# Patient Record
Sex: Male | Born: 1960 | Race: Black or African American | Hispanic: No | Smoking: Current some day smoker
Health system: Southern US, Community
[De-identification: ages and names within clinical notes are randomized; demographics above are authoritative.]

## PROBLEM LIST (undated history)

## (undated) ENCOUNTER — Ambulatory Visit (HOSPITAL_COMMUNITY): Admission: EM

## (undated) DIAGNOSIS — Z85038 Personal history of other malignant neoplasm of large intestine: Secondary | ICD-10-CM

## (undated) DIAGNOSIS — E7800 Pure hypercholesterolemia, unspecified: Secondary | ICD-10-CM

## (undated) DIAGNOSIS — M1990 Unspecified osteoarthritis, unspecified site: Secondary | ICD-10-CM

## (undated) DIAGNOSIS — H409 Unspecified glaucoma: Secondary | ICD-10-CM

## (undated) DIAGNOSIS — F1910 Other psychoactive substance abuse, uncomplicated: Secondary | ICD-10-CM

## (undated) DIAGNOSIS — E119 Type 2 diabetes mellitus without complications: Secondary | ICD-10-CM

## (undated) DIAGNOSIS — C189 Malignant neoplasm of colon, unspecified: Secondary | ICD-10-CM

## (undated) DIAGNOSIS — I10 Essential (primary) hypertension: Secondary | ICD-10-CM

## (undated) DIAGNOSIS — H269 Unspecified cataract: Secondary | ICD-10-CM

## (undated) DIAGNOSIS — T7840XA Allergy, unspecified, initial encounter: Secondary | ICD-10-CM

## (undated) HISTORY — DX: Allergy, unspecified, initial encounter: T78.40XA

## (undated) HISTORY — PX: KNEE ARTHROSCOPY: SUR90

## (undated) HISTORY — DX: Personal history of other malignant neoplasm of large intestine: Z85.038

## (undated) HISTORY — DX: Unspecified cataract: H26.9

## (undated) HISTORY — DX: Other psychoactive substance abuse, uncomplicated: F19.10

## (undated) HISTORY — DX: Unspecified glaucoma: H40.9

## (undated) HISTORY — PX: COLON SURGERY: SHX602

## (undated) HISTORY — PX: OTHER SURGICAL HISTORY: SHX169

---

## 2002-12-24 DIAGNOSIS — C189 Malignant neoplasm of colon, unspecified: Secondary | ICD-10-CM | POA: Insufficient documentation

## 2002-12-24 DIAGNOSIS — Z85038 Personal history of other malignant neoplasm of large intestine: Secondary | ICD-10-CM

## 2002-12-24 HISTORY — DX: Personal history of other malignant neoplasm of large intestine: Z85.038

## 2008-07-16 ENCOUNTER — Emergency Department (HOSPITAL_COMMUNITY): Admission: EM | Admit: 2008-07-16 | Discharge: 2008-07-16 | Payer: Self-pay | Admitting: Emergency Medicine

## 2008-10-31 ENCOUNTER — Emergency Department (HOSPITAL_COMMUNITY): Admission: EM | Admit: 2008-10-31 | Discharge: 2008-10-31 | Payer: Self-pay | Admitting: Emergency Medicine

## 2008-11-09 ENCOUNTER — Ambulatory Visit: Payer: Self-pay | Admitting: Nurse Practitioner

## 2008-11-09 DIAGNOSIS — D649 Anemia, unspecified: Secondary | ICD-10-CM | POA: Insufficient documentation

## 2008-11-09 DIAGNOSIS — B351 Tinea unguium: Secondary | ICD-10-CM | POA: Insufficient documentation

## 2008-11-09 DIAGNOSIS — E119 Type 2 diabetes mellitus without complications: Secondary | ICD-10-CM | POA: Insufficient documentation

## 2008-11-09 DIAGNOSIS — I10 Essential (primary) hypertension: Secondary | ICD-10-CM | POA: Insufficient documentation

## 2008-11-09 LAB — CONVERTED CEMR LAB
ALT: 14 units/L (ref 0–53)
AST: 14 units/L (ref 0–37)
Albumin: 4.4 g/dL (ref 3.5–5.2)
Alkaline Phosphatase: 72 units/L (ref 39–117)
BUN: 24 mg/dL — ABNORMAL HIGH (ref 6–23)
Basophils Absolute: 0 10*3/uL (ref 0.0–0.1)
Basophils Relative: 1 % (ref 0–1)
Blood Glucose, Fingerstick: 132
CO2: 22 meq/L (ref 19–32)
Calcium: 9.6 mg/dL (ref 8.4–10.5)
Chloride: 104 meq/L (ref 96–112)
Creatinine, Ser: 1.42 mg/dL (ref 0.40–1.50)
Eosinophils Absolute: 0.2 10*3/uL (ref 0.0–0.7)
Eosinophils Relative: 3 % (ref 0–5)
Glucose, Bld: 107 mg/dL — ABNORMAL HIGH (ref 70–99)
HCT: 38 % — ABNORMAL LOW (ref 39.0–52.0)
Hemoglobin: 12.3 g/dL — ABNORMAL LOW (ref 13.0–17.0)
Hgb A1c MFr Bld: 7.1 %
Lymphocytes Relative: 41 % (ref 12–46)
Lymphs Abs: 2.3 10*3/uL (ref 0.7–4.0)
MCHC: 32.4 g/dL (ref 30.0–36.0)
MCV: 81.4 fL (ref 78.0–100.0)
Monocytes Absolute: 0.4 10*3/uL (ref 0.1–1.0)
Monocytes Relative: 7 % (ref 3–12)
Neutro Abs: 2.7 10*3/uL (ref 1.7–7.7)
Neutrophils Relative %: 49 % (ref 43–77)
Platelets: 294 10*3/uL (ref 150–400)
Potassium: 4.3 meq/L (ref 3.5–5.3)
RBC: 4.67 M/uL (ref 4.22–5.81)
RDW: 14.9 % (ref 11.5–15.5)
Sodium: 141 meq/L (ref 135–145)
TSH: 1.002 microintl units/mL (ref 0.350–4.50)
Total Bilirubin: 0.4 mg/dL (ref 0.3–1.2)
Total Protein: 7.4 g/dL (ref 6.0–8.3)
WBC: 5.6 10*3/uL (ref 4.0–10.5)

## 2008-11-22 ENCOUNTER — Ambulatory Visit: Payer: Self-pay | Admitting: *Deleted

## 2008-12-08 ENCOUNTER — Ambulatory Visit: Payer: Self-pay | Admitting: Nurse Practitioner

## 2008-12-08 DIAGNOSIS — K219 Gastro-esophageal reflux disease without esophagitis: Secondary | ICD-10-CM | POA: Insufficient documentation

## 2008-12-08 DIAGNOSIS — F528 Other sexual dysfunction not due to a substance or known physiological condition: Secondary | ICD-10-CM | POA: Insufficient documentation

## 2008-12-08 LAB — CONVERTED CEMR LAB
Bilirubin Urine: NEGATIVE
Blood Glucose, AC Bkfst: 94 mg/dL
Blood in Urine, dipstick: NEGATIVE
Glucose, Urine, Semiquant: NEGATIVE
Ketones, urine, test strip: NEGATIVE
Nitrite: NEGATIVE
Protein, U semiquant: NEGATIVE
Specific Gravity, Urine: 1.02
Urobilinogen, UA: 0.2
pH: 5

## 2008-12-10 ENCOUNTER — Encounter (INDEPENDENT_AMBULATORY_CARE_PROVIDER_SITE_OTHER): Payer: Self-pay | Admitting: Nurse Practitioner

## 2008-12-10 DIAGNOSIS — E291 Testicular hypofunction: Secondary | ICD-10-CM | POA: Insufficient documentation

## 2008-12-10 LAB — CONVERTED CEMR LAB
Cholesterol: 198 mg/dL (ref 0–200)
HDL: 55 mg/dL (ref >39)
LDL Cholesterol: 120 mg/dL — ABNORMAL HIGH (ref 0–99)
Microalb, Ur: 0.46 mg/dL (ref 0.00–1.89)
PSA: 0.53 ng/mL (ref 0.10–4.00)
Sex Hormone Binding: 12 nmol/L — ABNORMAL LOW (ref 13–71)
Testosterone Free: 68.3 pg/mL (ref 47.0–244.0)
Testosterone-% Free: 3 % — ABNORMAL HIGH (ref 1.6–2.9)
Testosterone: 226.57 ng/dL — ABNORMAL LOW (ref 350–890)
Total CHOL/HDL Ratio: 3.6
Triglycerides: 114 mg/dL (ref <150)
VLDL: 23 mg/dL (ref 0–40)

## 2008-12-30 ENCOUNTER — Ambulatory Visit: Payer: Self-pay | Admitting: Nurse Practitioner

## 2008-12-31 DIAGNOSIS — E7800 Pure hypercholesterolemia, unspecified: Secondary | ICD-10-CM | POA: Insufficient documentation

## 2008-12-31 LAB — CONVERTED CEMR LAB
ALT: 16 units/L (ref 0–53)
AST: 12 units/L (ref 0–37)
Albumin: 4.3 g/dL (ref 3.5–5.2)
Alkaline Phosphatase: 75 units/L (ref 39–117)
Basophils Absolute: 0 10*3/uL (ref 0.0–0.1)
Basophils Relative: 1 % (ref 0–1)
Bilirubin, Direct: 0.1 mg/dL (ref 0.0–0.3)
Eosinophils Absolute: 0.1 10*3/uL (ref 0.0–0.7)
Eosinophils Relative: 2 % (ref 0–5)
HCT: 34.9 % — ABNORMAL LOW (ref 39.0–52.0)
Hemoglobin: 11.5 g/dL — ABNORMAL LOW (ref 13.0–17.0)
Indirect Bilirubin: 0.2 mg/dL (ref 0.0–0.9)
Lymphocytes Relative: 41 % (ref 12–46)
Lymphs Abs: 2.6 10*3/uL (ref 0.7–4.0)
MCHC: 33 g/dL (ref 30.0–36.0)
MCV: 78.6 fL (ref 78.0–100.0)
Monocytes Absolute: 0.5 10*3/uL (ref 0.1–1.0)
Monocytes Relative: 8 % (ref 3–12)
Neutro Abs: 3.1 10*3/uL (ref 1.7–7.7)
Neutrophils Relative %: 48 % (ref 43–77)
Platelets: 297 10*3/uL (ref 150–400)
RBC: 4.44 M/uL (ref 4.22–5.81)
RDW: 15 % (ref 11.5–15.5)
Total Bilirubin: 0.3 mg/dL (ref 0.3–1.2)
Total Protein: 7.1 g/dL (ref 6.0–8.3)
WBC: 6.4 10*3/uL (ref 4.0–10.5)

## 2009-01-07 ENCOUNTER — Encounter (INDEPENDENT_AMBULATORY_CARE_PROVIDER_SITE_OTHER): Payer: Self-pay | Admitting: Nurse Practitioner

## 2009-01-11 ENCOUNTER — Telehealth (INDEPENDENT_AMBULATORY_CARE_PROVIDER_SITE_OTHER): Payer: Self-pay | Admitting: Nurse Practitioner

## 2009-02-24 ENCOUNTER — Telehealth (INDEPENDENT_AMBULATORY_CARE_PROVIDER_SITE_OTHER): Payer: Self-pay | Admitting: Nurse Practitioner

## 2009-03-24 ENCOUNTER — Ambulatory Visit: Payer: Self-pay | Admitting: Nurse Practitioner

## 2009-03-24 LAB — CONVERTED CEMR LAB
Blood Glucose, Fingerstick: 156
Cholesterol, target level: 200 mg/dL
HDL goal, serum: 40 mg/dL
Hgb A1c MFr Bld: 7.7 %
LDL Goal: 100 mg/dL

## 2009-03-31 ENCOUNTER — Encounter (INDEPENDENT_AMBULATORY_CARE_PROVIDER_SITE_OTHER): Payer: Self-pay | Admitting: Nurse Practitioner

## 2009-05-05 ENCOUNTER — Ambulatory Visit: Payer: Self-pay | Admitting: Nurse Practitioner

## 2009-05-06 LAB — CONVERTED CEMR LAB: Testosterone: 265.28 ng/dL — ABNORMAL LOW (ref 350–890)

## 2009-05-16 ENCOUNTER — Ambulatory Visit: Payer: Self-pay | Admitting: Nurse Practitioner

## 2009-07-01 ENCOUNTER — Telehealth (INDEPENDENT_AMBULATORY_CARE_PROVIDER_SITE_OTHER): Payer: Self-pay | Admitting: Nurse Practitioner

## 2009-07-29 ENCOUNTER — Telehealth (INDEPENDENT_AMBULATORY_CARE_PROVIDER_SITE_OTHER): Payer: Self-pay | Admitting: Nurse Practitioner

## 2009-08-12 ENCOUNTER — Ambulatory Visit: Payer: Self-pay | Admitting: Nurse Practitioner

## 2009-08-12 LAB — CONVERTED CEMR LAB: Blood Glucose, Fingerstick: 132

## 2009-08-15 LAB — CONVERTED CEMR LAB
ALT: 20 units/L (ref 0–53)
AST: 18 units/L (ref 0–37)
Albumin: 4.5 g/dL (ref 3.5–5.2)
Alkaline Phosphatase: 90 units/L (ref 39–117)
BUN: 20 mg/dL (ref 6–23)
CO2: 23 meq/L (ref 19–32)
Calcium: 9.6 mg/dL (ref 8.4–10.5)
Chloride: 104 meq/L (ref 96–112)
Cholesterol: 158 mg/dL (ref 0–200)
Creatinine, Ser: 1.17 mg/dL (ref 0.40–1.50)
Glucose, Bld: 130 mg/dL — ABNORMAL HIGH (ref 70–99)
HDL: 42 mg/dL (ref >39)
Hgb A1c MFr Bld: 7.2 % — ABNORMAL HIGH (ref 4.6–6.1)
LDL Cholesterol: 100 mg/dL — ABNORMAL HIGH (ref 0–99)
Potassium: 4.4 meq/L (ref 3.5–5.3)
Sodium: 140 meq/L (ref 135–145)
Total Bilirubin: 0.5 mg/dL (ref 0.3–1.2)
Total CHOL/HDL Ratio: 3.8
Total Protein: 7.7 g/dL (ref 6.0–8.3)
Triglycerides: 82 mg/dL (ref <150)
VLDL: 16 mg/dL (ref 0–40)

## 2009-08-16 ENCOUNTER — Encounter (INDEPENDENT_AMBULATORY_CARE_PROVIDER_SITE_OTHER): Payer: Self-pay | Admitting: Nurse Practitioner

## 2009-08-22 LAB — CONVERTED CEMR LAB: Testosterone: 240.63 ng/dL — ABNORMAL LOW (ref 350–890)

## 2009-08-25 ENCOUNTER — Telehealth (INDEPENDENT_AMBULATORY_CARE_PROVIDER_SITE_OTHER): Payer: Self-pay | Admitting: Nurse Practitioner

## 2009-09-01 ENCOUNTER — Encounter (INDEPENDENT_AMBULATORY_CARE_PROVIDER_SITE_OTHER): Payer: Self-pay | Admitting: *Deleted

## 2009-09-14 ENCOUNTER — Telehealth (INDEPENDENT_AMBULATORY_CARE_PROVIDER_SITE_OTHER): Payer: Self-pay | Admitting: Nurse Practitioner

## 2009-09-27 ENCOUNTER — Telehealth (INDEPENDENT_AMBULATORY_CARE_PROVIDER_SITE_OTHER): Payer: Self-pay | Admitting: Nurse Practitioner

## 2009-11-21 ENCOUNTER — Telehealth (INDEPENDENT_AMBULATORY_CARE_PROVIDER_SITE_OTHER): Payer: Self-pay | Admitting: Nurse Practitioner

## 2010-02-03 ENCOUNTER — Ambulatory Visit: Payer: Self-pay | Admitting: Nurse Practitioner

## 2010-02-03 LAB — CONVERTED CEMR LAB
ALT: 23 units/L (ref 0–53)
AST: 16 units/L (ref 0–37)
Albumin: 4.5 g/dL (ref 3.5–5.2)
Alkaline Phosphatase: 94 units/L (ref 39–117)
BUN: 23 mg/dL (ref 6–23)
Basophils Absolute: 0 10*3/uL (ref 0.0–0.1)
Basophils Relative: 0 % (ref 0–1)
Bilirubin Urine: NEGATIVE
Blood Glucose, Fingerstick: 112
Blood in Urine, dipstick: NEGATIVE
CO2: 26 meq/L (ref 19–32)
Calcium: 9.6 mg/dL (ref 8.4–10.5)
Chloride: 103 meq/L (ref 96–112)
Cholesterol: 166 mg/dL (ref 0–200)
Creatinine, Ser: 1.36 mg/dL (ref 0.40–1.50)
Eosinophils Absolute: 0.2 10*3/uL (ref 0.0–0.7)
Eosinophils Relative: 2 % (ref 0–5)
Glucose, Bld: 104 mg/dL — ABNORMAL HIGH (ref 70–99)
Glucose, Urine, Semiquant: NEGATIVE
HCT: 40.9 % (ref 39.0–52.0)
HDL: 40 mg/dL (ref >39)
Hemoglobin: 13.2 g/dL (ref 13.0–17.0)
Hgb A1c MFr Bld: 7.4 % — ABNORMAL HIGH (ref 4.6–6.1)
Ketones, urine, test strip: NEGATIVE
LDL Cholesterol: 109 mg/dL — ABNORMAL HIGH (ref 0–99)
Lymphocytes Relative: 27 % (ref 12–46)
Lymphs Abs: 2.3 10*3/uL (ref 0.7–4.0)
MCHC: 32.3 g/dL (ref 30.0–36.0)
MCV: 82.6 fL (ref 78.0–100.0)
Microalb, Ur: 0.5 mg/dL (ref 0.00–1.89)
Monocytes Absolute: 0.5 10*3/uL (ref 0.1–1.0)
Monocytes Relative: 6 % (ref 3–12)
Neutro Abs: 5.5 10*3/uL (ref 1.7–7.7)
Neutrophils Relative %: 64 % (ref 43–77)
Nitrite: NEGATIVE
PSA: 0.67 ng/mL (ref 0.10–4.00)
Platelets: 318 10*3/uL (ref 150–400)
Potassium: 4.5 meq/L (ref 3.5–5.3)
Protein, U semiquant: NEGATIVE
RBC: 4.95 M/uL (ref 4.22–5.81)
RDW: 16.3 % — ABNORMAL HIGH (ref 11.5–15.5)
Sex Hormone Binding: 24 nmol/L (ref 13–71)
Sodium: 141 meq/L (ref 135–145)
Specific Gravity, Urine: 1.015
TSH: 0.463 microintl units/mL (ref 0.350–4.500)
Testosterone Free: 67.6 pg/mL (ref 47.0–244.0)
Testosterone-% Free: 2.3 % (ref 1.6–2.9)
Testosterone: 290.03 ng/dL — ABNORMAL LOW (ref 350–890)
Total Bilirubin: 0.5 mg/dL (ref 0.3–1.2)
Total CHOL/HDL Ratio: 4.2
Total Protein: 7.4 g/dL (ref 6.0–8.3)
Triglycerides: 83 mg/dL (ref <150)
Urobilinogen, UA: 0.2
VLDL: 17 mg/dL (ref 0–40)
WBC Urine, dipstick: NEGATIVE
WBC: 8.6 10*3/uL (ref 4.0–10.5)
pH: 5

## 2010-02-20 ENCOUNTER — Telehealth (INDEPENDENT_AMBULATORY_CARE_PROVIDER_SITE_OTHER): Payer: Self-pay | Admitting: Nurse Practitioner

## 2010-05-05 ENCOUNTER — Telehealth (INDEPENDENT_AMBULATORY_CARE_PROVIDER_SITE_OTHER): Payer: Self-pay | Admitting: Nurse Practitioner

## 2010-06-28 ENCOUNTER — Telehealth (INDEPENDENT_AMBULATORY_CARE_PROVIDER_SITE_OTHER): Payer: Self-pay | Admitting: Nurse Practitioner

## 2010-06-30 ENCOUNTER — Ambulatory Visit: Payer: Self-pay | Admitting: Nurse Practitioner

## 2010-07-03 LAB — CONVERTED CEMR LAB: Hgb A1c MFr Bld: 8.2 % — ABNORMAL HIGH (ref <5.7)

## 2010-09-04 ENCOUNTER — Telehealth (INDEPENDENT_AMBULATORY_CARE_PROVIDER_SITE_OTHER): Payer: Self-pay | Admitting: Nurse Practitioner

## 2010-09-05 ENCOUNTER — Telehealth (INDEPENDENT_AMBULATORY_CARE_PROVIDER_SITE_OTHER): Payer: Self-pay | Admitting: Nurse Practitioner

## 2011-01-23 NOTE — Progress Notes (Signed)
Summary: NEEDS VIAGRA REFILLED  Phone Note Call from Patient Call back at Home Phone [phone]   Reason for Call: Refill Medication Summary of Call: [name] PT. MR Kwasnik SAYS THAT HE NEEDS A REFILL ON HIS VIAGRA, AND WANTS TO KNOW IF HE CAN PICK THE SCRIPT UP AROUND NEXT FRIDAY. Initial call taken by: Leodis Rains,  May 05, 2010 11:34 AM  Follow-up for Phone Call        forward to N. Daphine Deutscher, FNP Follow-up by: Levon Hedger,  May 05, 2010 2:30 PM  Additional Follow-up for Phone Call Additional follow up Details #1::        looks like Viagra was refilled on 05/05/2010 by [facility] and sent to [facility] pharmacy.  Additional Follow-up by: Lehman Prom FNP,  May 08, 2010 8:34 AM    Additional Follow-up for Phone Call Additional follow up Details #2::    pt informed of above information. Follow-up by: Levon Hedger,  May 09, 2010 5:08 PM

## 2011-01-23 NOTE — Miscellaneous (Signed)
Summary: Med change - Androgel  Clinical Lists Changes  Medications: Changed medication from ANDRODERM 5 MG/24HR PT24 (TESTOSTERONE) apply patch nightly to clean dry area to ANDROGEL 50 MG/5GM GEL (TESTOSTERONE) Apply daily to clean, dry, intact skin of  shoudlers, upper arms or abdomen - Signed Rx of ANDROGEL 50 MG/5GM GEL (TESTOSTERONE) Apply daily to clean, dry, intact skin of  shoudlers, upper arms or abdomen;  #1 month qs x 5;  Signed;  Entered by: Lehman Prom FNP;  Authorized by: Lehman Prom FNP;  Method used: Printed then faxed to [facility], [address], [place], [place]  [address], Ph: [phone], Fax: [phone]    Prescriptions: ANDROGEL 50 MG/5GM GEL (TESTOSTERONE) Apply daily to clean, dry, intact skin of  shoudlers, upper arms or abdomen  #1 month qs x 5   Entered and Authorized by:   Lehman Prom FNP   Signed by:   Lehman Prom FNP on 01/07/2009   Method used:   Printed then faxed to ...       [facility] (retail)       [address] [place], [place]  [address]       Ph: [phone]       Fax: [phone]   RxID:   [id number]

## 2011-01-23 NOTE — Progress Notes (Signed)
Summary: Refills of Viagra/cialis  Phone Note Call from Patient   Reason for Call: Refill Medication Complaint: Cough/Sore throat Summary of Call: NEEDS HIS CIALIS CALLEDINTO DeWitt CALLED IN TO [facility]//PHONE [phone] Initial call taken by: Arta Bruce,  September 04, 2010 8:15 AM  Follow-up for Phone Call        viagra and pravastatin  sent to [facility] pharmacy as per request received from the pharmacy notify pt Follow-up by: Lehman Prom FNP,  September 04, 2010 6:49 PM  Additional Follow-up for Phone Call Additional follow up Details #1::        pt is aware.Marland KitchenMarland KitchenWeyman Croon says [facility] doesnt care viagra... pt wants the  cilas  to go to [facility] on [address]. pt says he doesnt need that many pills per month Additional Follow-up by: [place] Shannon,  September 05, 2010 12:47 PM    Additional Follow-up for Phone Call Additional follow up Details #2::    Call pharmacy and see if they have viagra for this pt.  they sent a refill request for viagra and that is why i sent the refill to them if they have it, call pt back and let him know. he understands that they are using a supply from pt assistance and when that supply completed then he would need an Rx sent to [facility] but as long as [facility] has some viagra he can  use this. He can't get both so staff here needs to confirm Follow-up by: Lehman Prom FNP,  September 05, 2010 6:32 PM  Additional Follow-up for Phone Call Additional follow up Details #3:: Details for Additional Follow-up Action Taken: Spoke with Danella Deis at [facility] -- they no longer have the viagra program, so he needs to get his ED meds elsewhere.  Dutch Quint RN  September 06, 2010 9:58 AM  Ok. I just looked at [facility] list and levitra (which is another erectile dysfunction med like viagra and cialis) is available there for $9.  If he would like to try that then I will print Rx for Levitra (in basket).  If he wants cialis then will do that.   he will get one or the other but just though he might like to try the levitra for cost sake n.martin,fnp September 06, 2010  10:07 AM  Pt. says that he'd like to try the Levitra.  He uses [facility] on [address]  Dutch Quint RN  September 06, 2010 10:30 AM     New/Updated Medications: LEVITRA 20 MG TABS (VARDENAFIL HCL) One tablet by mouth 30 minutes before sexual activity Prescriptions: LEVITRA 20 MG TABS (VARDENAFIL HCL) One tablet by mouth 30 minutes before sexual activity  #10 x 0   Entered and Authorized by:   Lehman Prom FNP   Signed by:   Lehman Prom FNP on 09/06/2010   Method used:   Printed then faxed to ...       [facility] [address] [phone]* (retail)       [address]       [place], [place]  [address]       Ph: [phone]       Fax: [phone]   RxID:   [id number] PRAVACHOL 20 MG TABS (PRAVASTATIN SODIUM) 1 tablet by mouth nightly for cholesterol  #30 x 5   Entered and Authorized by:   Lehman Prom FNP   Signed by:   Lehman Prom FNP on 09/04/2010   Method used:   Faxed to .Marland KitchenMarland Kitchen  [facility] (retail)       [address] [place], [place]  [address]       Ph: [phone]       Fax: [phone]   RxID:   [id number] VIAGRA 100 MG TABS (SILDENAFIL CITRATE) 1 tablet by mouth 30 minutes before sexual activity  #10 x 0   Entered and Authorized by:   Lehman Prom FNP   Signed by:   Lehman Prom FNP on 09/04/2010   Method used:   Faxed to ...       [facility] (retail)       [address] [place], [place]  [address]       Ph: [phone]       Fax: [phone]   RxID:   [id number]

## 2011-01-23 NOTE — Assessment & Plan Note (Signed)
Summary: Diabetes/HTN   Vital Signs:  Patient profile:   50 year old male Weight:      268.5 pounds BMI:     37.06 BSA:     2.40 Temp:     97.6 degrees F oral Pulse rate:   80 / minute Pulse rhythm:   regular Resp:     20 per minute BP sitting:   150 / 98  (left arm) Cuff size:   large  Vitals Entered By: Levon Hedger (February 03, 2010 9:28 AM) CC: Hypertension Management, Lipid Management Is Patient Diabetic? Yes Pain Assessment Patient in pain? no      CBG Result 112 CBG Device ID b  Does patient need assistance? Functional Status Self care Ambulation Normal   CC:  Hypertension Management and Lipid Management.  History of Present Illness:  Pt into the office for diabetes follow-up.  Diabetes - pt is taking meds as ordered. No consecutive blood sugar checks but he does have a meter  medications present with pt in office  no acute problems today  Diabetes Management History:      The patient is a 50 years old male who comes in for evaluation of Type 2 Diabetes Mellitus.  He has not been enrolled in the "Diabetic Education Program".  He states understanding of dietary principles and is following his diet appropriately.  No sensory loss is reported.  Self foot exams are not being performed.  He is checking home blood sugars.  He says that he is not exercising regularly.        Symptoms which suggest diabetic complications include sexual dysfunction.  Other questions/concerns include: Pt takes viagra as needed. he also has low testosterone but has not yet received his androgel.        His home blood sugars include fasting blood sugars: highest: 194, lowest: 94.    Hypertension History:      He denies headache, chest pain, and palpitations.  He notes no problems with any antihypertensive medication side effects.  Pt reports that he is taking his blood pressure medication as ordered.        Positive major cardiovascular risk factors include male age 81 years old or  older, diabetes, hyperlipidemia, hypertension, and current tobacco user.  Negative major cardiovascular risk factors include negative family history for ischemic heart disease.        Further assessment for target organ damage reveals no history of ASHD, cardiac end-organ damage (CHF/LVH), stroke/TIA, peripheral vascular disease, renal insufficiency, or hypertensive retinopathy.    Lipid Management History:      Positive NCEP/ATP III risk factors include male age 40 years old or older, diabetes, current tobacco user, and hypertension.  Negative NCEP/ATP III risk factors include no family history for ischemic heart disease, no ASHD (atherosclerotic heart disease), no prior stroke/TIA, no peripheral vascular disease, and no history of aortic aneurysm.        The patient states that he knows about the "Therapeutic Lifestyle Change" diet.  He expresses no side effects from his lipid-lowering medication.  The patient denies any symptoms to suggest myopathy or liver disease.     Habits & Providers  Alcohol-Tobacco-Diet     Alcohol drinks/day: 0     Tobacco Status: current     Tobacco Counseling: to quit use of tobacco products     Cigarette Packs/Day: 0.5     Year Started: 2010     Year Quit: 08/2007  Exercise-Depression-Behavior     Does Patient Exercise:  no     Depression Counseling: not indicated; screening negative for depression     Drug Use: past     Seat Belt Use: 100     Sun Exposure: occasionally  Medications Prior to Update: 1)  Metformin Hcl 1000 Mg Tabs (Metformin Hcl) .... One Tablet By Mouth Two Times A Day For Blood Sugar 2)  Zestoretic 20-12.5 Mg Tabs (Lisinopril-Hydrochlorothiazide) .... One Tablet By Mouth Daily For Blood Pressure **note Change in Dose** 3)  Glucometer Elite Classic  Kit (Blood Glucose Monitoring Suppl) .... Dispense Glucometer, Lancets, Test Strips To Check Blood Sugar Daily Dx 250.00 4)  Multivitamins  Tabs (Multiple Vitamin) .[name].. 1 Tablet By Mouth Daily 5)   Nexium 40 Mg Pack (Esomeprazole Magnesium) .[name].. 1 Tablet By Mouth Daily For Stomach 6)  Lamisil 250 Mg Tabs (Terbinafine Hcl) .[name].. 1 Tablet By Mouth Daily For Toenails 7)  Viagra 100 Mg Tabs (Sildenafil Citrate) .[name].. 1 Tablet By Mouth 30 Minutes Before Sexual Activity 8)  Ferrous Sulfate 325 (65 Fe) Mg Tbec (Ferrous Sulfate) .[name].. 1 Tablet By Mouth Daily 9)  Pravachol 20 Mg Tabs (Pravastatin Sodium) .[name].. 1 Tablet By Mouth Nightly For Cholesterol 10)  Androgel 50 Mg/5gm Gel (Testosterone) .... Apply 10gm Daily To Clean, Dry, Intact Skin of  Shoudlers, Upper Arms or Abdomen 11)  Glucotrol Xl 5 Mg Xr24h-Tab (Glipizide) .[name].. 1 Tablet By Mouth Daily For Blood Sugar  Current Medications (verified): 1)  Metformin Hcl 1000 Mg Tabs (Metformin Hcl) .... One Tablet By Mouth Two Times A Day For Blood Sugar 2)  Zestoretic 20-12.5 Mg Tabs (Lisinopril-Hydrochlorothiazide) .... One Tablet By Mouth Daily For Blood Pressure **note Change in Dose** 3)  Glucometer Elite Classic  Kit (Blood Glucose Monitoring Suppl) .... Dispense Glucometer, Lancets, Test Strips To Check Blood Sugar Daily Dx 250.00 4)  Multivitamins  Tabs (Multiple Vitamin) .[name].. 1 Tablet By Mouth Daily 5)  Nexium 40 Mg Pack (Esomeprazole Magnesium) .[name].. 1 Tablet By Mouth Daily For Stomach 6)  Viagra 100 Mg Tabs (Sildenafil Citrate) .[name].. 1 Tablet By Mouth 30 Minutes Before Sexual Activity 7)  Ferrous Sulfate 325 (65 Fe) Mg Tbec (Ferrous Sulfate) .[name].. 1 Tablet By Mouth Daily 8)  Pravachol 20 Mg Tabs (Pravastatin Sodium) .[name].. 1 Tablet By Mouth Nightly For Cholesterol 9)  Androgel 50 Mg/5gm Gel (Testosterone) .... Apply 10gm Daily To Clean, Dry, Intact Skin of  Shoudlers, Upper Arms or Abdomen 10)  Glucotrol Xl 5 Mg Xr24h-Tab (Glipizide) .[name].. 1 Tablet By Mouth Daily For Blood Sugar 11)  Amlodipine Besylate 5 Mg Tabs (Amlodipine Besylate) .... One Tablet By Mouth Daily For Blood Pressure  Allergies (verified): No Known Drug Allergies  Social  History: Drug Use:  past  Review of Systems General:  Denies fever. CV:  Denies chest pain or discomfort and fatigue. Resp:  Denies cough. GI:  Denies abdominal pain, nausea, and vomiting. GU:  Complains of erectile dysfunction.  Physical Exam  General:  alert.   Head:  normocephalic.   Lungs:  normal breath sounds.   Heart:  normal rate and regular rhythm.   Abdomen:  normal bowel sounds.   Msk:  normal ROM.   Neurologic:  alert & oriented X3.   Psych:  Oriented X3.    Diabetes Management Exam:    Foot Exam (with socks and/or shoes not present):       Sensory-Monofilament:          Left foot: normal          Right  foot: normal       Nails:          Left foot: thickened          Right foot: thickened   Impression & Recommendations:  Problem # 1:  DIABETES MELLITUS (ICD-250.00) will check Hbga1c continue current meds for now pt advised to schedule an eye exam. His updated medication list for this problem includes:    Metformin Hcl 1000 Mg Tabs (Metformin hcl) ..... One tablet by mouth two times a day for blood sugar    Zestoretic 20-12.5 Mg Tabs (Lisinopril-hydrochlorothiazide) ..... One tablet by mouth daily for blood pressure **note change in dose**    Glucotrol Xl 5 Mg Xr24h-tab (Glipizide) .[name]... 1 tablet by mouth daily for blood sugar  Orders: T-Comprehensive Metabolic Panel ([address]-60454) T-PSA (09811-91478) Rapid HIV  (92370) T-TSH [phone]) T- Hemoglobin A1C (57846-96295) Capillary Blood Glucose/CBG (28413)  Problem # 2:  HYPERTENSION (ICD-401.9) Will add amlodipine as BP is still not at goal DASH diet His updated medication list for this problem includes:    Zestoretic 20-12.5 Mg Tabs (Lisinopril-hydrochlorothiazide) ..... One tablet by mouth daily for blood pressure **note change in dose**    Amlodipine Besylate 5 Mg Tabs (Amlodipine besylate) ..... One tablet by mouth daily for blood pressure  Orders: T-Comprehensive Metabolic Panel  (24401-02725) T-CBC w/Diff (36644-03474) Rapid HIV  (25956) T-TSH (38756-43329) T-Urine Microalbumin w/creat. ratio [phone]) UA Dipstick w/o Micro (automated)  (81003)  Problem # 3:  PURE HYPERCHOLESTEROLEMIA (ICD-272.0) will check lipids today His updated medication list for this problem includes:    Pravachol 20 Mg Tabs (Pravastatin sodium) .[name]... 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 One tablet by mouth two times a day for blood sugar 2)  Zestoretic 20-12.5 Mg Tabs (Lisinopril-hydrochlorothiazide) .... One tablet by mouth daily for blood pressure **note change in dose** 3)  Glucometer Elite Classic Kit (Blood glucose monitoring suppl) .... Dispense glucometer, lancets, test strips to check blood sugar daily dx 250.00 4)  Multivitamins Tabs (Multiple vitamin) .[name].. 1 tablet by mouth daily 5)  Nexium 40 Mg Pack (Esomeprazole magnesium) .[name].. 1 tablet by mouth daily for stomach 6)  Viagra 100 Mg Tabs (Sildenafil citrate) .[name].. 1 tablet by mouth 30 minutes before sexual activity 7)  Ferrous Sulfate 325 (65 Fe) Mg Tbec (Ferrous sulfate) .[name].. 1 tablet by mouth daily 8)  Pravachol 20 Mg Tabs (Pravastatin sodium) .[name].. 1 tablet by mouth nightly for cholesterol 9)  Androgel 50 Mg/5gm Gel (Testosterone) .... Apply 10gm daily to clean, dry, intact skin of  shoudlers, upper arms or abdomen 10)  Glucotrol Xl 5 Mg Xr24h-tab (Glipizide) .[name].. 1 tablet by mouth daily for blood sugar 11)  Amlodipine Besylate 5 Mg Tabs (Amlodipine besylate) .... One tablet  by mouth daily for blood pressure  Other Orders: Flu Vaccine 55yrs + [phone]) Admin 1st Vaccine ([address]) Admin 1st Vaccine [facility]) [phone])  Diabetes Management Assessment/Plan:      The following lipid goals have been established for the patient: Total cholesterol goal of 200; LDL cholesterol goal of 100; HDL cholesterol goal of 40; Triglyceride goal of 150.  His blood pressure goal is < 130/80.  Hypertension Assessment/Plan:      The patient's hypertensive risk group is category C: Target organ damage and/or diabetes.  His calculated 10 year risk of coronary heart disease is 33 %.  Today's blood pressure is 150/98.  His blood pressure goal is < 130/80.  Lipid Assessment/Plan:      Based on NCEP/ATP III, the patient's risk factor category is "history of diabetes".  The patient's lipid goals are as follows: Total cholesterol goal is 200; LDL cholesterol goal is 100; HDL cholesterol goal is 40; Triglyceride goal is 150.  His LDL cholesterol goal has not been met.      Patient Instructions: 1)  Your blood sugar will be checked today with Hgba1c and you will be notified of the results. 2)  Schedule an eye exam 3)  Follow up in 4 months or sooner for diabetes.  You will need a rectal/prostate exam.  Will discuss aspirin regiment 4)  You have received the flu vaccine today.  It is late in the season to get the vaccine but it may offer you some protection.  It takes about 2 weeks to build up in your system. Prescriptions: AMLODIPINE BESYLATE 5 MG TABS (AMLODIPINE BESYLATE) One tablet by mouth daily for blood pressure  #30 x 5   Entered and Authorized by:   Lehman Prom FNP   Signed by:   Lehman Prom FNP on 02/03/2010   Method used:   Faxed to ...       [facility] (retail)       [address] [place], [place]  [address]       Ph: [phone]       Fax: [phone]   RxID:   [id number] VIAGRA 100 MG TABS (SILDENAFIL CITRATE) 1  tablet by mouth 30 minutes before sexual activity  #10 x 0   Entered and Authorized by:   Lehman Prom FNP   Signed by:   Lehman Prom FNP on 02/03/2010   Method used:   Faxed to ...       [facility] (retail)       [address] [place], [place]  [address]       Ph: [phone]       Fax: [phone]   RxID:   [id number] NEXIUM 40 MG PACK (ESOMEPRAZOLE MAGNESIUM) 1 tablet by mouth daily for stomach  #30 x 5   Entered and Authorized by:   Lehman Prom FNP   Signed by:   Lehman Prom FNP on 02/03/2010   Method used:   Faxed to ...       [facility] - [facility] (retail)       [address] [place], [place]  [address]       Ph: [phone]       Fax: [phone]   RxID:   [id number] MULTIVITAMINS  TABS (MULTIPLE VITAMIN) 1 tablet by mouth daily  #30 x 5   Entered and Authorized by:   Lehman Prom FNP   Signed by:   Lehman Prom FNP on 02/03/2010   Method used:   Faxed to ...       [facility] - [facility] (retail)       [address] [place], [place]  [address]       Ph: [phone]  Fax: [phone]   RxID:   [id number]   Laboratory Results   Urine Tests  Date/Time Received: February 03, 2010 9:49 AM   Routine Urinalysis   Color: lt. yellow Glucose: negative   (Normal Range: Negative) Bilirubin: negative   (Normal Range: Negative) Ketone: negative   (Normal Range: Negative) Spec. Gravity: 1.015   (Normal Range: 1.003-1.035) Blood: negative   (Normal Range: Negative) pH: 5.0   (Normal Range: 5.0-8.0) Protein: negative   (Normal Range: Negative) Urobilinogen: 0.2   (Normal Range: 0-1) Nitrite: negative   (Normal Range: Negative) Leukocyte Esterace: negative   (Normal Range: Negative)     Blood Tests     CBG Random:: 112      Last LDL:                                                 100 (08/12/2009  9:30:00 PM)        Diabetic Foot Exam Foot Inspection Is there a history of a foot ulcer?              No Is there a foot ulcer now?              No Can the patient see the bottom of their feet?          No Are the shoes appropriate in style and fit?          Yes Is there swelling or an abnormal foot shape?          No Are the toenails thick?                Yes Are the toenails ingrown?              No Is there heavy callous build-up?              No Is there a claw toe deformity?                          No Is there elevated skin temperature?            No Is there limited ankle dorsiflexion?            No Is there foot or ankle muscle weakness?            No Do you have pain in calf while walking?           No      Pulse Check          Right Foot          Left Foot Dorsalis Pedis:        2+            2+    10-g (5.07) Semmes-Weinstein Monofilament Test           Right Foot          Left Foot Visual Inspection               Test Control      normal         normal Site 1         normal         normal Site 2         normal  normal Site 3         normal         normal Site 4         normal         normal Site 5         normal         normal Site 6         normal         normal Site 7         normal         normal Site 8         normal         normal Site 9         normal         normal Site 10         normal         normal  Impression      normal         normal   Influenza Vaccine    Vaccine Type: Fluvax 3+    Site: right deltoid    Mfr: Sanofi Pasteur    Dose: 0.5 ml    Route: IM    Given by: Levon Hedger    Exp. Date: 06/22/2010    Lot #: [id number]    VIS given: 07/17/07 version given February 03, 2010.  Flu Vaccine Consent Questions    Do you have a history of severe allergic reactions to this vaccine? no    Any prior history of allergic reactions to egg and/or gelatin? no    Do you have a sensitivity to the preservative Thimersol? no    Do you  have a past history of Guillan-Barre Syndrome? no    Do you currently have an acute febrile illness? no    Have you ever had a severe reaction to latex? no    Vaccine information given and explained to patient? yes   Prevention & Chronic Care Immunizations   Influenza vaccine: Fluvax 3+  (02/03/2010)    Tetanus booster: 12/08/2008: Tdap    Pneumococcal vaccine: Pneumovax  (03/24/2009)  Colorectal Screening   Hemoccult: Not documented    Colonoscopy:  Results: Normal. Hx of colon cancer  (08/10/2009)   Colonoscopy action/deferral: Repeat colonoscopy in 5 years.   (08/10/2009)  Other Screening   PSA: 0.53  (12/08/2008)   PSA ordered.   Smoking status: current  (02/03/2010)  Diabetes Mellitus   HgbA1C: 7.2  (08/12/2009)    Eye exam: Not documented    Foot exam: yes  (02/03/2010)   High risk foot: Not documented   Foot care education: Not documented    Urine microalbumin/creatinine ratio: Not documented  Lipids   Total Cholesterol: 158  (08/12/2009)   Lipid panel action/deferral: Lipid Panel ordered   LDL: 100  (08/12/2009)   LDL Direct: Not documented   HDL: 42  (08/12/2009)   Triglycerides: 82  (08/12/2009)    SGOT (AST): 18  (08/12/2009)   SGPT (ALT): 20  (08/12/2009) CMP ordered    Alkaline phosphatase: 90  (08/12/2009)   Total bilirubin: 0.5  (08/12/2009)  Hypertension   Last Blood Pressure: 150 / 98  (02/03/2010)   Serum creatinine: 1.17  (08/12/2009)   Serum potassium 4.4  (08/12/2009) CMP ordered   Self-Management Support :    Diabetes self-management support: Not documented    Hypertension self-management support: Not documented    Lipid self-management support: Not documented  Appended Document: Diabetes/HTN     Allergies: No Known Drug Allergies   Complete Medication List: 1)  Metformin Hcl 1000 Mg Tabs (Metformin hcl) .... One tablet by mouth two times a day for blood sugar 2)  Zestoretic 20-12.5 Mg Tabs  (Lisinopril-hydrochlorothiazide) .... One tablet by mouth daily for blood pressure **note change in dose** 3)  Glucometer Elite Classic Kit (Blood glucose monitoring suppl) .... Dispense glucometer, lancets, test strips to check blood sugar daily dx 250.00 4)  Multivitamins Tabs (Multiple vitamin) .[name].. 1 tablet by mouth daily 5)  Nexium 40 Mg Pack (Esomeprazole magnesium) .[name].. 1 tablet by mouth daily for stomach 6)  Viagra 100 Mg Tabs (Sildenafil citrate) .[name].. 1 tablet by mouth 30 minutes before sexual activity 7)  Ferrous Sulfate 325 (65 Fe) Mg Tbec (Ferrous sulfate) .[name].. 1 tablet by mouth daily 8)  Pravachol 20 Mg Tabs (Pravastatin sodium) .[name].. 1 tablet by mouth nightly for cholesterol 9)  Androgel 50 Mg/5gm Gel (Testosterone) .... Apply 10gm daily to clean, dry, intact skin of  shoudlers, upper arms or abdomen 10)  Glucotrol Xl 5 Mg Xr24h-tab (Glipizide) .[name].. 1 tablet by mouth daily for blood sugar 11)  Amlodipine Besylate 5 Mg Tabs (Amlodipine besylate) .... One tablet by mouth daily for blood pressure   Laboratory Results  Date/Time Received: February 06, 2010 8:47 AM  Date/Time Reported: February 06, 2010 8:47 AM   Other Tests  Rapid HIV: negative

## 2011-01-23 NOTE — Progress Notes (Signed)
Summary: Lab results  Phone Note Outgoing Call   Summary of Call: labs done during recent office visit show that his testosterone is still low however he reported that he has not been using the androgel as ordered. Also his Hgba1c = 7.4.  I refilled his metformin but he should also be on glucotrol 5mg  by mouth daily.  Is he taking this? last RX done 06/2009 with 5 refills which means he should be out.  If he has been taking this then will need to increase to 10mg  by mouth daily. If he has NOT been taking daily, will need to send rx to his pharmacy so he can take daily in addition to the metformin Initial call taken by: Lehman Prom FNP,  February 20, 2010 8:11 AM  Follow-up for Phone Call        pt informed of above information and says that he is not taking his medication all the time like he should be he is doing better than he was.  He said he has not gotten the androgel yet he will go get it on Friday and says that he has not been taking his glucotrol as he should.  He says that he needs refills on all his meds so that he can pick them up on Friday. Follow-up by: Levon Hedger,  February 20, 2010 8:50 AM

## 2011-01-23 NOTE — Letter (Signed)
Summary: Handout Printed  Printed Handout:  - Diet - Iron Rich 

## 2011-01-23 NOTE — Progress Notes (Signed)
Summary: VIAGRA REFILL  Phone Note Call from Patient Call back at Home Phone [phone]   Reason for Call: Refill Medication Complaint: Abdominal Pain Summary of Call: [name] PT. Kyle Merritt CALLED AND SAYS THAT WE NEED TO SEND A PAPER TO [facility] SO HE CAN GET HIS VIAGRA WHEN HE COMES IN THIS FRIDAY FOR HIS VISIT. Initial call taken by: Leodis Rains,  June 28, 2010 11:23 AM  Follow-up for Phone Call        the pt needs refills from viagra.[name]  June 29, 2010 11:48 AM  forward to N. Daphine Deutscher, fNP last filled 05/05/10  Additional Follow-up for Phone Call Additional follow up Details #1::        viagra refill sent to [facility] pharmacy Additional Follow-up by: Lehman Prom FNP,  June 29, 2010 1:41 PM    Additional Follow-up for Phone Call Additional follow up Details #2::    pt into the office today Follow-up by: Lehman Prom FNP,  June 30, 2010 12:27 PM  New/Updated Medications: ZESTORETIC 20-12.5 MG TABS (LISINOPRIL-HYDROCHLOROTHIAZIDE) One tablet by mouth daily for blood pressure Prescriptions: FERROUS SULFATE 325 (65 FE) MG TBEC (FERROUS SULFATE) 1 tablet by mouth daily  #30 x 5   Entered and Authorized by:   Lehman Prom FNP   Signed by:   Lehman Prom FNP on 06/29/2010   Method used:   Faxed to ...       [facility] (retail)       [address] [place], [place]  [address]       Ph: [phone]       Fax: [phone]   RxID:   [id number] ZESTORETIC 20-12.5 MG TABS (LISINOPRIL-HYDROCHLOROTHIAZIDE) One tablet by mouth daily for blood pressure  #30 x 5   Entered and Authorized by:   Lehman Prom FNP   Signed by:   Lehman Prom FNP on 06/29/2010   Method used:   Faxed to ...       [facility] (retail)       [address] [place], [place]  [address]       Ph: [phone]       Fax: [phone]   RxID:   [id number] VIAGRA 100 MG TABS  (SILDENAFIL CITRATE) 1 tablet by mouth 30 minutes before sexual activity  #10 x 0   Entered and Authorized by:   Lehman Prom FNP   Signed by:   Lehman Prom FNP on 06/29/2010   Method used:   Faxed to ...       [facility] - [facility] (retail)       [address] [place], [place]  [address]       Ph: [phone]       Fax: [phone]   RxID:   [id number]

## 2011-01-23 NOTE — Letter (Signed)
Summary: *HSN Results Follow up  [facility]  [address] [place], [place] [address]   Phone: [phone]  Fax: [phone]      12/10/2008   Kyle Merritt [address] [place], [place]  [address]   Dear  Mr. [name],                            ____S.Drinkard,FNP   ____D. Gore,FNP       ____B. McPherson,MD   ____V. Rankins,MD    ____E. Mulberry,MD    _X___N. Daphine Deutscher, FNP  ____D. Reche Dixon, MD    ____K. Philipp Deputy, MD    ____Other     This letter is to inform you that your recent test(s):  _______Pap Smear    ____X___Lab Test     _______X-ray    _______ is within acceptable limits  _______ requires a medication change  _______ requires a follow-up lab visit  _______ requires a follow-up visit with your provider   Comments:  Labs done during recent office visit does show that your testosterone level is low.  You can contact the office about replacement options.     _________________________________________________________ If you have any questions, please contact our office [phone].                    Sincerely,    Lehman Prom FNP [facility]

## 2011-01-23 NOTE — Progress Notes (Signed)
Summary: REFILL VIAGRA  Phone Note Call from Patient Call back at Home Phone [phone]   Reason for Call: Refill Medication Summary of Call: Kyle Merritt PT. Kyle Merritt IS CALLING IN HIS REQUEST FOR HIS VIAGRA TO BE CALLED INTO [facility]. SO HE CAN PICK UP EVERYTHING ON THE 7th.  HE SAYS THAT HE FINISHED THE MEDICINE FOR HIS FOOT. Initial call taken by: Leodis Rains,  August 25, 2009 10:37 AM  Follow-up for Phone Call        forward to N. Daphine Deutscher, FNP Follow-up by: Levon Hedger,  August 25, 2009 12:26 PM  Additional Follow-up for Phone Call Additional follow up Details #1::        Rx printed and in basket to be faxed to the pharmacy notify pt that he can check with the pharmacy for availability. Additional Follow-up by: Lehman Prom FNP,  August 25, 2009 2:13 PM    Additional Follow-up for Phone Call Additional follow up Details #2::    VIAGRA SCRIPT WAS FAXED TO [facility]. 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       [facility] (retail)       [address] [place], [place]  [address]       Ph: [phone]       Fax: [phone]   RxID:   [id number]

## 2011-01-23 NOTE — Assessment & Plan Note (Signed)
Summary: Diabetes/HTN   Vital Signs:  Patient profile:   50 year old male Weight:      266.9 pounds BMI:     36.84 Temp:     97.1 degrees F oral Pulse rate:   100 / minute Pulse rhythm:   regular Resp:     20 per minute BP sitting:   140 / 98  (left arm) Cuff size:   large  Vitals Entered By: Levon Hedger (June 30, 2010 11:52 AM) CC: follow-up visit DM....pt is fasting today., Hypertension Management, Lipid Management, Abdominal Pain Is Patient Diabetic? Yes Pain Assessment Patient in pain? no       Does patient need assistance? Functional Status Self care Ambulation Normal  Vision Screening:      Vision Comments: 05/2011   CC:  follow-up visit DM....pt is fasting today., Hypertension Management, Lipid Management, and Abdominal Pain.  History of Present Illness:  Pt into the office for diabetes   Pt has medications with him today in office  Diabetes Management History:      The patient is a 50 years old male who comes in for evaluation of Type 2 Diabetes Mellitus.  He has not been enrolled in the "Diabetic Education Program".  He states lack of understanding of dietary principles and is not following his diet appropriately.  No sensory loss is reported.  Self foot exams are not being performed.  He is checking home blood sugars.  He says that he is not exercising regularly.        Hypoglycemic symptoms are not occurring.  No hyperglycemic symptoms are reported.        No changes have been made to his treatment plan since last visit.    Dyspepsia History:      There is a prior history of GERD.  The patient does not have a prior history of documented ulcer disease.  No previous upper endoscopy has been done.    Hypertension History:      He denies headache, chest pain, and palpitations.        Positive major cardiovascular risk factors include male age 37 years old or older, diabetes, hyperlipidemia, hypertension, and current tobacco user.  Negative major  cardiovascular risk factors include negative family history for ischemic heart disease.        Further assessment for target organ damage reveals no history of ASHD, cardiac end-organ damage (CHF/LVH), stroke/TIA, peripheral vascular disease, renal insufficiency, or hypertensive retinopathy.    Lipid Management History:      Positive NCEP/ATP III risk factors include male age 32 years old or older, diabetes, current tobacco user, and hypertension.  Negative NCEP/ATP III risk factors include no family history for ischemic heart disease, no ASHD (atherosclerotic heart disease), no prior stroke/TIA, no peripheral vascular disease, and no history of aortic aneurysm.        The patient states that he knows about the "Therapeutic Lifestyle Change" diet.  The patient does not know about adjunctive measures for cholesterol lowering.  Adjunctive measures started by the patient include weight reduction.  He expresses no side effects from his lipid-lowering medication.  The patient denies any symptoms to suggest myopathy or liver disease.    Diabetic Foot Exam Foot Inspection Is there a history of a foot ulcer?              No Is there a foot ulcer now?              No Can the  patient see the bottom of their feet?          No Are the shoes appropriate in style and fit?          Yes Is there swelling or an abnormal foot shape?          No Are the toenails long?                Yes Are the toenails thick?                Yes Are the toenails ingrown?              No Is there heavy callous build-up?              No Is there pain in the calf muscle (Intermittent claudication) when walking?    NoIs there a claw toe deformity?              No Is there elevated skin temperature?            No Is there limited ankle dorsiflexion?            No Is there foot or ankle muscle weakness?            No  Diabetic Foot Care Education Pulse Check          Right Foot          Left Foot Dorsalis Pedis:        normal             normal    10-g (5.07) Semmes-Weinstein Monofilament Test Performed by: Levon Hedger          Right Foot          Left Foot Visual Inspection                  Medications Prior to Update: 1)  Metformin Hcl 1000 Mg Tabs (Metformin Hcl) .... One Tablet By Mouth Two Times A Day For Blood Sugar 2)  Zestoretic 20-12.5 Mg Tabs (Lisinopril-Hydrochlorothiazide) .... One Tablet By Mouth Daily For Blood Pressure 3)  Glucometer Elite Classic  Kit (Blood Glucose Monitoring Suppl) .... Dispense Glucometer, Lancets, Test Strips To Check Blood Sugar Daily Dx 250.00 4)  Multivitamins  Tabs (Multiple Vitamin) .[name].. 1 Tablet By Mouth Daily 5)  Nexium 40 Mg Pack (Esomeprazole Magnesium) .[name].. 1 Tablet By Mouth Daily For Stomach 6)  Viagra 100 Mg Tabs (Sildenafil Citrate) .[name].. 1 Tablet By Mouth 30 Minutes Before Sexual Activity 7)  Ferrous Sulfate 325 (65 Fe) Mg Tbec (Ferrous Sulfate) .[name].. 1 Tablet By Mouth Daily 8)  Pravachol 20 Mg Tabs (Pravastatin Sodium) .[name].. 1 Tablet By Mouth Nightly For Cholesterol 9)  Androgel 50 Mg/5gm Gel (Testosterone) .... Apply 10gm Daily To Clean, Dry, Intact Skin of  Shoudlers, Upper Arms or Abdomen 10)  Glucotrol Xl 5 Mg Xr24h-Tab (Glipizide) .[name].. 1 Tablet By Mouth Daily For Blood Sugar 11)  Amlodipine Besylate 5 Mg Tabs (Amlodipine Besylate) .... One Tablet By Mouth Daily For Blood Pressure  Allergies (verified): No Known Drug Allergies  Review of Systems General:  Denies fever. CV:  Denies chest pain or discomfort. Resp:  Denies cough. GI:  Denies abdominal pain, nausea, and vomiting. MS:  up to the exam.  Physical Exam  General:  alert.   Head:  normocephalic.   Eyes:  glasses Ears:  ear piercing(s) noted.   Lungs:  normal breath sounds.   Heart:  normal rate  and regular rhythm.   Abdomen:  normal bowel sounds.   Msk:  up to the exam table Neurologic:  alert & oriented X3.    Diabetes Management Exam:    Foot Exam (with socks and/or shoes not  present):       Sensory-Monofilament:          Left foot: normal          Right foot: normal       Nails:          Left foot: thickened          Right foot: thickened    Eye Exam:       Eye Exam done elsewhere          Date: 05/24/2010          Results: normal          Done by: local optho   Impression & Recommendations:  Problem # 1:  DIABETES MELLITUS (ICD-250.00) will check hgba1c today continue current meds His updated medication list for this problem includes:    Metformin Hcl 1000 Mg Tabs (Metformin hcl) ..... One tablet by mouth two times a day for blood sugar    Zestoretic 20-12.5 Mg Tabs (Lisinopril-hydrochlorothiazide) ..... One tablet by mouth daily for blood pressure    Glucotrol Xl 5 Mg Xr24h-tab (Glipizide) .[name]... 1 tablet by mouth daily for blood sugar  Orders: T- Hemoglobin A1C ([address])  Problem # 2:  HYPERTENSION (ICD-401.9)  His updated medication list for this problem includes:    Zestoretic 20-12.5 Mg Tabs (Lisinopril-hydrochlorothiazide) ..... One tablet by mouth daily for blood pressure    Amlodipine Besylate 5 Mg Tabs (Amlodipine besylate) ..... One tablet by mouth daily for blood pressure  Problem # 3:  ANEMIA (ICD-285.9) iron rich food diet given to pt His updated medication list for this problem includes:    Ferrous Sulfate 325 (65 Fe) Mg Tbec (Ferrous sulfate) .[name]... 1 tablet by mouth daily  Problem # 4:  GERD (ICD-530.81)  His updated medication list for this problem includes:    Nexium 40 Mg Pack (Esomeprazole magnesium) .[name]... 1 tablet by mouth daily for stomach  Complete Medication List: 1)  Metformin Hcl 1000 Mg Tabs (Metformin hcl) .... One tablet by mouth two times a day for blood sugar 2)  Zestoretic 20-12.5 Mg Tabs (Lisinopril-hydrochlorothiazide) .... One tablet by mouth daily for blood pressure 3)  Glucometer Elite Classic Kit (Blood glucose monitoring suppl) .... Dispense glucometer, lancets, test strips to check blood sugar daily  dx 250.00 4)  Multivitamins Tabs (Multiple vitamin) .[name].. 1 tablet by mouth daily 5)  Nexium 40 Mg Pack (Esomeprazole magnesium) .[name].. 1 tablet by mouth daily for stomach 6)  Viagra 100 Mg Tabs (Sildenafil citrate) .[name].. 1 tablet by mouth 30 minutes before sexual activity 7)  Ferrous Sulfate 325 (65 Fe) Mg Tbec (Ferrous sulfate) .[name].. 1 tablet by mouth daily 8)  Pravachol 20 Mg Tabs (Pravastatin sodium) .[name].. 1 tablet by mouth nightly for cholesterol 9)  Androgel 50 Mg/5gm Gel (Testosterone) .... Apply 10gm daily to clean, dry, intact skin of  shoudlers, upper arms or abdomen 10)  Glucotrol Xl 5 Mg Xr24h-tab (Glipizide) .[name].. 1 tablet by mouth daily for blood sugar 11)  Amlodipine Besylate 5 Mg Tabs (Amlodipine besylate) .... One tablet by mouth daily for blood pressure  Diabetes Management Assessment/Plan:      The following lipid goals have been established for the patient: Total cholesterol goal of 200; LDL cholesterol goal  of 100; HDL cholesterol goal of 40; Triglyceride goal of 150.  His blood pressure goal is < 130/80.    Hypertension Assessment/Plan:      The patient's hypertensive risk group is category C: Target organ damage and/or diabetes.  His calculated 10 year risk of coronary heart disease is 33 %.  Today's blood pressure is 140/98.  His blood pressure goal is < 130/80.  Lipid Assessment/Plan:      Based on NCEP/ATP III, the patient's risk factor category is "history of diabetes".  The patient's lipid goals are as follows: Total cholesterol goal is 200; LDL cholesterol goal is 100; HDL cholesterol goal is 40; Triglyceride goal is 150.  His LDL cholesterol goal has not been met.     Patient Instructions: 1)  Follow up in 4 months - November for diabetes 2)  Will need rectal and prostate, Hgba1c 3)  will need flu vaccine  Last LDL:                                                 109 (02/03/2010 9:10:00 PM)        Diabetic Foot Exam Pulse Check          Right Foot          Left  Foot Dorsalis Pedis:        normal            normal    10-g (5.07) Semmes-Weinstein Monofilament Test Performed by: Levon Hedger          Right Foot          Left Foot Visual Inspection               Test Control      normal         normal Site 1         normal         normal Site 2         normal         normal Site 3         normal         normal Site 4         normal         normal Site 5         normal         normal Site 6         normal         normal Site 7         normal         normal Site 8         normal         normal Site 9         normal         normal Site 10         normal         normal  Impression      normal         normal  Diabetic Foot Exam Foot Inspection Is there a history of a foot ulcer?              No Is there a foot ulcer now?  No Can the patient see the bottom of their feet?          No Are the shoes appropriate in style and fit?          Yes Is there swelling or an abnormal foot shape?          No Are the toenails long?                Yes Are the toenails thick?                Yes Are the toenails ingrown?              No Is there heavy callous build-up?              No Is there pain in the calf muscle (Intermittent claudication) when walking?    NoIs there a claw toe deformity?              No Is there elevated skin temperature?            No Is there limited ankle dorsiflexion?            No Is there foot or ankle muscle weakness?            No  Diabetic Foot Care Education Pulse Check          Right Foot          Left Foot Dorsalis Pedis:        normal            normal    10-g (5.07) Semmes-Weinstein Monofilament Test Performed by: Levon Hedger          Right Foot          Left Foot Visual Inspection                Prevention & Chronic Care Immunizations   Influenza vaccine: Fluvax 3+  (02/03/2010)    Tetanus booster: 12/08/2008: Tdap    Pneumococcal vaccine: Pneumovax  (03/24/2009)  Colorectal Screening    Hemoccult: Not documented   Hemoccult action/deferral: Refused  (06/30/2010)    Colonoscopy:  Results: Normal. Hx of colon cancer  (08/10/2009)   Colonoscopy action/deferral: Repeat colonoscopy in 5 years.   (08/10/2009)  Other Screening   PSA: 0.67  (02/03/2010)   Smoking status: current  (02/03/2010)   Smoking cessation counseling: yes  (06/30/2010)  Diabetes Mellitus   HgbA1C: 7.4  (02/03/2010)    Eye exam: normal  (05/24/2010)    Foot exam: yes  (06/30/2010)   High risk foot: Not documented   Foot care education: Not documented    Urine microalbumin/creatinine ratio: Not documented  Lipids   Total Cholesterol: 166  (02/03/2010)   Lipid panel action/deferral: Lipid Panel ordered   LDL: 109  (02/03/2010)   LDL Direct: Not documented   HDL: 40  (02/03/2010)   Triglycerides: 83  (02/03/2010)    SGOT (AST): 16  (02/03/2010)   SGPT (ALT): 23  (02/03/2010)   Alkaline phosphatase: 94  (02/03/2010)   Total bilirubin: 0.5  (02/03/2010)  Hypertension   Last Blood Pressure: 140 / 98  (06/30/2010)   Serum creatinine: 1.36  (02/03/2010)   Serum potassium 4.5  (02/03/2010)  Self-Management Support :    Diabetes self-management support: Not documented    Hypertension self-management support: Not documented    Lipid self-management support: Not documented

## 2011-01-23 NOTE — Progress Notes (Signed)
Summary: ?S ABOUT MEDICATIONS  Phone Note Call from Patient Call back at Home Phone [phone]   Summary of Call: Kyle Merritt PT. Kyle Merritt WANTS TO KNOW IF HE IS TO BE TAKING THERAPUTIC TAB AND FERROUS SULFATE, OR JUST ONE OF THEM AND ALSO HE HAS 5 PILLS LEFT ON THE TERBINAFINE FOR HIS TOENAIL AND HE WANTS TO KNOW IF HE NEEDS A REFILL ON IT, HE SAYS HIS TOE STILL LOOKS THE SAME.  Kyle Merritt SAYS THAT HE WENT TO [facility] TO FILL OUT THE PAPER TO GET THE MEDICATION FOR HIS TESTERTONE AND THEY TOLD HIM THEY DID NOT HAVE IT AND THEY WERE GOING TO TRY AND GET IN TOUCH WITH YOU ABOUT IT. Initial call taken by: Leodis Rains,  January 11, 2009 4:18 PM  Follow-up for Phone Call        forward to n. martin,fnp Follow-up by: Levon Hedger,  January 11, 2009 5:07 PM  Additional Follow-up for Phone Call Additional follow up Details #1::        1.see my phone note on 12/30/2008 for the answer regarding MVI and Ferrous sulfate 2. Rx for lamisil (toe nail med printed and in basket) again explain to pt that he will need to take for at LEAST 3 months before he see any improvement.  His liver labs were ok when checked earlier this month so that is why refills were approved 3.  I have sent the new Rx for his testosterone on 01-07-2009 so he should be able to complete the medication assistance paperwork.  Medication will still take about 6-8 weeks to come after he completes the paperwork Additional Follow-up by: Lehman Prom FNP,  January 12, 2009 8:05 AM    Additional Follow-up for Phone Call Additional follow up Details #2::    spoke with pt and informed him of his med refill... Follow-up by: [place] Shannon,  January 12, 2009 12:10 PM    Prescriptions: LAMISIL 250 MG TABS (TERBINAFINE HCL) 1 tablet by mouth daily for toenails  #30 x 2   Entered and Authorized by:   Lehman Prom FNP   Signed by:   Lehman Prom FNP on 01/12/2009   Method used:   Printed then faxed to ...         RxID:    [id number]

## 2011-01-23 NOTE — Progress Notes (Signed)
Summary: med refill   Phone Note Call from Patient   Caller: Patient Summary of Call: pt needs a refill on cialis they [facility] in [facility] viagra at [facility] but they dont carry that anymore.  and he said he doesnt want viagra because it gives him a headache, [facility] [address]   Initial call taken by: Oscar La,  September 05, 2010 9:58 AM  Follow-up for Phone Call        forward to N. Daphine Deutscher, fnp Follow-up by: Levon Hedger,  September 05, 2010 3:01 PM  Additional Follow-up for Phone Call Additional follow up Details #1::        see other phone note addressing this issue no record of pt receiving cialis by this provider so ? refill Additional Follow-up by: Lehman Prom FNP,  September 05, 2010 6:35 PM

## 2011-01-23 NOTE — Progress Notes (Signed)
Summary: REFILL  Phone Note Call from Patient Call back at [facility] Phone [phone]   Summary of Call: PT CALLED STATING THAT HIS ANDROGEL WAS FAXED TO [facility] AND THE RX WAS $260. HE SAID THERE WAS NO WAY HE COULD AFFORD THAT WHAT DO YOU WANT HIM TO DO. IS THIS POSSIBLE THORUGH [facility]?  Initial call taken by: Mikey College CMA,  September 27, 2009 12:21 PM  Follow-up for Phone Call        I knew that the medication would be expense at [facility] (see coversation in previous phone note) Pt is already set up with [facility] to get this medication from [facility] pharmacy but he has chosen not to get if from there because he work scheduled does not allow. so unfortunately I don't have any answers for him, if he wants to get it at a discounted price he will need to go to [facility].  It is not a medication that he has to have but if he does not take it them his impotence and erectile function problem worsens.  So that's an issue for him to decide Follow-up by: Lehman Prom FNP,  September 27, 2009 12:28 PM  Additional Follow-up for Phone Call Additional follow up Details #1::        MR Bernardy SAYS TO TELL YOU TO GO AHEAD AND ORDER THE MEDICINE THAT HE COULDNT GET AT [facility] AND HE NEEDS A REFIL ON VIAGRA. Additional Follow-up by: Leodis Rains,  October 03, 2009 9:33 AM    Additional Follow-up for Phone Call Additional follow up Details #2::    Was pt given info in my previous response?   Is he going to get medications from [facility] pharmacy (will send refills to [facility])  because viagra is going to be expense from [place] as well  the whole reason he needs viagra is because his testosterone is low so will not refill one without the other.  If he can get viagra refilled at [facility] pharmacy then he can get androgel refilled there too n.martin,fnp  October 03, 2009  1:45 PM  Levon Hedger  October 07, 2009 3:01 PM  Pt was given information from previous phone note.  Spoke with pt he is aware and wants to  have medication sent to [facility] pharmacy. I called the pharmacy and they told me medications were ready for pick up for pt and pt is infomed of this.    Additional Follow-up for Phone Call Additional follow up Details #3:: Details for Additional Follow-up Action Taken: great. n.martin,fnp  October 11, 2009  10:47 AM    Prescriptions: ANDROGEL 50 MG/5GM GEL (TESTOSTERONE) Apply 10gm daily to clean, dry, intact skin of  shoudlers, upper arms or abdomen  #30 x 5   Entered and Authorized by:   Lehman Prom FNP   Signed by:   Lehman Prom FNP on 10/03/2009   Method used:   Printed then faxed to ...       [facility] (retail)       [address] [place], [place]  [address]       Ph: [phone]       Fax: [phone]   RxID:   [id number] PRAVACHOL 20 MG TABS (PRAVASTATIN SODIUM) 1 tablet by mouth nightly for cholesterol  #30 x 5   Entered and Authorized by:   Lehman Prom FNP   Signed by:   Lehman Prom FNP on 10/03/2009  Method used:   Faxed to ...       [facility] (retail)       [address] [place], [place]  [address]       Ph: [phone]       Fax: [phone]   RxID:   [id number] VIAGRA 100 MG TABS (SILDENAFIL CITRATE) 1 tablet by mouth 30 minutes before sexual activity  #10 x 0   Entered and Authorized by:   Lehman Prom FNP   Signed by:   Lehman Prom FNP on 10/03/2009   Method used:   Faxed to ...       [facility] (retail)       [address] [place], [place]  [address]       Ph: [phone]       Fax: [phone]   RxID:   [id number]

## 2011-01-23 NOTE — Progress Notes (Signed)
Summary: REFILL ON VIAGRA  Phone Note Call from Patient Call back at [phone]   Reason for Call: Refill Medication Summary of Call: Lyric Hoar PT. MR Westenberger SAYS THAT HE CALLED THIS INTO [facility] LAST WEEK. AND WHEN HE WENT TO PICK UP ALL HIS MEDICATIONS TODAY THE VIAGRA WAS NOT THERE AND WAS TOLD THEY WERE WAITING ON [place] TO SEND IN FOR A REFILL. Initial call taken by: Leodis Rains,  July 01, 2009 3:02 PM  Follow-up for Phone Call        PT CHECKING TO SEE IF WE RECIEVED A REQUEST FOR VIAGRA REFILL. PT ALSO WOULD LIKE TO KNOW IF HE CAN GET MORE THAN ONE REFILL AT A TIME IF POSSIBLE. Follow-up by: Mikey College CMA,  July 04, 2009 9:24 AM  Additional Follow-up for Phone Call Additional follow up Details #1::        I have not recieved a prior request for viagra but rx printed now and is at station Testosterone low and pt should be taking androgel Ask if he is using.  Low testesterone is the reason for his erectile dysfunction Will not give more than 1 refill because if he is using androgel as ordered he should eventually not need viagra because testoterone level will be back to normal Additional Follow-up by: Lehman Prom FNP,  July 04, 2009 10:05 AM    Additional Follow-up for Phone Call Additional follow up Details #2::    Left message on answering machine for pt to return call at [phone]. Follow-up by: Vesta Mixer CMA,  July 06, 2009 9:07 AM  Additional Follow-up for Phone Call Additional follow up Details #3:: Details for Additional Follow-up Action Taken: pt aware and he said he is using the androgel Additional Follow-up by: Vesta Mixer CMA,  July 06, 2009 9:43 AM    Prescriptions: GLUCOTROL XL 5 MG XR24H-TAB (GLIPIZIDE) 1 tablet by mouth daily for blood sugar  #30 x 5   Entered and Authorized by:   Lehman Prom FNP   Signed by:   Lehman Prom FNP on 07/04/2009   Method used:   Printed then faxed to ...       [facility] - [facility]  (retail)       [address] [place], [place]  [address]       Ph: [phone]       Fax: [phone]   RxID:   [id number] FERROUS SULFATE 325 (65 FE) MG TBEC (FERROUS SULFATE) 1 tablet by mouth daily  #30 x 6   Entered and Authorized by:   Lehman Prom FNP   Signed by:   Lehman Prom FNP on 07/04/2009   Method used:   Printed then faxed to ...       [facility] - [facility] (retail)       [address] [place], [place]  [address]       Ph: [phone]       Fax: [phone]   RxID:   [id number] VIAGRA 100 MG TABS (SILDENAFIL CITRATE) 1 tablet by mouth 30 minutes before sexual activity  #10 x 0   Entered and Authorized by:   Lehman Prom FNP   Signed by:   Lehman Prom FNP on 07/04/2009   Method used:   Printed then faxed to ...       [facility] - [facility] (retail)  [address] [place], [place]  [address]       Ph: [phone]       Fax: [phone]   RxID:   [id number]

## 2011-01-23 NOTE — Progress Notes (Signed)
Summary: REFILL  Phone Note Call from Patient Call back at Home Phone [phone]   Reason for Call: Refill Medication Summary of Call: Kyle Merritt PT. Kyle Merritt IS CALLING FOR A REFILL ON HIS VIAGRA. Initial call taken by: Leodis Rains,  November 21, 2009 8:48 AM  Follow-up for Phone Call        forward to Jesse Fall, fnp Follow-up by: Levon Hedger,  November 21, 2009 3:59 PM  Additional Follow-up for Phone Call Additional follow up Details #1::        Viagra refill sent to [facility] pharmacy  notify pt Additional Follow-up by: Lehman Prom FNP,  November 21, 2009 4:53 PM    Additional Follow-up for Phone Call Additional follow up Details #2::    PT INFORMED. Follow-up by: Levon Hedger,  December 02, 2009 11:09 AM  Prescriptions: VIAGRA 100 MG TABS (SILDENAFIL CITRATE) 1 tablet by mouth 30 minutes before sexual activity  #10 x 0   Entered and Authorized by:   Lehman Prom FNP   Signed by:   Lehman Prom FNP on 11/21/2009   Method used:   Faxed to ...       [facility] (retail)       [address] [place], [place]  [address]       Ph: [phone]       Fax: [phone]   RxID:   [id number]

## 2011-01-23 NOTE — Progress Notes (Signed)
Summary: Viagra Refill  Phone Note Call from Patient Call back at Home Phone [phone]   Reason for Call: Refill Medication Summary of Call: Adriannah Steinkamp PT, MR [place]on CALLING IN AHEAD OF TIME SO HE CAN GET HIS VIAGRA BY 08/14 ALONG WITH THE REST OF HIS MEDICATIONS.  Follow-up for Phone Call        forward to N. [name],FNP Follow-up by: Levon Hedger,  July 29, 2009 5:51 PM  Additional Follow-up for Phone Call Additional follow up Details #1::        Rx printed and in basket to fax to [facility] pharmacy notify pt Additional Follow-up by: Lehman Prom FNP,  July 31, 2009 6:07 PM    Additional Follow-up for Phone Call Additional follow up Details #2::    pt notified script faxed to [place] pharm Follow-up by: Gaylyn Cheers RN,  August 02, 2009 10:49 AM  Prescriptions: VIAGRA 100 MG TABS (SILDENAFIL CITRATE) 1 tablet by mouth 30 minutes before sexual activity  #10 x 0   Entered and Authorized by:   Lehman Prom FNP   Signed by:   Lehman Prom FNP on 07/31/2009   Method used:   Printed then faxed to ...       [facility] (retail)       [address] [place], [place]  [address]       Ph: [phone]       Fax: [phone]   RxID:   [id number]

## 2011-01-23 NOTE — Assessment & Plan Note (Signed)
Summary: Complete Physical Exam   Vital Signs:  Patient Profile:   50 Years Old Male Height:     71.50 inches Weight:      274 pounds BMI:     37.82 BSA:     2.43 Temp:     97.5 degrees F oral Pulse rate:   80 / minute Pulse rhythm:   regular Resp:     18 per minute BP sitting:   124 / 80  (right arm) Cuff size:   large  Pt. in pain?   no  Vitals Entered By: Armenia Shannon MA              Is Patient Diabetic? Yes Did you bring your meter with you today? Yes     Chief Complaint:  pt is concerned about having acid reflux.  History of Present Illness:  Patient into the office for complete physcial exam. Pt was seen during initial visit last month.  Anemia - noted during previous labs. No MVI.  Optho -  last check about 1 year ago,  currently wears glasses last 8 months ago  dental - no recent exam  tetanus - unknown over 10 year ago  GERD - Notes that had has some regurgitation of acids +fried foods +spicy foods +obese +soda  Colonscopy - last colonscopy done in 2007.  Hx of colon cancer     Prior Medications Reviewed Using: Medication Bottles  Current Allergies (reviewed today): No known allergies      Review of Systems  General      Denies fever.  Eyes      Denies blurring.  ENT      Denies earache.  CV      Denies chest pain or discomfort.  Resp      Denies cough.  GI      Complains of indigestion.      Denies abdominal pain, nausea, and vomiting.  GU      Complains of erectile dysfunction.      Denies dysuria.  MS      Denies joint pain.  Derm      Denies rash.  Neuro      Denies headaches.  Psych      Denies anxiety and depression.   Physical Exam  General:     alert and overweight-appearing.   Head:     normocephalic.   Eyes:     vision grossly intact, pupils equal, and pupils round.   Ears:     External ear exam shows no significant lesions or deformities.  Otoscopic examination reveals clear canals,  tympanic membranes are intact bilaterally without bulging, retraction, inflammation or discharge. Hearing is grossly normal bilaterally. Nose:     External nasal examination shows no deformity or inflammation. Nasal mucosa are pink and moist without lesions or exudates. Mouth:     Oral mucosa and oropharynx without lesions or exudates.   Neck:     supple.   Chest Wall:     no masses.   Breasts:     no gynecomastia.   Lungs:     normal breath sounds.   Heart:     normal rate and regular rhythm.   Abdomen:     obese non-tender and normal bowel sounds.   Rectal:     no hemorrhoids.   Prostate:     no gland enlargement, no nodules, and no asymmetry.   Msk:     normal ROM.   Pulses:  R radial normal, R dorsalis pedis normal, L radial normal, and L dorsalis pedis normal.   Extremities:     no edema Neurologic:     alert & oriented X3.   Skin:     dry skin Psych:     Oriented X3.      Impression & Recommendations:  Problem # 1:  HEALTH MAINTENANCE EXAM (ICD-V70.0) labs reviewed from previous visit will check additional labs today recommend routine optho and dental guaiac done - negative EKG done Orders: Hemoccult Guaiac-1 spec.(in office) (04540) UA Dipstick w/o Micro (manual) (98119) T-Lipid Profile (14782-95621) T-HIV Antibody  (Reflex) (30865-78469) T-Syphilis Test (RPR) (62952-84132) T-PSA  (44010-27253) EKG w/ Interpretation (93000)   Problem # 2:  ANEMIA (ICD-285.9) will start MVI  Problem # 3:  HYPERTENSION (ICD-401.9) stable during this visit His updated medication list for this problem includes:    Zestoretic 10-12.5 Mg Tabs (Lisinopril-hydrochlorothiazide) .[name]... 1 tablet by mouth daily for blood pressure  Orders: T-Urine Microalbumin w/creat. ratio (66440 / [address])   Problem # 4:  DIABETES MELLITUS (ICD-250.00) stable  Hbga1c slightly elevated on last visit His updated medication list for this problem includes:    Metformin Hcl 500 Mg  Tabs (Metformin hcl) .[name]... 2 tablets by mouth two times a day    Zestoretic 10-12.5 Mg Tabs (Lisinopril-hydrochlorothiazide) .[name]... 1 tablet by mouth daily for blood pressure  Orders: T-Urine Microalbumin w/creat. 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  Needs lft's in 1 months His updated medication list for this problem includes:    Lamisil 250 Mg Tabs (Terbinafine hcl) .[name]... 1 tablet by mouth daily for toenails   Problem # 6:  ERECTILE DYSFUNCTION (ICD-302.72) will give viagra. Orders: T-Testosterone, Free and Total [phone])  His updated medication list for this problem includes:    Viagra 100 Mg Tabs (Sildenafil citrate) .[name]... 1 tablet by mouth 30 minutes before sexual activity   Problem # 7:  COLON CANCER (ICD-153.9) need colonscopy. pt has already recieved information.  Problem # 8:  GERD (ICD-530.81)  His updated medication list for this problem includes:    Nexium 40 Mg Pack (Esomeprazole magnesium) .[name]... 1 tablet by mouth daily for stomach   Complete Medication List: 1)  Metformin Hcl 500 Mg Tabs (Metformin hcl) .... 2 tablets by mouth two times a day 2)  Zestoretic 10-12.5 Mg Tabs (Lisinopril-hydrochlorothiazide) .[name].. 1 tablet by mouth daily for blood pressure 3)  Glucometer Elite Classic Kit (Blood glucose monitoring suppl) .... Dispense glucometer, lancets, test strips to check blood sugar daily dx 250.00 4)  Multivitamins Tabs (Multiple vitamin) .[name].. 1 tablet by mouth daily 5)  Nexium 40 Mg Pack (Esomeprazole magnesium) .[name].. 1 tablet by mouth daily for stomach 6)  Lamisil 250 Mg Tabs (Terbinafine hcl) .[name].. 1 tablet by mouth daily for toenails 7)  Viagra 100 Mg Tabs (Sildenafil citrate) .[name].. 1 tablet by mouth 30 minutes before sexual activity  Other Orders: Flu Vaccine 15yrs + (32355) Admin 1st Vaccine (73220) Tdap => 39yrs IM (25427) Admin of Any Addtl Vaccine (06237)   Patient Instructions:  1)  Review below list of medications 2)  Will give flu vaccine today. 3)  Nurse visit  in 1 month. 4)  Schedule  pnuemovax on the next. (nurse visit) 5)  Lft's and cbc on next visit ( you can get a refill on lamisil if liver ok) 6)  You need a screening colonscopy. 7)  Follow up for office visit  in 3 months for diabetes.   Prescriptions: VIAGRA 100 MG TABS (SILDENAFIL CITRATE) 1 tablet by mouth 30 minutes before sexual activity  #10 x 0   Entered and Authorized by:   Lehman Prom FNP   Signed by:   Lehman Prom FNP on 12/08/2008   Method used:   Print then Give to Patient   RxID:   [id number] LAMISIL 250 MG TABS (TERBINAFINE HCL) 1 tablet by mouth daily for toenails  #30 x 0   Entered and Authorized by:   Lehman Prom FNP   Signed by:   Lehman Prom FNP on 12/08/2008   Method used:   Print then Give to Patient   RxID:   [id number] NEXIUM 40 MG PACK (ESOMEPRAZOLE MAGNESIUM) 1 tablet by mouth daily for stomach  #30 x 5   Entered and Authorized by:   Lehman Prom FNP   Signed by:   Lehman Prom FNP on 12/08/2008   Method used:   Print then Give to Patient   RxID:   [id number] MULTIVITAMINS  TABS (MULTIPLE VITAMIN) 1 tablet by mouth daily  #30 x 5   Entered and Authorized by:   Lehman Prom FNP   Signed by:   Lehman Prom FNP on 12/08/2008   Method used:   Print then Give to Patient   RxID:   [id number]  ] Laboratory Results   Urine Tests  Date/Time Received: December 08, 2008 time   Routine Urinalysis   Color: lt. yellow Glucose: negative   (Normal Range: Negative) Bilirubin: negative   (Normal Range: Negative) Ketone: negative   (Normal Range: Negative) Spec. Gravity: 1.020   (Normal Range: 1.003-1.035) Blood: negative   (Normal Range: Negative) pH: 5.0   (Normal Range: 5.0-8.0) Protein: negative   (Normal Range: Negative) Urobilinogen: 0.2   (Normal Range: 0-1) Nitrite: negative   (Normal Range: Negative) Leukocyte  Esterace: trace   (Normal Range: Negative)     Blood Tests   Date/Time Received: December 08, 2008 11:57 AM  Date/Time Reported: December 08, 2008 11:57 AM   CBG Fasting:: 94  Date/Time Received: December 08, 2008 12:54 PM   Stool - Occult Blood Hemmoccult #1: negative Date: 12/08/2008   Laboratory Results   Urine Tests    Routine Urinalysis   Color: lt. yellow Glucose: negative   (Normal Range: Negative) Bilirubin: negative   (Normal Range: Negative) Ketone: negative   (Normal Range: Negative) Spec. Gravity: 1.020   (Normal Range: 1.003-1.035) Blood: negative   (Normal Range: Negative) pH: 5.0   (Normal Range: 5.0-8.0) Protein: negative   (Normal Range: Negative) Urobilinogen: 0.2   (Normal Range: 0-1) Nitrite: negative   (Normal Range: Negative) Leukocyte Esterace: trace   (Normal Range: Negative)     Blood Tests     CBG Fasting: 94       Tetanus/Td Vaccine    Vaccine Type: Tdap    Site: right deltoid    Mfr: Sanofi Pasteur    Dose: 0.5 ml    Route: IM    Given by: Levon Hedger    Exp. Date: 12/31/2010    Lot #: [id number]    VIS given: 11/11/07 version given December 08, 2008.  Influenza Vaccine    Vaccine Type: Fluvax 3+    Site: left deltoid    Mfr: Sanofi Pasteur    Dose: 0.5 ml    Route: IM    Given by: Levon Hedger    Exp. Date: 06/22/2009    Lot #: [id number]  VIS given: 07/17/07 version given December 08, 2008.  Flu Vaccine Consent Questions    Do you have a history of severe allergic reactions to this vaccine? no    Any prior history of allergic reactions to egg and/or gelatin? no    Do you have a sensitivity to the preservative Thimersol? no    Do you have a past history of Guillan-Barre Syndrome? no    Do you currently have an acute febrile illness? no    Have you ever had a severe reaction to latex? no    Vaccine information given and explained to patient? yes ndc  [id number]  Appended Document: Complete Physical  Exam        Current Allergies: No known allergies         Complete Medication List: 1)  Metformin Hcl 500 Mg Tabs (Metformin hcl) .... 2 tablets by mouth two times a day 2)  Zestoretic 10-12.5 Mg Tabs (Lisinopril-hydrochlorothiazide) .[name].. 1 tablet by mouth daily for blood pressure 3)  Glucometer Elite Classic Kit (Blood glucose monitoring suppl) .... Dispense glucometer, lancets, test strips to check blood sugar daily dx 250.00 4)  Multivitamins Tabs (Multiple vitamin) .[name].. 1 tablet by mouth daily 5)  Nexium 40 Mg Pack (Esomeprazole magnesium) .[name].. 1 tablet by mouth daily for stomach 6)  Lamisil 250 Mg Tabs (Terbinafine hcl) .[name].. 1 tablet by mouth daily for toenails 7)  Viagra 100 Mg Tabs (Sildenafil citrate) .[name].. 1 tablet by mouth 30 minutes before sexual activity 8)  Ferrous Sulfate 325 (65 Fe) Mg Tbec (Ferrous sulfate) .[name].. 1 tablet by mouth daily 9)  Pravachol 20 Mg Tabs (Pravastatin sodium) .[name].. 1 tablet by mouth nightly for cholesterol 10)  Androgel 50 Mg/5gm Gel (Testosterone) .... Apply daily to clean, dry, intact skin of  shoudlers, upper arms or abdomen      Laboratory Results    Stool - Occult Blood Hemmoccult #1: negative Date: 01/12/2009 Hemoccult #2: negative Date: 01/12/2009 Hemoccult #3: negative Date: 01/12/2009

## 2011-01-23 NOTE — Assessment & Plan Note (Signed)
Summary: HTN/Diabetes   Vital Signs:  Patient profile:   50 year old male Height:      71.50 inches Weight:      270 pounds Temp:     97.3 degrees F oral Pulse rate:   72 / minute Pulse rhythm:   regular Resp:     18 per minute BP sitting:   122 / 84  (left arm) Cuff size:   large  Vitals Entered By: Armenia Shannon (March 24, 2009 2:03 PM)  History of Present Illness:  Pt into the office for follow up.  Diabetes - Pt reports that he checks his blood sugar at home. No values are here with pt.  He reports that he is taking the medications as ordered.  Testosterone - Pt did get his supplement about 2 weeks.  Pt has a problem with erectile dysfuntion.  he has been [place] Viagra.   Hypertension History:      He denies headache, chest pain, and palpitations.  He notes no problems with any antihypertensive medication side effects.  Pt into the office for follow up. He is taking his blood pressure medications daily.        Positive major cardiovascular risk factors include male age 34 years old or older, diabetes, hyperlipidemia, and hypertension.  Negative major cardiovascular risk factors include negative family history for ischemic heart disease and non-tobacco-user status.        Further assessment for target organ damage reveals no history of ASHD, stroke/TIA, or peripheral vascular disease.    Lipid Management History:      Positive NCEP/ATP III risk factors include male age 37 years old or older, diabetes, and hypertension.  Negative NCEP/ATP III risk factors include no family history for ischemic heart disease, non-tobacco-user status, no ASHD (atherosclerotic heart disease), no prior stroke/TIA, no peripheral vascular disease, and no history of aortic aneurysm.       Allergies: No Known Drug Allergies  Review of Systems General:  Denies fever. Resp:  Denies cough. GI:  Denies abdominal pain, nausea, and vomiting. GU:  Complains of erectile dysfunction.  Physical Exam  General:  alert.   Head:  normocephalic.   Lungs:  normal breath sounds.   Heart:  normal rate and regular rhythm.   Abdomen:  normal bowel sounds.   Msk:  normal ROM.   Neurologic:  gait normal.   Psych:  Oriented X3.    Diabetes Management Exam:    Foot Exam (with socks and/or shoes not present):       Sensory-Monofilament:          Left foot: normal          Right foot: normal   Impression & Recommendations:  Problem # 1:  DIABETES MELLITUS (ICD-250.00) needs better control will add glucotrol 5mg  by mouth daily pneumovax given today His updated medication list for this problem includes:    Metformin Hcl 500 Mg Tabs (Metformin hcl) .[name]... 2 tablets by mouth two times a day    Zestoretic 10-12.5 Mg Tabs (Lisinopril-hydrochlorothiazide) .[name]... 1 tablet by mouth daily for blood pressure    Glucotrol Xl 5 Mg Xr24h-tab (Glipizide) .[name]... 1 tablet by mouth daily for blood sugar  Orders: Podiatry Referral (Podiatry)  Problem # 2:  HYPERTENSION (ICD-401.9)  His updated medication list for this problem includes:    Zestoretic 10-12.5 Mg Tabs (Lisinopril-hydrochlorothiazide) .[name]... 1 tablet by mouth daily for blood pressure  Problem # 3:  PURE HYPERCHOLESTEROLEMIA (ICD-272.0)  His updated medication list  for this problem includes:    Pravachol 20 Mg Tabs (Pravastatin sodium) .[name]... 1 tablet by mouth nightly for cholesterol  Problem # 4:  GERD (ICD-530.81)  His updated medication list for this problem includes:    Nexium 40 Mg Pack (Esomeprazole magnesium) .[name]... 1 tablet by mouth daily for stomach  Complete Medication List: 1)  Metformin Hcl 500 Mg Tabs (Metformin hcl) .... 2 tablets by mouth two times a day 2)  Zestoretic 10-12.5 Mg Tabs (Lisinopril-hydrochlorothiazide) .[name].. 1 tablet by mouth daily for blood pressure 3)  Glucometer Elite Classic Kit (Blood glucose monitoring suppl) .... Dispense glucometer, lancets, test strips to check blood sugar daily dx 250.00 4)   Multivitamins Tabs (Multiple vitamin) .[name].. 1 tablet by mouth daily 5)  Nexium 40 Mg Pack (Esomeprazole magnesium) .[name].. 1 tablet by mouth daily for stomach 6)  Lamisil 250 Mg Tabs (Terbinafine hcl) .[name].. 1 tablet by mouth daily for toenails 7)  Viagra 100 Mg Tabs (Sildenafil citrate) .[name].. 1 tablet by mouth 30 minutes before sexual activity 8)  Ferrous Sulfate 325 (65 Fe) Mg Tbec (Ferrous sulfate) .[name].. 1 tablet by mouth daily 9)  Pravachol 20 Mg Tabs (Pravastatin sodium) .[name].. 1 tablet by mouth nightly for cholesterol 10)  Androgel 50 Mg/5gm Gel (Testosterone) .... Apply daily to clean, dry, intact skin of  shoudlers, upper arms or abdomen 11)  Glucotrol Xl 5 Mg Xr24h-tab (Glipizide) .[name].. 1 tablet by mouth daily for blood sugar  Other Orders: Pneumococcal Vaccine (16109) Admin 1st Vaccine (60454)  Hypertension Assessment/Plan:      The patient's hypertensive risk group is category C: Target organ damage and/or diabetes.  His calculated 10 year risk of coronary heart disease is 11 %.  Today's blood pressure is 122/84.  His blood pressure goal is < 130/80.  Lipid Assessment/Plan:      Based on NCEP/ATP III, the patient's risk factor category is "history of diabetes".  From this information, the patient's calculated lipid goals are as follows: Total cholesterol goal is 200; LDL cholesterol goal is 100; HDL cholesterol goal is 40; Triglyceride goal is 150.    Patient Instructions: 1)  Follow up into the office in 3 months (July 2010). 2)  *You will need to start new blood sugar medications** 3)  Lab visit in 6 weeks for lipids, testosterone, cmp. 4)  Schedule appointment at [facility] for podiatry Prescriptions: GLUCOTROL XL 5 MG XR24H-TAB (GLIPIZIDE) 1 tablet by mouth daily for blood pressure  #30 x 3   Entered and Authorized by:   Lehman Prom FNP   Signed by:   Lehman Prom FNP on 03/24/2009   Method used:   Print then Give to Patient   RxID:   [id number] VIAGRA 100 MG TABS  (SILDENAFIL CITRATE) 1 tablet by mouth 30 minutes before sexual activity  #10 x 1   Entered and Authorized by:   Lehman Prom FNP   Signed by:   Lehman Prom FNP on 03/24/2009   Method used:   Print then Give to Patient   RxID:   [id number] LAMISIL 250 MG TABS (TERBINAFINE HCL) 1 tablet by mouth daily for toenails  #30 x 2   Entered and Authorized by:   Lehman Prom FNP   Signed by:   Lehman Prom FNP on 03/24/2009   Method used:   Print then Give to Patient   RxID:   [id number]        Last LDL:  120 (12/08/2008 10:59:00 PM)          Diabetic Foot Exam    10-g (5.07) Semmes-Weinstein Monofilament Test Performed by: Armenia Shannon          Right Foot          Left Foot Visual Inspection               Test Control      normal         normal Site 1         normal         normal Site 2         normal         normal Site 3         normal         normal Site 4         normal         normal Site 5         normal         normal Site 6         normal         normal Site 7         normal         normal Site 8         normal         normal Site 9         normal         normal Site 10         normal         normal  Impression      normal         normal   Laboratory Results   Blood Tests   Date/Time Received: March 24, 2009 2:09 PM   HGBA1C: 7.7%   (Normal Range: Non-Diabetic - 3-6%   Control Diabetic - 6-8%) CBG Random:: 156mg /dL       Laboratory Results   Blood Tests     HGBA1C: 7.7%   (Normal Range: Non-Diabetic - 3-6%   Control Diabetic - 6-8%) CBG Random: 156      Pneumovax Vaccine    Vaccine Type: Pneumovax    Site: right deltoid    Mfr: Merck    Dose: 0.5 ml    Route: IM    Given by: Levon Hedger    Exp. Date: 01/28/2010    Lot #: [id number]    VIS given: 07/21/96 version given March 24, 2009.    ndc  1610-9604-54

## 2011-01-23 NOTE — Assessment & Plan Note (Signed)
Summary: Diabetes/HTN   Vital Signs:  Patient profile:   50 year old male Weight:      277 pounds BMI:     38.23 BSA:     2.44 Temp:     97.6 degrees F oral Pulse rate:   85 / minute Pulse rhythm:   regular Resp:     20 per minute BP sitting:   147 / 97  (left arm) Cuff size:   large  Vitals Entered By: Levon Hedger (August 12, 2009 8:33 AM) CC: follow-up visit 3 month DM, Hypertension Management, Lipid Management, Abdominal Pain Is Patient Diabetic? Yes  Pain Assessment Patient in pain? no      CBG Result 132 CBG Device ID B  Does patient need assistance? Functional Status Self care Ambulation Normal   CC:  follow-up visit 3 month DM, Hypertension Management, Lipid Management, and Abdominal Pain.  History of Present Illness:  Pt into the office for follow up on diabetes and htn.  Diabetes - Pt reports that he checks his blood sugar at home but only once per week. Missed last podiatry appt due to work schedule   Social - Truck driver  Pt does present today with his medications  Diabetes Management History:      The patient is a 50 years old male who comes in for evaluation of DM Type 2.  He says that he is not exercising regularly.    Dyspepsia History:      He has no alarm features of dyspepsia including no history of melena, hematochezia, dysphagia, persistent vomiting, or involuntary weight loss > 5%.  There is a prior history of GERD.  The patient does not have a prior history of documented ulcer disease.  The dominant symptom is heartburn or acid reflux.  An H-2 blocker medication is not currently being taken.  He has no history of a positive H. Pylori serology.  No previous upper endoscopy has been done.    Hypertension History:      He denies headache, chest pain, and palpitations.  He notes no problems with any antihypertensive medication side effects.        Positive major cardiovascular risk factors include male age 27 years old or older, diabetes,  hyperlipidemia, hypertension, and current tobacco user.  Negative major cardiovascular risk factors include negative family history for ischemic heart disease.        Further assessment for target organ damage reveals no history of ASHD, cardiac end-organ damage (CHF/LVH), stroke/TIA, peripheral vascular disease, renal insufficiency, or hypertensive retinopathy.    Lipid Management History:      Positive NCEP/ATP III risk factors include male age 25 years old or older, diabetes, current tobacco user, and hypertension.  Negative NCEP/ATP III risk factors include no family history for ischemic heart disease, no ASHD (atherosclerotic heart disease), no prior stroke/TIA, no peripheral vascular disease, and no history of aortic aneurysm.        The patient states that he knows about the "Therapeutic Lifestyle Change" diet.  The patient does not know about adjunctive measures for cholesterol lowering.  He expresses no side effects from his lipid-lowering medication.  The patient denies any symptoms to suggest myopathy or liver disease.         Habits & Providers  Alcohol-Tobacco-Diet     Alcohol drinks/day: 0     Tobacco Status: current     Tobacco Counseling: to quit use of tobacco products     Cigarette Packs/Day: 0.5  Year Started: 2010  Exercise-Depression-Behavior     Does Patient Exercise: no     Have you felt down or hopeless? no     Have you felt little pleasure in things? no     Depression Counseling: not indicated; screening negative for depression     Drug Use: yes     Seat Belt Use: 100     Sun Exposure: occasionally  Comments: restarted smoking 2 years ago  Medications Prior to Update: 1)  Metformin Hcl 500 Mg Tabs (Metformin Hcl) .... 2 Tablets By Mouth Two Times A Day 2)  Zestoretic 10-12.5 Mg Tabs (Lisinopril-Hydrochlorothiazide) .[name].. 1 Tablet By Mouth Daily For Blood Pressure 3)  Glucometer Elite Classic  Kit (Blood Glucose Monitoring Suppl) .... Dispense Glucometer,  Lancets, Test Strips To Check Blood Sugar Daily Dx 250.00 4)  Multivitamins  Tabs (Multiple Vitamin) .[name].. 1 Tablet By Mouth Daily 5)  Nexium 40 Mg Pack (Esomeprazole Magnesium) .[name].. 1 Tablet By Mouth Daily For Stomach 6)  Lamisil 250 Mg Tabs (Terbinafine Hcl) .[name].. 1 Tablet By Mouth Daily For Toenails 7)  Viagra 100 Mg Tabs (Sildenafil Citrate) .[name].. 1 Tablet By Mouth 30 Minutes Before Sexual Activity 8)  Ferrous Sulfate 325 (65 Fe) Mg Tbec (Ferrous Sulfate) .[name].. 1 Tablet By Mouth Daily 9)  Pravachol 20 Mg Tabs (Pravastatin Sodium) .[name].. 1 Tablet By Mouth Nightly For Cholesterol 10)  Androgel 50 Mg/5gm Gel (Testosterone) .... Apply 5gm Daily To Clean, Dry, Intact Skin of  Shoudlers, Upper Arms or Abdomen 11)  Glucotrol Xl 5 Mg Xr24h-Tab (Glipizide) .[name].. 1 Tablet By Mouth Daily For Blood Sugar  Allergies (verified): No Known Drug Allergies  Social History: Smoking Status:  current Packs/Day:  0.5  Review of Systems General:  Denies fever. CV:  Denies chest pain or discomfort. Resp:  Denies cough. GI:  Denies abdominal pain, nausea, and vomiting.  Physical Exam  General:  alert.   Head:  normocephalic.   Lungs:  normal breath sounds.   Heart:  normal rate and regular rhythm.   Abdomen:  normal bowel sounds.   Msk:  up to exam table Neurologic:  alert & oriented X3.    Diabetes Management Exam:    Foot Exam (with socks and/or shoes not present):       Sensory-Monofilament:          Left foot: normal          Right foot: normal       Inspection:          Left foot: abnormal             Comments: dry          Right foot: abnormal             Comments: dry       Nails:          Left foot: too long          Right foot: too long   Impression & Recommendations:  Problem # 1:  DIABETES MELLITUS (ICD-250.00) will check hgba1c advised pt to get eye exam His updated medication list for this problem includes:    Metformin Hcl 1000 Mg Tabs (Metformin hcl) ..... One tablet by mouth  two times a day for blood sugar    Zestoretic 20-12.5 Mg Tabs (Lisinopril-hydrochlorothiazide) ..... One tablet by mouth daily for blood pressure **note change in dose**    Glucotrol Xl 5 Mg Xr24h-tab (Glipizide) .[name]... 1 tablet by mouth daily for  blood sugar  Orders: Capillary Blood Glucose/CBG (82948) T- Hemoglobin A1C (29562-13086)  Problem # 2:  HYPERTENSION (ICD-401.9) BP elevated today. dose increased DASH diet His updated medication list for this problem includes:    Zestoretic 20-12.5 Mg Tabs (Lisinopril-hydrochlorothiazide) ..... One tablet by mouth daily for blood pressure **note change in dose**  Orders: T-Comprehensive Metabolic Panel (57846-96295)  Problem # 3:  PURE HYPERCHOLESTEROLEMIA (ICD-272.0) will check lipids today advised low cholesterol diet His updated medication list for this problem includes:    Pravachol 20 Mg Tabs (Pravastatin sodium) .[name]... 1 tablet by mouth nightly for cholesterol  Orders: T-Lipid Profile (28413-24401) T-Comprehensive Metabolic Panel (02725-36644)  Problem # 4:  GERD (ICD-530.81)  His updated medication list for this problem includes:    Nexium 40 Mg Pack (Esomeprazole magnesium) .[name]... 1 tablet by mouth daily for stomach PHQ-9 score is 5  Problem # 6:  TESTOSTERONE DEFICIENCY (ICD-257.2) Pt admits that he is not taking supplement as ordered advised pt that his erectile dysfuntion may be due in part to deficiency  Problem # 7:  COLON CANCER (ICD-153.9) recent colonscopy done earlier this week. pt reports normal but will await results  Complete Medication List: 1)  Metformin Hcl 1000 Mg Tabs (Metformin hcl) .... One tablet by mouth two times a day for blood sugar 2)  Zestoretic 20-12.5 Mg Tabs (Lisinopril-hydrochlorothiazide) .... One tablet by mouth daily for blood pressure **note change in dose** 3)  Glucometer Elite Classic Kit (Blood glucose monitoring suppl) .... Dispense glucometer, lancets, test strips to check blood sugar  daily dx 250.00 4)  Multivitamins Tabs (Multiple vitamin) .[name].. 1 tablet by mouth daily 5)  Nexium 40 Mg Pack (Esomeprazole magnesium) .[name].. 1 tablet by mouth daily for stomach 6)  Lamisil 250 Mg Tabs (Terbinafine hcl) .[name].. 1 tablet by mouth daily for toenails 7)  Viagra 100 Mg Tabs (Sildenafil citrate) .[name].. 1 tablet by mouth 30 minutes before sexual activity 8)  Ferrous Sulfate 325 (65 Fe) Mg Tbec (Ferrous sulfate) .[name].. 1 tablet by mouth daily 9)  Pravachol 20 Mg Tabs (Pravastatin sodium) .[name].. 1 tablet by mouth nightly for cholesterol 10)  Androgel 50 Mg/5gm Gel (Testosterone) .... Apply 5gm daily to clean, dry, intact skin of  shoudlers, upper arms or abdomen 11)  Glucotrol Xl 5 Mg Xr24h-tab (Glipizide) .[name].. 1 tablet by mouth daily for blood sugar  Diabetes Management Assessment/Plan:      The following lipid goals have been established for the patient: Total cholesterol goal of 200; LDL cholesterol goal of 100; HDL cholesterol goal of 40; Triglyceride goal of 150.  His blood pressure goal is < 130/80.    Hypertension Assessment/Plan:      The patient's hypertensive risk group is category C: Target organ damage and/or diabetes.  His calculated 10 year risk of coronary heart disease is 27 %.  Today's blood pressure is 147/97.  His blood pressure goal is < 130/80.  Lipid Assessment/Plan:      Based on NCEP/ATP III, the patient's risk factor category is "history of diabetes".  The patient's lipid goals are as follows: Total cholesterol goal is 200; LDL cholesterol goal is 100; HDL cholesterol goal is 40; Triglyceride goal is 150.    Patient Instructions: 1)  Follow up in December or sooner if necessary 2)  You will get a flu vaccine  3)  Your blood pressure was slightly elevated today. Your medications will be increased.  Everything else will stay the same. You will be informed of any abnormal.  Prescriptions: ZESTORETIC 20-12.5 MG TABS (LISINOPRIL-HYDROCHLOROTHIAZIDE) One tablet by mouth daily  for blood pressure **Note change in dose**  #30 x 5   Entered and Authorized by:   Lehman Prom FNP   Signed by:   Lehman Prom FNP on 08/12/2009   Method used:   Printed then faxed to ...       [facility] (retail)       [address] [place], [place]  [address]       Ph: [phone]       Fax: [phone]   RxID:   [id number]   Last LDL:                                                 120 (12/08/2008 10:59:00 PM)        Diabetic Foot Exam Foot Inspection Are the toenails long?                Yes Are the toenails thick?                Yes         10-g (5.07) Semmes-Weinstein Monofilament Test Performed by: Levon Hedger          Right Foot          Left Foot Visual Inspection               Test Control      normal         normal Site 1         normal         normal Site 2         normal         normal Site 3         normal         normal Site 4         normal         normal Site 5         normal         normal Site 6         normal         normal Site 7         normal         normal Site 8         normal         normal Site 9         normal         normal Site 10         normal         normal  Impression      normal         normal    Colonoscopy  Procedure date:  08/10/2009  Findings:       Results: Normal. Hx of colon cancer  Comments:      Repeat colonoscopy in 5 years.    Colonoscopy  Procedure date:  08/10/2009  Findings:       Results: Normal. Hx of colon cancer  Comments:      Repeat colonoscopy in 5 years.

## 2013-07-27 DIAGNOSIS — I10 Essential (primary) hypertension: Secondary | ICD-10-CM | POA: Insufficient documentation

## 2013-07-27 DIAGNOSIS — Z720 Tobacco use: Secondary | ICD-10-CM | POA: Insufficient documentation

## 2013-12-14 ENCOUNTER — Emergency Department (INDEPENDENT_AMBULATORY_CARE_PROVIDER_SITE_OTHER)
Admission: EM | Admit: 2013-12-14 | Discharge: 2013-12-14 | Disposition: A | Discharge status: Home / Self Care | Payer: BC Managed Care – PPO | Source: Home / Self Care | Attending: Family Medicine | Admitting: Family Medicine

## 2013-12-14 ENCOUNTER — Encounter (HOSPITAL_COMMUNITY): Payer: Self-pay | Admitting: Emergency Medicine

## 2013-12-14 DIAGNOSIS — R7309 Other abnormal glucose: Secondary | ICD-10-CM

## 2013-12-14 DIAGNOSIS — E1165 Type 2 diabetes mellitus with hyperglycemia: Secondary | ICD-10-CM

## 2013-12-14 DIAGNOSIS — R739 Hyperglycemia, unspecified: Secondary | ICD-10-CM

## 2013-12-14 HISTORY — DX: Pure hypercholesterolemia, unspecified: E78.00

## 2013-12-14 HISTORY — DX: Malignant neoplasm of colon, unspecified: C18.9

## 2013-12-14 HISTORY — DX: Type 2 diabetes mellitus without complications: E11.9

## 2013-12-14 HISTORY — DX: Essential (primary) hypertension: I10

## 2013-12-14 LAB — HEMOGLOBIN A1C
Hgb A1c MFr Bld: 10.1 % — ABNORMAL HIGH (ref <5.7)
Mean Plasma Glucose: 243 mg/dL — ABNORMAL HIGH (ref <117)

## 2013-12-14 LAB — POCT I-STAT, CHEM 8
BUN: 25 mg/dL — ABNORMAL HIGH (ref 6–23)
Calcium, Ion: 1.26 mmol/L — ABNORMAL HIGH (ref 1.12–1.23)
Chloride: 99 mEq/L (ref 96–112)
Creatinine, Ser: 1.4 mg/dL — ABNORMAL HIGH (ref 0.50–1.35)
Glucose, Bld: 360 mg/dL — ABNORMAL HIGH (ref 70–99)
HCT: 44 % (ref 39.0–52.0)
Hemoglobin: 15 g/dL (ref 13.0–17.0)
Potassium: 4.2 mEq/L (ref 3.5–5.1)
Sodium: 138 mEq/L (ref 135–145)
TCO2: 27 mmol/L (ref 0–100)

## 2013-12-14 NOTE — ED Notes (Signed)
Patient is a resident at [facility].  Patient was unable to follow up with pcp(not local) since starting medication.  Patient reports another resident at [facility] checked his "sugar" reported a reading as "high".  Patient denies feeling bad.

## 2013-12-14 NOTE — ED Provider Notes (Signed)
CSN: [id number]     Arrival date & time 12/14/13  7829 History   First MD Initiated Contact with Patient 12/14/13 [phone]     Chief Complaint  Patient presents with  . Hyperglycemia   (Consider location/radiation/quality/duration/timing/severity/associated sxs/prior Treatment) HPI Comments: 52 year old male presents requesting management of his diabetes. He is currently a resident of the [facility] house and does not have access to his primary care physician, his PCP is in another town and he will not be moving back there. He was put on Levemir, 25 units twice a day. On metformin and a sulfonylurea but these were stopped for elevated creatinine. He was told that he will probably have to increase his dose. He still has 1 box of Levemir pens at home but he checked his blood sugar with someone else's glucometer and the reading just said "high." He has checked his blood sugar multiple times and it says "high" every time. He is in the process of getting his orange card, he does not have this yet. He denies any nausea, vomiting, or decreased sensation in his legs or feet. He has never had any episodes of low blood sugar. Additionally, he is requesting samples of Viagra. He has erectile dysfunction, previously treated successfully with Viagra and Levitra. He would not be to afford a prescription of Viagra at this time. He also has a history of hypogonadism.  Patient is a 52 y.o. male presenting with hyperglycemia.  Hyperglycemia Associated symptoms: no abdominal pain, no chest pain, no dizziness, no dysuria, no fatigue, no fever, no nausea, no shortness of breath and no vomiting     Past Medical History  Diagnosis Date  . Hypertension   . Diabetes mellitus without complication   . High cholesterol   . Colon cancer    Past Surgical History  Procedure Laterality Date  . Colon surgery     No family history on file. History  Substance Use Topics  . Smoking status: Never Smoker   . Smokeless tobacco:  Not on file  . Alcohol Use: No    Review of Systems  Constitutional: Negative for fever, chills and fatigue.  HENT: Negative for sore throat.   Eyes: Negative for visual disturbance.  Respiratory: Negative for cough and shortness of breath.   Cardiovascular: Negative for chest pain, palpitations and leg swelling.  Gastrointestinal: Negative for nausea, vomiting, abdominal pain, diarrhea and constipation.  Genitourinary: Negative for dysuria, urgency, frequency and hematuria.       Erectile dysfunction  Musculoskeletal: Negative for arthralgias, myalgias, neck pain and neck stiffness.  Skin: Negative for rash.  Neurological: Negative for dizziness, weakness, light-headedness and numbness.    Allergies  Review of patient's allergies indicates no known allergies.  Home Medications   Current Outpatient Rx  Name  Route  Sig  Dispense  Refill  . insulin detemir (LEVEMIR) 100 UNIT/ML injection   Subcutaneous   Inject 40 Units into the skin at bedtime.         [name] lisinopril-hydrochlorothiazide (PRINZIDE,ZESTORETIC) 20-12.5 MG per tablet   Oral   Take 1 tablet by mouth daily.         . pravastatin (PRAVACHOL) 40 MG tablet   Oral   Take 40 mg by mouth daily.          BP 148/92  Pulse 100  Temp(Src) 98.5 F (36.9 C) (Oral)  Resp 20  SpO2 100% Physical Exam  Nursing note and vitals reviewed. Constitutional: He is oriented to person, place, and time. He  appears well-developed and well-nourished. No distress.  HENT:  Head: Normocephalic.  Cardiovascular: Normal rate, regular rhythm and normal heart sounds.  Exam reveals no gallop and no friction rub.   No murmur heard. Pulmonary/Chest: Effort normal and breath sounds normal. No respiratory distress. He has no wheezes. He has no rales.  Abdominal: Soft. He exhibits no mass. There is no tenderness. There is no rebound and no guarding.  Musculoskeletal:  No foot ulcers  Neurological: He is alert and oriented to person,  place, and time. Coordination normal.  Skin: Skin is warm and dry. No rash noted. He is not diaphoretic.  Psychiatric: He has a normal mood and affect. Judgment normal.    ED Course  Procedures (including critical care time) Labs Review Labs Reviewed  POCT I-STAT, CHEM 8 - Abnormal; Notable for the following:    BUN 25 (*)    Creatinine, Ser 1.40 (*)    Glucose, Bld 360 (*)    Calcium, Ion 1.26 (*)    All other components within normal limits  HEMOGLOBIN A1C   Imaging Review No results found.    MDM   1. DM (diabetes mellitus), type 2, uncontrolled   2. Hyperglycemia    Blood sugar is high today, patient has no symptoms of DKA, in no distress. A1c is pending. We'll increase the Levemir to 27 units twice daily instead of the 25 units twice daily he is currently on. He will followup with the [facility] as soon as possible. He will followup immediately here in the emergency department for any symptoms of low blood sugar, discussed with the patient    Graylon Good, PA-C 12/14/13 1049

## 2013-12-15 NOTE — ED Provider Notes (Signed)
Medical screening examination/treatment/procedure(s) were performed by a resident physician or non-physician practitioner and as the supervising physician I was immediately available for consultation/collaboration.  Clementeen Graham, MD    Rodolph Bong, MD 12/15/13 [phone]

## 2013-12-21 NOTE — ED Notes (Addendum)
Hgb. A1C 10.1 H, mean glucose 243 H.  Message sent to [employer] PA. Vassie Moselle 12/21/2013 No further action given. 12/25/2013

## 2014-01-25 ENCOUNTER — Emergency Department (INDEPENDENT_AMBULATORY_CARE_PROVIDER_SITE_OTHER)
Admission: EM | Admit: 2014-01-25 | Discharge: 2014-01-25 | Disposition: A | Discharge status: Home / Self Care | Payer: BC Managed Care – PPO | Source: Home / Self Care | Attending: Family Medicine | Admitting: Family Medicine

## 2014-01-25 ENCOUNTER — Encounter (HOSPITAL_COMMUNITY): Payer: Self-pay | Admitting: Emergency Medicine

## 2014-01-25 DIAGNOSIS — L290 Pruritus ani: Secondary | ICD-10-CM

## 2014-01-25 DIAGNOSIS — Z85038 Personal history of other malignant neoplasm of large intestine: Secondary | ICD-10-CM

## 2014-01-25 DIAGNOSIS — R109 Unspecified abdominal pain: Secondary | ICD-10-CM

## 2014-01-25 LAB — COMPREHENSIVE METABOLIC PANEL
ALT: 11 U/L (ref 0–53)
AST: 15 U/L (ref 0–37)
Albumin: 4.1 g/dL (ref 3.5–5.2)
Alkaline Phosphatase: 118 U/L — ABNORMAL HIGH (ref 39–117)
BUN: 19 mg/dL (ref 6–23)
CO2: 26 mEq/L (ref 19–32)
Calcium: 9.5 mg/dL (ref 8.4–10.5)
Chloride: 99 mEq/L (ref 96–112)
Creatinine, Ser: 1.29 mg/dL (ref 0.50–1.35)
GFR calc Af Amer: 71 mL/min — ABNORMAL LOW (ref >90)
GFR calc non Af Amer: 61 mL/min — ABNORMAL LOW (ref >90)
Glucose, Bld: 232 mg/dL — ABNORMAL HIGH (ref 70–99)
Potassium: 4.3 mEq/L (ref 3.7–5.3)
Sodium: 140 mEq/L (ref 137–147)
Total Bilirubin: 0.4 mg/dL (ref 0.3–1.2)
Total Protein: 8.1 g/dL (ref 6.0–8.3)

## 2014-01-25 LAB — CBC
HCT: 37.3 % — ABNORMAL LOW (ref 39.0–52.0)
Hemoglobin: 12.8 g/dL — ABNORMAL LOW (ref 13.0–17.0)
MCH: 27.5 pg (ref 26.0–34.0)
MCHC: 34.3 g/dL (ref 30.0–36.0)
MCV: 80 fL (ref 78.0–100.0)
Platelets: 288 10*3/uL (ref 150–400)
RBC: 4.66 MIL/uL (ref 4.22–5.81)
RDW: 13.7 % (ref 11.5–15.5)
WBC: 4.6 10*3/uL (ref 4.0–10.5)

## 2014-01-25 LAB — OCCULT BLOOD, POC DEVICE: Fecal Occult Bld: NEGATIVE

## 2014-01-25 MED ORDER — HYDROCORTISONE 2.5 % RE CREA: TOPICAL_CREAM | RECTAL | Status: DC

## 2014-01-25 NOTE — ED Notes (Signed)
C/o abdominal cramping, diarrhea, and hemorrhoids.  States that after eating or drinking has diarrhea.  Hx of colon cancer.  Denies bloody stool.   Denies fever n/v.  Pt has not tried any otc meds for treatment.

## 2014-01-25 NOTE — ED Provider Notes (Signed)
Kyle Merritt is a 53 y.o. male who presents to Urgent Care today for abdominal pain and anal pruritus. Patient has had one or 2 weeks of crampy intermittent mild abdominal pain. This is also associated with occasional diarrhea. He notes the symptoms are typically worse after eating. He notes the pain is in the lower left quadrant. He also notes some mild anal itching. He has not used any medications yet. He does note a pertinent past medical history for colon cancer in 2004. He is unsure of this stage. He had surgery and some chemotherapy. The services were performed in [facility]. He does have private health insurance and a primary care provider in the area however he is looking to use the [place] and orange card if possible. He has not contacted her primary care provider for this issue yet.   Past Medical History  Diagnosis Date  . Hypertension   . Diabetes mellitus without complication   . High cholesterol   . Colon cancer    History  Substance Use Topics  . Smoking status: Never Smoker   . Smokeless tobacco: Not on file  . Alcohol Use: No   ROS as above Medications: No current facility-administered medications for this encounter.   Current Outpatient Prescriptions  Medication Sig Dispense Refill  . insulin detemir (LEVEMIR) 100 UNIT/ML injection Inject 40 Units into the skin at bedtime.      [name] lisinopril-hydrochlorothiazide (PRINZIDE,ZESTORETIC) 20-12.5 MG per tablet Take 1 tablet by mouth daily.      . pravastatin (PRAVACHOL) 40 MG tablet Take 40 mg by mouth daily.      . hydrocortisone (ANUSOL-HC) 2.5 % rectal cream Apply rectally 2 times daily  30 g  1    Exam:  BP 119/67  Pulse 94  Temp(Src) 98.3 F (36.8 C) (Oral)  Resp 18  SpO2 98% Gen: Well NAD nontoxic appearing HEENT: EOMI,  MMM Lungs: Normal work of breathing. CTABL Heart: RRR no MRG Abd: NABS, Soft. NT, ND mature appearing scar. No masses palpated Exts: Brisk capillary refill,  warm and well perfused.  Rectal exam: Normal. Anus without hemorrhoids. Digital rectal exam with no masses palpated.  Fecal occult blood test was negative  Assessment and Plan: 53 y.o. male with abdominal pain diarrhea and anal pruritus in the setting of remote colon cancer. This is somewhat concerning for the possibility of a recurrence. Recommended patient followup with his primary care provider for referral to gastroenterology for colonoscopy if his symptoms persist. Additionally I recommend using Anusol cream for anal pruritus.  Emphasize the importance of following up with PCP.  Discussed warning signs or symptoms. Please see discharge instructions. Patient expresses understanding.    Gregor Hams, MD 01/25/14 [phone]

## 2014-01-25 NOTE — ED Notes (Signed)
Pt waiting blood draw.

## 2014-01-25 NOTE — Discharge Instructions (Signed)
Thank you for coming in today. I will call you with results today.  If you have insurance please follow up with your primary care doctor.  If you do not have insurance please see the community wellness center.  You will NEED a repeat colonoscopy soon.  If you get worse go to the ER because they can do special tests there.  Try using the cream I prescribed today for itching.  If your belly pain worsens, or you have high fever, bad vomiting, blood in your stool or black tarry stool go to the Emergency Room.   Anal Pruritus Anal pruritus is when the area around the opening of the butt (anus) itches. This itching often happens when the area becomes moist. Moisture may be caused by sweating or a small amount of poop (stool) left on the area. Scratching can cause more skin damage. HOME CARE  Keep clean by washing your body well.  Clean the affected area with wet toilet paper, baby wipes, or a wet washcloth. Do this after you poop and at bedtime. Avoid using soaps on this area. Dry the area well. Pat the area dry.  Do not scrub the area with anything, even toilet paper.  Do not scratch the itchy area. Scratching makes the itching worse.  Take a warm water bath (sitz bath). Do this for 15 to 20 minutes, 2 to 3 times a day. Pat the area dry.  Use zinc oxide cream or a cream that keeps moisture away on the area.  Only take medicines as told by your doctor.  Talk to your doctor about fiber pills (supplements).  Wear cotton underwear and loose clothing.  Do not use bubble bath, scented toilet paper, or genital deodorants. GET HELP RIGHT AWAY IF:  Your itching does not get better or it gets worse.  You have a fever.  You have more pain, puffiness (swelling), or redness. MAKE SURE YOU:   Understand these instructions.  Will watch your condition.  Will get help right away if you are not doing well or get worse. Document Released: 08/22/2011 Document Revised: 03/03/2012 Document  Reviewed: 08/22/2011 [facility] Patient Information 2014 [place], [place].

## 2014-03-22 ENCOUNTER — Ambulatory Visit: Payer: BC Managed Care – PPO | Attending: Internal Medicine

## 2014-04-05 ENCOUNTER — Emergency Department (HOSPITAL_COMMUNITY)
Admission: EM | Admit: 2014-04-05 | Discharge: 2014-04-05 | Disposition: A | Discharge status: Home / Self Care | Payer: Self-pay | Attending: Emergency Medicine | Admitting: Emergency Medicine

## 2014-04-05 ENCOUNTER — Encounter (HOSPITAL_COMMUNITY): Payer: Self-pay | Admitting: Emergency Medicine

## 2014-04-05 ENCOUNTER — Emergency Department (HOSPITAL_COMMUNITY): Payer: BC Managed Care – PPO

## 2014-04-05 DIAGNOSIS — E7800 Pure hypercholesterolemia, unspecified: Secondary | ICD-10-CM | POA: Insufficient documentation

## 2014-04-05 DIAGNOSIS — Z79899 Other long term (current) drug therapy: Secondary | ICD-10-CM | POA: Insufficient documentation

## 2014-04-05 DIAGNOSIS — M545 Low back pain, unspecified: Secondary | ICD-10-CM | POA: Insufficient documentation

## 2014-04-05 DIAGNOSIS — E119 Type 2 diabetes mellitus without complications: Secondary | ICD-10-CM | POA: Insufficient documentation

## 2014-04-05 DIAGNOSIS — R071 Chest pain on breathing: Secondary | ICD-10-CM | POA: Insufficient documentation

## 2014-04-05 DIAGNOSIS — I10 Essential (primary) hypertension: Secondary | ICD-10-CM | POA: Insufficient documentation

## 2014-04-05 DIAGNOSIS — R0789 Other chest pain: Secondary | ICD-10-CM

## 2014-04-05 DIAGNOSIS — Z85038 Personal history of other malignant neoplasm of large intestine: Secondary | ICD-10-CM | POA: Insufficient documentation

## 2014-04-05 DIAGNOSIS — R739 Hyperglycemia, unspecified: Secondary | ICD-10-CM

## 2014-04-05 LAB — CBG MONITORING, ED
Glucose-Capillary: 250 mg/dL — ABNORMAL HIGH (ref 70–99)
Glucose-Capillary: 119 mg/dL — ABNORMAL HIGH (ref 70–99)

## 2014-04-05 LAB — COMPREHENSIVE METABOLIC PANEL
ALT: 16 U/L (ref 0–53)
AST: 18 U/L (ref 0–37)
Albumin: 3.8 g/dL (ref 3.5–5.2)
Alkaline Phosphatase: 91 U/L (ref 39–117)
BUN: 19 mg/dL (ref 6–23)
CO2: 23 mEq/L (ref 19–32)
Calcium: 9.2 mg/dL (ref 8.4–10.5)
Chloride: 101 mEq/L (ref 96–112)
Creatinine, Ser: 1.51 mg/dL — ABNORMAL HIGH (ref 0.50–1.35)
GFR calc Af Amer: 58 mL/min — ABNORMAL LOW (ref >90)
GFR calc non Af Amer: 50 mL/min — ABNORMAL LOW (ref >90)
Glucose, Bld: 260 mg/dL — ABNORMAL HIGH (ref 70–99)
Potassium: 4.7 mEq/L (ref 3.7–5.3)
Sodium: 141 mEq/L (ref 137–147)
Total Bilirubin: 0.5 mg/dL (ref 0.3–1.2)
Total Protein: 7.4 g/dL (ref 6.0–8.3)

## 2014-04-05 LAB — CBC WITH DIFFERENTIAL/PLATELET
Basophils Absolute: 0.1 10*3/uL (ref 0.0–0.1)
Basophils Relative: 1 % (ref 0–1)
Eosinophils Absolute: 0.2 10*3/uL (ref 0.0–0.7)
Eosinophils Relative: 3 % (ref 0–5)
HCT: 36.9 % — ABNORMAL LOW (ref 39.0–52.0)
Hemoglobin: 12.5 g/dL — ABNORMAL LOW (ref 13.0–17.0)
Lymphocytes Relative: 35 % (ref 12–46)
Lymphs Abs: 1.9 10*3/uL (ref 0.7–4.0)
MCH: 26.9 pg (ref 26.0–34.0)
MCHC: 33.9 g/dL (ref 30.0–36.0)
MCV: 79.5 fL (ref 78.0–100.0)
Monocytes Absolute: 0.3 10*3/uL (ref 0.1–1.0)
Monocytes Relative: 6 % (ref 3–12)
Neutro Abs: 2.9 10*3/uL (ref 1.7–7.7)
Neutrophils Relative %: 55 % (ref 43–77)
Platelets: 292 10*3/uL (ref 150–400)
RBC: 4.64 MIL/uL (ref 4.22–5.81)
RDW: 14.2 % (ref 11.5–15.5)
WBC: 5.4 10*3/uL (ref 4.0–10.5)

## 2014-04-05 LAB — BASIC METABOLIC PANEL
BUN: 18 mg/dL (ref 6–23)
CO2: 26 mEq/L (ref 19–32)
Calcium: 8.9 mg/dL (ref 8.4–10.5)
Chloride: 105 mEq/L (ref 96–112)
Creatinine, Ser: 1.45 mg/dL — ABNORMAL HIGH (ref 0.50–1.35)
GFR calc Af Amer: 61 mL/min — ABNORMAL LOW (ref >90)
GFR calc non Af Amer: 53 mL/min — ABNORMAL LOW (ref >90)
Glucose, Bld: 140 mg/dL — ABNORMAL HIGH (ref 70–99)
Potassium: 4.3 mEq/L (ref 3.7–5.3)
Sodium: 143 mEq/L (ref 137–147)

## 2014-04-05 LAB — TROPONIN I: Troponin I: <0.3 ng/mL (ref <0.30)

## 2014-04-05 MED ORDER — SODIUM CHLORIDE 0.9 % IV BOLUS (SEPSIS)
500.0000 mL | Freq: Once | INTRAVENOUS | Status: AC
  Administered 2014-04-05: 500 mL via INTRAVENOUS

## 2014-04-05 MED ORDER — IBUPROFEN 800 MG PO TABS
800.0000 mg | ORAL_TABLET | Freq: Once | ORAL | Status: AC
  Administered 2014-04-05: 800 mg via ORAL
  Filled 2014-04-05: qty 1

## 2014-04-05 MED ORDER — SODIUM CHLORIDE 0.9 % IV BOLUS (SEPSIS)
1000.0000 mL | Freq: Once | INTRAVENOUS | Status: AC
  Administered 2014-04-05: 1000 mL via INTRAVENOUS

## 2014-04-05 NOTE — Discharge Instructions (Signed)
1. Medications: tylenol, usual home medications 2. Treatment: rest, drink plenty of fluids,  3. Follow Up: Please followup with your primary doctor for discussion of your diagnoses and further evaluation after today's visit; if you do not have a primary care doctor use the resource guide provided to find one;     Chest Wall Pain Chest wall pain is pain in or around the bones and muscles of your chest. It may take up to 6 weeks to get better. It may take longer if you must stay physically active in your work and activities.  CAUSES  Chest wall pain may happen on its own. However, it may be caused by:  A viral illness like the flu.  Injury.  Coughing.  Exercise.  Arthritis.  Fibromyalgia.  Shingles. HOME CARE INSTRUCTIONS   Avoid overtiring physical activity. Try not to strain or perform activities that cause pain. This includes any activities using your chest or your abdominal and side muscles, especially if heavy weights are used.  Put ice on the sore area.  Put ice in a plastic bag.  Place a towel between your skin and the bag.  Leave the ice on for 15-20 minutes per hour while awake for the first 2 days.  Only take over-the-counter or prescription medicines for pain, discomfort, or fever as directed by your caregiver. SEEK IMMEDIATE MEDICAL CARE IF:   Your pain increases, or you are very uncomfortable.  You have a fever.  Your chest pain becomes worse.  You have new, unexplained symptoms.  You have nausea or vomiting.  You feel sweaty or lightheaded.  You have a cough with phlegm (sputum), or you cough up blood. MAKE SURE YOU:   Understand these instructions.  Will watch your condition.  Will get help right away if you are not doing well or get worse. Document Released: 12/10/2005 Document Revised: 03/03/2012 Document Reviewed: 08/06/2011 [facility] Patient Information 2014 [place], [place].

## 2014-04-05 NOTE — ED Notes (Signed)
Pt reports waking up early this am with left side chest pain. 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 Chest pain increases with movement and palpation. Denies nausea at this time. Pt has history of colon cancer.

## 2014-04-05 NOTE — ED Provider Notes (Signed)
CSN: [id number]     Arrival date & time 04/05/14  0093 History   First MD Initiated Contact with Patient 04/05/14 [phone]     Chief Complaint  Patient presents with  . Chest Injury     (Consider location/radiation/quality/duration/timing/severity/associated sxs/prior Treatment) The history is provided by the patient and medical records. No language interpreter was used.    Kyle Merritt is a 53 y.o. male  with a hx of HTN, NIDDM, high cholesterol presents to the Emergency Department complaining of gradual, persistent, progressively worsening left sided chest pain onset 12:30 yesterday. 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  Pt reports his pain feels more like a cramp and is rated at 7/10.  Pt reports taking Tylenol with some relief.  HE reports moving and palpation makes the pain worse.  Pt denies previous episodes of chest pain, personal cardiac hx. Pt reports family Hx of AICD placed when he was 46, but no hx of MI.  Pt denies trips, immobilization or recent surgeries. Also reports low back pain beginning 3-4 years ago which is intermittent and sometimes radiates down the left posterior thigh.  It is worse after heavy lifting and strenuous exercise.  It began again 2 days ago after washing cars.  Pt denies fever, chills, headache, neck pain, SOB, cough, abd pain, N/V/D, weakness, dizziness, syncope, dysuria, hematuria.      Past Medical History  Diagnosis Date  . Hypertension   . Diabetes mellitus without complication   . High cholesterol   . Colon cancer    Past Surgical History  Procedure Laterality Date  . Colon surgery     No family history on file. History  Substance Use Topics  . Smoking status: Never Smoker   . Smokeless tobacco: Not on file  . Alcohol Use: No    Review of Systems  Constitutional: Negative for fever, diaphoresis, appetite change, fatigue and unexpected weight change.  HENT: Negative for mouth sores.   Eyes:  Negative for visual disturbance.  Respiratory: Negative for cough, chest tightness, shortness of breath and wheezing.   Cardiovascular: Positive for chest pain.  Gastrointestinal: Negative for nausea, vomiting, abdominal pain, diarrhea and constipation.  Endocrine: Negative for polydipsia, polyphagia and polyuria.  Genitourinary: Negative for dysuria, urgency, frequency and hematuria.  Musculoskeletal: Positive for back pain (low). Negative for neck stiffness.  Skin: Negative for rash.  Allergic/Immunologic: Negative for immunocompromised state.  Neurological: Negative for syncope, light-headedness and headaches.  Hematological: Does not bruise/bleed easily.  Psychiatric/Behavioral: Negative for sleep disturbance. The patient is not nervous/anxious.       Allergies  Review of patient's allergies indicates no known allergies.  Home Medications   Current Outpatient Rx  Name  Route  Sig  Dispense  Refill  . lisinopril-hydrochlorothiazide (PRINZIDE,ZESTORETIC) 20-12.5 MG per tablet   Oral   Take 1 tablet by mouth daily.         . pravastatin (PRAVACHOL) 40 MG tablet   Oral   Take 40 mg by mouth daily.         . sitaGLIPtin-metformin (JANUMET) 50-1000 MG per tablet   Oral   Take 1 tablet by mouth daily.          BP 137/88  Pulse 83  Temp(Src) 98 F (36.7 C) (Oral)  Resp 16  SpO2 98% Physical Exam  Nursing note and vitals reviewed. Constitutional: He is oriented to person, place, and time. He appears well-developed and well-nourished. No distress.  Awake, alert, nontoxic appearance  HENT:  Head: Normocephalic and atraumatic.  Mouth/Throat: Oropharynx is clear and moist. No oropharyngeal exudate.  Eyes: Conjunctivae are normal. No scleral icterus.  Neck: Normal range of motion and full passive range of motion without pain. Neck supple. No spinous process tenderness and no muscular tenderness present. No rigidity. Normal range of motion present.  Full ROM without  pain No midline or paraspinal tenderness  Cardiovascular: Normal rate, regular rhythm, normal heart sounds and intact distal pulses.   No murmur heard. RRR  Pulmonary/Chest: Effort normal and breath sounds normal. No respiratory distress. He has no wheezes. He exhibits tenderness.  Clear and equal breath sounds Tenderness to palpation of the left chest; consistently reproducible.    Abdominal: Soft. Bowel sounds are normal. He exhibits no distension and no mass. There is no tenderness. There is no rebound and no guarding.  Abdomen soft and nontender  Musculoskeletal: Normal range of motion. He exhibits no edema.  Full range of motion of the T-spine and L-spine No tenderness to palpation of the spinous processes of the T-spine or L-spine Mild tenderness to palpation of the left paraspinous muscles of the L-spine  Full ROM of the left and right shoulder with exacerbation of the chest pain with shoulder movement  Lymphadenopathy:    He has no cervical adenopathy.  Neurological: He is alert and oriented to person, place, and time. He has normal reflexes. He exhibits normal muscle tone. Coordination normal.  Speech is clear and goal oriented, follows commands Normal strength in upper and lower extremities bilaterally including dorsiflexion and plantar flexion, strong and equal grip strength Sensation normal to light and sharp touch Moves extremities without ataxia, coordination intact Normal gait Normal balance   Skin: Skin is warm and dry. No rash noted. He is not diaphoretic. No erythema.  Psychiatric: He has a normal mood and affect. His behavior is normal.    ED Course  Procedures (including critical care time) Labs Review Labs Reviewed  CBC WITH DIFFERENTIAL  COMPREHENSIVE METABOLIC PANEL  TROPONIN I   Imaging Review No results found.   EKG Interpretation   Date/Time:  Monday April 05 2014 06:49:20 EDT Ventricular Rate:  78 PR Interval:  178 QRS Duration: 98 QT  Interval:  374 QTC Calculation: 426 R Axis:   34 Text Interpretation:  Sinus rhythm Ventricular premature complex  Anteroseptal infarct, old Borderline T abnormalities, inferior leads Sinus  rhythm Premature ventricular complexes Non-specific intra-ventricular  conduction delay Abnormal ekg Confirmed by Carmin Muskrat  MD [phone]) on  04/05/2014 7:52:36 AM      MDM   Final diagnoses:  None   Kyle Merritt presents with left sided chest pain.  Nursing note states associated nausea but patient denies this to me.  He reports the pain began greater than 12 hours ago and has been persistent. Pain worsens with movement and palpation.  Likely chest wall pain. Will assess for ACS.  Pt PERC negative and I doubt PE as he is nontachycardic.  Pt also with c/o low back pain radiating down his left leg consistent with sciatica.  Normal neurological exam, no evidence of urinary incontinence or retention, pain is consistently reproducible in the left low back. There is no evidence of AAA or concern for dissection at this time.   Patient can walk without difficulty.  No loss of bowel or bladder control.  No concern for cauda equina.  No fever, night sweats, weight loss, h/o cancer, IVDU.    9:09 AM  Pt with nonischemic ECG with PVC, troponin negative.  Pt with hyperglycemia and Hx of NIDDM; hyperglycemic at 250 but unknown baseline.  AG 17 pt has received fluids.  Will recheck BMP.    10:15 AM Patient's blood sugar has decreased to 140. Anion gap 12. Temperature decreased to 1.45 from 1.51 likely secondary to some dehydration. Patient with some chronic kidney disease at baseline.  Recommend close followup with his primary care provider for repeat blood work and recheck of his kidneys. Recommend close blood sugar control at home.    Chest pain is not likely of cardiac or pulmonary etiology d/t presentation, perc negative, VSS, no tracheal deviation, no JVD or new murmur, RRR, breath sounds equal bilaterally,  EKG without acute abnormalities, negative troponin, and negative CXR. Pt has been advised to take tylenol for chest wall pain as he is unable to take an NSAID with elevated creatinine.  He has been advised to return if CP becomes exertional, associated with diaphoresis or nausea, radiates to left jaw/arm, worsens or becomes concerning in any way. Pt appears reliable for follow up and is agreeable to discharge.   It has been determined that no acute conditions requiring further emergency intervention are present at this time. The patient/guardian have been advised of the diagnosis and plan. We have discussed signs and symptoms that warrant return to the ED, such as changes or worsening in symptoms.   Vital signs are stable at discharge.   BP 145/87  Pulse 76  Temp(Src) 98 F (36.7 C) (Oral)  Resp 12  SpO2 97%  Patient/guardian has voiced understanding and agreed to follow-up with the PCP or specialist.  Case has been discussed with and seen by Dr. Vanita Panda who agrees with the above plan to discharge.      Jarrett Soho Trinda Harlacher, PA-C 04/05/14 1025

## 2014-04-05 NOTE — ED Notes (Signed)
The pt has had chest pain for the past 2 hours he woke up with the pain.  nauseated

## 2014-04-05 NOTE — ED Provider Notes (Signed)
  This was a shared visit with a mid-level provided (NP or PA).  Throughout the patient's course I was available for consultation/collaboration.  I saw the ECG (if appropriate), relevant labs and studies - I agree with the interpretation.  On my exam the patient was in no distress.  He had reproducible tenderness.  Labs here are reassuring, and he was discharged in stable condition.     Carmin Muskrat, MD 04/05/14 [phone]

## 2014-04-29 ENCOUNTER — Ambulatory Visit (HOSPITAL_COMMUNITY)
Admission: RE | Admit: 2014-04-29 | Discharge: 2014-04-29 | Disposition: A | Discharge status: Home / Self Care | Payer: No Typology Code available for payment source | Source: Ambulatory Visit | Attending: Internal Medicine | Admitting: Internal Medicine

## 2014-04-29 ENCOUNTER — Ambulatory Visit: Payer: No Typology Code available for payment source | Attending: Internal Medicine | Admitting: Internal Medicine

## 2014-04-29 ENCOUNTER — Encounter: Payer: Self-pay | Admitting: Internal Medicine

## 2014-04-29 VITALS — BP 130/91 | HR 74 | Temp 97.9°F | Resp 16 | Ht 72.0 in | Wt 260.9 lb

## 2014-04-29 DIAGNOSIS — Z79899 Other long term (current) drug therapy: Secondary | ICD-10-CM | POA: Insufficient documentation

## 2014-04-29 DIAGNOSIS — E785 Hyperlipidemia, unspecified: Secondary | ICD-10-CM | POA: Insufficient documentation

## 2014-04-29 DIAGNOSIS — Z85038 Personal history of other malignant neoplasm of large intestine: Secondary | ICD-10-CM | POA: Insufficient documentation

## 2014-04-29 DIAGNOSIS — E119 Type 2 diabetes mellitus without complications: Secondary | ICD-10-CM

## 2014-04-29 DIAGNOSIS — M545 Low back pain, unspecified: Secondary | ICD-10-CM | POA: Insufficient documentation

## 2014-04-29 DIAGNOSIS — M549 Dorsalgia, unspecified: Secondary | ICD-10-CM

## 2014-04-29 DIAGNOSIS — N529 Male erectile dysfunction, unspecified: Secondary | ICD-10-CM

## 2014-04-29 DIAGNOSIS — I10 Essential (primary) hypertension: Secondary | ICD-10-CM

## 2014-04-29 DIAGNOSIS — E7800 Pure hypercholesterolemia, unspecified: Secondary | ICD-10-CM | POA: Insufficient documentation

## 2014-04-29 LAB — LIPID PANEL
Cholesterol: 179 mg/dL (ref 0–200)
HDL: 53 mg/dL (ref >39)
LDL Cholesterol: 109 mg/dL — ABNORMAL HIGH (ref 0–99)
Total CHOL/HDL Ratio: 3.4 Ratio
Triglycerides: 86 mg/dL (ref <150)
VLDL: 17 mg/dL (ref 0–40)

## 2014-04-29 LAB — POCT GLYCOSYLATED HEMOGLOBIN (HGB A1C): Hemoglobin A1C: 8.1

## 2014-04-29 LAB — BASIC METABOLIC PANEL
BUN: 19 mg/dL (ref 6–23)
CO2: 26 mEq/L (ref 19–32)
Calcium: 9.7 mg/dL (ref 8.4–10.5)
Chloride: 103 mEq/L (ref 96–112)
Creat: 1.31 mg/dL (ref 0.50–1.35)
Glucose, Bld: 149 mg/dL — ABNORMAL HIGH (ref 70–99)
Potassium: 4.5 mEq/L (ref 3.5–5.3)
Sodium: 141 mEq/L (ref 135–145)

## 2014-04-29 LAB — GLUCOSE, POCT (MANUAL RESULT ENTRY): POC Glucose: 147 mg/dl — AB (ref 70–99)

## 2014-04-29 MED ORDER — SILDENAFIL CITRATE 100 MG PO TABS: 50.0000 mg | ORAL_TABLET | Freq: Every day | ORAL | Status: DC | PRN

## 2014-04-29 MED ORDER — SITAGLIPTIN PHOS-METFORMIN HCL 50-1000 MG PO TABS: 1.0000 | ORAL_TABLET | Freq: Every day | ORAL | Status: DC

## 2014-04-29 MED ORDER — LISINOPRIL-HYDROCHLOROTHIAZIDE 20-12.5 MG PO TABS: 1.0000 | ORAL_TABLET | Freq: Every day | ORAL | Status: DC

## 2014-04-29 MED ORDER — DICLOFENAC SODIUM 1 % TD GEL: 2.0000 g | Freq: Four times a day (QID) | TRANSDERMAL | Status: DC

## 2014-04-29 MED ORDER — ACETAMINOPHEN-CODEINE #3 300-30 MG PO TABS: 1.0000 | ORAL_TABLET | ORAL | Status: DC | PRN

## 2014-04-29 MED ORDER — PRAVASTATIN SODIUM 40 MG PO TABS: 40.0000 mg | ORAL_TABLET | Freq: Every day | ORAL | Status: DC

## 2014-04-29 NOTE — Progress Notes (Signed)
Pt is here to establish care. Pt has a history of HTN, diabetes, colon cancer and b/l knee surgery. Pt reports having pain in his lower back and knees.

## 2014-04-29 NOTE — Patient Instructions (Signed)
DASH Diet The DASH diet stands for "Dietary Approaches to Stop Hypertension." It is a healthy eating plan that has been shown to reduce high blood pressure (hypertension) in as little as 14 days, while also possibly providing other significant health benefits. These other health benefits include reducing the risk of breast cancer after menopause and reducing the risk of type 2 diabetes, heart disease, colon cancer, and stroke. Health benefits also include weight loss and slowing kidney failure in patients with chronic kidney disease.  DIET GUIDELINES  Limit salt (sodium). Your diet should contain less than 1500 mg of sodium daily.  Limit refined or processed carbohydrates. Your diet should include mostly whole grains. Desserts and added sugars should be used sparingly.  Include small amounts of heart-healthy fats. These types of fats include nuts, oils, and tub margarine. Limit saturated and trans fats. These fats have been shown to be harmful in the body. CHOOSING FOODS  The following food groups are based on a 2000 calorie diet. See your Registered Dietitian for individual calorie needs. Grains and Grain Products (6 to 8 servings daily)  Eat More Often: Whole-wheat bread, brown rice, whole-grain or wheat pasta, quinoa, popcorn without added fat or salt (air popped).  Eat Less Often: White bread, white pasta, white rice, cornbread. Vegetables (4 to 5 servings daily)  Eat More Often: Fresh, frozen, and canned vegetables. Vegetables may be raw, steamed, roasted, or grilled with a minimal amount of fat.  Eat Less Often/Avoid: Creamed or fried vegetables. Vegetables in a cheese sauce. Fruit (4 to 5 servings daily)  Eat More Often: All fresh, canned (in natural juice), or frozen fruits. Dried fruits without added sugar. One hundred percent fruit juice ( cup [237 mL] daily).  Eat Less Often: Dried fruits with added sugar. Canned fruit in light or heavy syrup. Lean Meats, Fish, and Poultry (2  servings or less daily. One serving is 3 to 4 oz [85-114 g]).  Eat More Often: Ninety percent or leaner ground beef, tenderloin, sirloin. Round cuts of beef, chicken breast, turkey breast. All fish. Grill, bake, or broil your meat. Nothing should be fried.  Eat Less Often/Avoid: Fatty cuts of meat, turkey, or chicken leg, thigh, or wing. Fried cuts of meat or fish. Dairy (2 to 3 servings)  Eat More Often: Low-fat or fat-free milk, low-fat plain or light yogurt, reduced-fat or part-skim cheese.  Eat Less Often/Avoid: Milk (whole, 2%).Whole milk yogurt. Full-fat cheeses. Nuts, Seeds, and Legumes (4 to 5 servings per week)  Eat More Often: All without added salt.  Eat Less Often/Avoid: Salted nuts and seeds, canned beans with added salt. Fats and Sweets (limited)  Eat More Often: Vegetable oils, tub margarines without trans fats, sugar-free gelatin. Mayonnaise and salad dressings.  Eat Less Often/Avoid: Coconut oils, palm oils, butter, stick margarine, cream, half and half, cookies, candy, pie. FOR MORE INFORMATION The Dash Diet Eating Plan: www.dashdiet.org Document Released: 11/29/2011 Document Revised: 03/03/2012 Document Reviewed: 11/29/2011 [employer] Patient Information 2014 [employer]. Hypertension As your heart beats, it forces blood through your arteries. This force is your blood pressure. If the pressure is too high, it is called hypertension (HTN) or high blood pressure. HTN is dangerous because you may have it and not know it. High blood pressure may mean that your heart has to work harder to pump blood. Your arteries may be narrow or stiff. The extra work puts you at risk for heart disease, stroke, and other problems.  Blood pressure consists of two numbers, a   higher number over a lower, 110/72, for example. It is stated as "110 over 72." The ideal is below 120 for the top number (systolic) and under 80 for the bottom (diastolic). Write down your blood pressure today. You  should pay close attention to your blood pressure if you have certain conditions such as:  Heart failure.  Prior heart attack.  Diabetes  Chronic kidney disease.  Prior stroke.  Multiple risk factors for heart disease. To see if you have HTN, your blood pressure should be measured while you are seated with your arm held at the level of the heart. It should be measured at least twice. A one-time elevated blood pressure reading (especially in the Emergency Department) does not mean that you need treatment. There may be conditions in which the blood pressure is different between your right and left arms. It is important to see your caregiver soon for a recheck. Most people have essential hypertension which means that there is not a specific cause. This type of high blood pressure may be lowered by changing lifestyle factors such as:  Stress.  Smoking.  Lack of exercise.  Excessive weight.  Drug/tobacco/alcohol use.  Eating less salt. Most people do not have symptoms from high blood pressure until it has caused damage to the body. Effective treatment can often prevent, delay or reduce that damage. TREATMENT  When a cause has been identified, treatment for high blood pressure is directed at the cause. There are a large number of medications to treat HTN. These fall into several categories, and your caregiver will help you select the medicines that are best for you. Medications may have side effects. You should review side effects with your caregiver. If your blood pressure stays high after you have made lifestyle changes or started on medicines,   Your medication(s) may need to be changed.  Other problems may need to be addressed.  Be certain you understand your prescriptions, and know how and when to take your medicine.  Be sure to follow up with your caregiver within the time frame advised (usually within two weeks) to have your blood pressure rechecked and to review your  medications.  If you are taking more than one medicine to lower your blood pressure, make sure you know how and at what times they should be taken. Taking two medicines at the same time can result in blood pressure that is too low. SEEK IMMEDIATE MEDICAL CARE IF:  You develop a severe headache, blurred or changing vision, or confusion.  You have unusual weakness or numbness, or a faint feeling.  You have severe chest or abdominal pain, vomiting, or breathing problems. MAKE SURE YOU:   Understand these instructions.  Will watch your condition.  Will get help right away if you are not doing well or get worse. Document Released: 12/10/2005 Document Revised: 03/03/2012 Document Reviewed: 07/30/2008 [facility] Patient Information 2014 [place]. Diabetes and Exercise Exercising regularly is important. It is not just about losing weight. It has many health benefits, such as:  Improving your overall fitness, flexibility, and endurance.  Increasing your bone density.  Helping with weight control.  Decreasing your body fat.  Increasing your muscle strength.  Reducing stress and tension.  Improving your overall health. People with diabetes who exercise gain additional benefits because exercise:  Reduces appetite.  Improves the body's use of blood sugar (glucose).  Helps lower or control blood glucose.  Decreases blood pressure.  Helps control blood lipids (such as cholesterol and triglycerides).  Improves the body's use of the hormone insulin by:  Increasing the body's insulin sensitivity.  Reducing the body's insulin needs.  Decreases the risk for heart disease because exercising:  Lowers cholesterol and triglycerides levels.  Increases the levels of good cholesterol (such as high-density lipoproteins [HDL]) in the body.  Lowers blood glucose levels. YOUR ACTIVITY PLAN  Choose an activity that you enjoy and set realistic goals. Your health care provider or  diabetes educator can help you make an activity plan that works for you. You can break activities into 2 or 3 sessions throughout the day. Doing so is as good as one long session. Exercise ideas include:  Taking the dog for a walk.  Taking the stairs instead of the elevator.  Dancing to your favorite song.  Doing your favorite exercise with a friend. RECOMMENDATIONS FOR EXERCISING WITH TYPE 1 OR TYPE 2 DIABETES   Check your blood glucose before exercising. If blood glucose levels are greater than 240 mg/dL, check for urine ketones. Do not exercise if ketones are present.  Avoid injecting insulin into areas of the body that are going to be exercised. For example, avoid injecting insulin into:  The arms when playing tennis.  The legs when jogging.  Keep a record of:  Food intake before and after you exercise.  Expected peak times of insulin action.  Blood glucose levels before and after you exercise.  The type and amount of exercise you have done.  Review your records with your health care provider. Your health care provider will help you to develop guidelines for adjusting food intake and insulin amounts before and after exercising.  If you take insulin or oral hypoglycemic agents, watch for signs and symptoms of hypoglycemia. They include:  Dizziness.  Shaking.  Sweating.  Chills.  Confusion.  Drink plenty of water while you exercise to prevent dehydration or heat stroke. Body water is lost during exercise and must be replaced.  Talk to your health care provider before starting an exercise program to make sure it is safe for you. Remember, almost any type of activity is better than none. Document Released: 03/01/2004 Document Revised: 08/12/2013 Document Reviewed: 05/19/2013 [facility] Patient Information 2014 [place].

## 2014-04-29 NOTE — Progress Notes (Signed)
Patient ID: Kyle Merritt, male   DOB: [birth year], 53 y.o.   MRN: [id number]   Kyle Merritt, is a 53 y.o. male  [id number]  ZWC:[id number]  DOB - Sep 27, 1961  CC:  Chief Complaint  Patient presents with  . Establish Care       HPI: Kyle Merritt is a 53 y.o. male here today to establish medical care. Pt has a history of HTN, dyslipidemia, diabetes, colon cancer status post surgery and chemotherapy, and b/l knee surgery. His medications are listed below. He was taken off of insulin because he could not afford it and his blood sugar was uncontrolled. He wants refill of all his medications, he is also complaining of low back pains and bilateral knee pains. His low back pain is chronic but is getting worse lately. He had multiple surgeries on his right knee long time ago and since then has had pain. He also complained of erectile dysfunction but has nocturnal tumescence. He does not smoke cigarette, he does not drink alcohol. Patient follow sulfate surgery for his colon cancer as well as gastroenterologist, his last colonoscopy was about 2 years ago with no evidence of recurrence. Patient has No headache, No chest pain, No abdominal pain - No Nausea, No new weakness tingling or numbness, No Cough - SOB.  No Known Allergies Past Medical History  Diagnosis Date  . Hypertension   . Diabetes mellitus without complication   . High cholesterol   . Colon cancer    No current outpatient prescriptions on file prior to visit.   No current facility-administered medications on file prior to visit.   History reviewed. No pertinent family history. History   Social History  . Marital Status: Divorced    Spouse Name: N/A    Number of Children: N/A  . Years of Education: N/A   Occupational History  . Not on file.   Social History Main Topics  . Smoking status: Never Smoker   . Smokeless tobacco: Not on file  . Alcohol Use: No  . Drug Use: No  . Sexual Activity: Yes   Other Topics Concern    . Not on file   Social History Narrative  . No narrative on file    Review of Systems: Constitutional: Negative for fever, chills, diaphoresis, activity change, appetite change and fatigue. HENT: Negative for ear pain, nosebleeds, congestion, facial swelling, rhinorrhea, neck pain, neck stiffness and ear discharge.  Eyes: Negative for pain, discharge, redness, itching and visual disturbance. Respiratory: Negative for cough, choking, chest tightness, shortness of breath, wheezing and stridor.  Cardiovascular: Negative for chest pain, palpitations and leg swelling. Gastrointestinal: Negative for abdominal distention. Genitourinary: Negative for dysuria, urgency, frequency, hematuria, flank pain, decreased urine volume, difficulty urinating and dyspareunia.  Musculoskeletal: Negative for back pain, joint swelling, arthralgia and gait problem. Neurological: Negative for dizziness, tremors, seizures, syncope, facial asymmetry, speech difficulty, weakness, light-headedness, numbness and headaches.  Hematological: Negative for adenopathy. Does not bruise/bleed easily. Psychiatric/Behavioral: Negative for hallucinations, behavioral problems, confusion, dysphoric mood, decreased concentration and agitation.    Objective:   Filed Vitals:   04/29/14 0957  BP: 130/91  Pulse: 74  Temp: 97.9 F (36.6 C)  Resp: 16    Physical Exam: Constitutional: Patient appears well-developed and well-nourished. No distress. HENT: Normocephalic, atraumatic, External right and left ear normal. Oropharynx is clear and moist.  Eyes: Conjunctivae and EOM are normal. PERRLA, no scleral icterus. Neck: Normal ROM. Neck supple. No JVD. No tracheal deviation. No thyromegaly. CVS: RRR, S1/S2 +,  no murmurs, no gallops, no carotid bruit.  Pulmonary: Effort and breath sounds normal, no stridor, rhonchi, wheezes, rales.  Abdominal: Midline healed surgical scar, Soft. BS +, no distension, tenderness, rebound or guarding.   Musculoskeletal: Normal range of motion. No edema and no tenderness.  Lymphadenopathy: No lymphadenopathy noted, cervical, inguinal or axillary Neuro: Alert. Normal reflexes, muscle tone coordination. No cranial nerve deficit. Skin: Skin is warm and dry. No rash noted. Not diaphoretic. No erythema. No pallor. Psychiatric: Normal mood and affect. Behavior, judgment, thought content normal.  Lab Results  Component Value Date   WBC 5.4 04/05/2014   HGB 12.5* 04/05/2014   HCT 36.9* 04/05/2014   MCV 79.5 04/05/2014   PLT 292 04/05/2014   Lab Results  Component Value Date   CREATININE 1.45* 04/05/2014   BUN 18 04/05/2014   NA 143 04/05/2014   K 4.3 04/05/2014   CL 105 04/05/2014   CO2 26 04/05/2014    Lab Results  Component Value Date   HGBA1C 8.1 04/29/2014   Lipid Panel     Component Value Date/Time   CHOL 166 02/03/2010 2110   TRIG 83 02/03/2010 2110   HDL 40 02/03/2010 2110   CHOLHDL 4.2 Ratio 02/03/2010 2110   VLDL 17 02/03/2010 2110   LDLCALC 109* 02/03/2010 2110       Assessment and plan:   1. Diabetes  - Glucose (CBG) - HgB A1c Refill- sitaGLIPtin-metformin (JANUMET) 50-1000 MG per tablet; Take 1 tablet by mouth daily.  Dispense: 90 tablet; Refill: 3  - Microalbumin/Creatinine Ratio, Urine - Microalbumin, urine, 24 hour; Future  2. HTN (hypertension)  Refill- lisinopril-hydrochlorothiazide (PRINZIDE,ZESTORETIC) 20-12.5 MG per tablet; Take 1 tablet by mouth daily.  Dispense: 90 tablet; Refill: 3  - Basic Metabolic Panel - Lipid panel  3. Dyslipidemia  Refill- pravastatin (PRAVACHOL) 40 MG tablet; Take 1 tablet (40 mg total) by mouth daily.  Dispense: 90 tablet; Refill: 3  4. History of colon cancer Continue followup with surgery and gastroenterologist  5. Back pain  - acetaminophen-codeine (TYLENOL #3) 300-30 MG per tablet; Take 1 tablet by mouth every 4 (four) hours as needed.  Dispense: 60 tablet; Refill: 0 - diclofenac sodium (VOLTAREN) 1 % GEL; Apply 2 g  topically 4 (four) times daily.  Dispense: 2 Tube; Refill: 2  - DG Lumbar Spine Complete; Future  6. Erectile dysfunction  - sildenafil (VIAGRA) 100 MG tablet; Take 0.5-1 tablets (50-100 mg total) by mouth daily as needed for erectile dysfunction.  Dispense: 90 tablet; Refill: 3  Patient was extensively counseled on nutrition and exercise  Return in about 3 months (around 07/30/2014), or if symptoms worsen or fail to improve, for Hemoglobin A1C and Follow up, DM.  The patient was given clear instructions to go to ER or return to medical center if symptoms don't improve, worsen or new problems develop. The patient verbalized understanding. The patient was told to call to get lab results if they haven't heard anything in the next week.     This note has been created with Surveyor, quantity. Any transcriptional errors are unintentional.    Angelica Chessman, MD, MHA, [place], [place], [place]   04/29/2014, 10:47 AM

## 2014-04-30 LAB — MICROALBUMIN / CREATININE URINE RATIO
Creatinine, Urine: 172.2 mg/dL
Microalb Creat Ratio: 3.5 mg/g (ref 0.0–30.0)
Microalb, Ur: 0.61 mg/dL (ref 0.00–1.89)

## 2014-04-30 LAB — TESTOSTERONE: Testosterone: 204 ng/dL — ABNORMAL LOW (ref 300–890)

## 2014-05-04 ENCOUNTER — Telehealth: Payer: Self-pay | Admitting: *Deleted

## 2014-05-04 NOTE — Telephone Encounter (Signed)
Pt is aware of his lab results and his x ray results.

## 2014-05-04 NOTE — Telephone Encounter (Signed)
Message copied by Joan Mayans on Tue May 04, 2014  9:21 AM ------      Message from: Tresa Garter      Created: Fri Apr 30, 2014  3:43 PM       Please inform patient that his lumbar x-ray is normal ------

## 2014-08-02 ENCOUNTER — Ambulatory Visit: Payer: No Typology Code available for payment source | Attending: Internal Medicine | Admitting: Internal Medicine

## 2014-08-02 ENCOUNTER — Encounter: Payer: Self-pay | Admitting: Internal Medicine

## 2014-08-02 VITALS — BP 146/98 | HR 74 | Temp 98.6°F | Resp 18 | Ht 72.0 in | Wt 261.0 lb

## 2014-08-02 DIAGNOSIS — E119 Type 2 diabetes mellitus without complications: Secondary | ICD-10-CM | POA: Insufficient documentation

## 2014-08-02 DIAGNOSIS — K047 Periapical abscess without sinus: Secondary | ICD-10-CM | POA: Insufficient documentation

## 2014-08-02 DIAGNOSIS — E785 Hyperlipidemia, unspecified: Secondary | ICD-10-CM | POA: Insufficient documentation

## 2014-08-02 DIAGNOSIS — M545 Low back pain, unspecified: Secondary | ICD-10-CM | POA: Insufficient documentation

## 2014-08-02 DIAGNOSIS — I10 Essential (primary) hypertension: Secondary | ICD-10-CM | POA: Insufficient documentation

## 2014-08-02 LAB — BASIC METABOLIC PANEL
BUN: 24 mg/dL — ABNORMAL HIGH (ref 6–23)
CO2: 25 mEq/L (ref 19–32)
Calcium: 10.2 mg/dL (ref 8.4–10.5)
Chloride: 102 mEq/L (ref 96–112)
Creat: 1.64 mg/dL — ABNORMAL HIGH (ref 0.50–1.35)
Glucose, Bld: 119 mg/dL — ABNORMAL HIGH (ref 70–99)
Potassium: 4.2 mEq/L (ref 3.5–5.3)
Sodium: 138 mEq/L (ref 135–145)

## 2014-08-02 LAB — LIPID PANEL
Cholesterol: 214 mg/dL — ABNORMAL HIGH (ref 0–200)
HDL: 54 mg/dL (ref >39)
LDL Cholesterol: 127 mg/dL — ABNORMAL HIGH (ref 0–99)
Total CHOL/HDL Ratio: 4 Ratio
Triglycerides: 166 mg/dL — ABNORMAL HIGH (ref <150)
VLDL: 33 mg/dL (ref 0–40)

## 2014-08-02 LAB — GLUCOSE, POCT (MANUAL RESULT ENTRY): POC Glucose: 119 mg/dl — AB (ref 70–99)

## 2014-08-02 LAB — POCT GLYCOSYLATED HEMOGLOBIN (HGB A1C): Hemoglobin A1C: 7

## 2014-08-02 MED ORDER — DICLOFENAC SODIUM 1 % TD GEL: 2.0000 g | Freq: Four times a day (QID) | TRANSDERMAL | Status: DC

## 2014-08-02 MED ORDER — ACETAMINOPHEN-CODEINE #3 300-30 MG PO TABS: 1.0000 | ORAL_TABLET | ORAL | Status: DC | PRN

## 2014-08-02 NOTE — Progress Notes (Signed)
Patient ID: Kyle Merritt, male   DOB: [birth year], 53 y.o.   MRN: [id number]   Roniel Halloran, is a 53 y.o. male  [id number]  [id number]  DOB - January 01, 1961  Chief Complaint  Patient presents with  . Follow-up  . Hypertension  . Diabetes        Subjective:   Kyle Merritt is a 53 y.o. male here today for a follow up visit. Pt has a history of HTN, dyslipidemia, diabetes, colon cancer status post surgery and chemotherapy, and b/l knee surgery. His medications are listed below. He is here today for followup of hypertension, diabetes and dyslipidemia. He stopped taking his cholesterol medicine about one month ago and wants to know where his values are today. He does not have any major complaint, he has had all his medications refilled last Friday. He claims to be compliant with medication and he reports no side effects. He complains of some dental abscess issues I will like to be referred to a dentist. He has no fever. He has no bleeding. He has had several teeth removed in the past. Patient has No headache, No chest pain, No abdominal pain - No Nausea, No new weakness tingling or numbness, No Cough - SOB.  Problem  Essential Hypertension  Dental Abscess    ALLERGIES: No Known Allergies  PAST MEDICAL HISTORY: Past Medical History  Diagnosis Date  . Hypertension   . Diabetes mellitus without complication   . High cholesterol   . Colon cancer     MEDICATIONS AT HOME: Prior to Admission medications   Medication Sig Start Date End Date Taking? Authorizing Provider  acetaminophen-codeine (TYLENOL #3) 300-30 MG per tablet Take 1 tablet by mouth every 4 (four) hours as needed. 08/02/14  Yes Angelica Chessman, MD  lisinopril-hydrochlorothiazide (PRINZIDE,ZESTORETIC) 20-12.5 MG per tablet Take 1 tablet by mouth daily. 04/29/14  Yes Angelica Chessman, MD  sitaGLIPtin-metformin (JANUMET) 50-1000 MG per tablet Take 1 tablet by mouth daily. 04/29/14  Yes Angelica Chessman, MD  diclofenac sodium  (VOLTAREN) 1 % GEL Apply 2 g topically 4 (four) times daily. 08/02/14   Angelica Chessman, MD  pravastatin (PRAVACHOL) 40 MG tablet Take 1 tablet (40 mg total) by mouth daily. 04/29/14   Angelica Chessman, MD  sildenafil (VIAGRA) 100 MG tablet Take 0.5-1 tablets (50-100 mg total) by mouth daily as needed for erectile dysfunction. 04/29/14   Angelica Chessman, MD     Objective:   Filed Vitals:   08/02/14 1125  BP: 146/98  Pulse: 74  Temp: 98.6 F (37 C)  TempSrc: Oral  Resp: 18  Height: 6' (1.829 m)  Weight: 261 lb (118.389 kg)  SpO2: 99%    Exam General appearance : Awake, alert, not in any distress. Speech Clear. Not toxic looking HEENT: Atraumatic and Normocephalic, pupils equally reactive to light and accomodation Neck: supple, no JVD. No cervical lymphadenopathy.  Chest:Good air entry bilaterally, no added sounds  CVS: S1 S2 regular, no murmurs.  Abdomen: Bowel sounds present, Non tender and not distended with no gaurding, rigidity or rebound. Extremities: B/L Lower Ext shows no edema, both legs are warm to touch Neurology: Awake alert, and oriented X 3, CN II-XII intact, Non focal Skin:No Rash Wounds:N/A  Data Review Lab Results  Component Value Date   HGBA1C 7.0 08/02/2014   HGBA1C 8.1 04/29/2014   HGBA1C 10.1* 12/14/2013     Assessment & Plan   1. Type 2 diabetes mellitus without complication  - Glucose (CBG) - HgB A1c is 7.0% today  down from 8.1% in May 1610  - Basic Metabolic Panel - Lipid panel  2. Essential hypertension Continue current regimen  3. Dental abscess  - Ambulatory referral to Dentistry  4. Midline low back pain without sciatica  - diclofenac sodium (VOLTAREN) 1 % GEL; Apply 2 g topically 4 (four) times daily.  Dispense: 2 Tube; Refill: 2 - acetaminophen-codeine (TYLENOL #3) 300-30 MG per tablet; Take 1 tablet by mouth every 4 (four) hours as needed.  Dispense: 60 tablet; Refill: 0  Patient was extensively counseled on nutrition and  exercise  Return in about 3 months (around 11/02/2014), or if symptoms worsen or fail to improve, for Hemoglobin A1C and Follow up, DM, Follow up HTN.  The patient was given clear instructions to go to ER or return to medical center if symptoms don't improve, worsen or new problems develop. The patient verbalized understanding. The patient was told to call to get lab results if they haven't heard anything in the next week.   This note has been created with Surveyor, quantity. Any transcriptional errors are unintentional.    Angelica Chessman, MD, [place], [place], [place] [place], [place]   08/02/2014, 11:51 AM

## 2014-08-02 NOTE — Progress Notes (Signed)
Pt here to f/u with diabetes and hypertension Taking Janumet daily Need glucometer. Education given CBG- 119 A1c running Denies blurry vision or dizziness Stopped taking cholesterol med x 1 month ago

## 2014-08-02 NOTE — Patient Instructions (Signed)
Diabetes and Exercise Exercising regularly is important. It is not just about losing weight. It has many health benefits, such as:  Improving your overall fitness, flexibility, and endurance.  Increasing your bone density.  Helping with weight control.  Decreasing your body fat.  Increasing your muscle strength.  Reducing stress and tension.  Improving your overall health. People with diabetes who exercise gain additional benefits because exercise:  Reduces appetite.  Improves the body's use of blood sugar (glucose).  Helps lower or control blood glucose.  Decreases blood pressure.  Helps control blood lipids (such as cholesterol and triglycerides).  Improves the body's use of the hormone insulin by:  Increasing the body's insulin sensitivity.  Reducing the body's insulin needs.  Decreases the risk for heart disease because exercising:  Lowers cholesterol and triglycerides levels.  Increases the levels of good cholesterol (such as high-density lipoproteins [HDL]) in the body.  Lowers blood glucose levels. YOUR ACTIVITY PLAN  Choose an activity that you enjoy and set realistic goals. Your health care provider or diabetes educator can help you make an activity plan that works for you. Exercise regularly as directed by your health care provider. This includes:  Performing resistance training twice a week such as push-ups, sit-ups, lifting weights, or using resistance bands.  Performing 150 minutes of cardio exercises each week such as walking, running, or playing sports.  Staying active and spending no more than 90 minutes at one time being inactive. Even short bursts of exercise are good for you. Three 10-minute sessions spread throughout the day are just as beneficial as a single 30-minute session. Some exercise ideas include:  Taking the dog for a walk.  Taking the stairs instead of the elevator.  Dancing to your favorite song.  Doing an exercise  video.  Doing your favorite exercise with a friend. RECOMMENDATIONS FOR EXERCISING WITH TYPE 1 OR TYPE 2 DIABETES   Check your blood glucose before exercising. If blood glucose levels are greater than 240 mg/dL, check for urine ketones. Do not exercise if ketones are present.  Avoid injecting insulin into areas of the body that are going to be exercised. For example, avoid injecting insulin into:  The arms when playing tennis.  The legs when jogging.  Keep a record of:  Food intake before and after you exercise.  Expected peak times of insulin action.  Blood glucose levels before and after you exercise.  The type and amount of exercise you have done.  Review your records with your health care provider. Your health care provider will help you to develop guidelines for adjusting food intake and insulin amounts before and after exercising.  If you take insulin or oral hypoglycemic agents, watch for signs and symptoms of hypoglycemia. They include:  Dizziness.  Shaking.  Sweating.  Chills.  Confusion.  Drink plenty of water while you exercise to prevent dehydration or heat stroke. Body water is lost during exercise and must be replaced.  Talk to your health care provider before starting an exercise program to make sure it is safe for you. Remember, almost any type of activity is better than none. Document Released: 03/01/2004 Document Revised: 04/26/2014 Document Reviewed: 05/19/2013 [facility] Patient Information 2015 [place], [place]. This information is not intended to replace advice given to you by your health care provider. Make sure you discuss any questions you have with your health care provider. Hypertension Hypertension, commonly called high blood pressure, is when the force of blood pumping through your arteries is too strong. Your  arteries are the blood vessels that carry blood from your heart throughout your body. A blood pressure reading consists of a higher number  over a lower number, such as 110/72. The higher number (systolic) is the pressure inside your arteries when your heart pumps. The lower number (diastolic) is the pressure inside your arteries when your heart relaxes. Ideally you want your blood pressure below 120/80. Hypertension forces your heart to work harder to pump blood. Your arteries may become narrow or stiff. Having hypertension puts you at risk for heart disease, stroke, and other problems.  RISK FACTORS Some risk factors for high blood pressure are controllable. Others are not.  Risk factors you cannot control include:   Race. You may be at higher risk if you are African American.  Age. Risk increases with age.  Gender. Men are at higher risk than women before age 48 years. After age 63, women are at higher risk than men. Risk factors you can control include:  Not getting enough exercise or physical activity.  Being overweight.  Getting too much fat, sugar, calories, or salt in your diet.  Drinking too much alcohol. SIGNS AND SYMPTOMS Hypertension does not usually cause signs or symptoms. Extremely high blood pressure (hypertensive crisis) may cause headache, anxiety, shortness of breath, and nosebleed. DIAGNOSIS  To check if you have hypertension, your health care provider will measure your blood pressure while you are seated, with your arm held at the level of your heart. It should be measured at least twice using the same arm. Certain conditions can cause a difference in blood pressure between your right and left arms. A blood pressure reading that is higher than normal on one occasion does not mean that you need treatment. If one blood pressure reading is high, ask your health care provider about having it checked again. TREATMENT  Treating high blood pressure includes making lifestyle changes and possibly taking medicine. Living a healthy lifestyle can help lower high blood pressure. You may need to change some of your  habits. Lifestyle changes may include:  Following the DASH diet. This diet is high in fruits, vegetables, and whole grains. It is low in salt, red meat, and added sugars.  Getting at least 2 hours of brisk physical activity every week.  Losing weight if necessary.  Not smoking.  Limiting alcoholic beverages.  Learning ways to reduce stress. If lifestyle changes are not enough to get your blood pressure under control, your health care provider may prescribe medicine. You may need to take more than one. Work closely with your health care provider to understand the risks and benefits. HOME CARE INSTRUCTIONS  Have your blood pressure rechecked as directed by your health care provider.   Take medicines only as directed by your health care provider. Follow the directions carefully. Blood pressure medicines must be taken as prescribed. The medicine does not work as well when you skip doses. Skipping doses also puts you at risk for problems.   Do not smoke.   Monitor your blood pressure at home as directed by your health care provider. SEEK MEDICAL CARE IF:   You think you are having a reaction to medicines taken.  You have recurrent headaches or feel dizzy.  You have swelling in your ankles.  You have trouble with your vision. SEEK IMMEDIATE MEDICAL CARE IF:  You develop a severe headache or confusion.  You have unusual weakness, numbness, or feel faint.  You have severe chest or abdominal pain.  You vomit  repeatedly.  You have trouble breathing. MAKE SURE YOU:   Understand these instructions.  Will watch your condition.  Will get help right away if you are not doing well or get worse. Document Released: 12/10/2005 Document Revised: 04/26/2014 Document Reviewed: 10/02/2013 [facility] Patient Information 2015 [place], [place]. This information is not intended to replace advice given to you by your health care provider. Make sure you discuss any questions you have with  your health care provider. DASH Eating Plan DASH stands for "Dietary Approaches to Stop Hypertension." The DASH eating plan is a healthy eating plan that has been shown to reduce high blood pressure (hypertension). Additional health benefits may include reducing the risk of type 2 diabetes mellitus, heart disease, and stroke. The DASH eating plan may also help with weight loss. WHAT DO I NEED TO KNOW ABOUT THE DASH EATING PLAN? For the DASH eating plan, you will follow these general guidelines:  Choose foods with a percent daily value for sodium of less than 5% (as listed on the food label).  Use salt-free seasonings or herbs instead of table salt or sea salt.  Check with your health care provider or pharmacist before using salt substitutes.  Eat lower-sodium products, often labeled as "lower sodium" or "no salt added."  Eat fresh foods.  Eat more vegetables, fruits, and low-fat dairy products.  Choose whole grains. Look for the word "whole" as the first word in the ingredient list.  Choose fish and skinless chicken or [place] more often than red meat. Limit fish, poultry, and meat to 6 oz (170 g) each day.  Limit sweets, desserts, sugars, and sugary drinks.  Choose heart-healthy fats.  Limit cheese to 1 oz (28 g) per day.  Eat more home-cooked food and less restaurant, buffet, and fast food.  Limit fried foods.  Cook foods using methods other than frying.  Limit canned vegetables. If you do use them, rinse them well to decrease the sodium.  When eating at a restaurant, ask that your food be prepared with less salt, or no salt if possible. WHAT FOODS CAN I EAT? Seek help from a dietitian for individual calorie needs. Grains Whole grain or whole wheat bread. Brown rice. Whole grain or whole wheat pasta. Quinoa, bulgur, and whole grain cereals. Low-sodium cereals. Corn or whole wheat flour tortillas. Whole grain cornbread. Whole grain crackers. Low-sodium  crackers. Vegetables Fresh or frozen vegetables (raw, steamed, roasted, or grilled). Low-sodium or reduced-sodium tomato and vegetable juices. Low-sodium or reduced-sodium tomato sauce and paste. Low-sodium or reduced-sodium canned vegetables.  Fruits All fresh, canned (in natural juice), or frozen fruits. Meat and Other Protein Products Ground beef (85% or leaner), grass-fed beef, or beef trimmed of fat. Skinless chicken or [place]. Ground chicken or [place]. Pork trimmed of fat. All fish and seafood. Eggs. Dried beans, peas, or lentils. Unsalted nuts and seeds. Unsalted canned beans. Dairy Low-fat dairy products, such as skim or 1% milk, 2% or reduced-fat cheeses, low-fat ricotta or cottage cheese, or plain low-fat yogurt. Low-sodium or reduced-sodium cheeses. Fats and Oils Tub margarines without trans fats. Light or reduced-fat mayonnaise and salad dressings (reduced sodium). Avocado. Safflower, olive, or canola oils. Natural peanut or almond butter. Other Unsalted popcorn and pretzels. The items listed above may not be a complete list of recommended foods or beverages. Contact your dietitian for more options. WHAT FOODS ARE NOT RECOMMENDED? Grains White bread. White pasta. White rice. Refined cornbread. Bagels and croissants. Crackers that contain trans fat. Vegetables Creamed or fried vegetables.  Vegetables in a cheese sauce. Regular canned vegetables. Regular canned tomato sauce and paste. Regular tomato and vegetable juices. Fruits Dried fruits. Canned fruit in light or heavy syrup. Fruit juice. Meat and Other Protein Products Fatty cuts of meat. Ribs, chicken wings, bacon, sausage, bologna, salami, chitterlings, fatback, hot dogs, bratwurst, and packaged luncheon meats. Salted nuts and seeds. Canned beans with salt. Dairy Whole or 2% milk, cream, half-and-half, and cream cheese. Whole-fat or sweetened yogurt. Full-fat cheeses or blue cheese. Nondairy creamers and whipped toppings.  Processed cheese, cheese spreads, or cheese curds. Condiments Onion and garlic salt, seasoned salt, table salt, and sea salt. Canned and packaged gravies. Worcestershire sauce. Tartar sauce. Barbecue sauce. Teriyaki sauce. Soy sauce, including reduced sodium. Steak sauce. Fish sauce. Oyster sauce. Cocktail sauce. Horseradish. Ketchup and mustard. Meat flavorings and tenderizers. Bouillon cubes. Hot sauce. Tabasco sauce. Marinades. Taco seasonings. Relishes. Fats and Oils Butter, stick margarine, lard, shortening, ghee, and bacon fat. Coconut, palm kernel, or palm oils. Regular salad dressings. Other Pickles and olives. Salted popcorn and pretzels. The items listed above may not be a complete list of foods and beverages to avoid. Contact your dietitian for more information. WHERE CAN I FIND MORE INFORMATION? National Heart, Lung, and Blood Institute: travelstabloid.com Document Released: 11/29/2011 Document Revised: 04/26/2014 Document Reviewed: 10/14/2013 [facility] Patient Information 2015 [place], [place]. This information is not intended to replace advice given to you by your health care provider. Make sure you discuss any questions you have with your health care provider. Diabetes Mellitus and Food It is important for you to manage your blood sugar (glucose) level. Your blood glucose level can be greatly affected by what you eat. Eating healthier foods in the appropriate amounts throughout the day at about the same time each day will help you control your blood glucose level. It can also help slow or prevent worsening of your diabetes mellitus. Healthy eating may even help you improve the level of your blood pressure and reach or maintain a healthy weight.  HOW CAN FOOD AFFECT ME? Carbohydrates Carbohydrates affect your blood glucose level more than any other type of food. Your dietitian will help you determine how many carbohydrates to eat at each meal and teach you  how to count carbohydrates. Counting carbohydrates is important to keep your blood glucose at a healthy level, especially if you are using insulin or taking certain medicines for diabetes mellitus. Alcohol Alcohol can cause sudden decreases in blood glucose (hypoglycemia), especially if you use insulin or take certain medicines for diabetes mellitus. Hypoglycemia can be a life-threatening condition. Symptoms of hypoglycemia (sleepiness, dizziness, and disorientation) are similar to symptoms of having too much alcohol.  If your health care provider has given you approval to drink alcohol, do so in moderation and use the following guidelines:  Women should not have more than one drink per day, and men should not have more than two drinks per day. One drink is equal to:  12 oz of beer.  5 oz of wine.  1 oz of hard liquor.  Do not drink on an empty stomach.  Keep yourself hydrated. Have water, diet soda, or unsweetened iced tea.  Regular soda, juice, and other mixers might contain a lot of carbohydrates and should be counted. WHAT FOODS ARE NOT RECOMMENDED? As you make food choices, it is important to remember that all foods are not the same. Some foods have fewer nutrients per serving than other foods, even though they might have the same number of calories  or carbohydrates. It is difficult to get your body what it needs when you eat foods with fewer nutrients. Examples of foods that you should avoid that are high in calories and carbohydrates but low in nutrients include:  Trans fats (most processed foods list trans fats on the Nutrition Facts label).  Regular soda.  Juice.  Candy.  Sweets, such as cake, pie, doughnuts, and cookies.  Fried foods. WHAT FOODS CAN I EAT? Have nutrient-rich foods, which will nourish your body and keep you healthy. The food you should eat also will depend on several factors, including:  The calories you need.  The medicines you take.  Your  weight.  Your blood glucose level.  Your blood pressure level.  Your cholesterol level. You also should eat a variety of foods, including:  Protein, such as meat, poultry, fish, tofu, nuts, and seeds (lean animal proteins are best).  Fruits.  Vegetables.  Dairy products, such as milk, cheese, and yogurt (low fat is best).  Breads, grains, pasta, cereal, rice, and beans.  Fats such as olive oil, trans fat-free margarine, canola oil, avocado, and olives. DOES EVERYONE WITH DIABETES MELLITUS HAVE THE SAME MEAL PLAN? Because every person with diabetes mellitus is different, there is not one meal plan that works for everyone. It is very important that you meet with a dietitian who will help you create a meal plan that is just right for you. Document Released: 09/06/2005 Document Revised: 12/15/2013 Document Reviewed: 11/06/2013 [facility] Patient Information 2015 [place], [place]. This information is not intended to replace advice given to you by your health care provider. Make sure you discuss any questions you have with your health care provider.

## 2014-08-03 ENCOUNTER — Telehealth: Payer: Self-pay | Admitting: *Deleted

## 2014-08-03 NOTE — Telephone Encounter (Signed)
Pt is aware of his lab results. 

## 2014-08-03 NOTE — Telephone Encounter (Signed)
Message copied by Joan Mayans on Tue Aug 03, 2014  9:35 AM ------      Message from: Tresa Garter      Created: Tue Aug 03, 2014  9:01 AM       Please inform patient that his laboratory tests results show some increasing level of cholesterol, we encouraged patient to continue his Pravachol as prescribed compliant with regular physical exercise and low-cholesterol low-fat diet. There is a little evidence of diabetic take effect on the kidney, and his advise patient on better glucose control, drink plenty of water and repeat these tests in 3 months to monitor her kidney function ------

## 2014-09-27 ENCOUNTER — Telehealth: Payer: Self-pay | Admitting: Internal Medicine

## 2014-09-29 ENCOUNTER — Ambulatory Visit: Payer: No Typology Code available for payment source

## 2014-10-04 ENCOUNTER — Encounter: Payer: Self-pay | Admitting: Internal Medicine

## 2014-10-04 ENCOUNTER — Ambulatory Visit: Payer: No Typology Code available for payment source | Attending: Internal Medicine | Admitting: Internal Medicine

## 2014-10-04 VITALS — BP 138/93 | HR 64 | Temp 98.0°F | Resp 16 | Ht 72.0 in | Wt 265.0 lb

## 2014-10-04 DIAGNOSIS — I10 Essential (primary) hypertension: Secondary | ICD-10-CM | POA: Insufficient documentation

## 2014-10-04 DIAGNOSIS — E119 Type 2 diabetes mellitus without complications: Secondary | ICD-10-CM | POA: Insufficient documentation

## 2014-10-04 DIAGNOSIS — M25569 Pain in unspecified knee: Secondary | ICD-10-CM | POA: Insufficient documentation

## 2014-10-04 DIAGNOSIS — E780 Pure hypercholesterolemia: Secondary | ICD-10-CM | POA: Insufficient documentation

## 2014-10-04 DIAGNOSIS — Z79899 Other long term (current) drug therapy: Secondary | ICD-10-CM | POA: Insufficient documentation

## 2014-10-04 DIAGNOSIS — M25562 Pain in left knee: Secondary | ICD-10-CM | POA: Insufficient documentation

## 2014-10-04 LAB — GLUCOSE, POCT (MANUAL RESULT ENTRY): POC Glucose: 216 mg/dl — AB (ref 70–99)

## 2014-10-04 MED ORDER — ACETAMINOPHEN-CODEINE #3 300-30 MG PO TABS: 1.0000 | ORAL_TABLET | ORAL | Status: DC | PRN

## 2014-10-04 NOTE — Progress Notes (Signed)
Patient ID: Kyle Merritt, male   DOB: February 11, 1961, 53 y.o.   MRN: [id number]   Kyle Merritt, is a 53 y.o. male  [id number]  [id number]  DOB - [birth year]  Chief Complaint  Patient presents with  . Follow-up        Subjective:   Kyle Merritt is a 53 y.o. male here today for a follow up visit. Patient with history of hypertension, diabetes mellitus and hypercholesterolemia here today for routine follow-up. Patient is complaining of left knee pain for over 2 months, no swelling, no redness, no history of trauma, no history of fall. Patient claims compliant with medication for hypertension and diabetes. He reports no side effects. No hypoglycemic episodes. Patient has No headache, No chest pain, No abdominal pain - No Nausea, No new weakness tingling or numbness, No Cough - SOB.  Problem  Knee Pain, Acute    ALLERGIES: No Known Allergies  PAST MEDICAL HISTORY: Past Medical History  Diagnosis Date  . Hypertension   . Diabetes mellitus without complication   . High cholesterol   . Colon cancer     MEDICATIONS AT HOME: Prior to Admission medications   Medication Sig Start Date End Date Taking? Authorizing Provider  acetaminophen-codeine (TYLENOL #3) 300-30 MG per tablet Take 1 tablet by mouth every 4 (four) hours as needed. 10/04/14  Yes Tresa Garter, MD  diclofenac sodium (VOLTAREN) 1 % GEL Apply 2 g topically 4 (four) times daily. 08/02/14  Yes Tresa Garter, MD  lisinopril-hydrochlorothiazide (PRINZIDE,ZESTORETIC) 20-12.5 MG per tablet Take 1 tablet by mouth daily. 04/29/14  Yes Tresa Garter, MD  pravastatin (PRAVACHOL) 40 MG tablet Take 1 tablet (40 mg total) by mouth daily. 04/29/14  Yes Tresa Garter, MD  sitaGLIPtin-metformin (JANUMET) 50-1000 MG per tablet Take 1 tablet by mouth daily. 04/29/14  Yes Tresa Garter, MD  sildenafil (VIAGRA) 100 MG tablet Take 0.5-1 tablets (50-100 mg total) by mouth daily as needed for erectile dysfunction. 04/29/14    Tresa Garter, MD     Objective:   Filed Vitals:   10/04/14 1659  BP: 138/93  Pulse: 64  Temp: 98 F (36.7 C)  TempSrc: Oral  Resp: 16  Height: 6' (1.829 m)  Weight: 265 lb (120.203 kg)  SpO2: 100%    Exam General appearance : Awake, alert, not in any distress. Speech Clear. Not toxic looking HEENT: Atraumatic and Normocephalic, pupils equally reactive to light and accomodation Neck: supple, no JVD. No cervical lymphadenopathy.  Chest:Good air entry bilaterally, no added sounds  CVS: S1 S2 regular, no murmurs.  Abdomen: Bowel sounds present, Non tender and not distended with no gaurding, rigidity or rebound. Extremities: B/L Lower Ext shows no edema, both legs are warm to touch Neurology: Awake alert, and oriented X 3, CN II-XII intact, Non focal  Data Review Lab Results  Component Value Date   HGBA1C 7.0 08/02/2014   HGBA1C 8.1 04/29/2014   HGBA1C 10.1* 12/14/2013     Assessment & Plan   1. Type 2 diabetes mellitus without complication  - Glucose (CBG)   Aim for 2-3 Carb Choices per meal (30-45 grams) +/- 1 either way  Aim for 0-15 Carbs per snack if hungry  Include protein in moderation with your meals and snacks  Consider reading food labels for Total Carbohydrate and Fat Grams of foods  Consider checking BG at alternate times per day  Continue taking medication as directed Fruit Punch - find one with no sugar  Measure and  decrease portions of carbohydrate foods  Make your plate and don't go back for seconds   2. Knee pain, acute, left  - acetaminophen-codeine (TYLENOL #3) 300-30 MG per tablet; Take 1 tablet by mouth every 4 (four) hours as needed.  Dispense: 60 tablet; Refill: 0 - DG Knee Complete 4 Views Left; Future   Return in about 4 weeks (around 11/01/2014), or if symptoms worsen or fail to improve, for Hemoglobin A1C and Follow up, DM, Follow up HTN, Follow up Pain and comorbidities.  The patient was given clear instructions to go to ER or  return to medical center if symptoms don't improve, worsen or new problems develop. The patient verbalized understanding. The patient was told to call to get lab results if they haven't Merritt anything in the next week.   This note has been created with Surveyor, quantity. Any transcriptional errors are unintentional.    Angelica Chessman, MD, [place], [place], [place] and [place], [place]   10/04/2014, 5:26 PM

## 2014-10-04 NOTE — Progress Notes (Signed)
Pt is here following up on his HTN and diabetes. Pt is c/o left knee pain that been sore for over 2 months.

## 2014-10-04 NOTE — Patient Instructions (Signed)
Diabetes Mellitus and Food It is important for you to manage your blood sugar (glucose) level. Your blood glucose level can be greatly affected by what you eat. Eating healthier foods in the appropriate amounts throughout the day at about the same time each day will help you control your blood glucose level. It can also help slow or prevent worsening of your diabetes mellitus. Healthy eating may even help you improve the level of your blood pressure and reach or maintain a healthy weight.  HOW CAN FOOD AFFECT ME? Carbohydrates Carbohydrates affect your blood glucose level more than any other type of food. Your dietitian will help you determine how many carbohydrates to eat at each meal and teach you how to count carbohydrates. Counting carbohydrates is important to keep your blood glucose at a healthy level, especially if you are using insulin or taking certain medicines for diabetes mellitus. Alcohol Alcohol can cause sudden decreases in blood glucose (hypoglycemia), especially if you use insulin or take certain medicines for diabetes mellitus. Hypoglycemia can be a life-threatening condition. Symptoms of hypoglycemia (sleepiness, dizziness, and disorientation) are similar to symptoms of having too much alcohol.  If your health care provider has given you approval to drink alcohol, do so in moderation and use the following guidelines:  Women should not have more than one drink per day, and men should not have more than two drinks per day. One drink is equal to:  12 oz of beer.  5 oz of wine.  1 oz of hard liquor.  Do not drink on an empty stomach.  Keep yourself hydrated. Have water, diet soda, or unsweetened iced tea.  Regular soda, juice, and other mixers might contain a lot of carbohydrates and should be counted. WHAT FOODS ARE NOT RECOMMENDED? As you make food choices, it is important to remember that all foods are not the same. Some foods have fewer nutrients per serving than other  foods, even though they might have the same number of calories or carbohydrates. It is difficult to get your body what it needs when you eat foods with fewer nutrients. Examples of foods that you should avoid that are high in calories and carbohydrates but low in nutrients include:  Trans fats (most processed foods list trans fats on the Nutrition Facts label).  Regular soda.  Juice.  Candy.  Sweets, such as cake, pie, doughnuts, and cookies.  Fried foods. WHAT FOODS CAN I EAT? Have nutrient-rich foods, which will nourish your body and keep you healthy. The food you should eat also will depend on several factors, including:  The calories you need.  The medicines you take.  Your weight.  Your blood glucose level.  Your blood pressure level.  Your cholesterol level. You also should eat a variety of foods, including:  Protein, such as meat, poultry, fish, tofu, nuts, and seeds (lean animal proteins are best).  Fruits.  Vegetables.  Dairy products, such as milk, cheese, and yogurt (low fat is best).  Breads, grains, pasta, cereal, rice, and beans.  Fats such as olive oil, trans fat-free margarine, canola oil, avocado, and olives. DOES EVERYONE WITH DIABETES MELLITUS HAVE THE SAME MEAL PLAN? Because every person with diabetes mellitus is different, there is not one meal plan that works for everyone. It is very important that you meet with a dietitian who will help you create a meal plan that is just right for you. Document Released: 09/06/2005 Document Revised: 12/15/2013 Document Reviewed: 11/06/2013 [employer] Patient Information 2015 [employer]. This   information is not intended to replace advice given to you by your health care provider. Make sure you discuss any questions you have with your health care provider. DASH Eating Plan DASH stands for "Dietary Approaches to Stop Hypertension." The DASH eating plan is a healthy eating plan that has been shown to reduce high  blood pressure (hypertension). Additional health benefits may include reducing the risk of type 2 diabetes mellitus, heart disease, and stroke. The DASH eating plan may also help with weight loss. WHAT DO I NEED TO KNOW ABOUT THE DASH EATING PLAN? For the DASH eating plan, you will follow these general guidelines:  Choose foods with a percent daily value for sodium of less than 5% (as listed on the food label).  Use salt-free seasonings or herbs instead of table salt or sea salt.  Check with your health care provider or pharmacist before using salt substitutes.  Eat lower-sodium products, often labeled as "lower sodium" or "no salt added."  Eat fresh foods.  Eat more vegetables, fruits, and low-fat dairy products.  Choose whole grains. Look for the word "whole" as the first word in the ingredient list.  Choose fish and skinless chicken or turkey more often than red meat. Limit fish, poultry, and meat to 6 oz (170 g) each day.  Limit sweets, desserts, sugars, and sugary drinks.  Choose heart-healthy fats.  Limit cheese to 1 oz (28 g) per day.  Eat more home-cooked food and less restaurant, buffet, and fast food.  Limit fried foods.  Cook foods using methods other than frying.  Limit canned vegetables. If you do use them, rinse them well to decrease the sodium.  When eating at a restaurant, ask that your food be prepared with less salt, or no salt if possible. WHAT FOODS CAN I EAT? Seek help from a dietitian for individual calorie needs. Grains Whole grain or whole wheat bread. Brown rice. Whole grain or whole wheat pasta. Quinoa, bulgur, and whole grain cereals. Low-sodium cereals. Corn or whole wheat flour tortillas. Whole grain cornbread. Whole grain crackers. Low-sodium crackers. Vegetables Fresh or frozen vegetables (raw, steamed, roasted, or grilled). Low-sodium or reduced-sodium tomato and vegetable juices. Low-sodium or reduced-sodium tomato sauce and paste. Low-sodium  or reduced-sodium canned vegetables.  Fruits All fresh, canned (in natural juice), or frozen fruits. Meat and Other Protein Products Ground beef (85% or leaner), grass-fed beef, or beef trimmed of fat. Skinless chicken or turkey. Ground chicken or turkey. Pork trimmed of fat. All fish and seafood. Eggs. Dried beans, peas, or lentils. Unsalted nuts and seeds. Unsalted canned beans. Dairy Low-fat dairy products, such as skim or 1% milk, 2% or reduced-fat cheeses, low-fat ricotta or cottage cheese, or plain low-fat yogurt. Low-sodium or reduced-sodium cheeses. Fats and Oils Tub margarines without trans fats. Light or reduced-fat mayonnaise and salad dressings (reduced sodium). Avocado. Safflower, olive, or canola oils. Natural peanut or almond butter. Other Unsalted popcorn and pretzels. The items listed above may not be a complete list of recommended foods or beverages. Contact your dietitian for more options. WHAT FOODS ARE NOT RECOMMENDED? Grains White bread. White pasta. White rice. Refined cornbread. Bagels and croissants. Crackers that contain trans fat. Vegetables Creamed or fried vegetables. Vegetables in a cheese sauce. Regular canned vegetables. Regular canned tomato sauce and paste. Regular tomato and vegetable juices. Fruits Dried fruits. Canned fruit in light or heavy syrup. Fruit juice. Meat and Other Protein Products Fatty cuts of meat. Ribs, chicken wings, bacon, sausage, bologna, salami, chitterlings, fatback, hot   dogs, bratwurst, and packaged luncheon meats. Salted nuts and seeds. Canned beans with salt. Dairy Whole or 2% milk, cream, half-and-half, and cream cheese. Whole-fat or sweetened yogurt. Full-fat cheeses or blue cheese. Nondairy creamers and whipped toppings. Processed cheese, cheese spreads, or cheese curds. Condiments Onion and garlic salt, seasoned salt, table salt, and sea salt. Canned and packaged gravies. Worcestershire sauce. Tartar sauce. Barbecue sauce.  Teriyaki sauce. Soy sauce, including reduced sodium. Steak sauce. Fish sauce. Oyster sauce. Cocktail sauce. Horseradish. Ketchup and mustard. Meat flavorings and tenderizers. Bouillon cubes. Hot sauce. Tabasco sauce. Marinades. Taco seasonings. Relishes. Fats and Oils Butter, stick margarine, lard, shortening, ghee, and bacon fat. Coconut, palm kernel, or palm oils. Regular salad dressings. Other Pickles and olives. Salted popcorn and pretzels. The items listed above may not be a complete list of foods and beverages to avoid. Contact your dietitian for more information. WHERE CAN I FIND MORE INFORMATION? National Heart, Lung, and Blood Institute: travelstabloid.com Document Released: 11/29/2011 Document Revised: 04/26/2014 Document Reviewed: 10/14/2013 [facility] Patient Information 2015 [place], [place]. This information is not intended to replace advice given to you by your health care provider. Make sure you discuss any questions you have with your health care provider. Hypertension Hypertension, commonly called high blood pressure, is when the force of blood pumping through your arteries is too strong. Your arteries are the blood vessels that carry blood from your heart throughout your body. A blood pressure reading consists of a higher number over a lower number, such as 110/72. The higher number (systolic) is the pressure inside your arteries when your heart pumps. The lower number (diastolic) is the pressure inside your arteries when your heart relaxes. Ideally you want your blood pressure below 120/80. Hypertension forces your heart to work harder to pump blood. Your arteries may become narrow or stiff. Having hypertension puts you at risk for heart disease, stroke, and other problems.  RISK FACTORS Some risk factors for high blood pressure are controllable. Others are not.  Risk factors you cannot control include:   Race. You may be at higher risk if you are  African American.  Age. Risk increases with age.  Gender. Men are at higher risk than women before age 30 years. After age 16, women are at higher risk than men. Risk factors you can control include:  Not getting enough exercise or physical activity.  Being overweight.  Getting too much fat, sugar, calories, or salt in your diet.  Drinking too much alcohol. SIGNS AND SYMPTOMS Hypertension does not usually cause signs or symptoms. Extremely high blood pressure (hypertensive crisis) may cause headache, anxiety, shortness of breath, and nosebleed. DIAGNOSIS  To check if you have hypertension, your health care provider will measure your blood pressure while you are seated, with your arm held at the level of your heart. It should be measured at least twice using the same arm. Certain conditions can cause a difference in blood pressure between your right and left arms. A blood pressure reading that is higher than normal on one occasion does not mean that you need treatment. If one blood pressure reading is high, ask your health care provider about having it checked again. TREATMENT  Treating high blood pressure includes making lifestyle changes and possibly taking medicine. Living a healthy lifestyle can help lower high blood pressure. You may need to change some of your habits. Lifestyle changes may include:  Following the DASH diet. This diet is high in fruits, vegetables, and whole grains. It is low  in salt, red meat, and added sugars.  Getting at least 2 hours of brisk physical activity every week.  Losing weight if necessary.  Not smoking.  Limiting alcoholic beverages.  Learning ways to reduce stress. If lifestyle changes are not enough to get your blood pressure under control, your health care provider may prescribe medicine. You may need to take more than one. Work closely with your health care provider to understand the risks and benefits. HOME CARE INSTRUCTIONS  Have your  blood pressure rechecked as directed by your health care provider.   Take medicines only as directed by your health care provider. Follow the directions carefully. Blood pressure medicines must be taken as prescribed. The medicine does not work as well when you skip doses. Skipping doses also puts you at risk for problems.   Do not smoke.   Monitor your blood pressure at home as directed by your health care provider. SEEK MEDICAL CARE IF:   You think you are having a reaction to medicines taken.  You have recurrent headaches or feel dizzy.  You have swelling in your ankles.  You have trouble with your vision. SEEK IMMEDIATE MEDICAL CARE IF:  You develop a severe headache or confusion.  You have unusual weakness, numbness, or feel faint.  You have severe chest or abdominal pain.  You vomit repeatedly.  You have trouble breathing. MAKE SURE YOU:   Understand these instructions.  Will watch your condition.  Will get help right away if you are not doing well or get worse. Document Released: 12/10/2005 Document Revised: 04/26/2014 Document Reviewed: 10/02/2013 [facility] Patient Information 2015 [place], [place]. This information is not intended to replace advice given to you by your health care provider. Make sure you discuss any questions you have with your health care provider. Knee Pain The knee is the complex joint between your thigh and your lower leg. It is made up of bones, tendons, ligaments, and cartilage. The bones that make up the knee are:  The femur in the thigh.  The tibia and fibula in the lower leg.  The patella or kneecap riding in the groove on the lower femur. CAUSES  Knee pain is a common complaint with many causes. A few of these causes are:  Injury, such as:  A ruptured ligament or tendon injury.  Torn cartilage.  Medical conditions, such as:  Gout  Arthritis  Infections  Overuse, over training, or overdoing a physical activity. Knee  pain can be minor or severe. Knee pain can accompany debilitating injury. Minor knee problems often respond well to self-care measures or get well on their own. More serious injuries may need medical intervention or even surgery. SYMPTOMS The knee is complex. Symptoms of knee problems can vary widely. Some of the problems are:  Pain with movement and weight bearing.  Swelling and tenderness.  Buckling of the knee.  Inability to straighten or extend your knee.  Your knee locks and you cannot straighten it.  Warmth and redness with pain and fever.  Deformity or dislocation of the kneecap. DIAGNOSIS  Determining what is wrong may be very straight forward such as when there is an injury. It can also be challenging because of the complexity of the knee. Tests to make a diagnosis may include:  Your caregiver taking a history and doing a physical exam.  Routine X-rays can be used to rule out other problems. X-rays will not reveal a cartilage tear. Some injuries of the knee can be diagnosed by:  Arthroscopy a surgical technique by which a small video camera is inserted through tiny incisions on the sides of the knee. This procedure is used to examine and repair internal knee joint problems. Tiny instruments can be used during arthroscopy to repair the torn knee cartilage (meniscus).  Arthrography is a radiology technique. A contrast liquid is directly injected into the knee joint. Internal structures of the knee joint then become visible on X-ray film.  An MRI scan is a non X-ray radiology procedure in which magnetic fields and a computer produce two- or three-dimensional images of the inside of the knee. Cartilage tears are often visible using an MRI scanner. MRI scans have largely replaced arthrography in diagnosing cartilage tears of the knee.  Blood work.  Examination of the fluid that helps to lubricate the knee joint (synovial fluid). This is done by taking a sample out using a needle  and a syringe. TREATMENT The treatment of knee problems depends on the cause. Some of these treatments are:  Depending on the injury, proper casting, splinting, surgery, or physical therapy care will be needed.  Give yourself adequate recovery time. Do not overuse your joints. If you begin to get sore during workout routines, back off. Slow down or do fewer repetitions.  For repetitive activities such as cycling or running, maintain your strength and nutrition.  Alternate muscle groups. For example, if you are a weight lifter, work the upper body on one day and the lower body the next.  Either tight or weak muscles do not give the proper support for your knee. Tight or weak muscles do not absorb the stress placed on the knee joint. Keep the muscles surrounding the knee strong.  Take care of mechanical problems.  If you have flat feet, orthotics or special shoes may help. See your caregiver if you need help.  Arch supports, sometimes with wedges on the inner or outer aspect of the heel, can help. These can shift pressure away from the side of the knee most bothered by osteoarthritis.  A brace called an "unloader" brace also may be used to help ease the pressure on the most arthritic side of the knee.  If your caregiver has prescribed crutches, braces, wraps or ice, use as directed. The acronym for this is PRICE. This means protection, rest, ice, compression, and elevation.  Nonsteroidal anti-inflammatory drugs (NSAIDs), can help relieve pain. But if taken immediately after an injury, they may actually increase swelling. Take NSAIDs with food in your stomach. Stop them if you develop stomach problems. Do not take these if you have a history of ulcers, stomach pain, or bleeding from the bowel. Do not take without your caregiver's approval if you have problems with fluid retention, heart failure, or kidney problems.  For ongoing knee problems, physical therapy may be helpful.  Glucosamine and  chondroitin are over-the-counter dietary supplements. Both may help relieve the pain of osteoarthritis in the knee. These medicines are different from the usual anti-inflammatory drugs. Glucosamine may decrease the rate of cartilage destruction.  Injections of a corticosteroid drug into your knee joint may help reduce the symptoms of an arthritis flare-up. They may provide pain relief that lasts a few months. You may have to wait a few months between injections. The injections do have a small increased risk of infection, water retention, and elevated blood sugar levels.  Hyaluronic acid injected into damaged joints may ease pain and provide lubrication. These injections may work by reducing inflammation. A series of shots may  give relief for as long as 6 months.  Topical painkillers. Applying certain ointments to your skin may help relieve the pain and stiffness of osteoarthritis. Ask your pharmacist for suggestions. Many over the-counter products are approved for temporary relief of arthritis pain.  In some countries, doctors often prescribe topical NSAIDs for relief of chronic conditions such as arthritis and tendinitis. A review of treatment with NSAID creams found that they worked as well as oral medications but without the serious side effects. PREVENTION  Maintain a healthy weight. Extra pounds put more strain on your joints.  Get strong, stay limber. Weak muscles are a common cause of knee injuries. Stretching is important. Include flexibility exercises in your workouts.  Be smart about exercise. If you have osteoarthritis, chronic knee pain or recurring injuries, you may need to change the way you exercise. This does not mean you have to stop being active. If your knees ache after jogging or playing basketball, consider switching to swimming, water aerobics, or other low-impact activities, at least for a few days a week. Sometimes limiting high-impact activities will provide relief.  Make  sure your shoes fit well. Choose footwear that is right for your sport.  Protect your knees. Use the proper gear for knee-sensitive activities. Use kneepads when playing volleyball or laying carpet. Buckle your seat belt every time you drive. Most shattered kneecaps occur in car accidents.  Rest when you are tired. SEEK MEDICAL CARE IF:  You have knee pain that is continual and does not seem to be getting better.  SEEK IMMEDIATE MEDICAL CARE IF:  Your knee joint feels hot to the touch and you have a high fever. MAKE SURE YOU:   Understand these instructions.  Will watch your condition.  Will get help right away if you are not doing well or get worse. Document Released: 10/07/2007 Document Revised: 03/03/2012 Document Reviewed: 10/07/2007 [facility] Patient Information 2015 [place], [place]. This information is not intended to replace advice given to you by your health care provider. Make sure you discuss any questions you have with your health care provider.

## 2014-10-05 ENCOUNTER — Ambulatory Visit: Payer: No Typology Code available for payment source

## 2014-10-11 ENCOUNTER — Ambulatory Visit (HOSPITAL_COMMUNITY)
Admission: RE | Admit: 2014-10-11 | Discharge: 2014-10-11 | Disposition: A | Discharge status: Home / Self Care | Payer: No Typology Code available for payment source | Source: Ambulatory Visit | Attending: Internal Medicine | Admitting: Internal Medicine

## 2014-10-11 DIAGNOSIS — M179 Osteoarthritis of knee, unspecified: Secondary | ICD-10-CM | POA: Insufficient documentation

## 2014-10-11 DIAGNOSIS — M25562 Pain in left knee: Secondary | ICD-10-CM | POA: Insufficient documentation

## 2014-10-12 ENCOUNTER — Other Ambulatory Visit: Payer: Self-pay | Admitting: Internal Medicine

## 2014-10-12 DIAGNOSIS — N528 Other male erectile dysfunction: Secondary | ICD-10-CM

## 2014-10-12 MED ORDER — SILDENAFIL CITRATE 100 MG PO TABS: 50.0000 mg | ORAL_TABLET | Freq: Every day | ORAL | Status: DC | PRN

## 2014-10-13 ENCOUNTER — Telehealth: Payer: Self-pay | Admitting: Internal Medicine

## 2014-10-13 ENCOUNTER — Telehealth: Payer: Self-pay | Admitting: Emergency Medicine

## 2014-10-13 NOTE — Telephone Encounter (Signed)
Patient states he just spoke to nurse. Patient returning call to see if he could speak to nurse again in regards to recommendations for pain medications that he could take for his osteoarthritis in knee. Patient states the medications that he has been taking is not relieving his pain. Please assist.

## 2014-10-13 NOTE — Telephone Encounter (Signed)
Pt given xray results with instructions to monitor for changes in increased swelling,redness or fevers. Pt states he is still experiencing pain with swelling unrelieved by prescribed Tylenol #3. Pt given walk in hrs

## 2014-10-13 NOTE — Telephone Encounter (Signed)
Message copied by Ricci Barker on Wed Oct 13, 2014  1:56 PM ------      Message from: Tresa Garter      Created: Tue Oct 12, 2014 10:40 AM       Please inform patient that his knee x-ray shows osteoarthritis with moderate joint effusion. No fracture or dislocation. Please continue pain medication, report to the clinic if swelling increases, or there is redness or if patient develops fever or intractable pain. ------

## 2014-10-18 ENCOUNTER — Ambulatory Visit: Payer: No Typology Code available for payment source | Attending: Internal Medicine

## 2014-10-29 ENCOUNTER — Telehealth: Payer: Self-pay | Admitting: Internal Medicine

## 2014-10-29 NOTE — Telephone Encounter (Signed)
Patient call stating that he will be leaving town Tuesday and is requesting a medication refill for all of his medications prior to him leaving. Please assist.

## 2014-10-29 NOTE — Telephone Encounter (Signed)
**  Duplicate note, first one not routed properly** Patient call stating that he will be leaving town Tuesday and is requesting a medication refill for all of his medications prior to him leaving. Please assist.

## 2014-11-04 ENCOUNTER — Ambulatory Visit: Payer: Self-pay | Admitting: Internal Medicine

## 2014-11-05 ENCOUNTER — Telehealth: Payer: Self-pay | Admitting: Emergency Medicine

## 2014-11-05 DIAGNOSIS — I10 Essential (primary) hypertension: Secondary | ICD-10-CM

## 2014-11-05 DIAGNOSIS — E785 Hyperlipidemia, unspecified: Secondary | ICD-10-CM

## 2014-11-05 DIAGNOSIS — E119 Type 2 diabetes mellitus without complications: Secondary | ICD-10-CM

## 2014-11-05 MED ORDER — PRAVASTATIN SODIUM 40 MG PO TABS: 40.0000 mg | ORAL_TABLET | Freq: Every day | ORAL | Status: DC

## 2014-11-05 MED ORDER — LISINOPRIL-HYDROCHLOROTHIAZIDE 20-12.5 MG PO TABS: 1.0000 | ORAL_TABLET | Freq: Every day | ORAL | Status: DC

## 2014-11-05 MED ORDER — SITAGLIPTIN PHOS-METFORMIN HCL 50-1000 MG PO TABS: 1.0000 | ORAL_TABLET | Freq: Every day | ORAL | Status: DC

## 2014-11-05 NOTE — Telephone Encounter (Signed)
Attempted to reach pt in regards to medication refills; line busy

## 2014-11-29 ENCOUNTER — Encounter: Payer: Self-pay | Admitting: Internal Medicine

## 2014-11-29 ENCOUNTER — Ambulatory Visit: Payer: Self-pay | Attending: Internal Medicine | Admitting: Internal Medicine

## 2014-11-29 VITALS — BP 148/97 | HR 81 | Temp 97.9°F | Resp 16 | Ht 75.0 in | Wt 271.0 lb

## 2014-11-29 DIAGNOSIS — Z79899 Other long term (current) drug therapy: Secondary | ICD-10-CM | POA: Insufficient documentation

## 2014-11-29 DIAGNOSIS — Z1211 Encounter for screening for malignant neoplasm of colon: Secondary | ICD-10-CM

## 2014-11-29 DIAGNOSIS — I10 Essential (primary) hypertension: Secondary | ICD-10-CM | POA: Insufficient documentation

## 2014-11-29 DIAGNOSIS — M25562 Pain in left knee: Secondary | ICD-10-CM | POA: Insufficient documentation

## 2014-11-29 DIAGNOSIS — E119 Type 2 diabetes mellitus without complications: Secondary | ICD-10-CM | POA: Insufficient documentation

## 2014-11-29 LAB — GLUCOSE, POCT (MANUAL RESULT ENTRY): POC Glucose: 115 mg/dl — AB (ref 70–99)

## 2014-11-29 LAB — POCT GLYCOSYLATED HEMOGLOBIN (HGB A1C): Hemoglobin A1C: 8.6

## 2014-11-29 NOTE — Progress Notes (Signed)
Patient ID: Kyle Merritt, male   DOB: 1961/04/02, 53 y.o.   MRN: [id number]   Kyle Merritt, is a 53 y.o. male  [id number]:[id number]  [id number]  DOB - 1961-08-05  Chief Complaint  Patient presents with  . Follow-up  . Hypertension  . Diabetes        Subjective:   Kyle Merritt is a 53 y.o. male here today for a follow up visit. Patient has history of hypertension, diabetes mellitus without complication, and high cholesterol here today for routine follow-up. Patient claims compliant with medications, he is on Janumet for diabetes, he complains of left knee pain that is relieved by Tylenol No. 3. He is now taking over-the-counter medications for pain with some relief. He denies numbness or tingling, no dizziness or blurry vision. Patient has No headache, No chest pain, No abdominal pain - No Nausea, No new weakness tingling or numbness, No Cough - SOB.  Problem  Colon Cancer Screening    ALLERGIES: No Known Allergies  PAST MEDICAL HISTORY: Past Medical History  Diagnosis Date  . Hypertension   . Diabetes mellitus without complication   . High cholesterol   . Colon cancer     MEDICATIONS AT HOME: Prior to Admission medications   Medication Sig Start Date End Date Taking? Authorizing Provider  lisinopril-hydrochlorothiazide (PRINZIDE,ZESTORETIC) 20-12.5 MG per tablet Take 1 tablet by mouth daily. 11/05/14  Yes Tresa Garter, MD  pravastatin (PRAVACHOL) 40 MG tablet Take 1 tablet (40 mg total) by mouth daily. 11/05/14  Yes Tresa Garter, MD  sildenafil (VIAGRA) 100 MG tablet Take 0.5-1 tablets (50-100 mg total) by mouth daily as needed for erectile dysfunction. 10/12/14  Yes Tresa Garter, MD  sitaGLIPtin-metformin (JANUMET) 50-1000 MG per tablet Take 1 tablet by mouth daily. 11/05/14  Yes Tresa Garter, MD  acetaminophen-codeine (TYLENOL #3) 300-30 MG per tablet Take 1 tablet by mouth every 4 (four) hours as needed. Patient not taking: Reported on 11/29/2014  10/04/14   Tresa Garter, MD  diclofenac sodium (VOLTAREN) 1 % GEL Apply 2 g topically 4 (four) times daily. Patient not taking: Reported on 11/29/2014 08/02/14   Tresa Garter, MD     Objective:   Filed Vitals:   11/29/14 1212  BP: 148/97  Pulse: 81  Temp: 97.9 F (36.6 C)  TempSrc: Oral  Resp: 16  Height: 6\' 3"  (1.905 m)  Weight: 271 lb (122.925 kg)  SpO2: 100%    Exam General appearance : Awake, alert, not in any distress. Speech Clear. Not toxic looking HEENT: Atraumatic and Normocephalic, pupils equally reactive to light and accomodation Neck: supple, no JVD. No cervical lymphadenopathy.  Chest:Good air entry bilaterally, no added sounds  CVS: S1 S2 regular, no murmurs.  Abdomen: Bowel sounds present, Non tender and not distended with no gaurding, rigidity or rebound. Extremities: B/L Lower Ext shows no edema, both legs are warm to touch Neurology: Awake alert, and oriented X 3, CN II-XII intact, Non focal  Data Review Lab Results  Component Value Date   HGBA1C 8.6 11/29/2014   HGBA1C 7.0 08/02/2014   HGBA1C 8.1 04/29/2014     Assessment & Plan   1. Type 2 diabetes mellitus without complication  - HgB P8K has gone up to 8.6% from 7.0% last visit - Glucose (CBG) It took a while for patient to get his medication refill, will continue the same dose for now and monitor hemoglobin A1c again in 3 months, if hemoglobin A1c remained the same or goes  up, We may need to increase the dose or add medications.  - Ambulatory referral to Ophthalmology   Aim for 2-3 Carb Choices per meal (30-45 grams) +/- 1 either way  Aim for 0-15 Carbs per snack if hungry  Include protein in moderation with your meals and snacks  Consider reading food labels for Total Carbohydrate and Fat Grams of foods  Consider checking BG at alternate times per day  Continue taking medication as directed Fruit Punch - find one with no sugar  Measure and decrease portions of carbohydrate  foods  Make your plate and don't go back for seconds   2. Colon cancer screening  - [facility] COLONOSCOPY - Ambulatory referral to Gastroenterology   Return in about 3 months (around 02/28/2015), or if symptoms worsen or fail to improve, for Hemoglobin A1C and Follow up, DM, Follow up HTN.  The patient was given clear instructions to go to ER or return to medical center if symptoms don't improve, worsen or new problems develop. The patient verbalized understanding. The patient was told to call to get lab results if they haven't heard anything in the next week.   This note has been created with Surveyor, quantity. Any transcriptional errors are unintentional.    Angelica Chessman, MD, [place], [place], [place] [facility] [place], [place]   11/29/2014, 12:44 PM

## 2014-11-29 NOTE — Patient Instructions (Signed)
DASH Eating Plan DASH stands for "Dietary Approaches to Stop Hypertension." The DASH eating plan is a healthy eating plan that has been shown to reduce high blood pressure (hypertension). Additional health benefits may include reducing the risk of type 2 diabetes mellitus, heart disease, and stroke. The DASH eating plan may also help with weight loss. WHAT DO I NEED TO KNOW ABOUT THE DASH EATING PLAN? For the DASH eating plan, you will follow these general guidelines:  Choose foods with a percent daily value for sodium of less than 5% (as listed on the food label).  Use salt-free seasonings or herbs instead of table salt or sea salt.  Check with your health care provider or pharmacist before using salt substitutes.  Eat lower-sodium products, often labeled as "lower sodium" or "no salt added."  Eat fresh foods.  Eat more vegetables, fruits, and low-fat dairy products.  Choose whole grains. Look for the word "whole" as the first word in the ingredient list.  Choose fish and skinless chicken or turkey more often than red meat. Limit fish, poultry, and meat to 6 oz (170 g) each day.  Limit sweets, desserts, sugars, and sugary drinks.  Choose heart-healthy fats.  Limit cheese to 1 oz (28 g) per day.  Eat more home-cooked food and less restaurant, buffet, and fast food.  Limit fried foods.  Cook foods using methods other than frying.  Limit canned vegetables. If you do use them, rinse them well to decrease the sodium.  When eating at a restaurant, ask that your food be prepared with less salt, or no salt if possible. WHAT FOODS CAN I EAT? Seek help from a dietitian for individual calorie needs. Grains Whole grain or whole wheat bread. Brown rice. Whole grain or whole wheat pasta. Quinoa, bulgur, and whole grain cereals. Low-sodium cereals. Corn or whole wheat flour tortillas. Whole grain cornbread. Whole grain crackers. Low-sodium crackers. Vegetables Fresh or frozen vegetables  (raw, steamed, roasted, or grilled). Low-sodium or reduced-sodium tomato and vegetable juices. Low-sodium or reduced-sodium tomato sauce and paste. Low-sodium or reduced-sodium canned vegetables.  Fruits All fresh, canned (in natural juice), or frozen fruits. Meat and Other Protein Products Ground beef (85% or leaner), grass-fed beef, or beef trimmed of fat. Skinless chicken or turkey. Ground chicken or turkey. Pork trimmed of fat. All fish and seafood. Eggs. Dried beans, peas, or lentils. Unsalted nuts and seeds. Unsalted canned beans. Dairy Low-fat dairy products, such as skim or 1% milk, 2% or reduced-fat cheeses, low-fat ricotta or cottage cheese, or plain low-fat yogurt. Low-sodium or reduced-sodium cheeses. Fats and Oils Tub margarines without trans fats. Light or reduced-fat mayonnaise and salad dressings (reduced sodium). Avocado. Safflower, olive, or canola oils. Natural peanut or almond butter. Other Unsalted popcorn and pretzels. The items listed above may not be a complete list of recommended foods or beverages. Contact your dietitian for more options. WHAT FOODS ARE NOT RECOMMENDED? Grains White bread. White pasta. White rice. Refined cornbread. Bagels and croissants. Crackers that contain trans fat. Vegetables Creamed or fried vegetables. Vegetables in a cheese sauce. Regular canned vegetables. Regular canned tomato sauce and paste. Regular tomato and vegetable juices. Fruits Dried fruits. Canned fruit in light or heavy syrup. Fruit juice. Meat and Other Protein Products Fatty cuts of meat. Ribs, chicken wings, bacon, sausage, bologna, salami, chitterlings, fatback, hot dogs, bratwurst, and packaged luncheon meats. Salted nuts and seeds. Canned beans with salt. Dairy Whole or 2% milk, cream, half-and-half, and cream cheese. Whole-fat or sweetened yogurt. Full-fat   cheeses or blue cheese. Nondairy creamers and whipped toppings. Processed cheese, cheese spreads, or cheese  curds. Condiments Onion and garlic salt, seasoned salt, table salt, and sea salt. Canned and packaged gravies. Worcestershire sauce. Tartar sauce. Barbecue sauce. Teriyaki sauce. Soy sauce, including reduced sodium. Steak sauce. Fish sauce. Oyster sauce. Cocktail sauce. Horseradish. Ketchup and mustard. Meat flavorings and tenderizers. Bouillon cubes. Hot sauce. Tabasco sauce. Marinades. Taco seasonings. Relishes. Fats and Oils Butter, stick margarine, lard, shortening, ghee, and bacon fat. Coconut, palm kernel, or palm oils. Regular salad dressings. Other Pickles and olives. Salted popcorn and pretzels. The items listed above may not be a complete list of foods and beverages to avoid. Contact your dietitian for more information. WHERE CAN I FIND MORE INFORMATION? National Heart, Lung, and Blood Institute: travelstabloid.com Document Released: 11/29/2011 Document Revised: 04/26/2014 Document Reviewed: 10/14/2013 [facility] Patient Information 2015 [place], [place]. This information is not intended to replace advice given to you by your health care provider. Make sure you discuss any questions you have with your health care provider. Diabetes and Exercise Exercising regularly is important. It is not just about losing weight. It has many health benefits, such as:  Improving your overall fitness, flexibility, and endurance.  Increasing your bone density.  Helping with weight control.  Decreasing your body fat.  Increasing your muscle strength.  Reducing stress and tension.  Improving your overall health. People with diabetes who exercise gain additional benefits because exercise:  Reduces appetite.  Improves the body's use of blood sugar (glucose).  Helps lower or control blood glucose.  Decreases blood pressure.  Helps control blood lipids (such as cholesterol and triglycerides).  Improves the body's use of the hormone insulin by:  Increasing the  body's insulin sensitivity.  Reducing the body's insulin needs.  Decreases the risk for heart disease because exercising:  Lowers cholesterol and triglycerides levels.  Increases the levels of good cholesterol (such as high-density lipoproteins [HDL]) in the body.  Lowers blood glucose levels. YOUR ACTIVITY PLAN  Choose an activity that you enjoy and set realistic goals. Your health care provider or diabetes educator can help you make an activity plan that works for you. Exercise regularly as directed by your health care provider. This includes:  Performing resistance training twice a week such as push-ups, sit-ups, lifting weights, or using resistance bands.  Performing 150 minutes of cardio exercises each week such as walking, running, or playing sports.  Staying active and spending no more than 90 minutes at one time being inactive. Even short bursts of exercise are good for you. Three 10-minute sessions spread throughout the day are just as beneficial as a single 30-minute session. Some exercise ideas include:  Taking the dog for a walk.  Taking the stairs instead of the elevator.  Dancing to your favorite song.  Doing an exercise video.  Doing your favorite exercise with a friend. RECOMMENDATIONS FOR EXERCISING WITH TYPE 1 OR TYPE 2 DIABETES   Check your blood glucose before exercising. If blood glucose levels are greater than 240 mg/dL, check for urine ketones. Do not exercise if ketones are present.  Avoid injecting insulin into areas of the body that are going to be exercised. For example, avoid injecting insulin into:  The arms when playing tennis.  The legs when jogging.  Keep a record of:  Food intake before and after you exercise.  Expected peak times of insulin action.  Blood glucose levels before and after you exercise.  The type and amount of exercise  you have done.  Review your records with your health care provider. Your health care provider will  help you to develop guidelines for adjusting food intake and insulin amounts before and after exercising.  If you take insulin or oral hypoglycemic agents, watch for signs and symptoms of hypoglycemia. They include:  Dizziness.  Shaking.  Sweating.  Chills.  Confusion.  Drink plenty of water while you exercise to prevent dehydration or heat stroke. Body water is lost during exercise and must be replaced.  Talk to your health care provider before starting an exercise program to make sure it is safe for you. Remember, almost any type of activity is better than none. Document Released: 03/01/2004 Document Revised: 04/26/2014 Document Reviewed: 05/19/2013 [employer] Patient Information 2015 [employer]. This information is not intended to replace advice given to you by your health care provider. Make sure you discuss any questions you have with your health care provider. Basic Carbohydrate Counting for Diabetes Mellitus Carbohydrate counting is a method for keeping track of the amount of carbohydrates you eat. Eating carbohydrates naturally increases the level of sugar (glucose) in your blood, so it is important for you to know the amount that is okay for you to have in every meal. Carbohydrate counting helps keep the level of glucose in your blood within normal limits. The amount of carbohydrates allowed is different for every person. A dietitian can help you calculate the amount that is right for you. Once you know the amount of carbohydrates you can have, you can count the carbohydrates in the foods you want to eat. Carbohydrates are found in the following foods:  Grains, such as breads and cereals.  Dried beans and soy products.  Starchy vegetables, such as potatoes, peas, and corn.  Fruit and fruit juices.  Milk and yogurt.  Sweets and snack foods, such as cake, cookies, candy, chips, soft drinks, and fruit drinks. CARBOHYDRATE COUNTING There are two ways to count the  carbohydrates in your food. You can use either of the methods or a combination of both. Reading the "Nutrition Facts" on Packaged Food The "Nutrition Facts" is an area that is included on the labels of almost all packaged food and beverages in the [place]. It includes the serving size of that food or beverage and information about the nutrients in each serving of the food, including the grams (g) of carbohydrate per serving.  Decide the number of servings of this food or beverage that you will be able to eat or drink. Multiply that number of servings by the number of grams of carbohydrate that is listed on the label for that serving. The total will be the amount of carbohydrates you will be having when you eat or drink this food or beverage. Learning Standard Serving Sizes of Food When you eat food that is not packaged or does not include "Nutrition Facts" on the label, you need to measure the servings in order to count the amount of carbohydrates.A serving of most carbohydrate-rich foods contains about 15 g of carbohydrates. The following list includes serving sizes of carbohydrate-rich foods that provide 15 g ofcarbohydrate per serving:   1 slice of bread (1 oz) or 1 six-inch tortilla.    of a hamburger bun or English muffin.  4-6 crackers.   cup unsweetened dry cereal.    cup hot cereal.   cup rice or pasta.    cup mashed potatoes or  of a large baked potato.  1 cup fresh fruit or one   small piece of fruit.    cup canned or frozen fruit or fruit juice.  1 cup milk.   cup plain fat-free yogurt or yogurt sweetened with artificial sweeteners.   cup cooked dried beans or starchy vegetable, such as peas, corn, or potatoes.  Decide the number of standard-size servings that you will eat. Multiply that number of servings by 15 (the grams of carbohydrates in that serving). For example, if you eat 2 cups of strawberries, you will have eaten 2 servings and 30 g of  carbohydrates (2 servings x 15 g = 30 g). For foods such as soups and casseroles, in which more than one food is mixed in, you will need to count the carbohydrates in each food that is included. EXAMPLE OF CARBOHYDRATE COUNTING Sample Dinner  3 oz chicken breast.   cup of brown rice.   cup of corn.  1 cup milk.   1 cup strawberries with sugar-free whipped topping.  Carbohydrate Calculation Step 1: Identify the foods that contain carbohydrates:   Rice.   Corn.   Milk.   Strawberries. Step 2:Calculate the number of servings eaten of each:   2 servings of rice.   1 serving of corn.   1 serving of milk.   1 serving of strawberries. Step 3: Multiply each of those number of servings by 15 g:   2 servings of rice x 15 g = 30 g.   1 serving of corn x 15 g = 15 g.   1 serving of milk x 15 g = 15 g.   1 serving of strawberries x 15 g = 15 g. Step 4: Add together all of the amounts to find the total grams of carbohydrates eaten: 30 g + 15 g + 15 g + 15 g = 75 g. Document Released: 12/10/2005 Document Revised: 04/26/2014 Document Reviewed: 11/06/2013 [employer] Patient Information 2015 [employer]. This information is not intended to replace advice given to you by your health care provider. Make sure you discuss any questions you have with your health care provider.  

## 2014-11-29 NOTE — Progress Notes (Signed)
Pt here to f/u with HTN,DM management Pt is taking Janumet daily with being compliant C/o left knee pain unrelieved by Tylenol #3. States he is taking otc medication for pain Denies numb/tingling, n,v or dizziness

## 2015-01-03 ENCOUNTER — Telehealth: Payer: Self-pay

## 2015-01-03 NOTE — Telephone Encounter (Signed)
Pt was referred by Dr. Doreene Burke for screening colonoscopy. He said he has a hx of colon cancer. We had mailed him a letter in December and it was returned. I have updated his address in chart. He has appt 02/04/2015 at 10:00 Am with Laban Emperor, NP.   I have mailed him a reminder letter and a mapquest map for the directions. He said he lives about 2 and 1/2 hours away but he will come this way and stay with someone overnite prior to appt. He has [facility] benefits.

## 2015-02-04 ENCOUNTER — Ambulatory Visit: Payer: Self-pay | Admitting: Gastroenterology

## 2015-04-19 ENCOUNTER — Telehealth: Payer: Self-pay | Admitting: Internal Medicine

## 2015-04-19 DIAGNOSIS — E785 Hyperlipidemia, unspecified: Secondary | ICD-10-CM

## 2015-04-19 DIAGNOSIS — E119 Type 2 diabetes mellitus without complications: Secondary | ICD-10-CM

## 2015-04-19 DIAGNOSIS — I10 Essential (primary) hypertension: Secondary | ICD-10-CM

## 2015-04-19 MED ORDER — LISINOPRIL-HYDROCHLOROTHIAZIDE 20-12.5 MG PO TABS: 1.0000 | ORAL_TABLET | Freq: Every day | ORAL | Status: DC

## 2015-04-19 MED ORDER — SITAGLIPTIN PHOS-METFORMIN HCL 50-1000 MG PO TABS: 1.0000 | ORAL_TABLET | Freq: Every day | ORAL | Status: DC

## 2015-04-19 NOTE — Telephone Encounter (Signed)
Patient called to request a med refill for all of his current medications, patient has an upcoming appointment on 5/16 but he needs enough medication to last him until then. Patient uses our pharmacy.Please f/u

## 2015-04-19 NOTE — Telephone Encounter (Signed)
Patient called to clarify which medications he needs because according to Epic he last got his medications for blood pressure and diabetes in November 2015.  He was given 90 tablets, enough for 3 months with 3 refills.  Patient adamant that he did not get that many pills and he is out.  Refilled Lisinopril and Janumet gave 30 pills enough to last patient until his appointment on 05/09/15

## 2015-05-09 ENCOUNTER — Ambulatory Visit: Payer: Self-pay | Attending: Internal Medicine | Admitting: Internal Medicine

## 2015-05-09 ENCOUNTER — Encounter: Payer: Self-pay | Admitting: Internal Medicine

## 2015-05-09 VITALS — BP 155/98 | HR 81 | Temp 98.0°F | Resp 16 | Wt 245.0 lb

## 2015-05-09 DIAGNOSIS — Z9114 Patient's other noncompliance with medication regimen: Secondary | ICD-10-CM | POA: Insufficient documentation

## 2015-05-09 DIAGNOSIS — I10 Essential (primary) hypertension: Secondary | ICD-10-CM | POA: Insufficient documentation

## 2015-05-09 DIAGNOSIS — F17210 Nicotine dependence, cigarettes, uncomplicated: Secondary | ICD-10-CM | POA: Insufficient documentation

## 2015-05-09 DIAGNOSIS — E119 Type 2 diabetes mellitus without complications: Secondary | ICD-10-CM | POA: Insufficient documentation

## 2015-05-09 DIAGNOSIS — Z85038 Personal history of other malignant neoplasm of large intestine: Secondary | ICD-10-CM | POA: Insufficient documentation

## 2015-05-09 DIAGNOSIS — E785 Hyperlipidemia, unspecified: Secondary | ICD-10-CM | POA: Insufficient documentation

## 2015-05-09 DIAGNOSIS — E139 Other specified diabetes mellitus without complications: Secondary | ICD-10-CM

## 2015-05-09 LAB — GLUCOSE, POCT (MANUAL RESULT ENTRY): POC Glucose: 287 mg/dl — AB (ref 70–99)

## 2015-05-09 LAB — POCT GLYCOSYLATED HEMOGLOBIN (HGB A1C): Hemoglobin A1C: 9.5

## 2015-05-09 MED ORDER — SITAGLIPTIN PHOS-METFORMIN HCL 50-1000 MG PO TABS: 1.0000 | ORAL_TABLET | Freq: Every day | ORAL | Status: DC

## 2015-05-09 MED ORDER — LISINOPRIL-HYDROCHLOROTHIAZIDE 20-12.5 MG PO TABS: 1.0000 | ORAL_TABLET | Freq: Every day | ORAL | Status: DC

## 2015-05-09 NOTE — Progress Notes (Signed)
Patient here for follow up on his diabetes and for medication refills Patient has been out of his medications for two weeks Patient states this will be his last visit with our office Patient has decided to relocate at a area that is further from here

## 2015-05-09 NOTE — Progress Notes (Signed)
Patient ID: Kyle Merritt, male   DOB: Feb 21, 1961, 54 y.o.   MRN: [id number]   Kyle Merritt, is a 54 y.o. male  FAO:[id number]  ONG:[id number]  DOB - 1961/03/13  Chief Complaint  Patient presents with  . Follow-up        Subjective:   Kyle Merritt is a 54 y.o. male here today for a follow up visit. Patient has history of hypertension, diabetes mellitus without complications and dyslipidemia here today for follow-up. Patient has no complaint today. Patient has not been taking his medications for diabetes or hypertension due to lack of insurance and not being able to afford medications. Patient recently relocated to the [facility], he has not found a doctor yet so came here today to refill all his medications pending being able to find a primary care physician. Blood pressure and blood sugar has not been controlled due to noncompliance. Patient reports feeling good because he recently found a good job that is going to offer him insurance, he has been working out and losing weight. He continues to smoke cigarettes about 4 sticks of cigarettes per day, trying to calm down and quit. Patient drinks alcohol occasionally. Patient has No headache, No chest pain, No abdominal pain - No Nausea, No new weakness tingling or numbness, No Cough - SOB.  No problems updated.  ALLERGIES: No Known Allergies  PAST MEDICAL HISTORY: Past Medical History  Diagnosis Date  . Hypertension   . Diabetes mellitus without complication   . High cholesterol   . Colon cancer     MEDICATIONS AT HOME: Prior to Admission medications   Medication Sig Start Date End Date Taking? Authorizing Provider  acetaminophen-codeine (TYLENOL #3) 300-30 MG per tablet Take 1 tablet by mouth every 4 (four) hours as needed. Patient not taking: Reported on 11/29/2014 10/04/14   Tresa Garter, MD  diclofenac sodium (VOLTAREN) 1 % GEL Apply 2 g topically 4 (four) times daily. Patient not taking: Reported on 11/29/2014 08/02/14    Tresa Garter, MD  lisinopril-hydrochlorothiazide (PRINZIDE,ZESTORETIC) 20-12.5 MG per tablet Take 1 tablet by mouth daily. 05/09/15   Tresa Garter, MD  pravastatin (PRAVACHOL) 40 MG tablet Take 1 tablet (40 mg total) by mouth daily. 11/05/14   Tresa Garter, MD  sildenafil (VIAGRA) 100 MG tablet Take 0.5-1 tablets (50-100 mg total) by mouth daily as needed for erectile dysfunction. 10/12/14   Tresa Garter, MD  sitaGLIPtin-metformin (JANUMET) 50-1000 MG per tablet Take 1 tablet by mouth daily. 05/09/15   Tresa Garter, MD     Objective:   Filed Vitals:   05/09/15 1055  BP: 155/98  Pulse: 81  Temp: 98 F (36.7 C)  Resp: 16  Weight: 245 lb (111.131 kg)  SpO2: 100%    Exam General appearance : Awake, alert, not in any distress. Speech Clear. Not toxic looking HEENT: Atraumatic and Normocephalic, pupils equally reactive to light and accomodation Neck: supple, no JVD. No cervical lymphadenopathy.  Chest:Good air entry bilaterally, no added sounds  CVS: S1 S2 regular, no murmurs.  Abdomen: Bowel sounds present, Non tender and not distended with no gaurding, rigidity or rebound. Extremities: B/L Lower Ext shows no edema, both legs are warm to touch Neurology: Awake alert, and oriented X 3, CN II-XII intact, Non focal Skin:No Rash  Data Review Lab Results  Component Value Date   HGBA1C 9.50 05/09/2015   HGBA1C 8.6 11/29/2014   HGBA1C 7.0 08/02/2014     Assessment & Plan   1.  Other specified diabetes mellitus without complications  - Glucose (CBG) - HgB A1c has gone up today to 9.5%, patient has not been compliant with medications. - Patient has been counseled extensively about being compliant with medications, the complications and consequences of noncompliance was discussed. Patient verbalized understanding and promised to be compliant henceforth now that he has insurance.  2. Essential hypertension: Uncontrolled due to noncompliance  - We  have discussed target BP range and blood pressure goal - I have advised patient to check BP regularly and to call us back or report to clinic if the numbers are consistently higher than 140/90  - We discussed the importance of compliance with medical therapy and DASH diet recommended, consequences of uncontrolled hypertension discussed.  - continue current BP medications  - lisinopril-hydrochlorothiazide (PRINZIDE,ZESTORETIC) 20-12.5 MG per tablet; Take 1 tablet by mouth daily.  Dispense: 90 tablet; Refill: 3  3. Dyslipidemia  To address this please limit saturated fat to no more than 7% of your calories, limit cholesterol to 200 mg/day, increase fiber and exercise as tolerated. If needed we may add another cholesterol lowering medication to your regimen.   4. Type 2 diabetes mellitus without complication  - sitaGLIPtin-metformin (JANUMET) 50-1000 MG per tablet; Take 1 tablet by mouth daily.  Dispense: 90 tablet; Refill: 3  Aim for 2-3 Carb Choices per meal (30-45 grams) +/- 1 either way  Aim for 0-15 Carbs per snack if hungry  Include protein in moderation with your meals and snacks  Consider reading food labels for Total Carbohydrate and Fat Grams of foods  Consider checking BG at alternate times per day  Continue taking medication as directed Fruit Punch - find one with no sugar  Measure and decrease portions of carbohydrate foods  Make your plate and don't go back for seconds   Patient have been counseled extensively about nutrition and exercise  The patient was counseled on the dangers of tobacco use, and was advised to quit. Reviewed strategies to maximize success, including removing cigarettes and smoking materials from environment, stress management and support of family/friends.  Return in about 3 months (around 08/09/2015), or if symptoms worsen or fail to improve, for Hemoglobin A1C and Follow up, DM, Follow up HTN, Follow up Pain and comorbidities.  The patient was given  clear instructions to go to ER or return to medical center if symptoms don't improve, worsen or new problems develop. The patient verbalized understanding. The patient was told to call to get lab results if they haven't heard anything in the next week.   This note has been created with Surveyor, quantity. Any transcriptional errors are unintentional.    Angelica Chessman, MD, [place], [place], [place], [facility] and [facility] [place], [place]   05/09/2015, 11:41 AM

## 2015-05-09 NOTE — Patient Instructions (Signed)
Dyslipidemia Dyslipidemia is an imbalance of the lipids in your blood. Lipids are waxy, fat-like proteins that your body needs in small amounts. Dyslipidemia often involves the lipids cholesterol or triglycerides. Common forms of dyslipidemia are:  High levels of bad cholesterol (LDL cholesterol). LDL cholesterol is the type of cholesterol that causes heart disease.  Low levels of good cholesterol (HDL cholesterol). HDL cholesterol is the type of cholesterol that helps protect against heart disease.  High levels of triglycerides. Triglycerides are a fatty substance in the blood linked to a buildup of plaque on your arteries. RISK FACTORS  Increased age.  Having a family history of high cholesterol.  Certain medicines, including birth control pills, diuretics, beta-blockers, and some medicines for depression.  Smoking.  Eating a high-fat diet.  Being overweight.  Medical conditions such as diabetes, polycystic ovary syndrome, pregnancy, kidney disease, and hypothyroidism.  Lack of regular exercise. SIGNS AND SYMPTOMS There are no signs or symptoms with dyslipidemia.  DIAGNOSIS  A simple blood test called a fasting blood test can be done to determine your level of:  Total cholesterol. This is the combined number of LDL cholesterol and HDL cholesterol. A healthy number is lower than 200.  LDL cholesterol. The goal number for LDL cholesterol is different for each person depending on risk factors. Ask your health care provider what your LDL cholesterol number should be.  HDL cholesterol. A healthy level of HDL cholesterol is 60 or higher. A number lower than 40 for men or 50 for women is a danger sign.  Triglycerides. A healthy triglyceride number is less than 150. TREATMENT  Dyslipidemia is a treatable condition. Your health care provider will advise you on what type of treatment is best based on your age, your test results, and current guidelines. Treatment may include:   Dietary  changes. A dietitian can help you create a meal plan. You may need to:  Eat more foods that contain omega-3s, such as salmon and other fish.  Replace saturated fats and trans fats in your diet with healthy fats such as nuts, seeds, avocados, olive oil, and canola oil.  Regular exercise. This can help lower your LDL cholesterol, raise your HDL cholesterol, and help with weight management. Check with your health care provider before beginning an exercise program. Most people should participate in 30 minutes of brisk exercise 5 days a week.  Quitting smoking.  Medicines to lower LDL cholesterol and triglycerides. Your health care provider will monitor your lipid levels with regular blood tests. HOME CARE INSTRUCTIONS  Eat a healthy diet. Follow any diet instructions if they were given to you by your health care provider.  Maintain a healthy weight.  Exercise regularly based on the recommendations of your health care provider.  Do not use any tobacco products, including cigarettes, chewing tobacco, or electronic cigarettes.  Take medicines only as directed by your health care provider.  Keep all follow-up visits as directed by your health care provider. SEEK MEDICAL CARE IF: You are having possible side effects from your medicines. Document Released: 12/15/2013 Document Revised: 04/26/2014 Document Reviewed: 12/15/2013 [facility] Patient Information 2015 [place], [place]. This information is not intended to replace advice given to you by your health care provider. Make sure you discuss any questions you have with your health care provider. DASH Eating Plan DASH stands for "Dietary Approaches to Stop Hypertension." The DASH eating plan is a healthy eating plan that has been shown to reduce high blood pressure (hypertension). Additional health benefits may include reducing  the risk of type 2 diabetes mellitus, heart disease, and stroke. The DASH eating plan may also help with weight loss. WHAT  DO I NEED TO KNOW ABOUT THE DASH EATING PLAN? For the DASH eating plan, you will follow these general guidelines:  Choose foods with a percent daily value for sodium of less than 5% (as listed on the food label).  Use salt-free seasonings or herbs instead of table salt or sea salt.  Check with your health care provider or pharmacist before using salt substitutes.  Eat lower-sodium products, often labeled as "lower sodium" or "no salt added."  Eat fresh foods.  Eat more vegetables, fruits, and low-fat dairy products.  Choose whole grains. Look for the word "whole" as the first word in the ingredient list.  Choose fish and skinless chicken or [place] more often than red meat. Limit fish, poultry, and meat to 6 oz (170 g) each day.  Limit sweets, desserts, sugars, and sugary drinks.  Choose heart-healthy fats.  Limit cheese to 1 oz (28 g) per day.  Eat more home-cooked food and less restaurant, buffet, and fast food.  Limit fried foods.  Cook foods using methods other than frying.  Limit canned vegetables. If you do use them, rinse them well to decrease the sodium.  When eating at a restaurant, ask that your food be prepared with less salt, or no salt if possible. WHAT FOODS CAN I EAT? Seek help from a dietitian for individual calorie needs. Grains Whole grain or whole wheat bread. Brown rice. Whole grain or whole wheat pasta. Quinoa, bulgur, and whole grain cereals. Low-sodium cereals. Corn or whole wheat flour tortillas. Whole grain cornbread. Whole grain crackers. Low-sodium crackers. Vegetables Fresh or frozen vegetables (raw, steamed, roasted, or grilled). Low-sodium or reduced-sodium tomato and vegetable juices. Low-sodium or reduced-sodium tomato sauce and paste. Low-sodium or reduced-sodium canned vegetables.  Fruits All fresh, canned (in natural juice), or frozen fruits. Meat and Other Protein Products Ground beef (85% or leaner), grass-fed beef, or beef trimmed of  fat. Skinless chicken or [place]. Ground chicken or [place]. Pork trimmed of fat. All fish and seafood. Eggs. Dried beans, peas, or lentils. Unsalted nuts and seeds. Unsalted canned beans. Dairy Low-fat dairy products, such as skim or 1% milk, 2% or reduced-fat cheeses, low-fat ricotta or cottage cheese, or plain low-fat yogurt. Low-sodium or reduced-sodium cheeses. Fats and Oils Tub margarines without trans fats. Light or reduced-fat mayonnaise and salad dressings (reduced sodium). Avocado. Safflower, olive, or canola oils. Natural peanut or almond butter. Other Unsalted popcorn and pretzels. The items listed above may not be a complete list of recommended foods or beverages. Contact your dietitian for more options. WHAT FOODS ARE NOT RECOMMENDED? Grains White bread. White pasta. White rice. Refined cornbread. Bagels and croissants. Crackers that contain trans fat. Vegetables Creamed or fried vegetables. Vegetables in a cheese sauce. Regular canned vegetables. Regular canned tomato sauce and paste. Regular tomato and vegetable juices. Fruits Dried fruits. Canned fruit in light or heavy syrup. Fruit juice. Meat and Other Protein Products Fatty cuts of meat. Ribs, chicken wings, bacon, sausage, bologna, salami, chitterlings, fatback, hot dogs, bratwurst, and packaged luncheon meats. Salted nuts and seeds. Canned beans with salt. Dairy Whole or 2% milk, cream, half-and-half, and cream cheese. Whole-fat or sweetened yogurt. Full-fat cheeses or blue cheese. Nondairy creamers and whipped toppings. Processed cheese, cheese spreads, or cheese curds. Condiments Onion and garlic salt, seasoned salt, table salt, and sea salt. Canned and packaged gravies. Worcestershire sauce. Tartar  sauce. Barbecue sauce. Teriyaki sauce. Soy sauce, including reduced sodium. Steak sauce. Fish sauce. Oyster sauce. Cocktail sauce. Horseradish. Ketchup and mustard. Meat flavorings and tenderizers. Bouillon cubes. Hot sauce.  Tabasco sauce. Marinades. Taco seasonings. Relishes. Fats and Oils Butter, stick margarine, lard, shortening, ghee, and bacon fat. Coconut, palm kernel, or palm oils. Regular salad dressings. Other Pickles and olives. Salted popcorn and pretzels. The items listed above may not be a complete list of foods and beverages to avoid. Contact your dietitian for more information. WHERE CAN I FIND MORE INFORMATION? National Heart, Lung, and Blood Institute: travelstabloid.com Document Released: 11/29/2011 Document Revised: 04/26/2014 Document Reviewed: 10/14/2013 [facility] Patient Information 2015 [place], [place]. This information is not intended to replace advice given to you by your health care provider. Make sure you discuss any questions you have with your health care provider. Hypertension Hypertension, commonly called high blood pressure, is when the force of blood pumping through your arteries is too strong. Your arteries are the blood vessels that carry blood from your heart throughout your body. A blood pressure reading consists of a higher number over a lower number, such as 110/72. The higher number (systolic) is the pressure inside your arteries when your heart pumps. The lower number (diastolic) is the pressure inside your arteries when your heart relaxes. Ideally you want your blood pressure below 120/80. Hypertension forces your heart to work harder to pump blood. Your arteries may become narrow or stiff. Having hypertension puts you at risk for heart disease, stroke, and other problems.  RISK FACTORS Some risk factors for high blood pressure are controllable. Others are not.  Risk factors you cannot control include:   Race. You may be at higher risk if you are African American.  Age. Risk increases with age.  Gender. Men are at higher risk than women before age 1 years. After age 49, women are at higher risk than men. Risk factors you can control  include:  Not getting enough exercise or physical activity.  Being overweight.  Getting too much fat, sugar, calories, or salt in your diet.  Drinking too much alcohol. SIGNS AND SYMPTOMS Hypertension does not usually cause signs or symptoms. Extremely high blood pressure (hypertensive crisis) may cause headache, anxiety, shortness of breath, and nosebleed. DIAGNOSIS  To check if you have hypertension, your health care provider will measure your blood pressure while you are seated, with your arm held at the level of your heart. It should be measured at least twice using the same arm. Certain conditions can cause a difference in blood pressure between your right and left arms. A blood pressure reading that is higher than normal on one occasion does not mean that you need treatment. If one blood pressure reading is high, ask your health care provider about having it checked again. TREATMENT  Treating high blood pressure includes making lifestyle changes and possibly taking medicine. Living a healthy lifestyle can help lower high blood pressure. You may need to change some of your habits. Lifestyle changes may include:  Following the DASH diet. This diet is high in fruits, vegetables, and whole grains. It is low in salt, red meat, and added sugars.  Getting at least 2 hours of brisk physical activity every week.  Losing weight if necessary.  Not smoking.  Limiting alcoholic beverages.  Learning ways to reduce stress. If lifestyle changes are not enough to get your blood pressure under control, your health care provider may prescribe medicine. You may need to take more than  one. Work closely with your health care provider to understand the risks and benefits. HOME CARE INSTRUCTIONS  Have your blood pressure rechecked as directed by your health care provider.   Take medicines only as directed by your health care provider. Follow the directions carefully. Blood pressure medicines must  be taken as prescribed. The medicine does not work as well when you skip doses. Skipping doses also puts you at risk for problems.   Do not smoke.   Monitor your blood pressure at home as directed by your health care provider. SEEK MEDICAL CARE IF:   You think you are having a reaction to medicines taken.  You have recurrent headaches or feel dizzy.  You have swelling in your ankles.  You have trouble with your vision. SEEK IMMEDIATE MEDICAL CARE IF:  You develop a severe headache or confusion.  You have unusual weakness, numbness, or feel faint.  You have severe chest or abdominal pain.  You vomit repeatedly.  You have trouble breathing. MAKE SURE YOU:   Understand these instructions.  Will watch your condition.  Will get help right away if you are not doing well or get worse. Document Released: 12/10/2005 Document Revised: 04/26/2014 Document Reviewed: 10/02/2013 [facility] Patient Information 2015 [place], [place]. This information is not intended to replace advice given to you by your health care provider. Make sure you discuss any questions you have with your health care provider. Diabetes and Exercise Exercising regularly is important. It is not just about losing weight. It has many health benefits, such as:  Improving your overall fitness, flexibility, and endurance.  Increasing your bone density.  Helping with weight control.  Decreasing your body fat.  Increasing your muscle strength.  Reducing stress and tension.  Improving your overall health. People with diabetes who exercise gain additional benefits because exercise:  Reduces appetite.  Improves the body's use of blood sugar (glucose).  Helps lower or control blood glucose.  Decreases blood pressure.  Helps control blood lipids (such as cholesterol and triglycerides).  Improves the body's use of the hormone insulin by:  Increasing the body's insulin sensitivity.  Reducing the body's  insulin needs.  Decreases the risk for heart disease because exercising:  Lowers cholesterol and triglycerides levels.  Increases the levels of good cholesterol (such as high-density lipoproteins [HDL]) in the body.  Lowers blood glucose levels. YOUR ACTIVITY PLAN  Choose an activity that you enjoy and set realistic goals. Your health care provider or diabetes educator can help you make an activity plan that works for you. Exercise regularly as directed by your health care provider. This includes:  Performing resistance training twice a week such as push-ups, sit-ups, lifting weights, or using resistance bands.  Performing 150 minutes of cardio exercises each week such as walking, running, or playing sports.  Staying active and spending no more than 90 minutes at one time being inactive. Even short bursts of exercise are good for you. Three 10-minute sessions spread throughout the day are just as beneficial as a single 30-minute session. Some exercise ideas include:  Taking the dog for a walk.  Taking the stairs instead of the elevator.  Dancing to your favorite song.  Doing an exercise video.  Doing your favorite exercise with a friend. RECOMMENDATIONS FOR EXERCISING WITH TYPE 1 OR TYPE 2 DIABETES   Check your blood glucose before exercising. If blood glucose levels are greater than 240 mg/dL, check for urine ketones. Do not exercise if ketones are present.  Avoid injecting insulin  into areas of the body that are going to be exercised. For example, avoid injecting insulin into:  The arms when playing tennis.  The legs when jogging.  Keep a record of:  Food intake before and after you exercise.  Expected peak times of insulin action.  Blood glucose levels before and after you exercise.  The type and amount of exercise you have done.  Review your records with your health care provider. Your health care provider will help you to develop guidelines for adjusting food  intake and insulin amounts before and after exercising.  If you take insulin or oral hypoglycemic agents, watch for signs and symptoms of hypoglycemia. They include:  Dizziness.  Shaking.  Sweating.  Chills.  Confusion.  Drink plenty of water while you exercise to prevent dehydration or heat stroke. Body water is lost during exercise and must be replaced.  Talk to your health care provider before starting an exercise program to make sure it is safe for you. Remember, almost any type of activity is better than none. Document Released: 03/01/2004 Document Revised: 04/26/2014 Document Reviewed: 05/19/2013 [facility] Patient Information 2015 [place], [place]. This information is not intended to replace advice given to you by your health care provider. Make sure you discuss any questions you have with your health care provider. Basic Carbohydrate Counting for Diabetes Mellitus Carbohydrate counting is a method for keeping track of the amount of carbohydrates you eat. Eating carbohydrates naturally increases the level of sugar (glucose) in your blood, so it is important for you to know the amount that is okay for you to have in every meal. Carbohydrate counting helps keep the level of glucose in your blood within normal limits. The amount of carbohydrates allowed is different for every person. A dietitian can help you calculate the amount that is right for you. Once you know the amount of carbohydrates you can have, you can count the carbohydrates in the foods you want to eat. Carbohydrates are found in the following foods:  Grains, such as breads and cereals.  Dried beans and soy products.  Starchy vegetables, such as potatoes, peas, and corn.  Fruit and fruit juices.  Milk and yogurt.  Sweets and snack foods, such as cake, cookies, candy, chips, soft drinks, and fruit drinks. CARBOHYDRATE COUNTING There are two ways to count the carbohydrates in your food. You can use either of the  methods or a combination of both. Reading the "Nutrition Facts" on [place] The "Nutrition Facts" is an area that is included on the labels of almost all packaged food and beverages in the [place]. It includes the serving size of that food or beverage and information about the nutrients in each serving of the food, including the grams (g) of carbohydrate per serving.  Decide the number of servings of this food or beverage that you will be able to eat or drink. Multiply that number of servings by the number of grams of carbohydrate that is listed on the label for that serving. The total will be the amount of carbohydrates you will be having when you eat or drink this food or beverage. Learning Standard Serving Sizes of Food When you eat food that is not packaged or does not include "Nutrition Facts" on the label, you need to measure the servings in order to count the amount of carbohydrates.A serving of most carbohydrate-rich foods contains about 15 g of carbohydrates. The following list includes serving sizes of carbohydrate-rich foods that provide 15 g ofcarbohydrate per  serving:   1 slice of bread (1 oz) or 1 six-inch tortilla.    of a hamburger bun or English muffin.  4-6 crackers.   cup unsweetened dry cereal.    cup hot cereal.   cup rice or pasta.    cup mashed potatoes or  of a large baked potato.  1 cup fresh fruit or one small piece of fruit.    cup canned or frozen fruit or fruit juice.  1 cup milk.   cup plain fat-free yogurt or yogurt sweetened with artificial sweeteners.   cup cooked dried beans or starchy vegetable, such as peas, corn, or potatoes.  Decide the number of standard-size servings that you will eat. Multiply that number of servings by 15 (the grams of carbohydrates in that serving). For example, if you eat 2 cups of strawberries, you will have eaten 2 servings and 30 g of carbohydrates (2 servings x 15 g = 30 g). For foods such as  soups and casseroles, in which more than one food is mixed in, you will need to count the carbohydrates in each food that is included. EXAMPLE OF CARBOHYDRATE COUNTING Sample Dinner  3 oz chicken breast.   cup of brown rice.   cup of corn.  1 cup milk.   1 cup strawberries with sugar-free whipped topping.  Carbohydrate Calculation Step 1: Identify the foods that contain carbohydrates:   Rice.   Corn.   Milk.   Strawberries. Step 2:Calculate the number of servings eaten of each:   2 servings of rice.   1 serving of corn.   1 serving of milk.   1 serving of strawberries. Step 3: Multiply each of those number of servings by 15 g:   2 servings of rice x 15 g = 30 g.   1 serving of corn x 15 g = 15 g.   1 serving of milk x 15 g = 15 g.   1 serving of strawberries x 15 g = 15 g. Step 4: Add together all of the amounts to find the total grams of carbohydrates eaten: 30 g + 15 g + 15 g + 15 g = 75 g. Document Released: 12/10/2005 Document Revised: 04/26/2014 Document Reviewed: 11/06/2013 [facility] Patient Information 2015 [place], [place]. This information is not intended to replace advice given to you by your health care provider. Make sure you discuss any questions you have with your health care provider.

## 2015-10-17 ENCOUNTER — Other Ambulatory Visit: Payer: Self-pay | Admitting: Internal Medicine

## 2015-10-24 NOTE — Telephone Encounter (Signed)
Pt. Called stating that the [facility] pharmacy called stating that the nurse had responded back about pt. Medication. Pt. Would like to speak to nurse. Please f.u

## 2015-10-26 ENCOUNTER — Telehealth: Payer: Self-pay | Admitting: Internal Medicine

## 2015-10-26 NOTE — Telephone Encounter (Signed)
Patient called requesting a medication refill for sildenafil (VIAGRA)

## 2015-11-01 ENCOUNTER — Other Ambulatory Visit: Payer: Self-pay | Admitting: *Deleted

## 2015-11-01 DIAGNOSIS — N528 Other male erectile dysfunction: Secondary | ICD-10-CM

## 2015-11-01 MED ORDER — SILDENAFIL CITRATE 100 MG PO TABS: 50.0000 mg | ORAL_TABLET | Freq: Every day | ORAL | Status: DC | PRN

## 2015-11-01 NOTE — Telephone Encounter (Signed)
Patients Viagra has been refilled with three refills.

## 2015-11-07 ENCOUNTER — Other Ambulatory Visit: Payer: Self-pay | Admitting: Internal Medicine

## 2015-11-28 ENCOUNTER — Other Ambulatory Visit: Payer: Self-pay | Admitting: Internal Medicine

## 2015-12-27 MED FILL — LISINOPRIL-HCTZ 20-12.5 MG: 20-12.5 | 90 days supply | Qty: 90 | Fill #2

## 2015-12-28 MED FILL — JANUMET 50-1,000 MG TABLET: 50-1000 | 30 days supply | Qty: 30 | Fill #0

## 2016-01-19 ENCOUNTER — Inpatient Hospital Stay
Admit: 2016-01-19 | Discharge: 2016-01-19 | Disposition: A | Discharge status: Home / Self Care | Payer: Self-pay | Attending: Emergency Medicine

## 2016-01-19 DIAGNOSIS — S161XXA Strain of muscle, fascia and tendon at neck level, initial encounter: Secondary | ICD-10-CM

## 2016-01-19 MED ORDER — NAPROXEN 500 MG TAB
500 mg | ORAL_TABLET | Freq: Two times a day (BID) | ORAL | 0 refills | Status: AC | PRN
Start: 2016-01-19 — End: ?

## 2016-01-19 MED ORDER — CYCLOBENZAPRINE 10 MG TAB
10 mg | ORAL_TABLET | Freq: Three times a day (TID) | ORAL | 0 refills | Status: AC | PRN
Start: 2016-01-19 — End: ?
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
## 2016-01-19 NOTE — Other (Signed)
Tiffany PA reviewed discharge instructions with the patient.  The patient verbalized understanding.  Alert and stable to walk at discharge.

## 2018-01-16 ENCOUNTER — Encounter (HOSPITAL_COMMUNITY): Payer: Self-pay | Admitting: Emergency Medicine

## 2018-01-16 ENCOUNTER — Ambulatory Visit (HOSPITAL_COMMUNITY)
Admission: EM | Admit: 2018-01-16 | Discharge: 2018-01-16 | Disposition: A | Discharge status: Home / Self Care | Payer: Self-pay | Attending: Family Medicine | Admitting: Family Medicine

## 2018-01-16 DIAGNOSIS — M79602 Pain in left arm: Secondary | ICD-10-CM

## 2018-01-16 DIAGNOSIS — M542 Cervicalgia: Secondary | ICD-10-CM

## 2018-01-16 MED ORDER — PREDNISONE 10 MG (21) PO TBPK: ORAL_TABLET | ORAL | 0 refills | Status: DC

## 2018-01-16 MED ORDER — IBUPROFEN 800 MG PO TABS
ORAL_TABLET | ORAL | Status: AC
  Filled 2018-01-16: qty 1

## 2018-01-16 MED ORDER — BLOOD GLUCOSE MONITOR KIT: PACK | 0 refills | Status: DC

## 2018-01-16 NOTE — Discharge Instructions (Signed)
Prednisone will cause an elevation in your blood sugar. Please watch closely.

## 2018-01-16 NOTE — ED Triage Notes (Signed)
PT reports left arm pain and neck soreness for 2 weeks. Pain is worse with ROM exercises.

## 2018-01-21 NOTE — ED Provider Notes (Signed)
  [place]   [id number] 01/16/18 Arrival Time: 2549  ASSESSMENT & PLAN:  1. Left arm pain   2. Neck pain   Likely muscular. Reports NSAIDS aren't working well. Trial of:  Meds ordered this encounter  Medications  . predniSONE (STERAPRED UNI-PAK 21 TAB) 10 MG (21) TBPK tablet    Sig: Take as directed.    Dispense:  21 tablet    Refill:  0   Will f/u here if not seeing improvement over the next few days. Reviewed expectations re: course of current medical issues. Questions answered. Outlined signs and symptoms indicating need for more acute intervention. Patient verbalized understanding. After Visit Summary given.  SUBJECTIVE: History from: patient. Kyle Merritt is a 57 y.o. male who reports:  Pain/Discomfort of: left neck and arm Onset gradual, several weeks ago Frequency: intermittent Injury/trama: no Describes as: dull without radiation Severity: mild Progression: stable Relieved by: nothing in particular Worsened by: movement but not reproducable Associated symptoms: none reported Extremity sensation changes or weakness: none Self treatment: tried OTCs without relief of pain  History of similar: no  ROS: As per HPI.   OBJECTIVE:  Vitals:   01/16/18 1735 01/16/18 1740  BP:  (!) 167/103  Weight: 225 lb (102.1 kg)     General appearance: alert; no distress Extremities: no cyanosis or edema; symmetrical with no gross deformities; mild and poorly localized tenderness over his left lateral neck and over his L shoulder area with no swelling and no bruising; ROM: normal; no bony tenderness CV: normal extremity capillary refill Skin: warm and dry Neurologic: normal gait; normal symmetric reflexes in all extremities; normal sensation in all extremities Psychological: alert and cooperative; normal mood and affect  No Known Allergies  Past Medical History:  Diagnosis Date  . Colon cancer ([place])   . Diabetes mellitus without complication ([place])   .  High cholesterol   . Hypertension    Social History   Socioeconomic History  . Marital status: Divorced    Spouse name: Not on file  . Number of children: Not on file  . Years of education: Not on file  . Highest education level: Not on file  Social Needs  . Financial resource strain: Not on file  . Food insecurity - worry: Not on file  . Food insecurity - inability: Not on file  . Transportation needs - medical: Not on file  . Transportation needs - non-medical: Not on file  Occupational History  . Not on file  Tobacco Use  . Smoking status: Never Smoker  . Smokeless tobacco: Never Used  Substance and Sexual Activity  . Alcohol use: No  . Drug use: No  . Sexual activity: Yes  Other Topics Concern  . Not on file  Social History Narrative  . Not on file   No family history on file. Past Surgical History:  Procedure Laterality Date  . COLON SURGERY        Vanessa Kick, MD 01/21/18 [phone]

## 2018-01-29 ENCOUNTER — Encounter: Payer: Self-pay | Admitting: Family Medicine

## 2018-01-29 ENCOUNTER — Ambulatory Visit (INDEPENDENT_AMBULATORY_CARE_PROVIDER_SITE_OTHER): Payer: Self-pay | Admitting: Family Medicine

## 2018-01-29 VITALS — BP 150/100 | HR 80 | Temp 98.7°F | Ht 75.0 in | Wt 226.0 lb

## 2018-01-29 DIAGNOSIS — M25462 Effusion, left knee: Secondary | ICD-10-CM

## 2018-01-29 DIAGNOSIS — G8929 Other chronic pain: Secondary | ICD-10-CM

## 2018-01-29 DIAGNOSIS — E119 Type 2 diabetes mellitus without complications: Secondary | ICD-10-CM

## 2018-01-29 DIAGNOSIS — E785 Hyperlipidemia, unspecified: Secondary | ICD-10-CM

## 2018-01-29 DIAGNOSIS — Z1159 Encounter for screening for other viral diseases: Secondary | ICD-10-CM

## 2018-01-29 DIAGNOSIS — Z23 Encounter for immunization: Secondary | ICD-10-CM

## 2018-01-29 DIAGNOSIS — F1491 Cocaine use, unspecified, in remission: Secondary | ICD-10-CM

## 2018-01-29 DIAGNOSIS — Z87898 Personal history of other specified conditions: Secondary | ICD-10-CM

## 2018-01-29 DIAGNOSIS — Z131 Encounter for screening for diabetes mellitus: Secondary | ICD-10-CM

## 2018-01-29 DIAGNOSIS — M25562 Pain in left knee: Secondary | ICD-10-CM

## 2018-01-29 DIAGNOSIS — I10 Essential (primary) hypertension: Secondary | ICD-10-CM

## 2018-01-29 LAB — POCT URINALYSIS DIP (DEVICE)
Bilirubin Urine: NEGATIVE
Glucose, UA: NEGATIVE mg/dL
Hgb urine dipstick: NEGATIVE
Ketones, ur: NEGATIVE mg/dL
Nitrite: NEGATIVE
Protein, ur: NEGATIVE mg/dL
Specific Gravity, Urine: 1.015 (ref 1.005–1.030)
Urobilinogen, UA: 0.2 mg/dL (ref 0.0–1.0)
pH: 5.5 (ref 5.0–8.0)

## 2018-01-29 LAB — POCT GLYCOSYLATED HEMOGLOBIN (HGB A1C): Hemoglobin A1C: 6.2

## 2018-01-29 MED ORDER — LISINOPRIL-HYDROCHLOROTHIAZIDE 20-12.5 MG PO TABS: 1.0000 | ORAL_TABLET | Freq: Every day | ORAL | 3 refills | Status: DC

## 2018-01-29 MED ORDER — SIMVASTATIN 40 MG PO TABS: 40.0000 mg | ORAL_TABLET | Freq: Every day | ORAL | 5 refills | Status: DC

## 2018-01-29 MED ORDER — KETOROLAC TROMETHAMINE 60 MG/2ML IM SOLN
60.0000 mg | Freq: Once | INTRAMUSCULAR | Status: AC
  Administered 2018-01-29: 60 mg via INTRAMUSCULAR

## 2018-01-29 MED ORDER — METFORMIN HCL 500 MG PO TABS: 500.0000 mg | ORAL_TABLET | Freq: Two times a day (BID) | ORAL | 5 refills | Status: DC

## 2018-01-29 MED ORDER — GABAPENTIN 300 MG PO CAPS: 300.0000 mg | ORAL_CAPSULE | Freq: Three times a day (TID) | ORAL | 3 refills | Status: DC

## 2018-01-29 MED FILL — ?SIMVASTATIN 40 MG TABS: 40 | 30 days supply | Qty: 30 | Fill #0

## 2018-01-29 MED FILL — LISINOPRIL-HCTZ 20-12.5 MG: 20-12.5 | 30 days supply | Qty: 30 | Fill #0

## 2018-01-29 MED FILL — ?METFORMIN HCL 500MG TABLET: 500 | 30 days supply | Qty: 60 | Fill #0

## 2018-01-29 MED FILL — GABAPENTIN 300 MG CAPSULE: 300 | 30 days supply | Qty: 90 | Fill #0
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
## 2018-01-29 NOTE — Patient Instructions (Addendum)
Recommend that you complete financial assistance forms.  You will contact community health and wellness to schedule appointment to apply for orange card, please complete oriented cart form prior to appointment.  He will mail [place] assistance from. Your hemoglobin A1c is 6.2, which is at goal.  Will continue metformin 500 mg twice daily with meals.  Will discontinue glyburide 10 mg daily  Will start gabapentin 300 mg three times daily for chronic pain.  Diabetes Mellitus and Exercise Exercising regularly is important for your overall health, especially when you have diabetes (diabetes mellitus). Exercising is not only about losing weight. It has many health benefits, such as increasing muscle strength and bone density and reducing body fat and stress. This leads to improved fitness, flexibility, and endurance, all of which result in better overall health. Exercise has additional benefits for people with diabetes, including:  Reducing appetite.  Helping to lower and control blood glucose.  Lowering blood pressure.  Helping to control amounts of fatty substances (lipids) in the blood, such as cholesterol and triglycerides.  Helping the body to respond better to insulin (improving insulin sensitivity).  Reducing how much insulin the body needs.  Decreasing the risk for heart disease by: ? Lowering cholesterol and triglyceride levels. ? Increasing the levels of good cholesterol. ? Lowering blood glucose levels.  What is my activity plan? Your health care provider or certified diabetes educator can help you make a plan for the type and frequency of exercise (activity plan) that works for you. Make sure that you:  Do at least 150 minutes of moderate-intensity or vigorous-intensity exercise each week. This could be brisk walking, biking, or water aerobics. ? Do stretching and strength exercises, such as yoga or weightlifting, at least 2 times a week. ? Spread out your activity over at  least 3 days of the week.  Get some form of physical activity every day. ? Do not go more than 2 days in a row without some kind of physical activity. ? Avoid being inactive for more than 90 minutes at a time. Take frequent breaks to walk or stretch.  Choose a type of exercise or activity that you enjoy, and set realistic goals.  Start slowly, and gradually increase the intensity of your exercise over time.  What do I need to know about managing my diabetes?  Check your blood glucose before and after exercising. ? If your blood glucose is higher than 240 mg/dL (13.3 mmol/L) before you exercise, check your urine for ketones. If you have ketones in your urine, do not exercise until your blood glucose returns to normal.  Know the symptoms of low blood glucose (hypoglycemia) and how to treat it. Your risk for hypoglycemia increases during and after exercise. Common symptoms of hypoglycemia can include: ? Hunger. ? Anxiety. ? Sweating and feeling clammy. ? Confusion. ? Dizziness or feeling light-headed. ? Increased heart rate or palpitations. ? Blurry vision. ? Tingling or numbness around the mouth, lips, or tongue. ? Tremors or shakes. ? Irritability.  Keep a rapid-acting carbohydrate snack available before, during, and after exercise to help prevent or treat hypoglycemia.  Avoid injecting insulin into areas of the body that are going to be exercised. For example, avoid injecting insulin into: ? The arms, when playing tennis. ? The legs, when jogging.  Keep records of your exercise habits. Doing this can help you and your health care provider adjust your diabetes management plan as needed. Write down: ? Food that you eat before and after  you exercise. ? Blood glucose levels before and after you exercise. ? The type and amount of exercise you have done. ? When your insulin is expected to peak, if you use insulin. Avoid exercising at times when your insulin is peaking.  When you  start a new exercise or activity, work with your health care provider to make sure the activity is safe for you, and to adjust your insulin, medicines, or food intake as needed.  Drink plenty of water while you exercise to prevent dehydration or heat stroke. Drink enough fluid to keep your urine clear or pale yellow. This information is not intended to replace advice given to you by your health care provider. Make sure you discuss any questions you have with your health care provider. Document Released: 03/01/2004 Document Revised: 06/29/2016 Document Reviewed: 05/21/2016 Elsevier Interactive Patient Education  2018 [employer].

## 2018-01-30 LAB — COMPREHENSIVE METABOLIC PANEL
ALT: 18 IU/L (ref 0–44)
AST: 18 IU/L (ref 0–40)
Albumin/Globulin Ratio: 1.6 (ref 1.2–2.2)
Albumin: 4.7 g/dL (ref 3.5–5.5)
Alkaline Phosphatase: 86 IU/L (ref 39–117)
BUN/Creatinine Ratio: 20 (ref 9–20)
BUN: 23 mg/dL (ref 6–24)
Bilirubin Total: 0.4 mg/dL (ref 0.0–1.2)
CO2: 25 mmol/L (ref 20–29)
Calcium: 10.3 mg/dL — ABNORMAL HIGH (ref 8.7–10.2)
Chloride: 100 mmol/L (ref 96–106)
Creatinine, Ser: 1.16 mg/dL (ref 0.76–1.27)
GFR calc Af Amer: 79 mL/min/{1.73_m2} (ref >59)
GFR calc non Af Amer: 69 mL/min/{1.73_m2} (ref >59)
Globulin, Total: 2.9 g/dL (ref 1.5–4.5)
Glucose: 102 mg/dL — ABNORMAL HIGH (ref 65–99)
Potassium: 4.4 mmol/L (ref 3.5–5.2)
Sodium: 140 mmol/L (ref 134–144)
Total Protein: 7.6 g/dL (ref 6.0–8.5)

## 2018-01-30 LAB — LIPID PANEL
Chol/HDL Ratio: 2.4 ratio (ref 0.0–5.0)
Cholesterol, Total: 176 mg/dL (ref 100–199)
HDL: 74 mg/dL (ref >39)
LDL Calculated: 82 mg/dL (ref 0–99)
Triglycerides: 102 mg/dL (ref 0–149)
VLDL Cholesterol Cal: 20 mg/dL (ref 5–40)

## 2018-01-30 LAB — HEPATITIS C ANTIBODY: Hep C Virus Ab: <0.1 s/co ratio (ref 0.0–0.9)

## 2018-02-03 ENCOUNTER — Encounter: Payer: Self-pay | Admitting: Family Medicine

## 2018-02-04 DIAGNOSIS — Z87898 Personal history of other specified conditions: Secondary | ICD-10-CM | POA: Insufficient documentation

## 2018-02-04 DIAGNOSIS — E785 Hyperlipidemia, unspecified: Secondary | ICD-10-CM | POA: Insufficient documentation

## 2018-02-04 DIAGNOSIS — F1491 Cocaine use, unspecified, in remission: Secondary | ICD-10-CM | POA: Insufficient documentation

## 2018-02-04 DIAGNOSIS — G8929 Other chronic pain: Secondary | ICD-10-CM | POA: Insufficient documentation

## 2018-02-17 ENCOUNTER — Ambulatory Visit: Payer: Self-pay | Attending: Internal Medicine

## 2018-02-24 MED FILL — LISINOPRIL-HCTZ 20-12.5 MG: 20-12.5 | 30 days supply | Qty: 30 | Fill #1

## 2018-02-24 MED FILL — GABAPENTIN 300 MG CAPSULE: 300 | 30 days supply | Qty: 90 | Fill #1

## 2018-02-24 MED FILL — SIMVASTATIN 40 MG TABLET: 40 | 30 days supply | Qty: 30 | Fill #1

## 2018-02-24 MED FILL — metFORMIN HCL 500 MG TABS: 500 | 30 days supply | Qty: 60 | Fill #1

## 2018-03-19 ENCOUNTER — Encounter (HOSPITAL_COMMUNITY): Payer: Self-pay | Admitting: Emergency Medicine

## 2018-03-19 DIAGNOSIS — I10 Essential (primary) hypertension: Secondary | ICD-10-CM | POA: Insufficient documentation

## 2018-03-19 DIAGNOSIS — Z7984 Long term (current) use of oral hypoglycemic drugs: Secondary | ICD-10-CM | POA: Insufficient documentation

## 2018-03-19 DIAGNOSIS — Z79899 Other long term (current) drug therapy: Secondary | ICD-10-CM | POA: Insufficient documentation

## 2018-03-19 DIAGNOSIS — E1140 Type 2 diabetes mellitus with diabetic neuropathy, unspecified: Secondary | ICD-10-CM | POA: Insufficient documentation

## 2018-03-19 DIAGNOSIS — R0789 Other chest pain: Secondary | ICD-10-CM | POA: Insufficient documentation
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
## 2018-03-20 ENCOUNTER — Emergency Department (HOSPITAL_COMMUNITY): Payer: Self-pay

## 2018-03-20 ENCOUNTER — Emergency Department (HOSPITAL_COMMUNITY)
Admission: EM | Admit: 2018-03-20 | Discharge: 2018-03-20 | Disposition: A | Discharge status: Home / Self Care | Payer: Self-pay | Attending: Emergency Medicine | Admitting: Emergency Medicine

## 2018-03-20 DIAGNOSIS — R0789 Other chest pain: Secondary | ICD-10-CM

## 2018-03-20 DIAGNOSIS — M1990 Unspecified osteoarthritis, unspecified site: Secondary | ICD-10-CM

## 2018-03-20 DIAGNOSIS — E1142 Type 2 diabetes mellitus with diabetic polyneuropathy: Secondary | ICD-10-CM

## 2018-03-20 LAB — CBC WITH DIFFERENTIAL/PLATELET
Basophils Absolute: 0 10*3/uL (ref 0.0–0.1)
Basophils Relative: 1 %
Eosinophils Absolute: 0.3 10*3/uL (ref 0.0–0.7)
Eosinophils Relative: 5 %
HCT: 36.4 % — ABNORMAL LOW (ref 39.0–52.0)
Hemoglobin: 12.1 g/dL — ABNORMAL LOW (ref 13.0–17.0)
Lymphocytes Relative: 41 %
Lymphs Abs: 2.4 10*3/uL (ref 0.7–4.0)
MCH: 26.7 pg (ref 26.0–34.0)
MCHC: 33.2 g/dL (ref 30.0–36.0)
MCV: 80.4 fL (ref 78.0–100.0)
Monocytes Absolute: 0.4 10*3/uL (ref 0.1–1.0)
Monocytes Relative: 7 %
Neutro Abs: 2.7 10*3/uL (ref 1.7–7.7)
Neutrophils Relative %: 48 %
Platelets: 280 10*3/uL (ref 150–400)
RBC: 4.53 MIL/uL (ref 4.22–5.81)
RDW: 14.4 % (ref 11.5–15.5)
WBC: 5.7 10*3/uL (ref 4.0–10.5)

## 2018-03-20 LAB — BASIC METABOLIC PANEL
Anion gap: 13 (ref 5–15)
BUN: 22 mg/dL — ABNORMAL HIGH (ref 6–20)
CO2: 23 mmol/L (ref 22–32)
Calcium: 9.3 mg/dL (ref 8.9–10.3)
Chloride: 100 mmol/L — ABNORMAL LOW (ref 101–111)
Creatinine, Ser: 1.19 mg/dL (ref 0.61–1.24)
GFR calc Af Amer: >60 mL/min (ref >60)
GFR calc non Af Amer: >60 mL/min (ref >60)
Glucose, Bld: 261 mg/dL — ABNORMAL HIGH (ref 65–99)
Potassium: 4.2 mmol/L (ref 3.5–5.1)
Sodium: 136 mmol/L (ref 135–145)

## 2018-03-20 LAB — CBG MONITORING, ED: Glucose-Capillary: 252 mg/dL — ABNORMAL HIGH (ref 65–99)

## 2018-03-20 LAB — I-STAT CHEM 8, ED
BUN: 23 mg/dL — ABNORMAL HIGH (ref 6–20)
Calcium, Ion: 1.21 mmol/L (ref 1.15–1.40)
Chloride: 99 mmol/L — ABNORMAL LOW (ref 101–111)
Creatinine, Ser: 1.2 mg/dL (ref 0.61–1.24)
Glucose, Bld: 257 mg/dL — ABNORMAL HIGH (ref 65–99)
HCT: 37 % — ABNORMAL LOW (ref 39.0–52.0)
Hemoglobin: 12.6 g/dL — ABNORMAL LOW (ref 13.0–17.0)
Potassium: 4.1 mmol/L (ref 3.5–5.1)
Sodium: 138 mmol/L (ref 135–145)
TCO2: 28 mmol/L (ref 22–32)

## 2018-03-20 LAB — I-STAT TROPONIN, ED: Troponin i, poc: 0.01 ng/mL (ref 0.00–0.08)

## 2018-03-20 MED ORDER — KETOROLAC TROMETHAMINE 30 MG/ML IJ SOLN
60.0000 mg | Freq: Once | INTRAMUSCULAR | Status: AC
  Administered 2018-03-20: 60 mg via INTRAMUSCULAR
  Filled 2018-03-20: qty 2

## 2018-03-20 NOTE — Discharge Instructions (Signed)
You may alternate between Tylenol 1000 mg every 6 hours as needed for pain and Aleve 500 mg every 12 hours as needed for pain.  Please follow-up closely with your primary care physician.  Please continue your Neurontin.

## 2018-03-20 NOTE — ED Notes (Signed)
Pt remains in triage waiting room.  Updated on wait for treatment room.
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
## 2018-03-25 MED FILL — LISINOPRIL-HCTZ 20-12.5 MG: 20-12.5 | 30 days supply | Qty: 30 | Fill #2

## 2018-03-25 MED FILL — metFORMIN HCL 500 MG TABS: 500 | 30 days supply | Qty: 60 | Fill #2

## 2018-03-25 MED FILL — GABAPENTIN 300 MG CAPSULE: 300 | 30 days supply | Qty: 90 | Fill #2

## 2018-03-25 MED FILL — SIMVASTATIN 40 MG TABLET: 40 | 30 days supply | Qty: 30 | Fill #2

## 2018-04-17 ENCOUNTER — Encounter (HOSPITAL_COMMUNITY): Payer: Self-pay | Admitting: *Deleted

## 2018-04-17 ENCOUNTER — Other Ambulatory Visit: Payer: Self-pay

## 2018-04-17 ENCOUNTER — Emergency Department (HOSPITAL_COMMUNITY)
Admission: EM | Admit: 2018-04-17 | Discharge: 2018-04-17 | Disposition: A | Discharge status: Home / Self Care | Payer: Self-pay | Attending: Emergency Medicine | Admitting: Emergency Medicine

## 2018-04-17 DIAGNOSIS — R739 Hyperglycemia, unspecified: Secondary | ICD-10-CM

## 2018-04-17 DIAGNOSIS — Z79899 Other long term (current) drug therapy: Secondary | ICD-10-CM | POA: Insufficient documentation

## 2018-04-17 DIAGNOSIS — Z7984 Long term (current) use of oral hypoglycemic drugs: Secondary | ICD-10-CM | POA: Insufficient documentation

## 2018-04-17 DIAGNOSIS — E1165 Type 2 diabetes mellitus with hyperglycemia: Secondary | ICD-10-CM | POA: Insufficient documentation

## 2018-04-17 DIAGNOSIS — I10 Essential (primary) hypertension: Secondary | ICD-10-CM | POA: Insufficient documentation

## 2018-04-17 DIAGNOSIS — E1149 Type 2 diabetes mellitus with other diabetic neurological complication: Secondary | ICD-10-CM | POA: Insufficient documentation

## 2018-04-17 DIAGNOSIS — Z85038 Personal history of other malignant neoplasm of large intestine: Secondary | ICD-10-CM | POA: Insufficient documentation

## 2018-04-17 LAB — CBG MONITORING, ED: Glucose-Capillary: 387 mg/dL — ABNORMAL HIGH (ref 65–99)

## 2018-04-17 MED ORDER — KETOROLAC TROMETHAMINE 60 MG/2ML IM SOLN
60.0000 mg | Freq: Once | INTRAMUSCULAR | Status: AC
  Administered 2018-04-17: 60 mg via INTRAMUSCULAR
  Filled 2018-04-17: qty 2

## 2018-04-17 NOTE — ED Triage Notes (Signed)
Pt reports ongoing pain to bilateral posterior legs and left elbow. Hx of same but unable to wait until pcp appt. Denies any injury.

## 2018-04-17 NOTE — ED Provider Notes (Signed)
[facility] EMERGENCY DEPARTMENT Provider Note   CSN: [id number] Arrival date & time: 04/17/18  8144     History   Chief Complaint Chief Complaint  Patient presents with  . Arm Pain  . Leg Pain    HPI Kyle Merritt is a 57 y.o. male.  The history is provided by the patient. No language interpreter was used.  Arm Pain  This is a new problem. Episode onset: months. The problem has not changed since onset.Nothing aggravates the symptoms. Nothing relieves the symptoms. He has tried nothing for the symptoms. The treatment provided no relief.  Leg Pain    Pt has chronic pain in arms and legs.  Pt has diabetes.  He reports no relief from gabapentin   Past Medical History:  Diagnosis Date  . Colon cancer ([place])   . Diabetes mellitus without complication ([place])   . High cholesterol   . History of colon cancer   . Hypertension     Patient Active Problem List   Diagnosis Date Noted  . Other chronic pain 02/04/2018  . History of cocaine use 02/04/2018  . Hyperlipidemia 02/04/2018  . Colon cancer screening 11/29/2014  . Knee pain, acute 10/04/2014  . Essential hypertension 08/02/2014  . Dental abscess 08/02/2014  . Diabetes ([place]) 04/29/2014  . HTN (hypertension) 04/29/2014  . Dyslipidemia 04/29/2014  . History of colon cancer 04/29/2014  . Back pain 04/29/2014  . Erectile dysfunction 04/29/2014  . PURE HYPERCHOLESTEROLEMIA 12/31/2008  . TESTOSTERONE DEFICIENCY 12/10/2008  . GERD 12/08/2008  . ONYCHOMYCOSIS, BILATERAL 11/09/2008  . DIABETES MELLITUS 11/09/2008  . COLON CANCER 12/24/2002    Past Surgical History:  Procedure Laterality Date  . COLON SURGERY          Home Medications    Prior to Admission medications   Medication Sig Start Date End Date Taking? Authorizing Provider  gabapentin (NEURONTIN) 300 MG capsule Take 1 capsule (300 mg total) by mouth 3 (three) times daily. 01/29/18   Dorena Dew, FNP  lisinopril-hydrochlorothiazide  (PRINZIDE,ZESTORETIC) 20-12.5 MG tablet Take 1 tablet by mouth daily. 01/29/18   Dorena Dew, FNP  metFORMIN (GLUCOPHAGE) 500 MG tablet Take 1 tablet (500 mg total) by mouth 2 (two) times daily with a meal. 01/29/18   Dorena Dew, FNP  simvastatin (ZOCOR) 40 MG tablet Take 1 tablet (40 mg total) by mouth daily. 01/29/18   Dorena Dew, FNP    Family History Family History  Problem Relation Age of Onset  . Diabetes Mother   . Hypertension Mother     Social History Social History   Tobacco Use  . Smoking status: Never Smoker  . Smokeless tobacco: Never Used  Substance Use Topics  . Alcohol use: No  . Drug use: No     Allergies   Patient has no known allergies.   Review of Systems Review of Systems  All other systems reviewed and are negative.    Physical Exam Updated Vital Signs BP (!) 165/98 (BP Location: Right Arm)   Pulse 81   Temp 98 F (36.7 C) (Oral)   Resp 14   SpO2 98%   Physical Exam  Constitutional: He is oriented to person, place, and time. He appears well-developed and well-nourished.  HENT:  Head: Normocephalic.  Eyes: EOM are normal.  Neck: Normal range of motion.  Cardiovascular: Normal rate.  Pulmonary/Chest: Effort normal.  Abdominal: He exhibits no distension.  Musculoskeletal: Normal range of motion. He exhibits tenderness.  Tender bilat  knees, tender bilat ankles,  Pain with range of motion   Neurological: He is alert and oriented to person, place, and time.  Skin: Skin is warm.  Psychiatric: He has a normal mood and affect.  Nursing note and vitals reviewed.    ED Treatments / Results  Labs (all labs ordered are listed, but only abnormal results are displayed) Labs Reviewed  CBG MONITORING, ED - Abnormal; Notable for the following components:      Result Value   Glucose-Capillary 387 (*)    All other components within normal limits    EKG None  Radiology No results found.  Procedures Procedures (including  critical care time)  Medications Ordered in ED Medications  ketorolac (TORADOL) injection 60 mg (60 mg Intramuscular Given 04/17/18 1043)     Initial Impression / Assessment and Plan / ED Course  I have reviewed the triage vital signs and the nursing notes.  Pertinent labs & imaging results that were available during my care of the patient were reviewed by me and considered in my medical decision making (see chart for details).    Pt counseled on neuropathy.  Pt is requesting torodol shot.  Pt advised to follow up with his md   Final Clinical Impressions(s) / ED Diagnoses   Final diagnoses:  Hyperglycemia  Other diabetic neurological complication associated with type 2 diabetes mellitus [facility])    ED Discharge Orders    None    An After Visit Summary was printed and given to the patient.    Sidney Ace 04/17/18 1623    Hayden Rasmussen, MD 04/19/18 [phone]

## 2018-04-17 NOTE — Discharge Instructions (Signed)
Return if any problems.

## 2018-04-28 ENCOUNTER — Ambulatory Visit (INDEPENDENT_AMBULATORY_CARE_PROVIDER_SITE_OTHER): Payer: Self-pay | Admitting: Family Medicine

## 2018-04-28 ENCOUNTER — Encounter: Payer: Self-pay | Admitting: Family Medicine

## 2018-04-28 VITALS — BP 132/82 | HR 78 | Temp 98.0°F | Resp 14 | Ht 75.0 in | Wt 239.0 lb

## 2018-04-28 DIAGNOSIS — E119 Type 2 diabetes mellitus without complications: Secondary | ICD-10-CM

## 2018-04-28 DIAGNOSIS — N529 Male erectile dysfunction, unspecified: Secondary | ICD-10-CM

## 2018-04-28 DIAGNOSIS — G8929 Other chronic pain: Secondary | ICD-10-CM

## 2018-04-28 DIAGNOSIS — M25512 Pain in left shoulder: Secondary | ICD-10-CM

## 2018-04-28 DIAGNOSIS — I10 Essential (primary) hypertension: Secondary | ICD-10-CM

## 2018-04-28 LAB — POCT URINALYSIS DIPSTICK
Bilirubin, UA: NEGATIVE
Glucose, UA: 500
Ketones, UA: NEGATIVE
Leukocytes, UA: NEGATIVE
Nitrite, UA: NEGATIVE
Protein, UA: NEGATIVE
Spec Grav, UA: 1.015 (ref 1.010–1.025)
Urobilinogen, UA: 0.2 E.U./dL
pH, UA: 5 (ref 5.0–8.0)

## 2018-04-28 LAB — POCT GLYCOSYLATED HEMOGLOBIN (HGB A1C): Hemoglobin A1C: 12.2

## 2018-04-28 MED ORDER — SILDENAFIL CITRATE 25 MG PO TABS: 25.0000 mg | ORAL_TABLET | ORAL | 0 refills | Status: DC | PRN

## 2018-04-28 MED ORDER — GABAPENTIN 300 MG PO CAPS
300.0000 mg | ORAL_CAPSULE | Freq: Four times a day (QID) | ORAL | 3 refills | Status: DC
Start: 2018-04-28 — End: 2018-07-30

## 2018-04-28 MED ORDER — MELOXICAM 7.5 MG PO TABS: 7.5000 mg | ORAL_TABLET | Freq: Every day | ORAL | 0 refills | Status: DC

## 2018-04-28 MED ORDER — SITAGLIPTIN PHOSPHATE 50 MG PO TABS
50.0000 mg | ORAL_TABLET | Freq: Two times a day (BID) | ORAL | 1 refills | Status: DC
Start: 2018-04-28 — End: 2018-07-02

## 2018-04-28 MED ORDER — METFORMIN HCL 1000 MG PO TABS: 1000.0000 mg | ORAL_TABLET | Freq: Two times a day (BID) | ORAL | 1 refills | Status: DC

## 2018-04-28 MED ORDER — LISINOPRIL-HYDROCHLOROTHIAZIDE 20-12.5 MG PO TABS: 1.0000 | ORAL_TABLET | Freq: Every day | ORAL | 3 refills | Status: DC

## 2018-04-28 MED ORDER — KETOROLAC TROMETHAMINE 60 MG/2ML IM SOLN
60.0000 mg | Freq: Once | INTRAMUSCULAR | Status: AC
  Administered 2018-04-28: 60 mg via INTRAMUSCULAR

## 2018-04-28 MED FILL — metFORMIN HCL 1000 MG TABS: 1000 | 30 days supply | Qty: 60 | Fill #0

## 2018-04-28 MED FILL — !VIAGRA 25 MG TABLET: 25 | 30 days supply | Qty: 10 | Fill #0

## 2018-04-28 MED FILL — GABAPENTIN 300 MG CAPSULE: 300 | 30 days supply | Qty: 120 | Fill #0

## 2018-04-28 MED FILL — LISINOPRIL-HCTZ 20-12.5 MG: 20-12.5 | 30 days supply | Qty: 30 | Fill #0

## 2018-04-28 MED FILL — MELOXICAM 7.5 MG TABLET: 7.5 | 30 days supply | Qty: 30 | Fill #0
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
## 2018-04-28 NOTE — Patient Instructions (Signed)
Your hemoglobin A1c has increased to 12.2.  Will increase metformin to 1000 mg twice daily and add Januvia 50 mg twice daily.  Also, recommend carbohydrate modify diet divided over 5-6 small meals throughout the day.  Discussed the fact that you need to decrease the amount of white, refined sugar including bread, grits, rice, pasta, etc.  The patient desires Viagra to treat his erectile dysfunction. History and physical exam has not disclosed any obvious treatable cause of this complaint. He is informed that Viagra is sometimes not covered by insurance. It is available on a fee-for-service cost basis, and is relatively expensive.  The method of use 1 hour prior to anticipated intercourse is explained. He should not use any more than one tablet in a 24 hour period. The side effects of possible headache, flushing, dyspepsia and transient changes in vision have been explained.   The patient is not taking nitrates, and denies he has access to nitrates in any form at any time. I have counseled him that taking Viagra with nitrates of any form can cause death. Additionally, Viagra serum concentrations can be increased by the following: cimetidine, erythromycin, itraconazole or ketoconazole. This patient does not take these drugs, but I have counseled him to avoid Viagra if he does take any of these.  We have also discussed the fact that there have been some deaths in patients after taking Viagra, felt due to the exertion of intercourse rather than the drug itself. The patient is aware of this, and accepts whatever unknown degree of risk there is in this aspect.

## 2018-04-29 LAB — COMPREHENSIVE METABOLIC PANEL
ALT: 30 IU/L (ref 0–44)
AST: 16 IU/L (ref 0–40)
Albumin/Globulin Ratio: 1.6 (ref 1.2–2.2)
Albumin: 4.4 g/dL (ref 3.5–5.5)
Alkaline Phosphatase: 115 IU/L (ref 39–117)
BUN/Creatinine Ratio: 15 (ref 9–20)
BUN: 21 mg/dL (ref 6–24)
Bilirubin Total: 0.6 mg/dL (ref 0.0–1.2)
CO2: 22 mmol/L (ref 20–29)
Calcium: 9.7 mg/dL (ref 8.7–10.2)
Chloride: 97 mmol/L (ref 96–106)
Creatinine, Ser: 1.38 mg/dL — ABNORMAL HIGH (ref 0.76–1.27)
GFR calc Af Amer: 64 mL/min/{1.73_m2} (ref >59)
GFR calc non Af Amer: 56 mL/min/{1.73_m2} — ABNORMAL LOW (ref >59)
Globulin, Total: 2.7 g/dL (ref 1.5–4.5)
Glucose: 362 mg/dL — ABNORMAL HIGH (ref 65–99)
Potassium: 4.8 mmol/L (ref 3.5–5.2)
Sodium: 138 mmol/L (ref 134–144)
Total Protein: 7.1 g/dL (ref 6.0–8.5)

## 2018-04-29 LAB — ARTHRITIS PANEL
Anti Nuclear Antibody(ANA): NEGATIVE
Rhuematoid fact SerPl-aCnc: <10 IU/mL (ref 0.0–13.9)
Sed Rate: 17 mm/hr (ref 0–30)
Uric Acid: 6.5 mg/dL (ref 3.7–8.6)

## 2018-04-30 ENCOUNTER — Telehealth: Payer: Self-pay

## 2018-04-30 NOTE — Telephone Encounter (Signed)
-----   Message from Dorena Dew, [place] sent at 04/29/2018 10:05 AM EDT ----- Regarding: lab results Please inform patient that creatinine is elevated and GFR is decreased which is consistent with stage 2 chronic kidney disease. Remind him of the importance of taking antidiabetic and antihypertensive medications as prescribed and a carbohydrate modified diet in order to achieve positive outcomes. Will continue to monitor closely. Arthritis panel was unremarkable, will continue to treat chronic pain conservatively. Gabapentin was increased to 300 mg 4 times per day and Meloxicam 7.5 mg was added to medication regimen.   Also, advise patient to follow up in office as scheduled.   Donia Pounds  MSN, FNP-C [facility] [address] [place], [place] [address] [phone]

## 2018-04-30 NOTE — Telephone Encounter (Signed)
Called, patient not available. Left a message for patient to call back. Thanks!

## 2018-05-05 ENCOUNTER — Ambulatory Visit: Payer: Self-pay | Admitting: Sports Medicine

## 2018-05-06 ENCOUNTER — Ambulatory Visit: Payer: Medicaid Other | Admitting: Family Medicine

## 2018-05-07 ENCOUNTER — Ambulatory Visit (INDEPENDENT_AMBULATORY_CARE_PROVIDER_SITE_OTHER): Payer: Self-pay | Admitting: Family Medicine

## 2018-05-07 VITALS — BP 150/90 | Ht 71.0 in | Wt 240.0 lb

## 2018-05-07 DIAGNOSIS — M5010 Cervical disc disorder with radiculopathy, unspecified cervical region: Secondary | ICD-10-CM

## 2018-05-07 MED ORDER — PREDNISONE 10 MG PO TABS
ORAL_TABLET | ORAL | 0 refills | Status: DC
Start: 2018-05-07 — End: 2018-07-02

## 2018-05-07 MED FILL — SIMVASTATIN 40 MG TABLET: 40 | 30 days supply | Qty: 30 | Fill #3

## 2018-05-07 MED FILL — predniSONE 10 MG TABS: 10 | 6 days supply | Qty: 21 | Fill #0

## 2018-05-07 NOTE — Patient Instructions (Signed)
You have cervical radiculopathy (a pinched nerve in the neck) causing pain in your arm. Prednisone 6 day dose pack to relieve irritation/inflammation of the nerve. Stop meloxicam but it's ok for you to take your gabapentin with this. The day AFTER you finish prednisone you can take aleve or ibuprofen. Simple range of motion exercises within limits of pain to prevent further stiffness. Start physical therapy for stretching, exercises, traction, and modalities. Heat 15 minutes at a time 3-4 times a day to help with spasms. Watch head position when on computers, texting, when sleeping in bed - should in line with back to prevent further nerve traction and irritation. Consider home traction unit if you get benefit with this in physical therapy. If not improving we will consider an MRI. Follow up with me in 4 weeks if you're doing well, 2 weeks if you're still struggling despite the medicine and therapy.

## 2018-05-08 ENCOUNTER — Encounter: Payer: Self-pay | Admitting: Family Medicine

## 2018-05-08 DIAGNOSIS — M5010 Cervical disc disorder with radiculopathy, unspecified cervical region: Secondary | ICD-10-CM | POA: Insufficient documentation

## 2018-05-08 NOTE — Assessment & Plan Note (Signed)
consistent with radiculopathy more into the right side.  Start prednisone dose pack with transition to aleve or ibuprofen.  Continue gabapentin.  Start physical therapy.  Heat for spasms.  Ergonomic issues discussed.  F/u in 4 weeks.

## 2018-05-08 NOTE — Progress Notes (Signed)
PCP: Dorena Dew, FNP  Subjective:   HPI: Patient is a 57 y.o. male here for bilateral shoulder pain.  Patient reports about 4-5 months ago he started to get pain in his left wrist that radiated up his arm. Has progressed to include pain in right moreso than left shoulders anteriorly. Right side associated with numbness into right index and middle digits. Moving neck to the left side makes his right arm hurt. Has tried gabapentin. Had an arthritis panel that was negative. He drives trucks for work. Difficulty sleeping due to pain - 9/10 level with throbbing. No bowel/bladder dysfunction.  Past Medical History:  Diagnosis Date  . Colon cancer ([place])   . Diabetes mellitus without complication ([place])   . High cholesterol   . History of colon cancer   . Hypertension     Current Outpatient Medications on File Prior to Visit  Medication Sig Dispense Refill  . gabapentin (NEURONTIN) 300 MG capsule Take 1 capsule (300 mg total) by mouth 4 (four) times daily. 120 capsule 3  . lisinopril-hydrochlorothiazide (PRINZIDE,ZESTORETIC) 20-12.5 MG tablet Take 1 tablet by mouth daily. 90 tablet 3  . meloxicam (MOBIC) 7.5 MG tablet Take 1 tablet (7.5 mg total) by mouth daily. 30 tablet 0  . metFORMIN (GLUCOPHAGE) 1000 MG tablet Take 1 tablet (1,000 mg total) by mouth 2 (two) times daily with a meal. 180 tablet 1  . sildenafil (VIAGRA) 25 MG tablet Take 1 tablet (25 mg total) by mouth as needed for erectile dysfunction. 10 tablet 0  . simvastatin (ZOCOR) 40 MG tablet Take 1 tablet (40 mg total) by mouth daily. (Patient not taking: Reported on 05/07/2018) 30 tablet 5  . sitaGLIPtin (JANUVIA) 50 MG tablet Take 1 tablet (50 mg total) by mouth 2 (two) times daily. 90 tablet 1   No current facility-administered medications on file prior to visit.     Past Surgical History:  Procedure Laterality Date  . COLON SURGERY      No Known Allergies  Social History   Socioeconomic History  . Marital  status: Divorced    Spouse name: Not on file  . Number of children: Not on file  . Years of education: Not on file  . Highest education level: Not on file  Occupational History  . Not on file  Social Needs  . Financial resource strain: Not on file  . Food insecurity:    Worry: Not on file    Inability: Not on file  . Transportation needs:    Medical: Not on file    Non-medical: Not on file  Tobacco Use  . Smoking status: Never Smoker  . Smokeless tobacco: Never Used  Substance and Sexual Activity  . Alcohol use: No  . Drug use: No  . Sexual activity: Yes  Lifestyle  . Physical activity:    Days per week: Not on file    Minutes per session: Not on file  . Stress: Not on file  Relationships  . Social connections:    Talks on phone: Not on file    Gets together: Not on file    Attends religious service: Not on file    Active member of club or organization: Not on file    Attends meetings of clubs or organizations: Not on file    Relationship status: Not on file  . Intimate partner violence:    Fear of current or ex partner: Not on file    Emotionally abused: Not on file  Physically abused: Not on file    Forced sexual activity: Not on file  Other Topics Concern  . Not on file  Social History Narrative  . Not on file    Family History  Problem Relation Age of Onset  . Diabetes Mother   . Hypertension Mother     BP (!) 150/90   Ht 5\' 11"  (1.803 m)   Wt 240 lb (108.9 kg)   BMI 33.47 kg/m   Review of Systems: See HPI above.     Objective:  Physical Exam:  Gen: NAD, comfortable in exam room  Neck: No gross deformity, swelling, bruising. TTP bilateral paraspinal cervical regions.  No midline/bony TTP. FROM with pain on lateral rotations. BUE strength 5/5.   Sensation intact to light touch currently.   1+ equal reflexes in triceps, biceps, brachioradialis tendons. NV intact distal BUEs.  Bilateral shoulders: No swelling, ecchymoses.  No gross  deformity. No TTP. FROM. Negative Hawkins, Neers. Negative Yergasons. Strength 5/5 with empty can and resisted internal/external rotation. Negative apprehension. NV intact distally.   Assessment & Plan:  1. Neck pain with radiation into extremities - consistent with radiculopathy more into the right side.  Start prednisone dose pack with transition to aleve or ibuprofen.  Continue gabapentin.  Start physical therapy.  Heat for spasms.  Ergonomic issues discussed.  F/u in 4 weeks.

## 2018-05-12 ENCOUNTER — Ambulatory Visit: Payer: Self-pay | Attending: Family Medicine | Admitting: Physical Therapy

## 2018-05-12 ENCOUNTER — Encounter: Payer: Self-pay | Admitting: Physical Therapy

## 2018-05-12 ENCOUNTER — Other Ambulatory Visit: Payer: Self-pay

## 2018-05-12 DIAGNOSIS — R293 Abnormal posture: Secondary | ICD-10-CM | POA: Insufficient documentation

## 2018-05-12 DIAGNOSIS — M542 Cervicalgia: Secondary | ICD-10-CM | POA: Insufficient documentation

## 2018-05-12 DIAGNOSIS — R208 Other disturbances of skin sensation: Secondary | ICD-10-CM | POA: Insufficient documentation

## 2018-05-12 NOTE — Patient Instructions (Signed)
   RETRACTION / CHIN TUCK  Slowly draw your head back so that your ears line up with your shoulders.  Repeat 10 times, twice a day (morning and evening)    SCAPULAR RETRACTIONS  Draw your shoulder blades back and down.  It should feel like  You are squeezing your shoulder blades together.  Repeat 10-15 times, twice a day (morning and evening)    TRICEP STRETCH  With your affected elbow bent and shoulder raised, use your other hand and gentley push your affected elbow back towards over head until a stretch is felt.  Hold for 30 seconds, then relax.  Repeat 3 times, twice a day (morning and evening)     SHOULDER ROLLS  Move your shoulders in a circular pattern as shown so that your are moving in an up, back and down direction. Perform small cicles if needed for comfort.  Repeat 10-15 times, twice a day (morning and evening)

## 2018-05-12 NOTE — Therapy (Signed)
[place], [place], [address] Phone: [phone]   Fax:  [phone]  Physical Therapy Evaluation  Patient Details  Name: Kyle Merritt MRN: [id number] Date of Birth: [birth year] Referring Provider: Blase Mess    Encounter Date: 05/12/2018  PT End of Session - 05/12/18 1217    Visit Number  1    Number of Visits  13    Date for PT Re-Evaluation  06/02/18    Authorization Type  CAFA expires 08/17/18    Authorization Time Period  05/12/18 to 06/23/18    PT Start Time  1017    PT Stop Time  1055    PT Time Calculation (min)  38 min    Activity Tolerance  Patient tolerated treatment well    Behavior During Therapy  [facility] for tasks assessed/performed       Past Medical History:  Diagnosis Date  . Colon cancer ([place])   . Diabetes mellitus without complication ([place])   . High cholesterol   . History of colon cancer   . Hypertension     Past Surgical History:  Procedure Laterality Date  . COLON SURGERY      There were no vitals filed for this visit.   Subjective Assessment - 05/12/18 1019    Subjective  My neck pain started about 4-5 months ago, most of my pain is in my arm and my doctor says its from a pinched nerve in my neck. I have to sleep with my arm across my head.  It is pain in my arm and tingling in my fingerips in my 2 middle fingers, sometimes all 5 fingers. I have had issues with holding the steering wheel and holding cups of water.     How long can you sit comfortably?  have to move arm around often     How long can you stand comfortably?  have to move arm around often     How long can you walk comfortably?  have to move arm around often     Patient Stated Goals  get rid of symptoms, be pain free so I can get back to work     Currently in Pain?  Yes 10/10 at worst     Pain Score  2     Pain Location  Arm    Pain Orientation  Left    Pain Descriptors / Indicators  Shooting;Numbness;Other  (Comment) kind of a fizzing sensation     Pain Type  Chronic pain    Pain Radiating Towards  down L UE to fingertips     Pain Onset  More than a month ago    Pain Frequency  Constant    Aggravating Factors   moving head to R, moving elbow     Pain Relieving Factors  medicine (prednisone)    Effect of Pain on Daily Activities  severe         [facility] PT Assessment - 05/12/18 0001      Assessment   Medical Diagnosis  neck pain     Referring Provider  Shane Hudnell     Onset Date/Surgical Date  -- 4-5 months ago     Hand Dominance  Left    Next MD Visit  Dr. Felipe Drone in June     Prior Therapy  none       Precautions   Precautions  None      Restrictions   Weight Bearing Restrictions  No  Balance Screen   Has the patient fallen in the past 6 months  No    Has the patient had a decrease in activity level because of a fear of falling?   No    Is the patient reluctant to leave their home because of a fear of falling?   No      Prior Function   Level of Independence  Independent with gait;Independent with transfers;Independent;Independent with basic ADLs    Vocation  Full time employment    Vocation Requirements  truck driver     Leisure  hanging out, going to movies, walking/jogging       Observation/Other Assessments   Observations  pain improves with cervical traction, worsens with cervical compression       Sensation   Light Touch  Impaired by gross assessment    Additional Comments  L UE grossly impaired dermatomes C6-T1       ROM / Strength   AROM / PROM / Strength  AROM;Strength      AROM   AROM Assessment Site  Shoulder;Cervical;Thoracic    Right Shoulder Flexion  -- full ROM but painful     Right Shoulder ABduction  -- full ROM but painful     Right Shoulder Internal Rotation  -- L4     Right Shoulder External Rotation  -- C6    Left Shoulder Flexion  -- full ROM but painful     Left Shoulder ABduction  -- full ROM but painful     Left Shoulder Internal  Rotation  -- L4     Left Shoulder External Rotation  -- C6    Cervical Flexion  38    Cervical Extension  30    Cervical - Right Side Bend  45    Cervical - Left Side Bend  45    Cervical - Right Rotation  90    Cervical - Left Rotation  65    Thoracic Flexion  full ROM     Thoracic Extension  mild limitation     Thoracic - Right Side Bend  full ROM     Thoracic - Left Side Bend  full ROM     Thoracic - Right Rotation  full ROM but painful     Thoracic - Left Rotation  mild limitatoin       Strength   Overall Strength Comments  mild strength loss B hands for grip     Strength Assessment Site  Shoulder;Cervical    Right/Left Shoulder  Right;Left    Right Shoulder Flexion  5/5    Right Shoulder ABduction  5/5    Right Shoulder Internal Rotation  4/5    Right Shoulder External Rotation  4/5    Left Shoulder Flexion  5/5    Left Shoulder Extension  5/5    Left Shoulder Internal Rotation  5/5    Left Shoulder External Rotation  5/5    Right/Left Elbow  Right;Left    Right Elbow Flexion  4+/5    Right Elbow Extension  3+/5    Left Elbow Flexion  5/5    Left Elbow Extension  3+/5                Objective measurements completed on examination: See above findings.      [facility] Adult PT Treatment/Exercise - 05/12/18 0001      Exercises   Exercises  Neck      Neck Exercises: Seated   Cervical Rotation  10  reps    Shoulder Rolls  10 reps;Backwards    Other Seated Exercise  triceps stretch 2x30 seconds L UE     Other Seated Exercise  backwards shoulder rolls 1x10 up back down              PT Education - 05/12/18 1217    Education provided  Yes    Education Details  exam findings, POC, prognosis, HEP    Person(s) Educated  Patient    Methods  Explanation;Handout;Demonstration    Comprehension  Verbalized understanding;Returned demonstration;Need further instruction;Verbal cues required       PT Short Term Goals - 05/12/18 1220      PT SHORT TERM GOAL #1    Title  Patient to demonstrate full cervical and thoracic ROM on all planes in order to show improved mobility and mechanics     Time  3    Period  Weeks    Status  New    Target Date  06/02/18      PT SHORT TERM GOAL #2   Title  Patient to demonstrate functional B shoulder ER to at least T4 and B shoulder to at least T10 in order to show improved functional shoulder ROM     Time  3    Period  Weeks    Status  New      PT SHORT TERM GOAL #3   Title  Patient to report pain as being no more than 4/10 at worst in order to improve QOL and functional task tolerance     Time  3    Period  Weeks    Status  New      PT SHORT TERM GOAL #4   Title  Patient to be able to maintain correct functional posture with external cues no more than 25% of the time in order to reduce pain and restore functional mechanics and movement patterns     Time  3    Period  Weeks    Status  New        PT Long Term Goals - 05/12/18 1222      PT LONG TERM GOAL #1   Title  Patient to demonstrate functional strength as being 5/5 in all tested muscle groups in order to assist in reducing pain and restoring functional peformance     Time  6    Period  Weeks    Status  New    Target Date  06/23/18      PT LONG TERM GOAL #2   Title  Patient to be able to perform overhead tasks such as changing a lightbulb or placing a 10# item on an overhead shelf without pain exacerbation in order to improve functional task performance     Time  6    Period  Weeks    Status  New      PT LONG TERM GOAL #3   Title  Patient to be able to perform functional tasks such as dressing and bathing without pain exacerbation in order to show general improvement in condition     Time  6    Period  Weeks    Status  New      PT LONG TERM GOAL #4   Title  Patient to have returned to work without pain exacerbation in order to show general resolution of symptoms and return to regular activities     Time  6    Period  Weeks  Status   New             Plan - 05/12/18 1218    Clinical Impression Statement  Patient arrives with symptoms present for about the past 4-5 months, reporting that his main pain/issues are in his arm and that his MD has told him that he has a pinched nerve in his neck. Examination reveals chronic postural deficits, postural weakness, impaired light touch sensation, shoulder/cervical/thoracic stiffness, and mild functional muscle weakness. He responds well to cervical traction and poorly to cervical compression as would be expected, and has a presentation consistent with that of a pinched cervical nerve. He will benefit from skilled PT services to address functional limitations and to reduce pain moving forward.     Clinical Presentation  Stable    Clinical Decision Making  Low    Rehab Potential  Good    PT Frequency  2x / week    PT Duration  6 weeks    PT Treatment/Interventions  ADLs/Self Care Home Management;Biofeedback;Cryotherapy;Electrical Stimulation;Iontophoresis 4mg /ml Dexamethasone;Moist Heat;Traction;Ultrasound;DME Instruction;Functional mobility training;Therapeutic activities;Therapeutic exercise;Balance training;Neuromuscular re-education;Patient/family education;Manual techniques;Passive range of motion;Dry needling;Taping    PT Next Visit Plan  review HEP and goals; focus on postural training, cervical and shoulder ROM and strength, manual cervical traction     PT Home Exercise Plan  Eval: chin tucks, scap retractions, shoulder rolls, triceps stretch     Consulted and Agree with Plan of Care  Patient       Patient will benefit from skilled therapeutic intervention in order to improve the following deficits and impairments:  Increased fascial restricitons, Improper body mechanics, Pain, Impaired sensation, Decreased coordination, Increased muscle spasms, Postural dysfunction, Decreased range of motion, Decreased strength, Hypomobility, Impaired UE functional use, Impaired  flexibility  Visit Diagnosis: Cervicalgia - Plan: PT plan of care cert/re-cert  Abnormal posture - Plan: PT plan of care cert/re-cert  Other disturbances of skin sensation - Plan: PT plan of care cert/re-cert     Problem List Patient Active Problem List   Diagnosis Date Noted  . Cervical disc disorder with radiculopathy of cervical region 05/08/2018  . Other chronic pain 02/04/2018  . History of cocaine use 02/04/2018  . Hyperlipidemia 02/04/2018  . Colon cancer screening 11/29/2014  . Knee pain, acute 10/04/2014  . Essential hypertension 08/02/2014  . Dental abscess 08/02/2014  . Diabetes ([place]) 04/29/2014  . HTN (hypertension) 04/29/2014  . Dyslipidemia 04/29/2014  . History of colon cancer 04/29/2014  . Back pain 04/29/2014  . Erectile dysfunction 04/29/2014  . Tobacco abuse 07/27/2013  . PURE HYPERCHOLESTEROLEMIA 12/31/2008  . TESTOSTERONE DEFICIENCY 12/10/2008  . GERD 12/08/2008  . ONYCHOMYCOSIS, BILATERAL 11/09/2008  . DIABETES MELLITUS 11/09/2008  . COLON CANCER 12/24/2002    Deniece Ree PT, DPT, CBIS  Supplemental Physical Therapist [place]   Pager [facility] [address] [place], [place], [address] Phone: [phone]   Fax:  [phone]  Name: Raphael Fitzpatrick MRN: [id number] Date of Birth: May 28, 1961

## 2018-05-25 ENCOUNTER — Encounter (HOSPITAL_COMMUNITY): Payer: Self-pay | Admitting: Emergency Medicine

## 2018-05-25 ENCOUNTER — Other Ambulatory Visit: Payer: Self-pay

## 2018-05-25 ENCOUNTER — Emergency Department (HOSPITAL_COMMUNITY)
Admission: EM | Admit: 2018-05-25 | Discharge: 2018-05-25 | Disposition: A | Discharge status: Home / Self Care | Payer: Self-pay | Attending: Emergency Medicine | Admitting: Emergency Medicine

## 2018-05-25 ENCOUNTER — Emergency Department (HOSPITAL_COMMUNITY): Payer: Self-pay

## 2018-05-25 DIAGNOSIS — Y939 Activity, unspecified: Secondary | ICD-10-CM | POA: Insufficient documentation

## 2018-05-25 DIAGNOSIS — I10 Essential (primary) hypertension: Secondary | ICD-10-CM | POA: Insufficient documentation

## 2018-05-25 DIAGNOSIS — E119 Type 2 diabetes mellitus without complications: Secondary | ICD-10-CM | POA: Insufficient documentation

## 2018-05-25 DIAGNOSIS — M7022 Olecranon bursitis, left elbow: Secondary | ICD-10-CM | POA: Insufficient documentation

## 2018-05-25 MED ORDER — LIDOCAINE-EPINEPHRINE (PF) 2 %-1:200000 IJ SOLN
10.0000 mL | Freq: Once | INTRAMUSCULAR | Status: DC
  Filled 2018-05-25: qty 20

## 2018-05-25 NOTE — ED Notes (Signed)
Pt unable to sign for discharge paper work due to being in the hallway and no available computer with signature pad. Reviewed d/c paperwork with patient, given work note and escorted out to lobby.

## 2018-05-25 NOTE — Discharge Instructions (Signed)
Bursitis is an inflammatory process where fluid collects in the bursa.  There is no infection however this can worsen and get infected.   Main treatment is to address inflammation and avoid further trauma until symptoms improve.  Bursitis can reoccur.   Your bursa was drained today, remember fluid can recollect after a few days.   Take meloxicam 7.5 mg daily and acetaminophen (tylenol) 1000 mg every 6 hours. Avoid trauma, do not lean your elbow onto tables, floor. I recommend you rest from work for at least 48 hours, or use your other hand. You can purchase an over the counter elbow pad to avoid further trauma, these are available online as well.   Monitor for signs of infection including increased pain, inability to move elbow due to stiffness and severe pain, worsening swelling, redness, warmth, pain, fevers, chills.

## 2018-05-25 NOTE — ED Triage Notes (Signed)
Pt reports he woke up Saturday morning and found his left elbow swollen.  Today when he woke up the elbow produced pain.  It is swollen and hot to the touch.  Does not remember injuring it.

## 2018-05-25 NOTE — ED Provider Notes (Signed)
[facility] Provider Note   CSN: [id number] Arrival date & time: 05/25/18  4259     History   Chief Complaint Chief Complaint  Patient presents with  . Elbow Pain    HPI Kyle Merritt is a 57 y.o. male with history of diabetes, hypertension is here for evaluation of left elbow swelling onset yesterday morning.  Associated with pain described as "tightness" that developed last night and warmth.  Pain 8/10.  No trauma.  No history of the same.  Denies history of gout, IV drug use, joint infection.  He denies fevers, chills, or radiation into the arm or chest, distal paresthesias or numbness.  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  He is left-handed.  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  No interventions.  Aggravated by movement and direct touch.  He has minimal pain at rest.  HPI  Past Medical History:  Diagnosis Date  . Colon cancer ([place])   . Diabetes mellitus without complication ([place])   . High cholesterol   . History of colon cancer   . Hypertension     Patient Active Problem List   Diagnosis Date Noted  . Cervical disc disorder with radiculopathy of cervical region 05/08/2018  . Other chronic pain 02/04/2018  . History of cocaine use 02/04/2018  . Hyperlipidemia 02/04/2018  . Colon cancer screening 11/29/2014  . Knee pain, acute 10/04/2014  . Essential hypertension 08/02/2014  . Dental abscess 08/02/2014  . Diabetes ([place]) 04/29/2014  . HTN (hypertension) 04/29/2014  . Dyslipidemia 04/29/2014  . History of colon cancer 04/29/2014  . Back pain 04/29/2014  . Erectile dysfunction 04/29/2014  . Tobacco abuse 07/27/2013  . PURE HYPERCHOLESTEROLEMIA 12/31/2008  . TESTOSTERONE DEFICIENCY 12/10/2008  . GERD 12/08/2008  . ONYCHOMYCOSIS, BILATERAL 11/09/2008  . DIABETES MELLITUS 11/09/2008  . COLON CANCER 12/24/2002    Past Surgical History:  Procedure Laterality Date  . COLON SURGERY           Home Medications    Prior to Admission medications   Medication Sig Start Date End Date Taking? Authorizing Provider  gabapentin (NEURONTIN) 300 MG capsule Take 1 capsule (300 mg total) by mouth 4 (four) times daily. 04/28/18   Dorena Dew, FNP  lisinopril-hydrochlorothiazide (PRINZIDE,ZESTORETIC) 20-12.5 MG tablet Take 1 tablet by mouth daily. 04/28/18   Dorena Dew, FNP  meloxicam (MOBIC) 7.5 MG tablet Take 1 tablet (7.5 mg total) by mouth daily. 04/28/18   Dorena Dew, FNP  metFORMIN (GLUCOPHAGE) 1000 MG tablet Take 1 tablet (1,000 mg total) by mouth 2 (two) times daily with a meal. 04/28/18   Dorena Dew, FNP  predniSONE (DELTASONE) 10 MG tablet 6 tabs po day 1, 5 tabs po day 2, 4 tabs po day 3, 3 tabs po day 4, 2 tabs po day 5, 1 tab po day 6 05/07/18   Hudnall, Sharyn Lull, MD  sildenafil (VIAGRA) 25 MG tablet Take 1 tablet (25 mg total) by mouth as needed for erectile dysfunction. 04/28/18   Dorena Dew, FNP  simvastatin (ZOCOR) 40 MG tablet Take 1 tablet (40 mg total) by mouth daily. Patient not taking: Reported on 05/07/2018 01/29/18   Dorena Dew, FNP  sitaGLIPtin (JANUVIA) 50 MG tablet Take 1 tablet (50 mg total) by mouth 2 (two) times daily. 04/28/18   Dorena Dew, FNP    Family History Family History  Problem Relation Age of Onset  .  Diabetes Mother   . Hypertension Mother     Social History Social History   Tobacco Use  . Smoking status: Never Smoker  . Smokeless tobacco: Never Used  Substance Use Topics  . Alcohol use: No  . Drug use: No     Allergies   Patient has no known allergies.   Review of Systems Review of Systems  Musculoskeletal: Positive for arthralgias and joint swelling.  All other systems reviewed and are negative.    Physical Exam Updated Vital Signs BP (!) 175/106   Pulse (!) 101   Temp 98.5 F (36.9 C) (Oral)   Resp 17   Ht 6' (1.829 m)   Wt 108.4 kg (239 lb)   SpO2 100%   BMI 32.41 kg/m    Physical Exam  Constitutional: He is oriented to person, place, and time. He appears well-developed and well-nourished. No distress.  HENT:  Head: Normocephalic and atraumatic.  Right Ear: External ear normal.  Left Ear: External ear normal.  Nose: Nose normal.  Eyes: Conjunctivae are normal.  Neck: Normal range of motion.  Cardiovascular: Normal rate, regular rhythm and normal heart sounds.  2+ radial and ulnar pulses bilaterally, fingers warm.   Pulmonary/Chest: Effort normal and breath sounds normal.  Musculoskeletal: Normal range of motion. He exhibits edema and tenderness. He exhibits no deformity.  Moderate focal fluctuance over olecranon process with questionable erythema and warmth, no significant expansion of this proximal or distally from elbow. No edema to [facility] fossa. Pt has full PROM and AROm of left elbow with mild pain. No focal bony tenderness otherwise. 5/5 strength with elbow F/E.   Neurological: He is alert and oriented to person, place, and time.  Sensation to light touch intact in median, ulnar, radial nerve distribution of left upper extremity   Skin: Skin is warm and dry. Capillary refill takes less than 2 seconds.  Psychiatric: He has a normal mood and affect. His behavior is normal. Judgment and thought content normal.  Nursing note and vitals reviewed.    ED Treatments / Results  Labs (all labs ordered are listed, but only abnormal results are displayed) Labs Reviewed - No data to display  EKG None  Radiology Dg Elbow Complete Left  Result Date: 05/25/2018 CLINICAL DATA:  LEFT elbow swelling and pain, starting yesterday. No known injury. EXAM: LEFT ELBOW - COMPLETE 3+ VIEW COMPARISON:  None. FINDINGS: Osseous alignment is normal. Bone mineralization is normal. No acute or suspicious osseous finding. No appreciable joint effusion. Chronic spurring noted over the olecranon. There is soft tissue thickening/edema posterior to the LEFT elbow, possibly related to  the underlying bursa. No soft tissue gas. No radiodense foreign body. IMPRESSION: 1. Soft tissue thickening/edema posterior to the LEFT elbow, possibly related to underlying bursitis, possibly cellulitis confined to the subcutaneous soft tissues. 2. No evidence of underlying joint effusion. 3. No acute appearing osseous abnormality. Electronically Signed   By: Franki Cabot M.D.   On: 05/25/2018 08:23    Procedures .Marland KitchenIncision and Drainage Date/Time: 05/25/2018 9:26 AM Performed by: Kinnie Feil, PA-C Authorized by: Kinnie Feil, PA-C   Consent:    Consent obtained:  Verbal   Consent given by:  Patient   Risks discussed:  Bleeding, incomplete drainage, infection and pain (reoccurence)   Alternatives discussed:  No treatment and alternative treatment Location:    Type:  Bursa   Size:  Approx 4 x 4 covering olecranon process   Location:  Upper extremity   Upper extremity  location:  Elbow   Elbow location:  L elbow Pre-procedure details:    Skin preparation:  Chloraprep Anesthesia (see MAR for exact dosages):    Anesthesia method:  Local infiltration   Local anesthetic:  Lidocaine 2% WITH epi Procedure type:    Complexity:  Simple Procedure details:    Needle aspiration: yes     Needle size:  18 G   Drainage characteristics: straw blood tinged    Drainage amount:  Moderate   Wound treatment:  Wound left open   Packing materials:  None Post-procedure details:    Patient tolerance of procedure:  Tolerated well, no immediate complications Comments:     Hemostatic, covered with bandaid.    (including critical care time)  Medications Ordered in ED Medications  lidocaine-EPINEPHrine (XYLOCAINE W/EPI) 2 %-1:200000 (PF) injection 10 mL (has no administration in time range)     Initial Impression / Assessment and Plan / ED Course  I have reviewed the triage vital signs and the nursing notes.  Pertinent labs & imaging results that were available during my care of the  patient were reviewed by me and considered in my medical decision making (see chart for details).    Ddx gout, bursitis, septic arthritis, osteomyelitis. Atraumatic.   X-ray reviewed, given exam favoring bursitis. He has no h/o septic arthritis, IVDU, osteo.  His symptoms onset < 24 hours. He only has mild pain with PROM and no significant expansion of edema, erythema, warmth, tenderness proximally or distally.  He is LHD and works washing cars which could lead to repetitive micro trauma/joint/bursa inflammation. Bursa was drained with 6 cc straw blood tinged clear fluid with significant improvement in edema and discomfort. Pt had no pain after procedure.  No immediate complications of procedure.  Will dc with NSAIDS, joint protection, ice.  Discussed possibility of superimposed infection with patient and he is aware of s/s that would warrant return to ER for re-evaluation. Pt verbalized understanding and is in agreement.  Final Clinical Impressions(s) / ED Diagnoses   Final diagnoses:  Olecranon bursitis of left elbow    ED Discharge Orders    None       Kinnie Feil, PA-C 05/25/18 8250    Isla Pence, MD 05/25/18 1046

## 2018-05-26 ENCOUNTER — Ambulatory Visit: Payer: Self-pay | Admitting: Physical Therapy

## 2018-05-28 ENCOUNTER — Ambulatory Visit: Payer: Self-pay | Attending: Family Medicine | Admitting: Physical Therapy

## 2018-05-28 ENCOUNTER — Other Ambulatory Visit: Payer: Self-pay | Admitting: Family Medicine

## 2018-05-28 ENCOUNTER — Encounter: Payer: Self-pay | Admitting: Physical Therapy

## 2018-05-28 DIAGNOSIS — M542 Cervicalgia: Secondary | ICD-10-CM | POA: Insufficient documentation

## 2018-05-28 DIAGNOSIS — G8929 Other chronic pain: Secondary | ICD-10-CM

## 2018-05-28 DIAGNOSIS — R208 Other disturbances of skin sensation: Secondary | ICD-10-CM | POA: Insufficient documentation

## 2018-05-28 DIAGNOSIS — N529 Male erectile dysfunction, unspecified: Secondary | ICD-10-CM

## 2018-05-28 DIAGNOSIS — R293 Abnormal posture: Secondary | ICD-10-CM | POA: Insufficient documentation

## 2018-05-28 MED FILL — metFORMIN HCL 1000 MG TABS: 1000 | 30 days supply | Qty: 60 | Fill #1

## 2018-05-28 MED FILL — GABAPENTIN 300 MG CAPSULE: 300 | 30 days supply | Qty: 120 | Fill #1

## 2018-05-28 MED FILL — LISINOPRIL-HCTZ 20-12.5 MG: 20-12.5 | 30 days supply | Qty: 30 | Fill #1

## 2018-05-28 MED FILL — !VIAGRA 25 MG TABLET: 25 | 30 days supply | Qty: 10 | Fill #0

## 2018-05-28 MED FILL — MELOXICAM 7.5 MG TABLET: 7.5 | 30 days supply | Qty: 30 | Fill #0

## 2018-05-28 NOTE — Therapy (Signed)
[place], [place], [address] Phone: [phone]   Fax:  [phone]  Physical Therapy Treatment  Patient Details  Name: Kyle Merritt MRN: [id number] Date of Birth: 1961-10-23 Referring Provider: Blase Mess    Encounter Date: 05/28/2018  PT End of Session - 05/28/18 1004    Visit Number  2    Number of Visits  13    Date for PT Re-Evaluation  06/02/18    PT Start Time  0931    PT Stop Time  1015    PT Time Calculation (min)  44 min    Activity Tolerance  Patient tolerated treatment well    Behavior During Therapy  [facility] for tasks assessed/performed       Past Medical History:  Diagnosis Date  . Colon cancer ([place])   . Diabetes mellitus without complication ([employer])   . High cholesterol   . History of colon cancer   . Hypertension     Past Surgical History:  Procedure Laterality Date  . COLON SURGERY      There were no vitals filed for this visit.  Subjective Assessment - 05/28/18 0937    Subjective  "I did go to the ED due to L elbow pain and swelling and they pulled alot of fluid of my elbow. the neck is doing much and I have been doing my exercises nightly"     Currently in Pain?  Yes    Pain Score  5     Pain Location  Neck    Pain Orientation  Left    Pain Descriptors / Indicators  Aching    Pain Onset  More than a month ago    Pain Frequency  Intermittent    Aggravating Factors   Leaning head to the R                        [facility] Adult PT Treatment/Exercise - 05/28/18 0949      Neck Exercises: Seated   Other Seated Exercise  rows 2 x 10 (cues to avoid hiking of the shoulder, and sit up straight for improved posture    Other Seated Exercise  SNAGS 1 x 10 bil with 5 sec hold ea.      Modalities   Modalities  Traction      Traction   Type of Traction  Cervical    Min (lbs)  7    Max (lbs)  12    Hold Time  60    Rest Time  30    Time  10      Manual Therapy   Manual Therapy  Joint mobilization;Soft tissue mobilization;Myofascial release;Neural Stretch    Manual therapy comments  MTPr along pec major/ minor     Joint Mobilization  L lateral gapping C5-C7 grade 3    Soft tissue mobilization  IASTM along scalenes    Neural Stretch  median nerve glides flossing wrist back and forth             PT Education - 05/28/18 1004    Education provided  Yes    Education Details  reviewed previously provided HEP, and updated for SNAGS    Person(s) Educated  Patient    Methods  Explanation;Verbal cues;Handout    Comprehension  Verbalized understanding;Verbal cues required       PT Short Term Goals - 05/12/18 1220      PT SHORT TERM GOAL #1  Title  Patient to demonstrate full cervical and thoracic ROM on all planes in order to show improved mobility and mechanics     Time  3    Period  Weeks    Status  New    Target Date  06/02/18      PT SHORT TERM GOAL #2   Title  Patient to demonstrate functional B shoulder ER to at least T4 and B shoulder to at least T10 in order to show improved functional shoulder ROM     Time  3    Period  Weeks    Status  New      PT SHORT TERM GOAL #3   Title  Patient to report pain as being no more than 4/10 at worst in order to improve QOL and functional task tolerance     Time  3    Period  Weeks    Status  New      PT SHORT TERM GOAL #4   Title  Patient to be able to maintain correct functional posture with external cues no more than 25% of the time in order to reduce pain and restore functional mechanics and movement patterns     Time  3    Period  Weeks    Status  New        PT Long Term Goals - 05/12/18 1222      PT LONG TERM GOAL #1   Title  Patient to demonstrate functional strength as being 5/5 in all tested muscle groups in order to assist in reducing pain and restoring functional peformance     Time  6    Period  Weeks    Status  New    Target Date  06/23/18      PT LONG TERM GOAL #2    Title  Patient to be able to perform overhead tasks such as changing a lightbulb or placing a 10# item on an overhead shelf without pain exacerbation in order to improve functional task performance     Time  6    Period  Weeks    Status  New      PT LONG TERM GOAL #3   Title  Patient to be able to perform functional tasks such as dressing and bathing without pain exacerbation in order to show general improvement in condition     Time  6    Period  Weeks    Status  New      PT LONG TERM GOAL #4   Title  Patient to have returned to work without pain exacerbation in order to show general resolution of symptoms and return to regular activities     Time  6    Period  Weeks    Status  New            Plan - 05/28/18 1004    Clinical Impression Statement  pt reports he has been compliant with his HEp and notes improvement but conitnues to have N/T in bil 3rd digits. cervical mobs and gapping relieved N/T and soft tissue work. continued working on posture which he requires frequent verbal cues. trialed mechanical traction which he reported signifcant relief.     PT Treatment/Interventions  ADLs/Self Care Home Management;Biofeedback;Cryotherapy;Electrical Stimulation;Iontophoresis 4mg /ml Dexamethasone;Moist Heat;Traction;Ultrasound;DME Instruction;Functional mobility training;Therapeutic activities;Therapeutic exercise;Balance training;Neuromuscular re-education;Patient/family education;Manual techniques;Passive range of motion;Dry needling;Taping    PT Next Visit Plan  review HEP and goals; focus on postural training, cervical and shoulder ROM and  strength, manual cervical traction     PT Home Exercise Plan  Eval: chin tucks, scap retractions, shoulder rolls, triceps stretch , SNAGS.    Consulted and Agree with Plan of Care  Patient       Patient will benefit from skilled therapeutic intervention in order to improve the following deficits and impairments:  Increased fascial restricitons,  Improper body mechanics, Pain, Impaired sensation, Decreased coordination, Increased muscle spasms, Postural dysfunction, Decreased range of motion, Decreased strength, Hypomobility, Impaired UE functional use, Impaired flexibility  Visit Diagnosis: Cervicalgia  Abnormal posture  Other disturbances of skin sensation     Problem List Patient Active Problem List   Diagnosis Date Noted  . Cervical disc disorder with radiculopathy of cervical region 05/08/2018  . Other chronic pain 02/04/2018  . History of cocaine use 02/04/2018  . Hyperlipidemia 02/04/2018  . Colon cancer screening 11/29/2014  . Knee pain, acute 10/04/2014  . Essential hypertension 08/02/2014  . Dental abscess 08/02/2014  . Diabetes ([place]) 04/29/2014  . HTN (hypertension) 04/29/2014  . Dyslipidemia 04/29/2014  . History of colon cancer 04/29/2014  . Back pain 04/29/2014  . Erectile dysfunction 04/29/2014  . Tobacco abuse 07/27/2013  . PURE HYPERCHOLESTEROLEMIA 12/31/2008  . TESTOSTERONE DEFICIENCY 12/10/2008  . GERD 12/08/2008  . ONYCHOMYCOSIS, BILATERAL 11/09/2008  . DIABETES MELLITUS 11/09/2008  . COLON CANCER 12/24/2002   Starr Lake PT, DPT, LAT, ATC  05/28/18  10:07 AM      [facility] [address] [place], [place], [address] Phone: [phone]   Fax:  [phone]  Name: Kyle Merritt MRN: [id number] Date of Birth: November 10, 1961

## 2018-06-02 ENCOUNTER — Encounter: Payer: Self-pay | Admitting: Physical Therapy

## 2018-06-02 ENCOUNTER — Ambulatory Visit: Payer: Self-pay | Admitting: Physical Therapy

## 2018-06-02 DIAGNOSIS — R208 Other disturbances of skin sensation: Secondary | ICD-10-CM

## 2018-06-02 DIAGNOSIS — R293 Abnormal posture: Secondary | ICD-10-CM

## 2018-06-02 DIAGNOSIS — M542 Cervicalgia: Secondary | ICD-10-CM

## 2018-06-02 NOTE — Therapy (Signed)
[place], [place], [address] Phone: [phone]   Fax:  [phone]  Physical Therapy Treatment  Patient Details  Name: Kyle Merritt MRN: [id number] Date of Birth: 08/04/1961 Referring Provider: Blase Mess    Encounter Date: 06/02/2018  PT End of Session - 06/02/18 0855    Visit Number  3    Number of Visits  13    Date for PT Re-Evaluation  06/02/18    Authorization Type  CAFA expires 08/17/18    Authorization Time Period  05/12/18 to 06/23/18    PT Start Time  0845    PT Stop Time  0935    PT Time Calculation (min)  50 min       Past Medical History:  Diagnosis Date  . Colon cancer ([place])   . Diabetes mellitus without complication ([place])   . High cholesterol   . History of colon cancer   . Hypertension     Past Surgical History:  Procedure Laterality Date  . COLON SURGERY      There were no vitals filed for this visit.  Subjective Assessment - 06/02/18 0846    Subjective  Left elbow and shoulder pain 5/10     Currently in Pain?  Yes    Pain Score  5     Pain Location  Neck    Pain Orientation  Left    Pain Descriptors / Indicators  Throbbing;Tingling    Pain Radiating Towards  down LUE to fingertips     Aggravating Factors   left side bend                        [facility] Adult PT Treatment/Exercise - 06/02/18 0001      Neck Exercises: Theraband   Rows  15 reps red      Neck Exercises: Standing   Other Standing Exercises  standing shoulder pullovers with cane- increased pain , standing cane Internal rotation x 10 each       Neck Exercises: Seated   Neck Retraction  10 reps    Shoulder Rolls  10 reps;Backwards    Other Seated Exercise  SNAGS 1 x 10 bil with 5 sec hold ea. max cues      Neck Exercises: Supine   Other Supine Exercise  supine cane pressups and pullovers , horizontal abduction and ER       Traction   Min (lbs)  7    Max (lbs)  14    Hold Time  60     Rest Time  10    Time  13      Manual Therapy   Soft tissue mobilization  Trigger point release bilateral upper traps       Neck Exercises: Stretches   Corner Stretch  10 seconds;4 reps    Other Neck Stretches  PROM rotation and side bend bilateral              PT Education - 06/02/18 0924    Education provided  Yes    Education Details  HEP update     Person(s) Educated  Patient    Methods  Explanation;Handout    Comprehension  Verbalized understanding       PT Short Term Goals - 05/12/18 1220      PT SHORT TERM GOAL #1   Title  Patient to demonstrate full cervical and thoracic ROM on all planes in order to show improved  mobility and mechanics     Time  3    Period  Weeks    Status  New    Target Date  06/02/18      PT SHORT TERM GOAL #2   Title  Patient to demonstrate functional B shoulder ER to at least T4 and B shoulder to at least T10 in order to show improved functional shoulder ROM     Time  3    Period  Weeks    Status  New      PT SHORT TERM GOAL #3   Title  Patient to report pain as being no more than 4/10 at worst in order to improve QOL and functional task tolerance     Time  3    Period  Weeks    Status  New      PT SHORT TERM GOAL #4   Title  Patient to be able to maintain correct functional posture with external cues no more than 25% of the time in order to reduce pain and restore functional mechanics and movement patterns     Time  3    Period  Weeks    Status  New        PT Long Term Goals - 05/12/18 1222      PT LONG TERM GOAL #1   Title  Patient to demonstrate functional strength as being 5/5 in all tested muscle groups in order to assist in reducing pain and restoring functional peformance     Time  6    Period  Weeks    Status  New    Target Date  06/23/18      PT LONG TERM GOAL #2   Title  Patient to be able to perform overhead tasks such as changing a lightbulb or placing a 10# item on an overhead shelf without pain  exacerbation in order to improve functional task performance     Time  6    Period  Weeks    Status  New      PT LONG TERM GOAL #3   Title  Patient to be able to perform functional tasks such as dressing and bathing without pain exacerbation in order to show general improvement in condition     Time  6    Period  Weeks    Status  New      PT LONG TERM GOAL #4   Title  Patient to have returned to work without pain exacerbation in order to show general resolution of symptoms and return to regular activities     Time  6    Period  Weeks    Status  New            Plan - 06/02/18 3903    Clinical Impression Statement  Pt reports decreased N/T in left hand and improved sleeping ability. Right digits continue with numbness. Progressed Shoulder ROM and repeated cervical traction as he thought it was helpful. Updated HEP.     PT Next Visit Plan  review HEP and goals; focus on postural training, cervical and shoulder ROM and strength, manual cervical traction     PT Home Exercise Plan  Eval: chin tucks, scap retractions, shoulder rolls, triceps stretch , SNAGS. shoulde rows red band    Consulted and Agree with Plan of Care  Patient       Patient will benefit from skilled therapeutic intervention in order to improve the following deficits and impairments:  Increased fascial restricitons, Improper body mechanics, Pain, Impaired sensation, Decreased coordination, Increased muscle spasms, Postural dysfunction, Decreased range of motion, Decreased strength, Hypomobility, Impaired UE functional use, Impaired flexibility  Visit Diagnosis: Cervicalgia  Abnormal posture  Other disturbances of skin sensation     Problem List Patient Active Problem List   Diagnosis Date Noted  . Cervical disc disorder with radiculopathy of cervical region 05/08/2018  . Other chronic pain 02/04/2018  . History of cocaine use 02/04/2018  . Hyperlipidemia 02/04/2018  . Colon cancer screening 11/29/2014  .  Knee pain, acute 10/04/2014  . Essential hypertension 08/02/2014  . Dental abscess 08/02/2014  . Diabetes ([place]) 04/29/2014  . HTN (hypertension) 04/29/2014  . Dyslipidemia 04/29/2014  . History of colon cancer 04/29/2014  . Back pain 04/29/2014  . Erectile dysfunction 04/29/2014  . Tobacco abuse 07/27/2013  . PURE HYPERCHOLESTEROLEMIA 12/31/2008  . TESTOSTERONE DEFICIENCY 12/10/2008  . GERD 12/08/2008  . ONYCHOMYCOSIS, BILATERAL 11/09/2008  . DIABETES MELLITUS 11/09/2008  . COLON CANCER 12/24/2002    Hessie Diener [place], [place] 06/02/2018, 9:24 AM  [facility] [address] [place], [place], [address] Phone: [phone]   Fax:  [phone]  Name: Kingson Lohmeyer MRN: [id number] Date of Birth: 26-Nov-1961

## 2018-06-02 NOTE — Patient Instructions (Signed)
  Resistive Band Rowing   With resistive band anchored in door, grasp both ends. Keeping elbows bent, pull back, squeezing shoulder blades together. Hold __5__ seconds. Repeat _20___ times. Do __2__ sessions per day.  http://gt2.exer.us/97   Copyright  VHI. All rights reserved.

## 2018-06-04 ENCOUNTER — Ambulatory Visit (INDEPENDENT_AMBULATORY_CARE_PROVIDER_SITE_OTHER): Payer: Self-pay | Admitting: Family Medicine

## 2018-06-04 ENCOUNTER — Ambulatory Visit: Payer: Self-pay | Admitting: Physical Therapy

## 2018-06-04 ENCOUNTER — Encounter: Payer: Self-pay | Admitting: Physical Therapy

## 2018-06-04 ENCOUNTER — Encounter: Payer: Self-pay | Admitting: Family Medicine

## 2018-06-04 DIAGNOSIS — R208 Other disturbances of skin sensation: Secondary | ICD-10-CM

## 2018-06-04 DIAGNOSIS — M542 Cervicalgia: Secondary | ICD-10-CM

## 2018-06-04 DIAGNOSIS — M6530 Trigger finger, unspecified finger: Secondary | ICD-10-CM

## 2018-06-04 DIAGNOSIS — M5010 Cervical disc disorder with radiculopathy, unspecified cervical region: Secondary | ICD-10-CM

## 2018-06-04 DIAGNOSIS — M7022 Olecranon bursitis, left elbow: Secondary | ICD-10-CM | POA: Insufficient documentation

## 2018-06-04 DIAGNOSIS — R293 Abnormal posture: Secondary | ICD-10-CM

## 2018-06-04 NOTE — Assessment & Plan Note (Signed)
reviewed splinting.  Not painful.  Consider injection in future if develops pain.

## 2018-06-04 NOTE — Therapy (Signed)
[place], [place], [address] Phone: [phone]   Fax:  [phone]  Physical Therapy Treatment / Re-certification  Patient Details  Name: Kyle Merritt MRN: [id number] Date of Birth: [birth year] Referring Provider: Blase Mess    Encounter Date: 06/04/2018  PT End of Session - 06/04/18 0915    Visit Number  4    Number of Visits  13    Date for PT Re-Evaluation  07/02/18    Authorization Type  CAFA expires 08/17/18    Authorization Time Period  05/12/18 to 06/23/18    PT Start Time  0818    PT Stop Time  0921    PT Time Calculation (min)  63 min    Activity Tolerance  Patient tolerated treatment well    Behavior During Therapy  [facility] for tasks assessed/performed       Past Medical History:  Diagnosis Date  . Colon cancer ([place])   . Diabetes mellitus without complication ([place])   . High cholesterol   . History of colon cancer   . Hypertension     Past Surgical History:  Procedure Laterality Date  . COLON SURGERY      There were no vitals filed for this visit.  Subjective Assessment - 06/04/18 0826    Subjective  Pt reports decreased pain and states he has been doing exercises, mostly on Wednesdays.    Pain Score  3     Pain Location  Elbow    Pain Orientation  Left    Pain Descriptors / Indicators  Throbbing    Pain Type  Chronic pain    Pain Radiating Towards  Down to LUE fingertips    Pain Onset  More than a month ago    Pain Frequency  Intermittent         [facility] PT Assessment - 06/04/18 0832      AROM   Right Shoulder Flexion  151 Degrees    Right Shoulder ABduction  136 Degrees    Right Shoulder Internal Rotation  -- L2    Right Shoulder External Rotation  -- T4    Left Shoulder Flexion  147 Degrees    Left Shoulder ABduction  141 Degrees    Left Shoulder Internal Rotation  -- L2    Left Shoulder External Rotation  -- T4    Cervical Flexion  26 radiating down LUE    Cervical  Extension  40    Cervical - Right Side Bend  30 contralateral pain    Cervical - Left Side Bend  32    Cervical - Right Rotation  45    Cervical - Left Rotation  60 painful       Strength   Right Shoulder Flexion  4+/5    Right Shoulder ABduction  4/5    Right Shoulder Internal Rotation  4+/5    Right Shoulder External Rotation  4-/5    Left Shoulder Flexion  4+/5    Left Shoulder Extension  5/5    Left Shoulder Internal Rotation  4+/5    Left Shoulder External Rotation  4-/5                   [facility] Adult PT Treatment/Exercise - 06/04/18 0848      Neck Exercises: Seated   Neck Retraction  10 reps 10 sec hold    Cervical Rotation  10 reps;Both    Other Seated Exercise  --    Other  Seated Exercise  SNAGS x 10 Bil 2-3 sec hold      Shoulder Exercises: Standing   Row  AROM;Strengthening;Theraband;10 reps x 2 sets.    Theraband Level (Shoulder Row)  Level 2 (Red)      Traction   Min (lbs)  8    Max (lbs)  14    Hold Time  60    Rest Time  10    Time  13      Manual Therapy   Manual Therapy  Neural Stretch L median nerve glide seated      Neck Exercises: Stretches   Upper Trapezius Stretch  2 reps;30 seconds;Left             PT Education - 06/04/18 0914    Education provided  Yes    Education Details  reviewed HEP and updated for stretching. reviewed benefits of posture in sitting. updated POC and discussed pt progress since starting PT.     Person(s) Educated  Patient    Methods  Handout;Verbal cues;Demonstration;Explanation    Comprehension  Verbalized understanding;Returned demonstration;Verbal cues required       PT Short Term Goals - 06/04/18 0844      PT SHORT TERM GOAL #1   Title  Patient to demonstrate full cervical and thoracic ROM on all planes in order to show improved mobility and mechanics     Status  On-going      PT SHORT TERM GOAL #2   Title  Patient to demonstrate functional B shoulder ER to at least T4 and B shoulder to at  least T10 in order to show improved functional shoulder ROM     Baseline  Able to achieve B shoulder ER to T4    Status  Partially Met      PT SHORT TERM GOAL #3   Title  Patient to report pain as being no more than 4/10 at worst in order to improve QOL and functional task tolerance     Baseline  8/10 pain at worst    Status  On-going      PT SHORT TERM GOAL #4   Title  Patient to be able to maintain correct functional posture with external cues no more than 25% of the time in order to reduce pain and restore functional mechanics and movement patterns     Status  On-going        PT Long Term Goals - 06/04/18 0846      PT LONG TERM GOAL #1   Title  Patient to demonstrate functional strength as being 5/5 in all tested muscle groups in order to assist in reducing pain and restoring functional peformance     Status  On-going      PT LONG TERM GOAL #2   Title  Patient to be able to perform overhead tasks such as changing a lightbulb or placing a 10# item on an overhead shelf without pain exacerbation in order to improve functional task performance     Status  On-going      PT LONG TERM GOAL #3   Title  Patient to be able to perform functional tasks such as dressing and bathing without pain exacerbation in order to show general improvement in condition     Status  On-going      PT LONG TERM GOAL #4   Title  Patient to have returned to work without pain exacerbation in order to show general resolution of symptoms and return to regular activities  Period  Weeks    Status  On-going            Plan - 06/04/18 0917    Clinical Impression Statement  Pt reports he has been performing HEP. he has continued complaints of pain and tingling into LUE down into 2nd and 3rd digits. Reassessed pt's ROM, strength, and goals today. He shows some improvements in AROM in B shoulder IR and ER, but is still lacking full cervical AROM. Making progress with short term goals but still demonstrating  deficits. Pt would benefit from further OPPT services to address pain, strength, ROM, and functional limitations.     Rehab Potential  Good    PT Frequency  2x / week    PT Duration  4 weeks    PT Treatment/Interventions  ADLs/Self Care Home Management;Biofeedback;Cryotherapy;Electrical Stimulation;Iontophoresis 62m/ml Dexamethasone;Moist Heat;Traction;Ultrasound;DME Instruction;Functional mobility training;Therapeutic activities;Therapeutic exercise;Balance training;Neuromuscular re-education;Patient/family education;Manual techniques;Passive range of motion;Dry needling;Taping    PT Next Visit Plan  Review and update HEP prn, postural training, cervical and shoulder ROM, shoulder strengthening, cervical traction    PT Home Exercise Plan  added pec stretch and upper trap stretch    Consulted and Agree with Plan of Care  Patient       Patient will benefit from skilled therapeutic intervention in order to improve the following deficits and impairments:  Increased fascial restricitons, Improper body mechanics, Pain, Impaired sensation, Decreased coordination, Increased muscle spasms, Postural dysfunction, Decreased range of motion, Decreased strength, Hypomobility, Impaired UE functional use, Impaired flexibility  Visit Diagnosis: Cervicalgia  Abnormal posture  Other disturbances of skin sensation     Problem List Patient Active Problem List   Diagnosis Date Noted  . Cervical disc disorder with radiculopathy of cervical region 05/08/2018  . Other chronic pain 02/04/2018  . History of cocaine use 02/04/2018  . Hyperlipidemia 02/04/2018  . Colon cancer screening 11/29/2014  . Knee pain, acute 10/04/2014  . Essential hypertension 08/02/2014  . Dental abscess 08/02/2014  . Diabetes ([place] 04/29/2014  . HTN (hypertension) 04/29/2014  . Dyslipidemia 04/29/2014  . History of colon cancer 04/29/2014  . Back pain 04/29/2014  . Erectile dysfunction 04/29/2014  . Tobacco abuse 07/27/2013   . PURE HYPERCHOLESTEROLEMIA 12/31/2008  . TESTOSTERONE DEFICIENCY 12/10/2008  . GERD 12/08/2008  . ONYCHOMYCOSIS, BILATERAL 11/09/2008  . DIABETES MELLITUS 11/09/2008  . COLON CANCER 12/24/2002   KStarr LakePT, DPT, LAT, ATC  06/04/18  9:34 AM      [facility]GH[place] [place] [address]Phone: [phone]  Fax:  [phone] Name: HDeontrey MassiMRN: [id number] of Birth: 117-Nov-1962

## 2018-06-04 NOTE — Patient Instructions (Signed)
You have olecranon bursitis. Ice the area 3-4 times a day for 15 minutes at a time Take mobic for pain, swelling, and inflammation. Compression with ACE wrap or elbow sleeve is a consideration to help also. Consider aspiration and injection of the area if this worsens or becomes red, very painful.  Try the band-aid trick at the joint especially when sleeping to try to help the trigger thumb you have. If this becomes painful we can consider an injection.  Continue physical therapy for your neck and arms. We can consider an MRI if you're not continuing to improve. Follow up with me in 1 month to 6 weeks.

## 2018-06-04 NOTE — Assessment & Plan Note (Signed)
encouraged compression with mobic, icing.  Would not inject or aspirate again at this time.

## 2018-06-04 NOTE — Assessment & Plan Note (Signed)
2/2 radiculopathy, spinal stenosis.  Mildly improved.  Continue physical therapy and home exercises.  Mobic.  S/p prednisone.  F/u in 1 month to 6 weeks.

## 2018-06-04 NOTE — Progress Notes (Signed)
PCP: Dorena Dew, FNP  Subjective:   HPI: Patient is a 57 y.o. male here for bilateral shoulder pain.  5/15: Patient reports about 4-5 months ago he started to get pain in his left wrist that radiated up his arm. Has progressed to include pain in right moreso than left shoulders anteriorly. Right side associated with numbness into right index and middle digits. Moving neck to the left side makes his right arm hurt. Has tried gabapentin. Had an arthritis panel that was negative. He drives trucks for work. Difficulty sleeping due to pain - 9/10 level with throbbing. No bowel/bladder dysfunction.  6/12: Patient reports he's improving from his neck/shoulder pain mildly. He is doing physical therapy (done 3 visits so far) and home exercises. Still with radiation into right > left side with tingling into right index and middle digits. Taking mobic. Recently went to ED for recurrence of left olecranon bursitis - had 18mL drained from this and has no pain, redness, fever. Also with catching of his thumb at IP joint right hand worse in morning but no pain with this.  Past Medical History:  Diagnosis Date  . Colon cancer ([place])   . Diabetes mellitus without complication ([place])   . High cholesterol   . History of colon cancer   . Hypertension     Current Outpatient Medications on File Prior to Visit  Medication Sig Dispense Refill  . gabapentin (NEURONTIN) 300 MG capsule Take 1 capsule (300 mg total) by mouth 4 (four) times daily. 120 capsule 3  . lisinopril-hydrochlorothiazide (PRINZIDE,ZESTORETIC) 20-12.5 MG tablet Take 1 tablet by mouth daily. 90 tablet 3  . meloxicam (MOBIC) 7.5 MG tablet TAKE 1 TABLET (7.5 MG TOTAL) BY MOUTH DAILY. 30 tablet 0  . metFORMIN (GLUCOPHAGE) 1000 MG tablet Take 1 tablet (1,000 mg total) by mouth 2 (two) times daily with a meal. 180 tablet 1  . simvastatin (ZOCOR) 40 MG tablet Take 1 tablet (40 mg total) by mouth daily. 30 tablet 5  . VIAGRA 25 MG  tablet TAKE 1 TABLET (25 MG TOTAL) BY MOUTH AS NEEDED FOR ERECTILE DYSFUNCTION. 10 tablet 0  . predniSONE (DELTASONE) 10 MG tablet 6 tabs po day 1, 5 tabs po day 2, 4 tabs po day 3, 3 tabs po day 4, 2 tabs po day 5, 1 tab po day 6 (Patient not taking: Reported on 06/04/2018) 21 tablet 0  . sitaGLIPtin (JANUVIA) 50 MG tablet Take 1 tablet (50 mg total) by mouth 2 (two) times daily. (Patient not taking: Reported on 06/04/2018) 90 tablet 1   No current facility-administered medications on file prior to visit.     Past Surgical History:  Procedure Laterality Date  . COLON SURGERY      No Known Allergies  Social History   Socioeconomic History  . Marital status: Divorced    Spouse name: Not on file  . Number of children: Not on file  . Years of education: Not on file  . Highest education level: Not on file  Occupational History  . Not on file  Social Needs  . Financial resource strain: Not on file  . Food insecurity:    Worry: Not on file    Inability: Not on file  . Transportation needs:    Medical: Not on file    Non-medical: Not on file  Tobacco Use  . Smoking status: Never Smoker  . Smokeless tobacco: Never Used  Substance and Sexual Activity  . Alcohol use: No  . Drug  use: No  . Sexual activity: Yes  Lifestyle  . Physical activity:    Days per week: Not on file    Minutes per session: Not on file  . Stress: Not on file  Relationships  . Social connections:    Talks on phone: Not on file    Gets together: Not on file    Attends religious service: Not on file    Active member of club or organization: Not on file    Attends meetings of clubs or organizations: Not on file    Relationship status: Not on file  . Intimate partner violence:    Fear of current or ex partner: Not on file    Emotionally abused: Not on file    Physically abused: Not on file    Forced sexual activity: Not on file  Other Topics Concern  . Not on file  Social History Narrative  . Not on  file    Family History  Problem Relation Age of Onset  . Diabetes Mother   . Hypertension Mother     BP (!) 148/72   Ht 6' (1.829 m)   Wt 239 lb (108.4 kg)   BMI 32.41 kg/m   Review of Systems: See HPI above.     Objective:  Physical Exam:  Gen: NAD, comfortable in exam room  Neck: No gross deformity, swelling, bruising. TTP bilateral paraspinal cervical regions.  No midline/bony TTP. Only 5 degrees extension.  Otherwise FROM with pain on lateral rotations. BUE strength 5/5.   Sensation intact to light touch.   1+ equal reflexes in triceps, biceps, brachioradialis tendons. NV intact distal BUEs.  Right thumb: Catching reproduced at 1st IP joint with palpable nodule A1 pulley.  Left elbow: Small amount of fluid in olecranon bursa. FROM with 5/5 strength. No tenderness to palpation. Collateral ligaments intact. NVI distally.   Assessment & Plan:  1. Neck pain with radiation into extremities - 2/2 radiculopathy, spinal stenosis.  Mildly improved.  Continue physical therapy and home exercises.  Mobic.  S/p prednisone.  F/u in 1 month to 6 weeks.  2. Left olecranon bursitis - encouraged compression with mobic, icing.  Would not inject or aspirate again at this time.    3. Trigger thumb - reviewed splinting.  Not painful.  Consider injection in future if develops pain.

## 2018-06-09 ENCOUNTER — Ambulatory Visit: Payer: Self-pay | Admitting: Physical Therapy

## 2018-06-09 DIAGNOSIS — M542 Cervicalgia: Secondary | ICD-10-CM

## 2018-06-09 DIAGNOSIS — R293 Abnormal posture: Secondary | ICD-10-CM

## 2018-06-09 DIAGNOSIS — R208 Other disturbances of skin sensation: Secondary | ICD-10-CM

## 2018-06-09 NOTE — Therapy (Addendum)
[place], [place], [address] Phone: [phone]   Fax:  [phone]  Physical Therapy Treatment  Patient Details  Name: Kyle Merritt MRN: [id number] Date of Birth: 05-Mar-1961 Referring Provider: Blase Mess    Encounter Date: 06/09/2018  PT End of Session - 06/09/18 0939    Visit Number  5    Number of Visits  13    Date for PT Re-Evaluation  07/02/18    Authorization Type  CAFA expires 08/17/18    Authorization Time Period  05/12/18 to 06/23/18    PT Start Time  0908    PT Stop Time  1005    PT Time Calculation (min)  57 min    Activity Tolerance  Patient tolerated treatment well    Behavior During Therapy  [facility] for tasks assessed/performed       Past Medical History:  Diagnosis Date  . Colon cancer ([place])   . Diabetes mellitus without complication ([place])   . High cholesterol   . History of colon cancer   . Hypertension     Past Surgical History:  Procedure Laterality Date  . COLON SURGERY      There were no vitals filed for this visit.  Subjective Assessment - 06/09/18 0909    Subjective  Pt reports decreased pain and has been performing some of his exercises including chin tucks, SNAGS, and cervical side bending.     Currently in Pain?  Yes    Pain Score  2     Pain Location  Arm    Pain Orientation  Left    Pain Descriptors / Indicators  Throbbing    Pain Type  Chronic pain    Pain Onset  More than a month ago    Pain Frequency  Intermittent                       [facility] Adult PT Treatment/Exercise - 06/09/18 0930      Neck Exercises: Seated   Neck Retraction  10 reps x 2. (second set seated on physioball for core activation)      Shoulder Exercises: Standing   Other Standing Exercises  lower trap lift offs 2 x10 cues for correct posture    Other Standing Exercises  wall angels 2 x 10 cues needed for correct posture      Traction   Min (lbs)  8    Max (lbs)  14     Hold Time  60    Rest Time  10    Time  13      Manual Therapy   Manual Therapy  Passive ROM cervical rotation to L    Manual therapy comments  MTPr along scalenes and upper traps      Neck Exercises: Stretches   Upper Trapezius Stretch  2 reps;30 seconds    Neural Stretch  median nerve glides 2 x 10 bil             PT Education - 06/09/18 1012    Education provided  Yes    Education Details  Reviewed median nerve glides and posture. Educated on benefits of manual therapy    Person(s) Educated  Patient    Methods  Explanation;Verbal cues    Comprehension  Verbalized understanding       PT Short Term Goals - 06/04/18 0844      PT SHORT TERM GOAL #1   Title  Patient to demonstrate  full cervical and thoracic ROM on all planes in order to show improved mobility and mechanics     Status  On-going      PT SHORT TERM GOAL #2   Title  Patient to demonstrate functional B shoulder ER to at least T4 and B shoulder to at least T10 in order to show improved functional shoulder ROM     Baseline  Able to achieve B shoulder ER to T4    Status  Partially Met      PT SHORT TERM GOAL #3   Title  Patient to report pain as being no more than 4/10 at worst in order to improve QOL and functional task tolerance     Baseline  8/10 pain at worst    Status  On-going      PT SHORT TERM GOAL #4   Title  Patient to be able to maintain correct functional posture with external cues no more than 25% of the time in order to reduce pain and restore functional mechanics and movement patterns     Status  On-going        PT Long Term Goals - 06/04/18 0846      PT LONG TERM GOAL #1   Title  Patient to demonstrate functional strength as being 5/5 in all tested muscle groups in order to assist in reducing pain and restoring functional peformance     Status  On-going      PT LONG TERM GOAL #2   Title  Patient to be able to perform overhead tasks such as changing a lightbulb or placing a 10# item  on an overhead shelf without pain exacerbation in order to improve functional task performance     Status  On-going      PT LONG TERM GOAL #3   Title  Patient to be able to perform functional tasks such as dressing and bathing without pain exacerbation in order to show general improvement in condition     Status  On-going      PT LONG TERM GOAL #4   Title  Patient to have returned to work without pain exacerbation in order to show general resolution of symptoms and return to regular activities     Period  Weeks    Status  On-going            Plan - 06/09/18 0955    Clinical Impression Statement  Pt reports continued tingling into LUE. Treatment today focused on manual therapy including cervical rotation PROM to L and MTPr of scalenes and upper trap on L. Following manual therapy, pt displayed increased cervical rotation PROM to L. This may indicate the pt's pain and tingling is related to contractile tissue rather than centrally related. Treatment also focused on neck and shoulder strengthening. Cervical traction also performed this session due to pt's report that this reduces his pain following treatment.     Rehab Potential  Good    PT Frequency  2x / week    PT Duration  4 weeks    PT Treatment/Interventions  ADLs/Self Care Home Management;Biofeedback;Cryotherapy;Electrical Stimulation;Iontophoresis 60m/ml Dexamethasone;Moist Heat;Traction;Ultrasound;DME Instruction;Functional mobility training;Therapeutic activities;Therapeutic exercise;Balance training;Neuromuscular re-education;Patient/family education;Manual techniques;Passive range of motion;Dry needling;Taping    PT Next Visit Plan  Review and update HEP prn, postural training, cervical and shoulder ROM, shoulder strengthening, cervical traction, MTPr upper traps and scalenes prn, lateral gapping    PT Home Exercise Plan  pec stretch, upper trap stretch, cervical retractions, median nerve glides, cervical rotation AROM  Consulted and Agree with Plan of Care  Patient       Patient will benefit from skilled therapeutic intervention in order to improve the following deficits and impairments:  Increased fascial restricitons, Improper body mechanics, Pain, Impaired sensation, Decreased coordination, Increased muscle spasms, Postural dysfunction, Decreased range of motion, Decreased strength, Hypomobility, Impaired UE functional use, Impaired flexibility  Visit Diagnosis: Cervicalgia  Abnormal posture  Other disturbances of skin sensation     Problem List Patient Active Problem List   Diagnosis Date Noted  . Olecranon bursitis of left elbow 06/04/2018  . Trigger finger, acquired 06/04/2018  . Cervical disc disorder with radiculopathy of cervical region 05/08/2018  . Other chronic pain 02/04/2018  . History of cocaine use 02/04/2018  . Hyperlipidemia 02/04/2018  . Colon cancer screening 11/29/2014  . Knee pain, acute 10/04/2014  . Essential hypertension 08/02/2014  . Dental abscess 08/02/2014  . Diabetes ([place]) 04/29/2014  . HTN (hypertension) 04/29/2014  . Dyslipidemia 04/29/2014  . History of colon cancer 04/29/2014  . Back pain 04/29/2014  . Erectile dysfunction 04/29/2014  . Tobacco abuse 07/27/2013  . PURE HYPERCHOLESTEROLEMIA 12/31/2008  . TESTOSTERONE DEFICIENCY 12/10/2008  . GERD 12/08/2008  . ONYCHOMYCOSIS, BILATERAL 11/09/2008  . DIABETES MELLITUS 11/09/2008  . COLON CANCER 12/24/2002    Worthy Flank, SPT 06/09/18 12:00 PM    [place], [place], [address] Phone: [phone]   Fax:  [phone]  Name: Hamilton Marinello MRN: [id number] Date of Birth: [birth year]

## 2018-06-11 ENCOUNTER — Ambulatory Visit: Payer: Self-pay | Admitting: Physical Therapy

## 2018-06-11 DIAGNOSIS — R208 Other disturbances of skin sensation: Secondary | ICD-10-CM

## 2018-06-11 DIAGNOSIS — M542 Cervicalgia: Secondary | ICD-10-CM

## 2018-06-11 DIAGNOSIS — R293 Abnormal posture: Secondary | ICD-10-CM

## 2018-06-11 NOTE — Therapy (Addendum)
[place], [place], [address] Phone: [phone]   Fax:  [phone]  Physical Therapy Treatment  Patient Details  Name: Kyle Merritt MRN: [id number] Date of Birth: [birth year] Referring Provider: Blase Mess    Encounter Date: 06/11/2018  PT End of Session - 06/11/18 0937    Visit Number  6    Number of Visits  13    Date for PT Re-Evaluation  07/02/18    Authorization Type  CAFA expires 08/17/18    Authorization Time Period  05/12/18 to 06/23/18    PT Start Time  0848    PT Stop Time  0930    PT Time Calculation (min)  42 min    Activity Tolerance  Patient tolerated treatment well    Behavior During Therapy  [facility] for tasks assessed/performed       Past Medical History:  Diagnosis Date  . Colon cancer ([place])   . Diabetes mellitus without complication ([place])   . High cholesterol   . History of colon cancer   . Hypertension     Past Surgical History:  Procedure Laterality Date  . COLON SURGERY      There were no vitals filed for this visit.  Subjective Assessment - 06/11/18 0850    Subjective  Pt reports no pain. He has not performed his exercises since his last visit. He states MTPr helped with pain after last session.     Currently in Pain?  No/denies                       [facility] Adult PT Treatment/Exercise - 06/11/18 0909      Neck Exercises: Standing   Other Standing Exercises  clockwise and counter clockwise ball rolls on wall for scapular stabilization and shoulder endurance 2 x 40 seconds bilaterally      Shoulder Exercises: Supine   Protraction  AROM;Strengthening;Both;15 reps x 2 sets with no weight      Shoulder Exercises: Standing   External Rotation  AROM;Strengthening;Both;15 reps;Theraband x 2 sets. Cues for not rotating body    Theraband Level (Shoulder External Rotation)  Level 2 (Red)    Internal Rotation  AROM;Strengthening;Both;15 reps;Theraband x 2 sets.  Cues for not rotating body    Theraband Level (Shoulder Internal Rotation)  Level 2 (Red)    Row  AROM;Both;15 reps x3 sets    Theraband Level (Shoulder Row)  Level 3 (Green)    Other Standing Exercises  lower trap lift offs 2 x10 cues for correct posture and keeping elbow straight    Other Standing Exercises  wall angels 2 x 10 cues needed to keep low back on wall      Manual Therapy   Manual therapy comments  MTPr along R upper trap & levator scap      Neck Exercises: Stretches   Upper Trapezius Stretch  Right;Left;30 seconds;2 reps    Other Neck Stretches  Pec stretch in doorway 4 x 30 seconds             PT Education - 06/11/18 0936    Education provided  Yes    Education Details  reviewed HEP    Person(s) Educated  Patient    Methods  Explanation    Comprehension  Verbalized understanding       PT Short Term Goals - 06/04/18 0844      PT SHORT TERM GOAL #1   Title  Patient to demonstrate full  cervical and thoracic ROM on all planes in order to show improved mobility and mechanics     Status  On-going      PT SHORT TERM GOAL #2   Title  Patient to demonstrate functional B shoulder ER to at least T4 and B shoulder to at least T10 in order to show improved functional shoulder ROM     Baseline  Able to achieve B shoulder ER to T4    Status  Partially Met      PT SHORT TERM GOAL #3   Title  Patient to report pain as being no more than 4/10 at worst in order to improve QOL and functional task tolerance     Baseline  8/10 pain at worst    Status  On-going      PT SHORT TERM GOAL #4   Title  Patient to be able to maintain correct functional posture with external cues no more than 25% of the time in order to reduce pain and restore functional mechanics and movement patterns     Status  On-going        PT Long Term Goals - 06/04/18 0846      PT LONG TERM GOAL #1   Title  Patient to demonstrate functional strength as being 5/5 in all tested muscle groups in order to  assist in reducing pain and restoring functional peformance     Status  On-going      PT LONG TERM GOAL #2   Title  Patient to be able to perform overhead tasks such as changing a lightbulb or placing a 10# item on an overhead shelf without pain exacerbation in order to improve functional task performance     Status  On-going      PT LONG TERM GOAL #3   Title  Patient to be able to perform functional tasks such as dressing and bathing without pain exacerbation in order to show general improvement in condition     Status  On-going      PT LONG TERM GOAL #4   Title  Patient to have returned to work without pain exacerbation in order to show general resolution of symptoms and return to regular activities     Period  Weeks    Status  On-going            Plan - 06/11/18 7412    Clinical Impression Statement  Pt reports no pain upon arrival and minimal tingling into R fingers. He has not performed his exercises since last session. He believes the MTPr helped with pain last time. Treatment today focused on MTPr to R upper trap and levator scap and strengthening and endurance of scapular stabilizers.    PT Treatment/Interventions  ADLs/Self Care Home Management;Biofeedback;Cryotherapy;Electrical Stimulation;Iontophoresis 69m/ml Dexamethasone;Moist Heat;Traction;Ultrasound;DME Instruction;Functional mobility training;Therapeutic activities;Therapeutic exercise;Balance training;Neuromuscular re-education;Patient/family education;Manual techniques;Passive range of motion;Dry needling;Taping    PT Next Visit Plan  Review and update HEP prn, postural training, cervical and shoulder ROM, shoulder strengthening and endurance, cervical traction prn, MTPr upper traps and scalenes prn    PT Home Exercise Plan  pec stretch, upper trap stretch, cervical retractions, median nerve glides, cervical rotation AROM; added lower trap liftoffs and wall angels.     Consulted and Agree with Plan of Care  Patient        Patient will benefit from skilled therapeutic intervention in order to improve the following deficits and impairments:  Increased fascial restricitons, Improper body mechanics, Pain, Impaired sensation, Decreased coordination, Increased  muscle spasms, Postural dysfunction, Decreased range of motion, Decreased strength, Hypomobility, Impaired UE functional use, Impaired flexibility  Visit Diagnosis: Cervicalgia  Abnormal posture  Other disturbances of skin sensation     Problem List Patient Active Problem List   Diagnosis Date Noted  . Olecranon bursitis of left elbow 06/04/2018  . Trigger finger, acquired 06/04/2018  . Cervical disc disorder with radiculopathy of cervical region 05/08/2018  . Other chronic pain 02/04/2018  . History of cocaine use 02/04/2018  . Hyperlipidemia 02/04/2018  . Colon cancer screening 11/29/2014  . Knee pain, acute 10/04/2014  . Essential hypertension 08/02/2014  . Dental abscess 08/02/2014  . Diabetes ([place]) 04/29/2014  . HTN (hypertension) 04/29/2014  . Dyslipidemia 04/29/2014  . History of colon cancer 04/29/2014  . Back pain 04/29/2014  . Erectile dysfunction 04/29/2014  . Tobacco abuse 07/27/2013  . PURE HYPERCHOLESTEROLEMIA 12/31/2008  . TESTOSTERONE DEFICIENCY 12/10/2008  . GERD 12/08/2008  . ONYCHOMYCOSIS, BILATERAL 11/09/2008  . DIABETES MELLITUS 11/09/2008  . COLON CANCER 12/24/2002    Worthy Flank, SPT 06/11/18 9:47 AM   [place], [place], [address] Phone: [phone]   Fax:  [phone]  Name: Kyle Merritt MRN: [id number] Date of Birth: 1961-03-26

## 2018-06-16 ENCOUNTER — Ambulatory Visit: Payer: Self-pay | Admitting: Physical Therapy

## 2018-06-16 DIAGNOSIS — R293 Abnormal posture: Secondary | ICD-10-CM

## 2018-06-16 DIAGNOSIS — R208 Other disturbances of skin sensation: Secondary | ICD-10-CM

## 2018-06-16 DIAGNOSIS — M542 Cervicalgia: Secondary | ICD-10-CM

## 2018-06-16 NOTE — Therapy (Addendum)
[address], [place], [address] Phone: [phone]   Fax:  [phone]  Physical Therapy Treatment  Patient Details  Name: Kyle Merritt MRN: [id number] Date of Birth: [birth year] Referring Provider: Blase Mess    Encounter Date: 06/16/2018  PT End of Session - 06/16/18 0938    Visit Number  7    Number of Visits  13    Date for PT Re-Evaluation  07/02/18    Authorization Type  CAFA expires 08/17/18    Authorization Time Period  05/12/18 to 06/23/18    PT Start Time  0933    PT Stop Time  1014    PT Time Calculation (min)  41 min    Activity Tolerance  Patient tolerated treatment well    Behavior During Therapy  [facility] for tasks assessed/performed       Past Medical History:  Diagnosis Date  . Colon cancer ([place])   . Diabetes mellitus without complication ([place])   . High cholesterol   . History of colon cancer   . Hypertension     Past Surgical History:  Procedure Laterality Date  . COLON SURGERY      There were no vitals filed for this visit.  Subjective Assessment - 06/16/18 0935    Subjective  "I feel good today. I have been doing some of my exercises."    Currently in Pain?  Yes    Pain Score  1     Pain Location  Neck    Pain Orientation  Left    Pain Descriptors / Indicators  Aching    Pain Type  Chronic pain    Pain Onset  More than a month ago    Pain Frequency  Intermittent                       [facility] Adult PT Treatment/Exercise - 06/16/18 2376      Therapeutic Activites    Lifting  25lbs floor to waist, floor to shelf, floor to shelf with lateral step. Use of dowel initially to promote neutral spine alignment.  3 x 10 reps. VC and TC for posture and use of BLE      Neck Exercises: Machines for Strengthening   Cybex Row  45 lbs 2 x 10. cues for technique and correct posture    Lat Pull  35 lbs 2 x 10. cues for technique and correct posture      Neck Exercises:  Standing   Other Standing Exercises  clockwise and counter clockwise ball rolls on wall for scapular stabilization and shoulder endurance to fatigue      Shoulder Exercises: Supine   Protraction  10 reps;AROM;Strengthening;Both x 2 sets      Shoulder Exercises: Seated   Row Weight (lbs)  --    Other Seated Exercises  --      Shoulder Exercises: Standing   External Rotation  AROM;Strengthening;Both;10 reps    Theraband Level (Shoulder External Rotation)  Level 3 (Green)    Internal Rotation  AROM;Strengthening;Both;10 reps    Theraband Level (Shoulder Internal Rotation)  Level 3 (Green)      Neck Exercises: Stretches   Upper Trapezius Stretch  Right;Left;30 seconds;2 reps cues to hold stretch for 30 seconds    Levator Stretch  30 seconds;2 reps;Right;Left             PT Education - 06/16/18 1027    Education provided  Yes  Education Details  Lifting mechanics and proper posture to increase use of BLE and protect back    Person(s) Educated  Patient    Methods  Explanation;Demonstration;Verbal cues;Tactile cues    Comprehension  Verbalized understanding;Returned demonstration;Verbal cues required       PT Short Term Goals - 06/04/18 0844      PT SHORT TERM GOAL #1   Title  Patient to demonstrate full cervical and thoracic ROM on all planes in order to show improved mobility and mechanics     Status  On-going      PT SHORT TERM GOAL #2   Title  Patient to demonstrate functional B shoulder ER to at least T4 and B shoulder to at least T10 in order to show improved functional shoulder ROM     Baseline  Able to achieve B shoulder ER to T4    Status  Partially Met      PT SHORT TERM GOAL #3   Title  Patient to report pain as being no more than 4/10 at worst in order to improve QOL and functional task tolerance     Baseline  8/10 pain at worst    Status  On-going      PT SHORT TERM GOAL #4   Title  Patient to be able to maintain correct functional posture with external  cues no more than 25% of the time in order to reduce pain and restore functional mechanics and movement patterns     Status  On-going        PT Long Term Goals - 06/04/18 0846      PT LONG TERM GOAL #1   Title  Patient to demonstrate functional strength as being 5/5 in all tested muscle groups in order to assist in reducing pain and restoring functional peformance     Status  On-going      PT LONG TERM GOAL #2   Title  Patient to be able to perform overhead tasks such as changing a lightbulb or placing a 10# item on an overhead shelf without pain exacerbation in order to improve functional task performance     Status  On-going      PT LONG TERM GOAL #3   Title  Patient to be able to perform functional tasks such as dressing and bathing without pain exacerbation in order to show general improvement in condition     Status  On-going      PT LONG TERM GOAL #4   Title  Patient to have returned to work without pain exacerbation in order to show general resolution of symptoms and return to regular activities     Period  Weeks    Status  On-going            Plan - 06/16/18 1023    Clinical Impression Statement  Pt reports minimal pain in neck upon arrival and says that he is doing well. Treatment focused on stretching of upper traps and levator scaps and strengthening of shoulder musculature. Also focused greatly on lifting mechanics and posture. Pt demonstrates forward flexed trunk and decreased knee and hip flexion when lifting box from floor to waist. He needs frequent cues to increase flexion of knees and hips and keep back straight.     PT Treatment/Interventions  ADLs/Self Care Home Management;Biofeedback;Cryotherapy;Electrical Stimulation;Iontophoresis 75m/ml Dexamethasone;Moist Heat;Traction;Ultrasound;DME Instruction;Functional mobility training;Therapeutic activities;Therapeutic exercise;Balance training;Neuromuscular re-education;Patient/family education;Manual  techniques;Passive range of motion;Dry needling;Taping    PT Next Visit Plan  review and update HEP prn,  manual therapy to upper traps and scalenes prn, shoulder strengthening, continue to practice lifting mechanics.    PT Home Exercise Plan  pec stretch, upper trap stretch, cervical retractions, median nerve glides, cervical rotation AROM; added lower trap liftoffs and wall angels.     Consulted and Agree with Plan of Care  Patient       Patient will benefit from skilled therapeutic intervention in order to improve the following deficits and impairments:  Increased fascial restricitons, Improper body mechanics, Pain, Impaired sensation, Decreased coordination, Increased muscle spasms, Postural dysfunction, Decreased range of motion, Decreased strength, Hypomobility, Impaired UE functional use, Impaired flexibility  Visit Diagnosis: Cervicalgia  Abnormal posture  Other disturbances of skin sensation     Problem List Patient Active Problem List   Diagnosis Date Noted  . Olecranon bursitis of left elbow 06/04/2018  . Trigger finger, acquired 06/04/2018  . Cervical disc disorder with radiculopathy of cervical region 05/08/2018  . Other chronic pain 02/04/2018  . History of cocaine use 02/04/2018  . Hyperlipidemia 02/04/2018  . Colon cancer screening 11/29/2014  . Knee pain, acute 10/04/2014  . Essential hypertension 08/02/2014  . Dental abscess 08/02/2014  . Diabetes ([place]) 04/29/2014  . HTN (hypertension) 04/29/2014  . Dyslipidemia 04/29/2014  . History of colon cancer 04/29/2014  . Back pain 04/29/2014  . Erectile dysfunction 04/29/2014  . Tobacco abuse 07/27/2013  . PURE HYPERCHOLESTEROLEMIA 12/31/2008  . TESTOSTERONE DEFICIENCY 12/10/2008  . GERD 12/08/2008  . ONYCHOMYCOSIS, BILATERAL 11/09/2008  . DIABETES MELLITUS 11/09/2008  . COLON CANCER 12/24/2002   Worthy Flank, SPT 06/16/18 11:15 AM   [place] [facility] [address] [place], [place], [address] Phone: [phone]   Fax:  [phone]  Name: Kyle Merritt MRN: [id number] Date of Birth: [birth year]

## 2018-06-18 ENCOUNTER — Ambulatory Visit: Payer: Self-pay | Admitting: Physical Therapy

## 2018-06-18 DIAGNOSIS — R293 Abnormal posture: Secondary | ICD-10-CM

## 2018-06-18 DIAGNOSIS — M542 Cervicalgia: Secondary | ICD-10-CM

## 2018-06-18 DIAGNOSIS — R208 Other disturbances of skin sensation: Secondary | ICD-10-CM

## 2018-06-18 NOTE — Therapy (Signed)
[place], [place], [address] Phone: [phone]   Fax:  [phone]  Physical Therapy Treatment/Discharge Summary  Patient Details  Name: Kyle Merritt MRN: [id number] Date of Birth: 08/31/1961 Referring Provider: Blase Mess    Encounter Date: 06/18/2018  PT End of Session - 06/18/18 1017    Visit Number  8    Number of Visits  13    Date for PT Re-Evaluation  07/02/18    Authorization Type  CAFA expires 08/17/18    Authorization Time Period  05/12/18 to 06/23/18    PT Start Time  0930    PT Stop Time  1015    PT Time Calculation (min)  45 min    Activity Tolerance  Patient tolerated treatment well    Behavior During Therapy  [facility] for tasks assessed/performed       Past Medical History:  Diagnosis Date  . Colon cancer ([place])   . Diabetes mellitus without complication ([place])   . High cholesterol   . History of colon cancer   . Hypertension     Past Surgical History:  Procedure Laterality Date  . COLON SURGERY      There were no vitals filed for this visit.  Subjective Assessment - 06/18/18 0936    Subjective  "I feel great today. I don't have numbness or tingling today either. Today is going to be my last visit because I can't make my last two appointments."    Currently in Pain?  No/denies    Pain Score  0-No pain    Pain Onset  More than a month ago         [facility] PT Assessment - 06/18/18 0001      AROM   Right Shoulder Flexion  155 Degrees    Right Shoulder ABduction  145 Degrees    Right Shoulder Internal Rotation  -- L2    Right Shoulder External Rotation  -- T4    Left Shoulder Flexion  154 Degrees    Left Shoulder ABduction  145 Degrees    Left Shoulder Internal Rotation  -- L1    Left Shoulder External Rotation  -- T4    Cervical Flexion  38    Cervical Extension  40    Cervical - Right Side Bend  40    Cervical - Left Side Bend  36 slightly painful, some tingling in L arm     Cervical - Right Rotation  60    Cervical - Left Rotation  55      Strength   Right Shoulder Flexion  5/5    Right Shoulder ABduction  4/5 pain in anterior aspect of shoulder    Right Shoulder Internal Rotation  5/5    Right Shoulder External Rotation  5/5    Left Shoulder Flexion  5/5    Left Shoulder Extension  5/5    Left Shoulder Internal Rotation  4+/5    Left Shoulder External Rotation  5/5    Right Elbow Flexion  5/5    Right Elbow Extension  5/5    Left Elbow Flexion  5/5    Left Elbow Extension  5/5                   [facility] Adult PT Treatment/Exercise - 06/18/18 0001      Therapeutic Activites    Lifting  25lbs floor to waist, floor to shelf. Cues to increase bend in knees and hips and  to stay close to box to decrease torque on back 10 reps. VC and TC for posture and use of BLE      Neck Exercises: Machines for Strengthening   UBE (Upper Arm Bike)  L1 x 6 min      Neck Exercises: Theraband   Rows  15 reps;Blue    Shoulder External Rotation  15 reps;Blue bilateral    Shoulder Internal Rotation  15 reps;Blue bilateral      Shoulder Exercises: Standing   Other Standing Exercises  lower trap lift offs x 10 cues for correct posture    Other Standing Exercises  wall angels x 10 cues needed to keep low back on wall and elbows bent      Neck Exercises: Stretches   Upper Trapezius Stretch  Right;Left;1 rep;30 seconds    Other Neck Stretches  Pec stretch in doorway 2 x 30 seconds cues for correct arm placement             PT Education - 06/18/18 1016    Education provided  Yes    Education Details  HEP review, lifting mechanics, reassessment findings    Person(s) Educated  Patient    Methods  Explanation;Verbal cues;Demonstration    Comprehension  Verbalized understanding       PT Short Term Goals - 06/18/18 0953      PT SHORT TERM GOAL #1   Title  Patient to demonstrate full cervical and thoracic ROM on all planes in order to show improved  mobility and mechanics     Status  Partially Met      PT SHORT TERM GOAL #2   Title  Patient to demonstrate functional B shoulder ER to at least T4 and B shoulder to at least T10 in order to show improved functional shoulder ROM     Baseline  ER to T4, IR to L2 and L1    Status  Partially Met      PT SHORT TERM GOAL #3   Title  Patient to report pain as being no more than 4/10 at worst in order to improve QOL and functional task tolerance     Status  Achieved      PT SHORT TERM GOAL #4   Title  Patient to be able to maintain correct functional posture with external cues no more than 25% of the time in order to reduce pain and restore functional mechanics and movement patterns     Baseline  Pt reports he is sitting with better posture some of the time, but not always    Status  Partially Met        PT Long Term Goals - 06/18/18 0956      PT LONG TERM GOAL #1   Title  Patient to demonstrate functional strength as being 5/5 in all tested muscle groups in order to assist in reducing pain and restoring functional peformance     Baseline  Pt showed improvement in functional strength, but was not all 5/5    Status  Partially Met      PT LONG TERM GOAL #2   Title  Patient to be able to perform overhead tasks such as changing a lightbulb or placing a 10# item on an overhead shelf without pain exacerbation in order to improve functional task performance     Status  Achieved      PT LONG TERM GOAL #3   Title  Patient to be able to perform functional tasks such as  dressing and bathing without pain exacerbation in order to show general improvement in condition     Status  Achieved      PT LONG TERM GOAL #4   Title  Patient to have returned to work without pain exacerbation in order to show general resolution of symptoms and return to regular activities     Status  Achieved            Plan - 06/18/18 1017    Clinical Impression Statement  Pt reporting no pain and no numbness/tingling  today. He states that today will be his last day at PT because he will not be able to make it to his last 2 scheduled visits. Pt reassessed today and found he has improved in all areas of strength and ROM in shoulder and neck. Pt achieved many LTGs, especially regarding decreased pain during activity. However, pt only partially achieved shoulder strength goal due to pain located in anterior R shoulder (most likely pec major tightness). Additionally, he did not completely achieve full cervical ROM, but did show improvement from previous measurements. Treatment focused on HEP review and lifting mechanics.     PT Treatment/Interventions  ADLs/Self Care Home Management;Biofeedback;Cryotherapy;Electrical Stimulation;Iontophoresis 72m/ml Dexamethasone;Moist Heat;Traction;Ultrasound;DME Instruction;Functional mobility training;Therapeutic activities;Therapeutic exercise;Balance training;Neuromuscular re-education;Patient/family education;Manual techniques;Passive range of motion;Dry needling;Taping    PT Home Exercise Plan  upper trap stretch, pec stretch, wall angels, lower trap lift offs, SNAGS, rows & shoulder IR/ER with blue theraband, continue practicing proper lifting mechanics    Consulted and Agree with Plan of Care  Patient       Patient will benefit from skilled therapeutic intervention in order to improve the following deficits and impairments:  Increased fascial restricitons, Improper body mechanics, Pain, Impaired sensation, Decreased coordination, Increased muscle spasms, Postural dysfunction, Decreased range of motion, Decreased strength, Hypomobility, Impaired UE functional use, Impaired flexibility  Visit Diagnosis: Cervicalgia  Abnormal posture  Other disturbances of skin sensation     Problem List Patient Active Problem List   Diagnosis Date Noted  . Olecranon bursitis of left elbow 06/04/2018  . Trigger finger, acquired 06/04/2018  . Cervical disc disorder with radiculopathy of  cervical region 05/08/2018  . Other chronic pain 02/04/2018  . History of cocaine use 02/04/2018  . Hyperlipidemia 02/04/2018  . Colon cancer screening 11/29/2014  . Knee pain, acute 10/04/2014  . Essential hypertension 08/02/2014  . Dental abscess 08/02/2014  . Diabetes ([place] 04/29/2014  . HTN (hypertension) 04/29/2014  . Dyslipidemia 04/29/2014  . History of colon cancer 04/29/2014  . Back pain 04/29/2014  . Erectile dysfunction 04/29/2014  . Tobacco abuse 07/27/2013  . PURE HYPERCHOLESTEROLEMIA 12/31/2008  . TESTOSTERONE DEFICIENCY 12/10/2008  . GERD 12/08/2008  . ONYCHOMYCOSIS, BILATERAL 11/09/2008  . DIABETES MELLITUS 11/09/2008  . COLON CANCER 12/24/2002   MWorthy Flank SPT 06/18/18 10:37 AM   [place] [address]: [phone]  Fax:  [phone] Name: Kyle ValleeMRN: [id number] of Birth: [birth year]                    PHYSICAL THERAPY DISCHARGE SUMMARY  Visits from Start of Care: 8  Current functional level related to goals / functional outcomes: Pt working and able to complete ADLs without pain.    Remaining deficits: Reported intermittent numbness/tingling and stiffness and neck. Pt still occasionally has poor posture habits. See assessment   Education / Equipment: HEP, posture, blue theraband, anatomy, lifting  mechanics Plan: Patient agrees to discharge.  Patient goals were partially met. Patient is being discharged due to being pleased with the current functional level.  ?????

## 2018-06-23 ENCOUNTER — Ambulatory Visit: Payer: Self-pay | Admitting: Physical Therapy

## 2018-06-24 MED FILL — !VIAGRA 25 MG TABLET: 25 | 30 days supply | Qty: 10 | Fill #1

## 2018-06-24 MED FILL — MELOXICAM 7.5 MG TABLET: 7.5 | 30 days supply | Qty: 30 | Fill #1

## 2018-06-24 MED FILL — LISINOPRIL-HCTZ 20-12.5 MG: 20-12.5 | 30 days supply | Qty: 30 | Fill #2

## 2018-06-24 MED FILL — GABAPENTIN 300 MG CAPSULE: 300 | 30 days supply | Qty: 120 | Fill #2

## 2018-06-24 MED FILL — metFORMIN HCL 1000 MG TABS: 1000 | 30 days supply | Qty: 60 | Fill #2

## 2018-06-25 ENCOUNTER — Ambulatory Visit: Payer: Self-pay | Admitting: Physical Therapy

## 2018-07-02 ENCOUNTER — Ambulatory Visit (INDEPENDENT_AMBULATORY_CARE_PROVIDER_SITE_OTHER): Payer: Self-pay | Admitting: Family Medicine

## 2018-07-02 DIAGNOSIS — M6530 Trigger finger, unspecified finger: Secondary | ICD-10-CM

## 2018-07-02 DIAGNOSIS — M7022 Olecranon bursitis, left elbow: Secondary | ICD-10-CM

## 2018-07-02 MED ORDER — METHYLPREDNISOLONE ACETATE 40 MG/ML IJ SUSP
40.0000 mg | Freq: Once | INTRAMUSCULAR | Status: AC
  Administered 2018-07-02: 40 mg via INTRA_ARTICULAR

## 2018-07-02 MED ORDER — METHYLPREDNISOLONE ACETATE 40 MG/ML IJ SUSP
20.0000 mg | Freq: Once | INTRAMUSCULAR | Status: AC
  Administered 2018-07-02: 20 mg via INTRA_ARTICULAR

## 2018-07-02 NOTE — Patient Instructions (Signed)
You have a trigger thumb. We gave you a cortisone injection today for this. Consider a U-shaped aluminum splint if this is still bothering you but it can take 2-3 days for the shot to kick in.  We aspirated/injected your olecranon bursitis. If this still bothers you despite this, let me know and we can refer you to a surgeon but I'd recommend leaving this alone. Compression with ACE wrap to keep the swelling down. Icing if needed 15 minutes at a time 3-4 times a day.

## 2018-07-04 ENCOUNTER — Encounter: Payer: Self-pay | Admitting: Family Medicine

## 2018-07-04 NOTE — Progress Notes (Signed)
PCP: Dorena Dew, FNP  Subjective:   HPI: Patient is a 57 y.o. male here for left elbow, right thumb pain.  5/15: Patient reports about 4-5 months ago he started to get pain in his left wrist that radiated up his arm. Has progressed to include pain in right moreso than left shoulders anteriorly. Right side associated with numbness into right index and middle digits. Moving neck to the left side makes his right arm hurt. Has tried gabapentin. Had an arthritis panel that was negative. He drives trucks for work. Difficulty sleeping due to pain - 9/10 level with throbbing. No bowel/bladder dysfunction.  6/12: Patient reports he's improving from his neck/shoulder pain mildly. He is doing physical therapy (done 3 visits so far) and home exercises. Still with radiation into right > left side with tingling into right index and middle digits. Taking mobic. Recently went to ED for recurrence of left olecranon bursitis - had 5mL drained from this and has no pain, redness, fever. Also with catching of his thumb at IP joint right hand worse in morning but no pain with this.  7/10: Patient reports he's continued to have bogginess, swelling posterior left elbow. Some pain if he hits this but pain is a soreness. Started to get pain with his catching of right thumb. Some swelling at base of thumb. No skin changes, numbness.  Past Medical History:  Diagnosis Date  . Colon cancer ([place])   . Diabetes mellitus without complication ([place])   . High cholesterol   . History of colon cancer   . Hypertension     Current Outpatient Medications on File Prior to Visit  Medication Sig Dispense Refill  . gabapentin (NEURONTIN) 300 MG capsule Take 1 capsule (300 mg total) by mouth 4 (four) times daily. 120 capsule 3  . lisinopril-hydrochlorothiazide (PRINZIDE,ZESTORETIC) 20-12.5 MG tablet Take 1 tablet by mouth daily. 90 tablet 3  . meloxicam (MOBIC) 7.5 MG tablet TAKE 1 TABLET (7.5 MG TOTAL) BY  MOUTH DAILY. 30 tablet 0  . metFORMIN (GLUCOPHAGE) 1000 MG tablet Take 1 tablet (1,000 mg total) by mouth 2 (two) times daily with a meal. 180 tablet 1  . simvastatin (ZOCOR) 40 MG tablet Take 1 tablet (40 mg total) by mouth daily. 30 tablet 5  . VIAGRA 25 MG tablet TAKE 1 TABLET (25 MG TOTAL) BY MOUTH AS NEEDED FOR ERECTILE DYSFUNCTION. 10 tablet 0   No current facility-administered medications on file prior to visit.     Past Surgical History:  Procedure Laterality Date  . COLON SURGERY      No Known Allergies  Social History   Socioeconomic History  . Marital status: Divorced    Spouse name: Not on file  . Number of children: Not on file  . Years of education: Not on file  . Highest education level: Not on file  Occupational History  . Not on file  Social Needs  . Financial resource strain: Not on file  . Food insecurity:    Worry: Not on file    Inability: Not on file  . Transportation needs:    Medical: Not on file    Non-medical: Not on file  Tobacco Use  . Smoking status: Never Smoker  . Smokeless tobacco: Never Used  Substance and Sexual Activity  . Alcohol use: No  . Drug use: No  . Sexual activity: Yes  Lifestyle  . Physical activity:    Days per week: Not on file    Minutes per session:  Not on file  . Stress: Not on file  Relationships  . Social connections:    Talks on phone: Not on file    Gets together: Not on file    Attends religious service: Not on file    Active member of club or organization: Not on file    Attends meetings of clubs or organizations: Not on file    Relationship status: Not on file  . Intimate partner violence:    Fear of current or ex partner: Not on file    Emotionally abused: Not on file    Physically abused: Not on file    Forced sexual activity: Not on file  Other Topics Concern  . Not on file  Social History Narrative  . Not on file    Family History  Problem Relation Age of Onset  . Diabetes Mother   .  Hypertension Mother     BP (!) 152/92   Ht 6' (1.829 m)   Wt 239 lb (108.4 kg)   BMI 32.41 kg/m   Review of Systems: See HPI above.     Objective:  Physical Exam:  Gen: NAD, comfortable in exam room  Right thumb: Catching and locking at IP joint with flexion.  No malrotation or angulation. FROM with 5/5 strength. TTP A1 pulley. NVI distally.  Left elbow: Small amount of fluid in olecranon bursa. No other deformity. FROM with 5/5 strength. No tenderness to palpation. NVI distally.   Assessment & Plan:  1. Left olecranon bursitis - he requested aspiration and injection.  Only 62mL sanguinous fluid aspirated.  No concerns for infection.  Compression, icing, meloxicam if needed.  After informed written consent patient was seated on exam table and timeout was performed.  Area overlying left olecranon bursa prepped with alcohol swab, 51mL marcaine used for local anesthesia.  Using 18g needle on 26mL syringe, 41mL sanguinous fluid aspirated from left olecranon bursa followed by injection with 43mL depomedrol.  Patient tolerated procedure well without immediate complications.  2. Trigger thumb - increased pain with his catching.  Injection given today.  Discussed splinting.  Meloxicam, ice/heat if needed.  After informed written consent patient was seated on exam table and timeout was performed.  Area overlying right thumb A1 pulley prepped with alcohol swab then injected with 0.5:0.45mL marcaine: depomedrol.  Patient tolerated procedure well without immediate complications.

## 2018-07-05 NOTE — Assessment & Plan Note (Signed)
he requested aspiration and injection.  Only 81mL sanguinous fluid aspirated.  No concerns for infection.  Compression, icing, meloxicam if needed.  After informed written consent patient was seated on exam table and timeout was performed.  Area overlying left olecranon bursa prepped with alcohol swab, 56mL marcaine used for local anesthesia.  Using 18g needle on 86mL syringe, 94mL sanguinous fluid aspirated from left olecranon bursa followed by injection with 62mL depomedrol.  Patient tolerated procedure well without immediate complications.

## 2018-07-05 NOTE — Assessment & Plan Note (Signed)
Trigger thumb - increased pain with his catching.  Injection given today.  Discussed splinting.  Meloxicam, ice/heat if needed.  After informed written consent patient was seated on exam table and timeout was performed.  Area overlying right thumb A1 pulley prepped with alcohol swab then injected with 0.5:0.45mL marcaine: depomedrol.  Patient tolerated procedure well without immediate complications.

## 2018-07-23 DIAGNOSIS — H35033 Hypertensive retinopathy, bilateral: Secondary | ICD-10-CM | POA: Diagnosis not present

## 2018-07-23 DIAGNOSIS — H04123 Dry eye syndrome of bilateral lacrimal glands: Secondary | ICD-10-CM | POA: Diagnosis not present

## 2018-07-23 DIAGNOSIS — H524 Presbyopia: Secondary | ICD-10-CM | POA: Diagnosis not present

## 2018-07-23 DIAGNOSIS — H25813 Combined forms of age-related cataract, bilateral: Secondary | ICD-10-CM | POA: Diagnosis not present

## 2018-07-24 DIAGNOSIS — H5213 Myopia, bilateral: Secondary | ICD-10-CM | POA: Diagnosis not present

## 2018-07-24 MED FILL — GABAPENTIN 300 MG CAPSULE: 300 | 30 days supply | Qty: 120 | Fill #3

## 2018-07-24 MED FILL — !VIAGRA 25 MG TABLET: 25 | 30 days supply | Qty: 10 | Fill #2

## 2018-07-24 MED FILL — LISINOPRIL-HCTZ 20-12.5 MG: 20-12.5 | 30 days supply | Qty: 30 | Fill #3

## 2018-07-24 MED FILL — SIMVASTATIN 40 MG TABLET: 40 | 30 days supply | Qty: 30 | Fill #4

## 2018-07-24 MED FILL — metFORMIN HCL 1000 MG TABS: 1000 | 30 days supply | Qty: 60 | Fill #3

## 2018-07-30 ENCOUNTER — Ambulatory Visit (INDEPENDENT_AMBULATORY_CARE_PROVIDER_SITE_OTHER): Payer: Medicaid Other | Admitting: Family Medicine

## 2018-07-30 ENCOUNTER — Encounter: Payer: Self-pay | Admitting: Family Medicine

## 2018-07-30 VITALS — BP 147/92 | HR 92 | Temp 98.5°F | Resp 16 | Ht 72.0 in | Wt 241.0 lb

## 2018-07-30 DIAGNOSIS — C189 Malignant neoplasm of colon, unspecified: Secondary | ICD-10-CM | POA: Diagnosis not present

## 2018-07-30 DIAGNOSIS — Z125 Encounter for screening for malignant neoplasm of prostate: Secondary | ICD-10-CM | POA: Diagnosis not present

## 2018-07-30 DIAGNOSIS — I10 Essential (primary) hypertension: Secondary | ICD-10-CM

## 2018-07-30 DIAGNOSIS — E785 Hyperlipidemia, unspecified: Secondary | ICD-10-CM | POA: Diagnosis not present

## 2018-07-30 DIAGNOSIS — G8929 Other chronic pain: Secondary | ICD-10-CM

## 2018-07-30 DIAGNOSIS — E119 Type 2 diabetes mellitus without complications: Secondary | ICD-10-CM | POA: Diagnosis not present

## 2018-07-30 LAB — POCT GLYCOSYLATED HEMOGLOBIN (HGB A1C): Hemoglobin A1C: 8.9 % — AB (ref 4.0–5.6)

## 2018-07-30 LAB — POCT URINALYSIS DIPSTICK
Bilirubin, UA: NEGATIVE
Blood, UA: NEGATIVE
Glucose, UA: NEGATIVE
Ketones, UA: NEGATIVE
Nitrite, UA: NEGATIVE
Protein, UA: NEGATIVE
Spec Grav, UA: 1.02 (ref 1.010–1.025)
Urobilinogen, UA: 0.2 E.U./dL
pH, UA: 5 (ref 5.0–8.0)

## 2018-07-30 MED ORDER — LISINOPRIL-HYDROCHLOROTHIAZIDE 20-12.5 MG PO TABS: 1.0000 | ORAL_TABLET | Freq: Every day | ORAL | 3 refills | Status: DC

## 2018-07-30 MED ORDER — SILDENAFIL CITRATE 100 MG PO TABS: 50.0000 mg | ORAL_TABLET | Freq: Every day | ORAL | 11 refills | Status: DC | PRN

## 2018-07-30 MED ORDER — METFORMIN HCL 1000 MG PO TABS: 1000.0000 mg | ORAL_TABLET | Freq: Two times a day (BID) | ORAL | 1 refills | Status: DC

## 2018-07-30 MED ORDER — MELOXICAM 7.5 MG PO TABS: 7.5000 mg | ORAL_TABLET | Freq: Every day | ORAL | 0 refills | Status: DC

## 2018-07-30 MED ORDER — GABAPENTIN 300 MG PO CAPS: 300.0000 mg | ORAL_CAPSULE | Freq: Four times a day (QID) | ORAL | 3 refills | Status: DC

## 2018-07-30 MED ORDER — SIMVASTATIN 40 MG PO TABS: 40.0000 mg | ORAL_TABLET | Freq: Every day | ORAL | 5 refills | Status: DC

## 2018-07-30 NOTE — Patient Instructions (Signed)
Hypertension Hypertension is another name for high blood pressure. High blood pressure forces your heart to work harder to pump blood. This can cause problems over time. There are two numbers in a blood pressure reading. There is a top number (systolic) over a bottom number (diastolic). It is best to have a blood pressure below 120/80. Healthy choices can help lower your blood pressure. You may need medicine to help lower your blood pressure if:  Your blood pressure cannot be lowered with healthy choices.  Your blood pressure is higher than 130/80.  Follow these instructions at home: Eating and drinking  If directed, follow the DASH eating plan. This diet includes: ? Filling half of your plate at each meal with fruits and vegetables. ? Filling one quarter of your plate at each meal with whole grains. Whole grains include whole wheat pasta, brown rice, and whole grain bread. ? Eating or drinking low-fat dairy products, such as skim milk or low-fat yogurt. ? Filling one quarter of your plate at each meal with low-fat (lean) proteins. Low-fat proteins include fish, skinless chicken, eggs, beans, and tofu. ? Avoiding fatty meat, cured and processed meat, or chicken with skin. ? Avoiding premade or processed food.  Eat less than 1,500 mg of salt (sodium) a day.  Limit alcohol use to no more than 1 drink a day for nonpregnant women and 2 drinks a day for men. One drink equals 12 oz of beer, 5 oz of wine, or 1 oz of hard liquor. Lifestyle  Work with your doctor to stay at a healthy weight or to lose weight. Ask your doctor what the best weight is for you.  Get at least 30 minutes of exercise that causes your heart to beat faster (aerobic exercise) most days of the week. This may include walking, swimming, or biking.  Get at least 30 minutes of exercise that strengthens your muscles (resistance exercise) at least 3 days a week. This may include lifting weights or pilates.  Do not use any  products that contain nicotine or tobacco. This includes cigarettes and e-cigarettes. If you need help quitting, ask your doctor.  Check your blood pressure at home as told by your doctor.  Keep all follow-up visits as told by your doctor. This is important. Medicines  Take over-the-counter and prescription medicines only as told by your doctor. Follow directions carefully.  Do not skip doses of blood pressure medicine. The medicine does not work as well if you skip doses. Skipping doses also puts you at risk for problems.  Ask your doctor about side effects or reactions to medicines that you should watch for. Contact a doctor if:  You think you are having a reaction to the medicine you are taking.  You have headaches that keep coming back (recurring).  You feel dizzy.  You have swelling in your ankles.  You have trouble with your vision. Get help right away if:  You get a very bad headache.  You start to feel confused.  You feel weak or numb.  You feel faint.  You get very bad pain in your: ? Chest. ? Belly (abdomen).  You throw up (vomit) more than once.  You have trouble breathing. Summary  Hypertension is another name for high blood pressure.  Making healthy choices can help lower blood pressure. If your blood pressure cannot be controlled with healthy choices, you may need to take medicine. This information is not intended to replace advice given to you by your health care   provider. Make sure you discuss any questions you have with your health care provider. Document Released: 05/28/2008 Document Revised: 11/07/2016 Document Reviewed: 11/07/2016 Elsevier Interactive Patient Education  2018 [employer]. Diabetes Mellitus and Exercise Exercising regularly is important for your overall health, especially when you have diabetes (diabetes mellitus). Exercising is not only about losing weight. It has many health benefits, such as increasing muscle strength and bone  density and reducing body fat and stress. This leads to improved fitness, flexibility, and endurance, all of which result in better overall health. Exercise has additional benefits for people with diabetes, including:  Reducing appetite.  Helping to lower and control blood glucose.  Lowering blood pressure.  Helping to control amounts of fatty substances (lipids) in the blood, such as cholesterol and triglycerides.  Helping the body to respond better to insulin (improving insulin sensitivity).  Reducing how much insulin the body needs.  Decreasing the risk for heart disease by: ? Lowering cholesterol and triglyceride levels. ? Increasing the levels of good cholesterol. ? Lowering blood glucose levels.  What is my activity plan? Your health care provider or certified diabetes educator can help you make a plan for the type and frequency of exercise (activity plan) that works for you. Make sure that you:  Do at least 150 minutes of moderate-intensity or vigorous-intensity exercise each week. This could be brisk walking, biking, or water aerobics. ? Do stretching and strength exercises, such as yoga or weightlifting, at least 2 times a week. ? Spread out your activity over at least 3 days of the week.  Get some form of physical activity every day. ? Do not go more than 2 days in a row without some kind of physical activity. ? Avoid being inactive for more than 90 minutes at a time. Take frequent breaks to walk or stretch.  Choose a type of exercise or activity that you enjoy, and set realistic goals.  Start slowly, and gradually increase the intensity of your exercise over time.  What do I need to know about managing my diabetes?  Check your blood glucose before and after exercising. ? If your blood glucose is higher than 240 mg/dL (13.3 mmol/L) before you exercise, check your urine for ketones. If you have ketones in your urine, do not exercise until your blood glucose returns to  normal.  Know the symptoms of low blood glucose (hypoglycemia) and how to treat it. Your risk for hypoglycemia increases during and after exercise. Common symptoms of hypoglycemia can include: ? Hunger. ? Anxiety. ? Sweating and feeling clammy. ? Confusion. ? Dizziness or feeling light-headed. ? Increased heart rate or palpitations. ? Blurry vision. ? Tingling or numbness around the mouth, lips, or tongue. ? Tremors or shakes. ? Irritability.  Keep a rapid-acting carbohydrate snack available before, during, and after exercise to help prevent or treat hypoglycemia.  Avoid injecting insulin into areas of the body that are going to be exercised. For example, avoid injecting insulin into: ? The arms, when playing tennis. ? The legs, when jogging.  Keep records of your exercise habits. Doing this can help you and your health care provider adjust your diabetes management plan as needed. Write down: ? Food that you eat before and after you exercise. ? Blood glucose levels before and after you exercise. ? The type and amount of exercise you have done. ? When your insulin is expected to peak, if you use insulin. Avoid exercising at times when your insulin is peaking.  When you start  a new exercise or activity, work with your health care provider to make sure the activity is safe for you, and to adjust your insulin, medicines, or food intake as needed.  Drink plenty of water while you exercise to prevent dehydration or heat stroke. Drink enough fluid to keep your urine clear or pale yellow. This information is not intended to replace advice given to you by your health care provider. Make sure you discuss any questions you have with your health care provider. Document Released: 03/01/2004 Document Revised: 06/29/2016 Document Reviewed: 05/21/2016 Elsevier Interactive Patient Education  2018 [employer].

## 2018-07-30 NOTE — Progress Notes (Signed)
SUBJECTIVE:  Patient here for follow-up of hypertension.  He is not exercising and is not adherent to a low-salt, ADA, or carbohydrate modified eating lifestyle . Currently in [facility] for rehab of drugs and alcohol. Patient states that he has to eat what is provided most days.  Blood pressure not checked at home.   He is compliant with medications.  Home blood sugar records: does not check.   Diabetic Labs Latest Ref Rng & Units 07/30/2018 04/28/2018 03/20/2018  HbA1c 4.0 - 5.6 % 8.9(A) 12.2 -  Microalbumin 0.00 - 1.89 mg/dL - - -  Micro/Creat Ratio 0.0 - 30.0 mg/g - - -  Chol 100 - 199 mg/dL - - -  HDL >39 mg/dL - - -  Calc LDL 0 - 99 mg/dL - - -  Triglycerides 0 - 149 mg/dL - - -  Creatinine 0.76 - 1.27 mg/dL - 1.38(H) 1.20   Foot/eye exam completion dates 04/28/2018 01/29/2018  Foot Form Completion Done Done    Lab Results  Component Value Date   POCGLU 287.0 (A) 05/09/2015    Diabetes Health Maintenance Due  Topic Date Due  . OPHTHALMOLOGY EXAM  [birth year]/09/1969  . HEMOGLOBIN A1C  10/29/2018  . FOOT EXAM  04/29/2019   Pneumococcal Immunization Status  Topic Date Due  . PNEUMOCOCCAL POLYSACCHARIDE VACCINE  Completed   Past Medical History:  Diagnosis Date  . Colon cancer ([place])   . Diabetes mellitus without complication ([place])   . High cholesterol   . History of colon cancer   . Hypertension     Social History   Socioeconomic History  . Marital status: Divorced    Spouse name: Not on file  . Number of children: Not on file  . Years of education: Not on file  . Highest education level: Not on file  Occupational History  . Not on file  Social Needs  . Financial resource strain: Not on file  . Food insecurity:    Worry: Not on file    Inability: Not on file  . Transportation needs:    Medical: Not on file    Non-medical: Not on file  Tobacco Use  . Smoking status: Never Smoker  . Smokeless tobacco: Never Used  Substance and Sexual Activity  . Alcohol use: No   . Drug use: No  . Sexual activity: Yes  Lifestyle  . Physical activity:    Days per week: Not on file    Minutes per session: Not on file  . Stress: Not on file  Relationships  . Social connections:    Talks on phone: Not on file    Gets together: Not on file    Attends religious service: Not on file    Active member of club or organization: Not on file    Attends meetings of clubs or organizations: Not on file    Relationship status: Not on file  . Intimate partner violence:    Fear of current or ex partner: Not on file    Emotionally abused: Not on file    Physically abused: Not on file    Forced sexual activity: Not on file  Other Topics Concern  . Not on file  Social History Narrative  . Not on file    No Known Allergies   Review of Systems  Constitutional: Negative.   HENT: Negative.   Eyes: Negative.   Respiratory: Negative.   Cardiovascular: Negative.   Gastrointestinal: Negative.   Genitourinary: Negative.   Musculoskeletal: Negative.  Skin: Negative.   Neurological: Negative.   Psychiatric/Behavioral: Negative.     OBJECTIVE:   BP (!) 147/92 (BP Location: Right Arm, Patient Position: Sitting, Cuff Size: Normal)   Pulse 92   Temp 98.5 F (36.9 C) (Oral)   Resp 16   Ht 6' (1.829 m)   Wt 241 lb (109.3 kg)   SpO2 100%   BMI 32.69 kg/m    Physical Exam  Constitutional: He is oriented to person, place, and time and well-developed, well-nourished, and in no distress. No distress.  HENT:  Head: Normocephalic and atraumatic.  Eyes: Pupils are equal, round, and reactive to light. Conjunctivae and EOM are normal.  Neck: Normal range of motion. Neck supple.  Cardiovascular: Normal rate, regular rhythm and intact distal pulses. Exam reveals no gallop and no friction rub.  No murmur heard. Pulmonary/Chest: Effort normal and breath sounds normal. No respiratory distress. He has no wheezes.  Abdominal: Soft. Bowel sounds are normal. There is no tenderness.   Musculoskeletal: Normal range of motion. He exhibits no edema or tenderness.  Lymphadenopathy:    He has no cervical adenopathy.  Neurological: He is alert and oriented to person, place, and time. Gait normal.  Skin: Skin is warm and dry.  Psychiatric: Mood, memory, affect and judgment normal.  Nursing note and vitals reviewed.    ASSESSMENT/ PLAN:  1. Hypertension, unspecified type The current medical regimen is effective;  continue present plan and medications.  - Urinalysis Dipstick - CBC With Differential - Comprehensive metabolic panel  2. Type 2 diabetes mellitus without complication, without long-term current use of insulin (HCC) A1c decreased from 12.2 to 8.9. The current medical regimen is effective;  continue present plan and medications. Discussed diet and exercise and a carb modified diet.  - HgB A1c - Lipid Panel With LDL/HDL Ratio; Future - metFORMIN (GLUCOPHAGE) 1000 MG tablet; Take 1 tablet (1,000 mg total) by mouth 2 (two) times daily with a meal.  Dispense: 180 tablet; Refill: 1 - Ambulatory referral to Ophthalmology  3. Malignant neoplasm of colon, unspecified part of colon ([place]) Hx of colon cancer. Needs to have colonoscopy Q5 years. Last on in 2013 per patient.  - Ambulatory referral to Gastroenterology  4. Screening for prostate cancer - PSA, total and free  5. Hyperlipidemia, unspecified hyperlipidemia type - simvastatin (ZOCOR) 40 MG tablet; Take 1 tablet (40 mg total) by mouth daily.  Dispense: 30 tablet; Refill: 5 Future lab ordered. Patient is not fasting.  6. Other chronic pain The current medical regimen is effective;  continue present plan and medications. - meloxicam (MOBIC) 7.5 MG tablet; Take 1 tablet (7.5 mg total) by mouth daily.  Dispense: 30 tablet; Refill: 0 - gabapentin (NEURONTIN) 300 MG capsule; Take 1 capsule (300 mg total) by mouth 4 (four) times daily.  Dispense: 120 capsule; Refill: 3  7. Essential hypertension The current  medical regimen is effective;  continue present plan and medications.  - lisinopril-hydrochlorothiazide (PRINZIDE,ZESTORETIC) 20-12.5 MG tablet; Take 1 tablet by mouth daily.  Dispense: 90 tablet; Refill: 3  Return in about 3 months (around 10/30/2018).   The patient was given clear instructions to go to ER or return to medical center if symptoms do not improve, worsen or new problems develop. The patient verbalized understanding and agreed with plan of care.   Ms. [name]. Nathaneil Canary, FNP-BC Patient [facility] [address] [place], [place] [address] [phone]

## 2018-07-31 LAB — COMPREHENSIVE METABOLIC PANEL
ALT: 13 IU/L (ref 0–44)
AST: 17 IU/L (ref 0–40)
Albumin/Globulin Ratio: 1.5 (ref 1.2–2.2)
Albumin: 4.4 g/dL (ref 3.5–5.5)
Alkaline Phosphatase: 84 IU/L (ref 39–117)
BUN/Creatinine Ratio: 18 (ref 9–20)
BUN: 27 mg/dL — ABNORMAL HIGH (ref 6–24)
Bilirubin Total: 0.4 mg/dL (ref 0.0–1.2)
CO2: 21 mmol/L (ref 20–29)
Calcium: 9.7 mg/dL (ref 8.7–10.2)
Chloride: 104 mmol/L (ref 96–106)
Creatinine, Ser: 1.54 mg/dL — ABNORMAL HIGH (ref 0.76–1.27)
GFR calc Af Amer: 56 mL/min/{1.73_m2} — ABNORMAL LOW (ref >59)
GFR calc non Af Amer: 49 mL/min/{1.73_m2} — ABNORMAL LOW (ref >59)
Globulin, Total: 3 g/dL (ref 1.5–4.5)
Glucose: 157 mg/dL — ABNORMAL HIGH (ref 65–99)
Potassium: 4.6 mmol/L (ref 3.5–5.2)
Sodium: 142 mmol/L (ref 134–144)
Total Protein: 7.4 g/dL (ref 6.0–8.5)

## 2018-07-31 LAB — PSA, TOTAL AND FREE
PSA, Free Pct: 15.8 %
PSA, Free: 0.19 ng/mL
Prostate Specific Ag, Serum: 1.2 ng/mL (ref 0.0–4.0)

## 2018-07-31 LAB — CBC WITH DIFFERENTIAL
Basophils Absolute: 0 10*3/uL (ref 0.0–0.2)
Basos: 1 %
EOS (ABSOLUTE): 0.2 10*3/uL (ref 0.0–0.4)
Eos: 3 %
Hematocrit: 36 % — ABNORMAL LOW (ref 37.5–51.0)
Hemoglobin: 11.7 g/dL — ABNORMAL LOW (ref 13.0–17.7)
Immature Grans (Abs): 0 10*3/uL (ref 0.0–0.1)
Immature Granulocytes: 0 %
Lymphocytes Absolute: 2.5 10*3/uL (ref 0.7–3.1)
Lymphs: 39 %
MCH: 25.8 pg — ABNORMAL LOW (ref 26.6–33.0)
MCHC: 32.5 g/dL (ref 31.5–35.7)
MCV: 80 fL (ref 79–97)
Monocytes Absolute: 0.3 10*3/uL (ref 0.1–0.9)
Monocytes: 5 %
Neutrophils Absolute: 3.3 10*3/uL (ref 1.4–7.0)
Neutrophils: 52 %
RBC: 4.53 x10E6/uL (ref 4.14–5.80)
RDW: 15.5 % — ABNORMAL HIGH (ref 12.3–15.4)
WBC: 6.4 10*3/uL (ref 3.4–10.8)

## 2018-08-13 ENCOUNTER — Telehealth: Payer: Self-pay

## 2018-08-13 ENCOUNTER — Telehealth: Payer: Self-pay | Admitting: Gastroenterology

## 2018-08-13 NOTE — Telephone Encounter (Signed)
Recv'd medical records from [facility] forwarded 49 pages to [facility] 08/13/18 fbg

## 2018-08-13 NOTE — Telephone Encounter (Signed)
Faxed ROI to [facility] 8/15 & 8/21 fbg.

## 2018-08-14 MED FILL — MELOXICAM 7.5 MG TABLET: 7.5 | 30 days supply | Qty: 30 | Fill #0

## 2018-08-15 ENCOUNTER — Telehealth: Payer: Self-pay

## 2018-08-15 MED FILL — metFORMIN HCL 1000 MG TABS: 1000 | 30 days supply | Qty: 60 | Fill #4

## 2018-08-15 MED FILL — LISINOPRIL-HCTZ 20-12.5 MG: 20-12.5 | 90 days supply | Qty: 90 | Fill #4

## 2018-08-15 MED FILL — SIMVASTATIN 40 MG TABLET: 40 | 30 days supply | Qty: 30 | Fill #5

## 2018-08-15 NOTE — Telephone Encounter (Signed)
The only number on file is for patient's job. I have called, he was not available. I have left a message for him to return my call. Thanks!

## 2018-08-18 ENCOUNTER — Ambulatory Visit (INDEPENDENT_AMBULATORY_CARE_PROVIDER_SITE_OTHER): Payer: Medicaid Other | Admitting: Family Medicine

## 2018-08-18 ENCOUNTER — Encounter: Payer: Self-pay | Admitting: Family Medicine

## 2018-08-18 VITALS — BP 142/99 | HR 76 | Temp 98.0°F | Resp 14 | Ht 72.0 in | Wt 246.0 lb

## 2018-08-18 DIAGNOSIS — L03031 Cellulitis of right toe: Secondary | ICD-10-CM | POA: Diagnosis not present

## 2018-08-18 MED ORDER — CEPHALEXIN 500 MG PO CAPS: 500.0000 mg | ORAL_CAPSULE | Freq: Two times a day (BID) | ORAL | 0 refills | Status: AC

## 2018-08-18 MED FILL — !VIAGRA 25 MG TABLET: 25 | 30 days supply | Qty: 20 | Fill #0

## 2018-08-18 MED FILL — CEPHALEXIN 500 MG CAPSULE: 500 | 10 days supply | Qty: 20 | Fill #0

## 2018-08-18 NOTE — Telephone Encounter (Signed)
Patient returned call and advised me that he has had an eye exam and they are going to fax over report to our office. Thanks!

## 2018-08-18 NOTE — Patient Instructions (Signed)
I sent an antibiotic to the pharmacy. Please complete the antibiotic. Remember to not cut your toenails since you are diabetic.     Paronychia Paronychia is an infection of the skin. It happens near a fingernail or toenail. It may cause pain and swelling around the nail. Usually, it is not serious and it clears up with treatment. Follow these instructions at home:  Soak the fingers or toes in warm water as told by your doctor. You may be told to do this for 20 minutes, 2-3 times a day.  Keep the area dry when you are not soaking it.  Take medicines only as told by your doctor.  If you were given an antibiotic medicine, finish all of it even if you start to feel better.  Keep the affected area clean.  Do not try to drain a fluid-filled bump yourself.  Wear rubber gloves when putting your hands in water.  Wear gloves if your hands might touch cleaners or chemicals.  Follow your doctor's instructions about: ? Wound care. ? Bandage (dressing) changes and removal. Contact a doctor if:  Your symptoms get worse or do not improve.  You have a fever or chills.  You have redness spreading from the affected area.  You have more fluid, blood, or pus coming from the affected area.  Your finger or knuckle is swollen or is hard to move. This information is not intended to replace advice given to you by your health care provider. Make sure you discuss any questions you have with your health care provider. Document Released: 11/28/2009 Document Revised: 05/17/2016 Document Reviewed: 11/17/2014 Elsevier Interactive Patient Education  [employer].

## 2018-08-18 NOTE — Progress Notes (Signed)
  Patient [facility]   Progress Note: General Provider: Lanae Boast, FNP  SUBJECTIVE:   Kyle Merritt is a 57 y.o. male who  has a past medical history of Colon cancer ([place]), Diabetes mellitus without complication ([place]), High cholesterol, History of colon cancer, and Hypertension.. Patient presents today for Toe Injury (right foot 2nd toe sore )  Patient states that he cut his toes nail and got too close.  Review of Systems  Constitutional: Negative.   Skin:       Infected toe nail.   Neurological: Negative.   Psychiatric/Behavioral: Negative.      OBJECTIVE: BP (!) 142/99 (BP Location: Right Arm, Patient Position: Sitting, Cuff Size: Large)   Pulse 76   Temp 98 F (36.7 C) (Oral)   Resp 14   Ht 6' (1.829 m)   Wt 246 lb (111.6 kg)   SpO2 100%   BMI 33.36 kg/m   Physical Exam  Constitutional: He is oriented to person, place, and time. He appears well-developed and well-nourished. No distress.  HENT:  Head: Normocephalic and atraumatic.  Musculoskeletal: Normal range of motion.  Feet:  Right Foot:  Skin Integrity: Positive for skin breakdown (there is yellow exudate noted to the second toe on the right foot. the nail is thick  and intact. There is also TTP and bleeding noted. ), callus and dry skin. Negative for erythema or warmth.  Neurological: He is alert and oriented to person, place, and time.  Psychiatric: He has a normal mood and affect. His behavior is normal. Judgment and thought content normal.    ASSESSMENT/PLAN:   1. Paronychia of second toe of right foot Instructed patient to not cut his own toenails since he is diabetic. Cover with bacitracin and Band-Aid. Can soak in warm water.  - cephALEXin (KEFLEX) 500 MG capsule; Take 1 capsule (500 mg total) by mouth 2 (two) times daily for 10 days.  Dispense: 20 capsule; Refill: 0        The patient was given clear instructions to go to ER or return to medical center if  symptoms do not improve, worsen or new problems develop. The patient verbalized understanding and agreed with plan of care.   Ms. [name]. Nathaneil Canary, FNP-BC Patient [facility] [address] [place], [place] [address] [phone]     This note has been created with Dragon speech recognition software and smart phrase technology. Any transcriptional errors are unintentional.

## 2018-08-19 ENCOUNTER — Encounter: Payer: Self-pay | Admitting: Gastroenterology

## 2018-08-29 DIAGNOSIS — H524 Presbyopia: Secondary | ICD-10-CM | POA: Diagnosis not present

## 2018-09-05 IMAGING — DX DG ELBOW COMPLETE 3+V*L*
4 series · 4 of 4 positions shown · non-contrast
Comparison: None.

CLINICAL DATA: LEFT elbow swelling and pain, starting yesterday. No
known injury.

EXAM:
LEFT ELBOW - COMPLETE 3+ VIEW

[elbow ap]
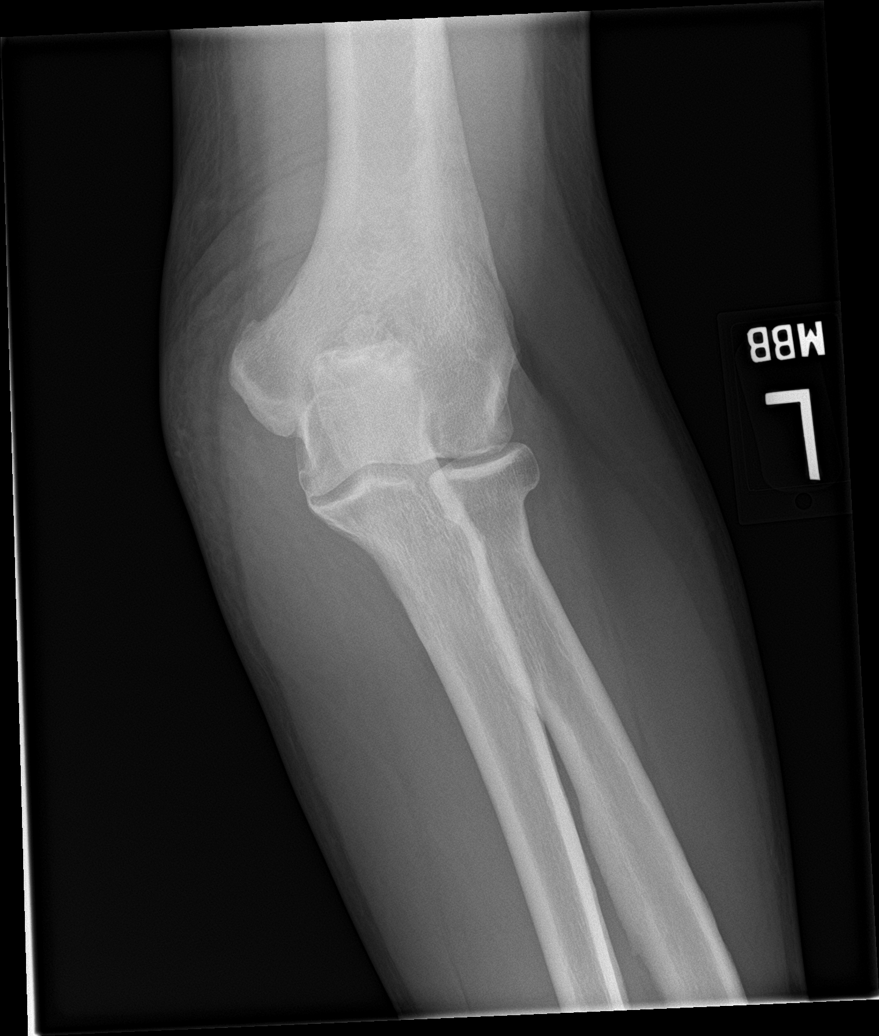

[elbow obl (1 of 2)]
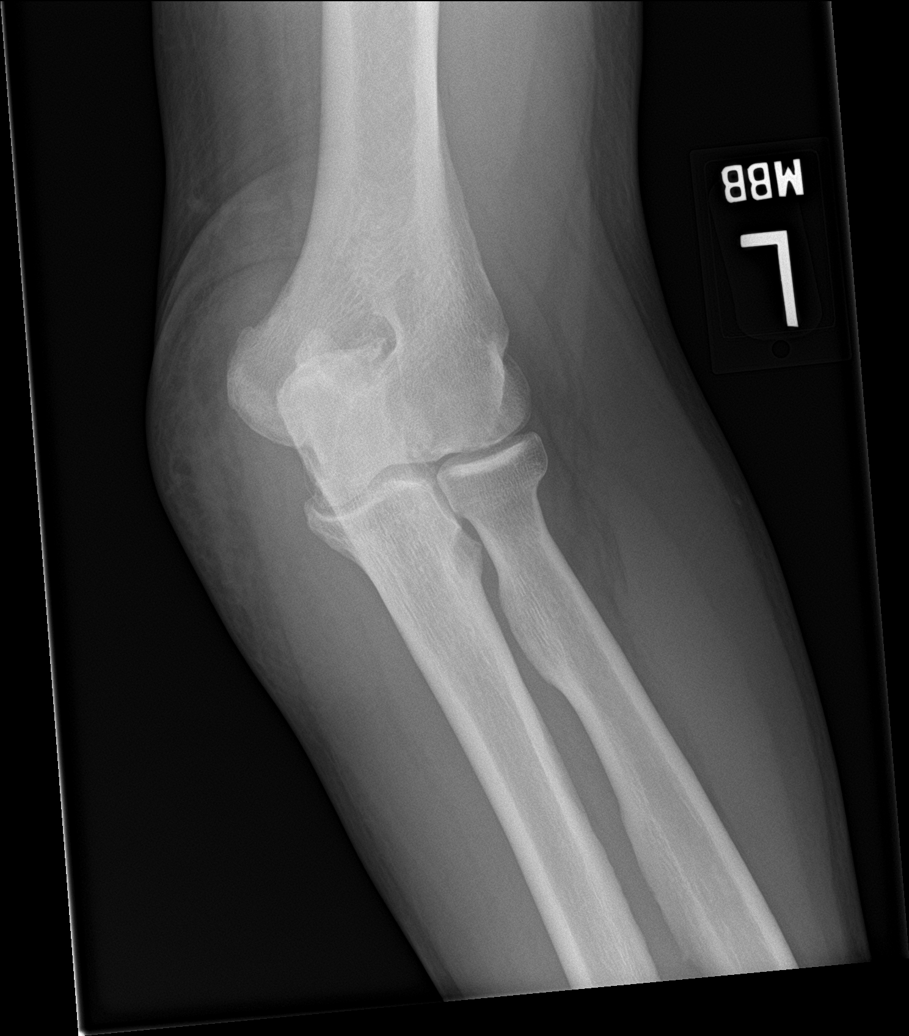

[elbow obl (2 of 2)]
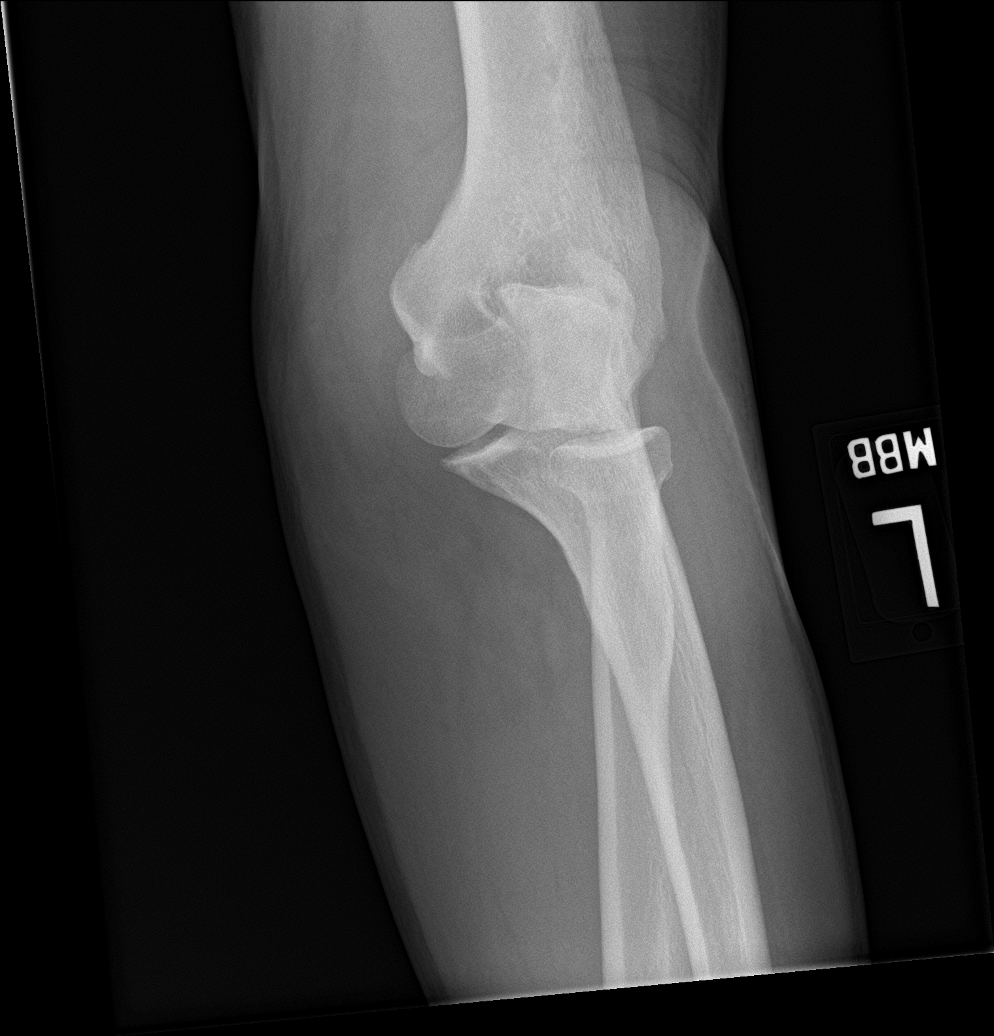

[elbow lat]
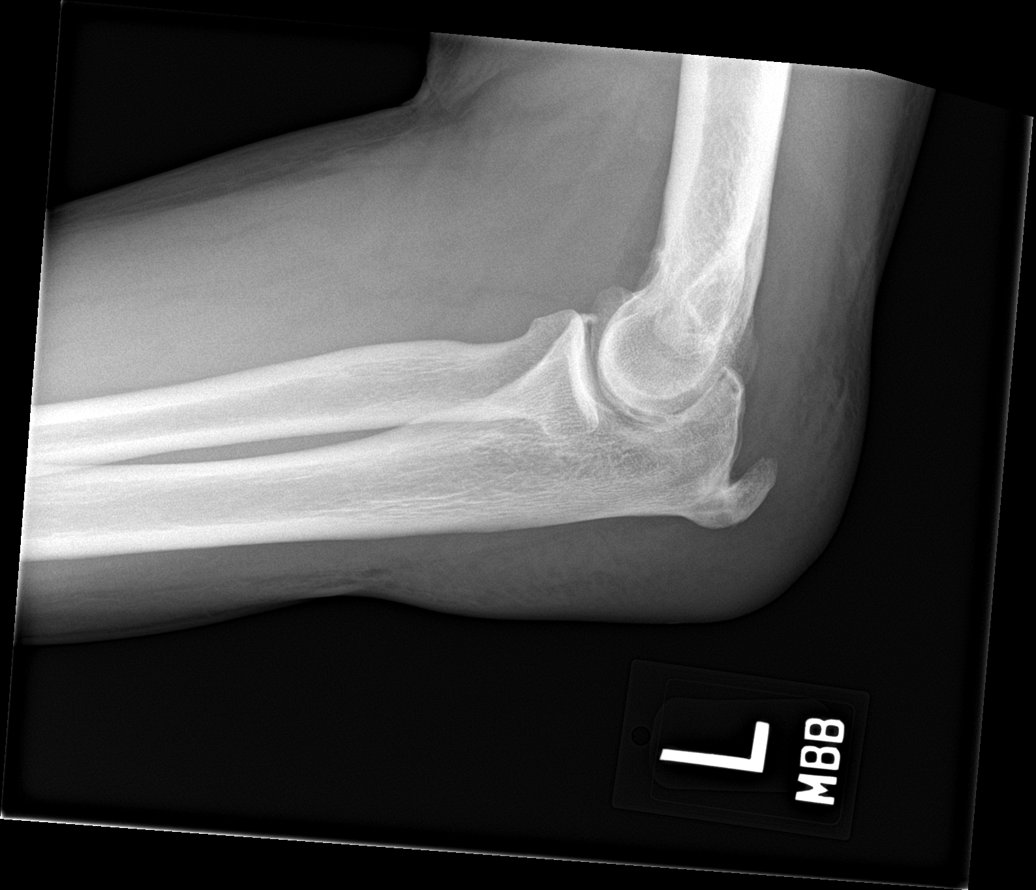

[4 of 4 positions shown; findings below may reference images not displayed]

FINDINGS: Osseous alignment is normal. Bone mineralization is normal. No acute
or suspicious osseous finding. No appreciable joint effusion.
Chronic spurring noted over the olecranon.

There is soft tissue thickening/edema posterior to the LEFT elbow,
possibly related to the underlying bursa. No soft tissue gas. No
radiodense foreign body.
IMPRESSION: 1. Soft tissue thickening/edema posterior to the LEFT elbow,
possibly related to underlying bursitis, possibly cellulitis
confined to the subcutaneous soft tissues.
2. No evidence of underlying joint effusion.
3. No acute appearing osseous abnormality.

## 2018-09-06 ENCOUNTER — Other Ambulatory Visit: Payer: Self-pay

## 2018-09-06 ENCOUNTER — Emergency Department (HOSPITAL_COMMUNITY)
Admission: EM | Admit: 2018-09-06 | Discharge: 2018-09-06 | Disposition: A | Discharge status: Home / Self Care | Payer: Medicaid Other | Attending: Emergency Medicine | Admitting: Emergency Medicine

## 2018-09-06 ENCOUNTER — Encounter (HOSPITAL_COMMUNITY): Payer: Self-pay | Admitting: Emergency Medicine

## 2018-09-06 ENCOUNTER — Emergency Department (HOSPITAL_COMMUNITY): Payer: Medicaid Other

## 2018-09-06 DIAGNOSIS — Y939 Activity, unspecified: Secondary | ICD-10-CM | POA: Insufficient documentation

## 2018-09-06 DIAGNOSIS — E119 Type 2 diabetes mellitus without complications: Secondary | ICD-10-CM | POA: Diagnosis not present

## 2018-09-06 DIAGNOSIS — Z79899 Other long term (current) drug therapy: Secondary | ICD-10-CM | POA: Insufficient documentation

## 2018-09-06 DIAGNOSIS — I10 Essential (primary) hypertension: Secondary | ICD-10-CM | POA: Diagnosis not present

## 2018-09-06 DIAGNOSIS — M7989 Other specified soft tissue disorders: Secondary | ICD-10-CM | POA: Diagnosis not present

## 2018-09-06 DIAGNOSIS — M7022 Olecranon bursitis, left elbow: Secondary | ICD-10-CM | POA: Diagnosis not present

## 2018-09-06 DIAGNOSIS — Z85038 Personal history of other malignant neoplasm of large intestine: Secondary | ICD-10-CM | POA: Diagnosis not present

## 2018-09-06 DIAGNOSIS — M25522 Pain in left elbow: Secondary | ICD-10-CM | POA: Diagnosis not present

## 2018-09-06 DIAGNOSIS — E785 Hyperlipidemia, unspecified: Secondary | ICD-10-CM | POA: Diagnosis not present

## 2018-09-06 DIAGNOSIS — Z7984 Long term (current) use of oral hypoglycemic drugs: Secondary | ICD-10-CM | POA: Diagnosis not present

## 2018-09-06 DIAGNOSIS — E7800 Pure hypercholesterolemia, unspecified: Secondary | ICD-10-CM | POA: Diagnosis not present

## 2018-09-06 MED ORDER — LIDOCAINE-EPINEPHRINE (PF) 2 %-1:200000 IJ SOLN
20.0000 mL | Freq: Once | INTRAMUSCULAR | Status: AC
  Administered 2018-09-06: 20 mL via INTRADERMAL
  Filled 2018-09-06: qty 20

## 2018-09-06 NOTE — ED Triage Notes (Signed)
Pt has recurrent left elbow pain and swelling. Has had fluid drained of multiple times.

## 2018-09-06 NOTE — ED Provider Notes (Signed)
[facility] EMERGENCY DEPARTMENT Provider Note   CSN: [id number] Arrival date & time: 09/06/18  1941     History   Chief Complaint Chief Complaint  Patient presents with  . Elbow Pain    HPI Kyle Merritt is a 57 y.o. male who presents with left elbow pain. PMH significant for diabetes, hx of substance abuse, CKD, hx of colon cancer, hx of olecranon bursitis. He states that over the past couple days he has had pain and swelling of the left elbow over the olecranon. 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 He has had to have fluid drained off of it in the past. He is requesting this again. He bought a brace for his elbow to prevent this from happening again. No fever or pain with ROM. He has been clean since graduating from [employer]  HPI  Past Medical History:  Diagnosis Date  . Colon cancer ([place])   . Diabetes mellitus without complication ([place])   . High cholesterol   . History of colon cancer   . Hypertension     Patient Active Problem List   Diagnosis Date Noted  . Olecranon bursitis of left elbow 06/04/2018  . Trigger finger, acquired 06/04/2018  . Cervical disc disorder with radiculopathy of cervical region 05/08/2018  . Other chronic pain 02/04/2018  . History of cocaine use 02/04/2018  . Hyperlipidemia 02/04/2018  . Colon cancer screening 11/29/2014  . Knee pain, acute 10/04/2014  . Essential hypertension 08/02/2014  . Dental abscess 08/02/2014  . Diabetes ([place]) 04/29/2014  . HTN (hypertension) 04/29/2014  . Dyslipidemia 04/29/2014  . History of colon cancer 04/29/2014  . Back pain 04/29/2014  . Erectile dysfunction 04/29/2014  . Tobacco abuse 07/27/2013  . PURE HYPERCHOLESTEROLEMIA 12/31/2008  . TESTOSTERONE DEFICIENCY 12/10/2008  . GERD 12/08/2008  . ONYCHOMYCOSIS, BILATERAL 11/09/2008  . DIABETES MELLITUS 11/09/2008  . Malignant neoplasm of colon ([place]) 12/24/2002     Past Surgical History:  Procedure Laterality Date  . COLON SURGERY          Home Medications    Prior to Admission medications   Medication Sig Start Date End Date Taking? Authorizing Provider  gabapentin (NEURONTIN) 300 MG capsule Take 1 capsule (300 mg total) by mouth 4 (four) times daily. 07/30/18   Lanae Boast, FNP  lisinopril-hydrochlorothiazide (PRINZIDE,ZESTORETIC) 20-12.5 MG tablet Take 1 tablet by mouth daily. 07/30/18   Lanae Boast, FNP  meloxicam (MOBIC) 7.5 MG tablet Take 1 tablet (7.5 mg total) by mouth daily. 07/30/18   Lanae Boast, FNP  metFORMIN (GLUCOPHAGE) 1000 MG tablet Take 1 tablet (1,000 mg total) by mouth 2 (two) times daily with a meal. 07/30/18   Lanae Boast, FNP  sildenafil (VIAGRA) 100 MG tablet Take 0.5 tablets (50 mg total) by mouth daily as needed for erectile dysfunction. 07/30/18   Lanae Boast, FNP  simvastatin (ZOCOR) 40 MG tablet Take 1 tablet (40 mg total) by mouth daily. 07/30/18   Lanae Boast, FNP    Family History Family History  Problem Relation Age of Onset  . Diabetes Mother   . Hypertension Mother     Social History Social History   Tobacco Use  . Smoking status: Never Smoker  . Smokeless tobacco: Never Used  Substance Use Topics  . Alcohol use: No  . Drug use: No     Allergies   Patient has no known allergies.   Review  of Systems Review of Systems  Constitutional: Negative for fever.  Musculoskeletal: Positive for arthralgias and joint swelling.     Physical Exam Updated Vital Signs BP 136/89 (BP Location: Right Arm)   Pulse 94   Temp 98.5 F (36.9 C) (Oral)   Resp 16   SpO2 99%   Physical Exam  Constitutional: He is oriented to person, place, and time. He appears well-developed and well-nourished. No distress.  HENT:  Head: Normocephalic and atraumatic.  Eyes: Pupils are equal, round, and reactive to light. Conjunctivae are normal. Right eye exhibits no discharge. Left eye exhibits no discharge. No  scleral icterus.  Neck: Normal range of motion.  Cardiovascular: Normal rate.  Pulmonary/Chest: Effort normal. No respiratory distress.  Abdominal: He exhibits no distension.  Musculoskeletal:  Left elbow: Swelling over the olecranon which is fluctuant but only minimally tender. Surrounding erythema. FROM without significant pain. Pain is elicited at 161 degrees of extension.  Neurological: He is alert and oriented to person, place, and time.  Skin: Skin is warm and dry.  Psychiatric: He has a normal mood and affect. His behavior is normal.  Nursing note and vitals reviewed.    ED Treatments / Results  Labs (all labs ordered are listed, but only abnormal results are displayed) Labs Reviewed - No data to display  EKG None  Radiology Dg Elbow Complete Left  Result Date: 09/06/2018 CLINICAL DATA:  Recurrent left elbow pain and swelling. Fluid drained multiple times. EXAM: LEFT ELBOW - COMPLETE 3+ VIEW COMPARISON:  05/25/2018 FINDINGS: No evidence of acute fracture or dislocation. Mild soft tissue swelling along the center surface of the elbow which may related to olecranon bursitis. Prominent enthesophyte over the olecranon. IMPRESSION: No acute fracture. Soft tissue swelling over the extensor surface of the elbow with prominent enthesophyte of the olecranon. Findings may be due to olecranon bursitis. Electronically Signed   By: Marin Olp M.D.   On: 09/06/2018 20:32    Procedures .Marland KitchenIncision and Drainage Date/Time: 09/06/2018 10:27 PM Performed by: Recardo Evangelist, PA-C Authorized by: Recardo Evangelist, PA-C   Consent:    Consent obtained:  Verbal   Consent given by:  Patient   Risks discussed:  Bleeding, incomplete drainage, pain and damage to other organs   Alternatives discussed:  No treatment Universal protocol:    Procedure explained and questions answered to patient or proxy's satisfaction: yes     Relevant documents present and verified: yes     Test results  available and properly labeled: yes     Imaging studies available: yes     Required blood products, implants, devices, and special equipment available: yes     Site/side marked: yes     Immediately prior to procedure a time out was called: yes     Patient identity confirmed:  Verbally with patient Location:    Type:  Fluid collection   Size:  5x5cm   Location:  Upper extremity   Upper extremity location:  Elbow   Elbow location:  L elbow Pre-procedure details:    Skin preparation:  Chloraprep Anesthesia (see MAR for exact dosages):    Anesthesia method:  Local infiltration   Local anesthetic:  Lidocaine 2% WITH epi Procedure type:    Complexity:  Simple Procedure details:    Needle aspiration: yes     Needle size:  18 G   Incision types:  Single straight   Incision depth:  Dermal Post-procedure details:    Patient tolerance of procedure:  Tolerated  well, no immediate complications Comments:     10cc of serosanguinous fluid was drained   (including critical care time)   Medications Ordered in ED Medications  lidocaine-EPINEPHrine (XYLOCAINE W/EPI) 2 %-1:200000 (PF) injection 20 mL (has no administration in time range)     Initial Impression / Assessment and Plan / ED Course  I have reviewed the triage vital signs and the nursing notes.  Pertinent labs & imaging results that were available during my care of the patient were reviewed by me and considered in my medical decision making (see chart for details).  58 year old male presents with L olecranon bursitis which is a recurrent problem for him. His vitals are normal. He has normal ROM. Unfortunately he doesn't have any good medication options for this problem since he is a diabetic and has CKD. Fluid aspiration of the elbow was done and 10cc of fluid was pulled off. He had good relief of his symptoms. He will wear an elbow pad to prevent recurrence. He was advised in the future to go to sports medicine for this issue.  Return precautions given.  Final Clinical Impressions(s) / ED Diagnoses   Final diagnoses:  Olecranon bursitis of left elbow    ED Discharge Orders    None       Recardo Evangelist, PA-C 09/06/18 [place], [place], DO 09/06/18 [id number]

## 2018-09-06 NOTE — Discharge Instructions (Addendum)
Please keep elbow clean and covered Wear brace to keep swelling from coming back Follow up with sports medicine Return if you develop a fever or severe pain

## 2018-09-09 ENCOUNTER — Telehealth: Payer: Self-pay

## 2018-09-09 NOTE — Telephone Encounter (Signed)
Called patient, he was asking if we have received notes from eye exam. I don't see anything in chart. He states he will call his eye doctor and have them fax [place] his eye exam notes. Thanks!

## 2018-09-17 ENCOUNTER — Telehealth: Payer: Self-pay | Admitting: *Deleted

## 2018-09-17 ENCOUNTER — Ambulatory Visit (INDEPENDENT_AMBULATORY_CARE_PROVIDER_SITE_OTHER): Payer: Medicaid Other | Admitting: Family Medicine

## 2018-09-17 VITALS — BP 134/93 | Ht 72.0 in | Wt 240.0 lb

## 2018-09-17 DIAGNOSIS — M25562 Pain in left knee: Secondary | ICD-10-CM | POA: Diagnosis not present

## 2018-09-17 DIAGNOSIS — G8929 Other chronic pain: Secondary | ICD-10-CM | POA: Diagnosis not present

## 2018-09-17 DIAGNOSIS — M7022 Olecranon bursitis, left elbow: Secondary | ICD-10-CM | POA: Diagnosis not present

## 2018-09-17 MED ORDER — METHYLPREDNISOLONE ACETATE 40 MG/ML IJ SUSP: 40.0000 mg | Freq: Once | INTRAMUSCULAR | Status: DC

## 2018-09-17 NOTE — Telephone Encounter (Signed)
Kyle Merritt states pt is requesting referral to PT. I reviewed pt's clinicals and he has not been seen by our doctors.

## 2018-09-17 NOTE — Patient Instructions (Addendum)
Your knee pain is likely due to: 1) Arthritis 2) IT band tightness 3) Meniscus injury  To fix the IT band and to help with meniscus injury, we will give you a referral to PT We will send you for x-rays today We will put a steroid injection in the knee  Because you have had the elbow drained twice, we would not recommend draining again today. I would recommend leaving it alone for now, but the next step would be surgery - call us if you want a referral to a surgeon to discuss bursectomy.

## 2018-09-17 NOTE — Progress Notes (Signed)
PCP: Lanae Boast, FNP  Subjective:   HPI: Patient is a 57 y.o. male here for follow up on elbow swelling and new report of left knee, hip and back pain.  5/15: Patient reports about 4-5 months ago he started to get pain in his left wrist that radiated up his arm. Has progressed to include pain in right moreso than left shoulders anteriorly. Right side associated with numbness into right index and middle digits. Moving neck to the left side makes his right arm hurt. Has tried gabapentin. Had an arthritis panel that was negative. He drives trucks for work. Difficulty sleeping due to pain - 9/10 level with throbbing. No bowel/bladder dysfunction.  6/12: Patient reports he's improving from his neck/shoulder pain mildly. He is doing physical therapy (done 3 visits so far) and home exercises. Still with radiation into right > left side with tingling into right index and middle digits. Taking mobic. Recently went to ED for recurrence of left olecranon bursitis - had 67mL drained from this and has no pain, redness, fever. Also with catching of his thumb at IP joint right hand worse in morning but no pain with this.  7/10: Patient reports he's continued to have bogginess, swelling posterior left elbow. Some pain if he hits this but pain is a soreness. Started to get pain with his catching of right thumb. Some swelling at base of thumb. No skin changes, numbness.  9/25: Patient continues to report swelling in posterior left elbow. Of note, he was seen in the ED on 9.14 for left elbow pain felt to be consistent with his olecranon bursitis. Fluid aspiration of the elbow was done and he purchased an elbow pad to prevent recurrent. It has gotten a little larger since leaving ED He denies any elbow pain since ED visit He has noticed that the skin is breaking down a little bit over the area No numbness and no new injury.  No redness, fever.  Today patient also reports new knee, leg and  back pain.  Knee pain is localized to the anterior lateral surface of the knee. It is 8/10 in intensity at rest improves with movement. Leg pain radiates up the lateral leg into the hip and the lateral back. He denies tenderness along the spine.  He does report acute injury. About a year ago, he was getting out of his truck and his foot slipped. His left leg was bent. The knee swelled up immediately after that but then went down It has been causing pain off and on since that time and swelling has continued but he has not done anything about it He has a history of "microscopic" knee repair (arthoscopic knee surgery) in both knees approximately 20 years ago and he is wondering if needs reimaging or another surgery No skin changes, numbness.  Past Medical History:  Diagnosis Date  . Colon cancer ([place])   . Diabetes mellitus without complication ([place])   . High cholesterol   . History of colon cancer   . Hypertension     Current Outpatient Medications on File Prior to Visit  Medication Sig Dispense Refill  . gabapentin (NEURONTIN) 300 MG capsule Take 1 capsule (300 mg total) by mouth 4 (four) times daily. 120 capsule 3  . lisinopril-hydrochlorothiazide (PRINZIDE,ZESTORETIC) 20-12.5 MG tablet Take 1 tablet by mouth daily. 90 tablet 3  . metFORMIN (GLUCOPHAGE) 1000 MG tablet Take 1 tablet (1,000 mg total) by mouth 2 (two) times daily with a meal. 180 tablet 1  . sildenafil (  VIAGRA) 100 MG tablet Take 0.5 tablets (50 mg total) by mouth daily as needed for erectile dysfunction. 10 tablet 11  . simvastatin (ZOCOR) 40 MG tablet Take 1 tablet (40 mg total) by mouth daily. 30 tablet 5   No current facility-administered medications on file prior to visit.     Past Surgical History:  Procedure Laterality Date  . COLON SURGERY      No Known Allergies  Social History   Socioeconomic History  . Marital status: Divorced    Spouse name: Not on file  . Number of children: Not on file  . Years of  education: Not on file  . Highest education level: Not on file  Occupational History  . Not on file  Social Needs  . Financial resource strain: Not on file  . Food insecurity:    Worry: Not on file    Inability: Not on file  . Transportation needs:    Medical: Not on file    Non-medical: Not on file  Tobacco Use  . Smoking status: Never Smoker  . Smokeless tobacco: Never Used  Substance and Sexual Activity  . Alcohol use: No  . Drug use: No  . Sexual activity: Yes  Lifestyle  . Physical activity:    Days per week: Not on file    Minutes per session: Not on file  . Stress: Not on file  Relationships  . Social connections:    Talks on phone: Not on file    Gets together: Not on file    Attends religious service: Not on file    Active member of club or organization: Not on file    Attends meetings of clubs or organizations: Not on file    Relationship status: Not on file  . Intimate partner violence:    Fear of current or ex partner: Not on file    Emotionally abused: Not on file    Physically abused: Not on file    Forced sexual activity: Not on file  Other Topics Concern  . Not on file  Social History Narrative  . Not on file    Family History  Problem Relation Age of Onset  . Diabetes Mother   . Hypertension Mother     BP (!) 134/93   Ht 6' (1.829 m)   Wt 240 lb (108.9 kg)   BMI 32.55 kg/m   Review of Systems: See HPI above.     Objective:  Physical Exam:  Gen: NAD, comfortable in exam room  MSK Elbow: swelling over posterior elbow over olecranon bursa. No TTP. Full ROM with 5/5 strength flexion and extension.  NVI distally.  Knee:  No gross deformity, ecchymoses, swelling. TTP over anterolateal knee joint, IT band. FROM with 5/5 strength flexion and extension. Negative ant/post drawers. Negative valgus/varus testing. Negative lachmans. +mcmurrays, +apleys NV intact distally. Hip/Leg: TTP over anterior muscles around hip joint but not over  joint. + Ober's Test Back: TTP over lateral muscles of lumbar area. No TTP over spine.    Assessment & Plan:  1. Knee Pain - patient is describing multiple processes in his knee; with a remote history of arthroscopic repair is at increased risk for arthritis in that knee. However, there are multiple focal abnormalities on the patient's exam today that suggest he may also have IT band tightness contributing to what he perceives as hip and back pain. His positive McMurray's sign is also concerning for a re-injury of the meniscus. In patients with arthritis and  meniscal injury, it is appropriate to wait for surgical intervention.  - Provided cortisone injection of knee joint today - Provided PT exercises for IT band and meniscal injury - he initially wanted to do PT but locations that have extended hours do not take his insurance so he will do home exercises. - XR knee - Will will call patient with XR results and set follow up based on that result  After informed written consent timeout was performed, patient was seated on exam table. Left knee was prepped with alcohol swab and utilizing anteromedial approach, patient's left knee was injected intraarticularly with 3:1 marcaine: depomedrol. Patient tolerated the procedure well without immediate complications.  2. Left Olecranon Bursitis - given that the patient has had 2 joint aspirations to date without improvement in his symptoms and given previous ultrasound findings that there is significant debris material in the joint, would not recommend further aspiration at this time. Given that patient is not having pain or significant limitations of movement because of the swelling, he does not necessarily require further treatment and this time but further treatment would consistent of surgical evaluation for bursa removal - Discussed surgical intervention and indications for proceeding with surgery - Requested that patient call us if he has worsening of pain  or increase in difficulty managing the swelling that would prompt him to want to see surgeon

## 2018-09-17 NOTE — Telephone Encounter (Signed)
Left message encouraging pt to got to his PCP for PT referral, we could not refer him without evaluating him.

## 2018-09-18 ENCOUNTER — Encounter: Payer: Self-pay | Admitting: Family Medicine

## 2018-09-29 ENCOUNTER — Other Ambulatory Visit: Payer: Self-pay | Admitting: Family Medicine

## 2018-09-29 MED FILL — metFORMIN HCL 1000 MG TABS: 1000 | 30 days supply | Qty: 60 | Fill #5

## 2018-09-29 MED FILL — !VIAGRA 25 MG TABLET: 25 | 30 days supply | Qty: 10 | Fill #0

## 2018-09-29 MED FILL — SIMVASTATIN 40 MG TABLET: 40 | 30 days supply | Qty: 30 | Fill #0

## 2018-10-01 MED FILL — GABAPENTIN 300 MG CAPSULE: 300 | 30 days supply | Qty: 120 | Fill #0

## 2018-10-03 ENCOUNTER — Encounter (INDEPENDENT_AMBULATORY_CARE_PROVIDER_SITE_OTHER): Payer: Self-pay

## 2018-10-03 ENCOUNTER — Encounter: Payer: Self-pay | Admitting: Gastroenterology

## 2018-10-03 ENCOUNTER — Ambulatory Visit (INDEPENDENT_AMBULATORY_CARE_PROVIDER_SITE_OTHER): Payer: Medicaid Other | Admitting: Gastroenterology

## 2018-10-03 ENCOUNTER — Ambulatory Visit
Admission: RE | Admit: 2018-10-03 | Discharge: 2018-10-03 | Disposition: A | Discharge status: Home / Self Care | Payer: Medicaid Other | Source: Ambulatory Visit | Attending: Family Medicine | Admitting: Family Medicine

## 2018-10-03 VITALS — BP 142/72 | HR 67 | Ht 72.0 in | Wt 246.0 lb

## 2018-10-03 DIAGNOSIS — K5900 Constipation, unspecified: Secondary | ICD-10-CM

## 2018-10-03 DIAGNOSIS — Z85038 Personal history of other malignant neoplasm of large intestine: Secondary | ICD-10-CM

## 2018-10-03 DIAGNOSIS — D649 Anemia, unspecified: Secondary | ICD-10-CM | POA: Diagnosis not present

## 2018-10-03 DIAGNOSIS — M25562 Pain in left knee: Principal | ICD-10-CM

## 2018-10-03 DIAGNOSIS — G8929 Other chronic pain: Secondary | ICD-10-CM

## 2018-10-03 DIAGNOSIS — M1712 Unilateral primary osteoarthritis, left knee: Secondary | ICD-10-CM | POA: Diagnosis not present

## 2018-10-03 MED ORDER — PEG 3350-KCL-NABCB-NACL-NASULF 236 G PO SOLR: 4000.0000 mL | Freq: Once | ORAL | 0 refills | Status: AC

## 2018-10-03 NOTE — Progress Notes (Signed)
[place] VISIT   Primary Care Provider Lanae Boast, [place] [address] [phone]  Referring Provider Lanae Boast, [address], [place] [address] [phone]  Patient Profile: Kyle Merritt is a 57 y.o. male with a pmh significant for Colon Cancer (s/p hemicolectomy), DM, HLD, HTN.  The patient presents to the [facility] for an evaluation and management of problem(s) noted below:  Problem List 1. History of colon cancer   2. Constipation, unspecified constipation type   3. Anemia, unspecified type     History of Present Illness: This is the patient's first visit to the [facility].  Follow-up in [facility] by Dr. Marlane Hatcher and had his GI care in the region.  He now lives in [place].    Patient reports a history of colon cancer in 2004 when he underwent an early colon cancer screening.  He states he had no symptoms of hematochezia, change in bowel habits, or family history that was noteworthy.  He eventually underwent a hemicolectomy though he is not able to tell me if it was on his left or right side where the colon cancer was found.  He has had interval colonoscopies at appropriate timing.  His last colonoscopy however was in August 2013.  We do not have the actual colonoscopy report but the pathology from the colonoscopy suggested no evidence of any adenomas.  Thus he would be due for 5-year follow-up which would have been due in August 2018.  The patient has issues with constipation which have been longer standing.  He has bowel movements every day or every other day.  Bowel movements are usually formed at times he has to strain.  He has not noted any blood in the stools.  His son is 81 years of age and has not reached age of need for colonoscopy as of yet (this will be at age 5).  Patient has never had an upper endoscopy.  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  GI Review of Systems Positive as above Negative for pyrosis, dysphagia, odynophagia, jaundice, change in appetite, early satiety, abdominal pain, hematochezia, melena, changes in bowel habits  Review of Systems General: Denies fevers/chills/weight loss HEENT: Denies oral lesions Cardiovascular: Denies chest pain/palpitations Pulmonary: Denies shortness of breath Gastroenterological: See HPI Genitourinary: Denies darkened urine Hematological: Denies easy bruising/bleeding Endocrine: Denies temperature intolerance Dermatological: Denies skin changes Psychological: Mood is stable Musculoskeletal: Denies new arthralgias   Medications Current Outpatient Medications  Medication Sig Dispense Refill  . gabapentin (NEURONTIN) 300 MG capsule Take 1 capsule (300 mg total) by mouth 4 (four) times daily. 120 capsule 3  . lisinopril-hydrochlorothiazide (PRINZIDE,ZESTORETIC) 20-12.5 MG tablet Take 1 tablet by mouth daily. 90 tablet 3  . sildenafil (VIAGRA) 100 MG tablet Take 0.5 tablets (50 mg total) by mouth daily as needed for erectile dysfunction. 10 tablet 11  . simvastatin (ZOCOR) 40 MG tablet Take 1 tablet (40 mg total) by mouth daily. 30 tablet 5  . VIAGRA 25 MG tablet TAKE 2 TABLETS BY MOUTH EVERY DAY AS NEEDED FOR ERECETILE DYSFUNCTION 20 tablet 0   Current Facility-Administered Medications  Medication Dose Route Frequency Provider Last Rate Last Dose  . methylPREDNISolone acetate (DEPO-MEDROL) injection 40 mg  40 mg Intra-articular Once Hudnall, Sharyn Lull, MD  Allergies No Known Allergies  Histories Past Medical History:  Diagnosis Date  . Colon cancer ([place])   . Diabetes mellitus without complication ([place])   . High cholesterol   . History of colon cancer   . Hypertension    Past Surgical History:  Procedure Laterality Date  . COLON SURGERY    .  KNEE ARTHROSCOPY Bilateral    Social History   Socioeconomic History  . Marital status: Divorced    Spouse name: Not on file  . Number of children: Not on file  . Years of education: Not on file  . Highest education level: Not on file  Occupational History  . Not on file  Social Needs  . Financial resource strain: Not on file  . Food insecurity:    Worry: Not on file    Inability: Not on file  . Transportation needs:    Medical: Not on file    Non-medical: Not on file  Tobacco Use  . Smoking status: Never Smoker  . Smokeless tobacco: Never Used  Substance and Sexual Activity  . Alcohol use: No  . Drug use: No  . Sexual activity: Yes  Lifestyle  . Physical activity:    Days per week: Not on file    Minutes per session: Not on file  . Stress: Not on file  Relationships  . Social connections:    Talks on phone: Not on file    Gets together: Not on file    Attends religious service: Not on file    Active member of club or organization: Not on file    Attends meetings of clubs or organizations: Not on file    Relationship status: Not on file  . Intimate partner violence:    Fear of current or ex partner: Not on file    Emotionally abused: Not on file    Physically abused: Not on file    Forced sexual activity: Not on file  Other Topics Concern  . Not on file  Social History Narrative  . Not on file   Family History  Problem Relation Age of Onset  . Diabetes Mother   . Hypertension Mother   . Colon cancer Paternal Uncle   . Esophageal cancer Neg Hx   . Inflammatory bowel disease Neg Hx   . Liver disease Neg Hx   . Pancreatic cancer Neg Hx   . Stomach cancer Neg Hx    I have reviewed his medical, social, and family history in detail and updated the electronic medical record as necessary.    PHYSICAL EXAMINATION  BP (!) 142/72   Pulse 67   Ht 6' (1.829 m)   Wt 246 lb (111.6 kg)   BMI 33.36 kg/m  Wt Readings from Last 3 Encounters:  10/03/18 246 lb  (111.6 kg)  09/17/18 240 lb (108.9 kg)  08/18/18 246 lb (111.6 kg)  GEN: NAD, appears stated age, doesn't appear chronically ill PSYCH: Cooperative, without pressured speech EYE: Conjunctivae pink, sclerae anicteric ENT: MMM, without oral ulcers, no erythema or exudates noted NECK: Supple CV: RR without R/Gs  RESP: CTAB posteriorly, without wheezing GI: NABS, soft, NT/ND, large midline surgical scar from prior colectomy present that is well-healed, without rebound or guarding, no HSM appreciated MSK/EXT: No lower extremity edema SKIN: No jaundice NEURO:  Alert & Oriented x 3, no focal deficits   REVIEW OF DATA  I reviewed the following data at the time of this encounter:  GI Procedures and Studies  August  2013 colonoscopy pathology (no report of the actual colonoscopy is found) Colonic mucosal biopsy Inflammatory pseudopolyp, no evidence of adenomatous dysplasia or malignancy  Laboratory Studies  2016 labs Hemoglobin 12.5 Hematocrit 40 MCV 79 point Platelets 314,000 AST/ALT 12/13 Alkaline phosphatase 124 Total bili 1.0 Creatinine 1.58  Imaging Studies  No relevant studies  Outside Records Review  The only records that came with the patient which were greater than 60 pages long was notation of a colonoscopy that occurred in August 2013 the actual colonoscopy report was not present.  The next set of pages included a history of a ED visit for left foot and ankle pain from 2016.   ASSESSMENT  Mr. Pridgen is a 57 y.o. male with a pmh significant for Colon Cancer (s/p hemicolectomy), DM, HLD, HTN.  The patient is seen today for evaluation and management of:  1. History of colon cancer   2. Constipation, unspecified constipation type   3. Anemia, unspecified type    This is a clinically and hemodynamically stable patient who presents for establishment of GI care here and the [facility] for appropriate high risk colon cancer surveillance in the setting of prior colon  cancer.  He is slightly overdue for his colonoscopy this year which we will plan to obtain in the coming weeks.  Otherwise he has longer standing constipation that has been going on for years.  We discussed the role of fiber supplementation as well as potential initiation of stool softeners and/or laxatives to try and help things move along.  I think it would be reasonable for him to initiate fiber supplementation with FiberCon or any of the other fiber supplements over-the-counter to see how he can do over the course of the next few weeks or months.  I recommend using that once to twice daily.  I do not have an opportunity to review all his records until after his clinic visit was over because we obtained some of them.  Interestingly, the patient did have some blood work from a 2016 ED visit to [facility] general where he had a in anemia as well as a low MCV.  The patient being black/African-American is at risk for thalassemia however I think it is reasonable to consider the role of obtaining iron indices even though his MCV is 80 a few months ago he still has a bit of anemia.  We will call and try to have the patient obtain these early next week prior to his schedule colonoscopy should he require a possible upper endoscopy as well even though he has no GI symptoms.  If he is unable to get the labs before his upcoming colonoscopy we can always get the labs after and then consider an upper endoscopy thereafter.  The risks and benefits of endoscopic evaluation were discussed with the patient; these include but are not limited to the risk of perforation, infection, bleeding, missed lesions, lack of diagnosis, severe illness requiring hospitalization, as well as anesthesia and sedation related illnesses.  The patient is agreeable to proceed with colonoscopy.   PLAN  1. History of colon cancer - Trying to obtain outside surgical records for evaluation of type of colectomy - Proceed with scheduling colonoscopy for high  risk surveillance  2. Constipation, unspecified constipation type - Will begin fiber supplementation once to twice daily - Would consider addition of stool softeners - Would consider addition of laxatives (MiraLAX) once every other day depending on how he does with fiber or stool softeners  3. Anemia,  unspecified type - Will try to reach patient early next week and I will plan, if the patient is okay with obtaining labs, to obtain a CBC/iron/TIBC/ferritin/culicide GNFAO/Z30/QMVHQI (would send these as stat labs to have as soon as possible before his scheduled colonoscopy in case he were to need an add on EGD)   Orders Placed This Encounter  Procedures  . Ambulatory referral to Gastroenterology    New Prescriptions   No medications on file   Modified Medications   No medications on file    Planned Follow Up: No follow-ups on file.   Justice Britain, MD [facility] Office # [phone]

## 2018-10-03 NOTE — Patient Instructions (Addendum)
You have been scheduled for a colonoscopy. Please follow written instructions given to you at your visit today.  Please pick up your prep supplies at the pharmacy within the next 1-3 days. If you use inhalers (even only as needed), please bring them with you on the day of your procedure. Your physician has requested that you go to www.startemmi.com and enter the access code given to you at your visit today. This web site gives a general overview about your procedure. However, you should still follow specific instructions given to you by our office regarding your preparation for the procedure.  Please start [place] 1-2 times daily  Thank you for entrusting me with your care and choosing [facility] care.  Dr Rush Landmark

## 2018-10-06 ENCOUNTER — Other Ambulatory Visit (INDEPENDENT_AMBULATORY_CARE_PROVIDER_SITE_OTHER): Payer: Medicaid Other

## 2018-10-06 ENCOUNTER — Other Ambulatory Visit: Payer: Self-pay

## 2018-10-06 ENCOUNTER — Encounter: Payer: Self-pay | Admitting: Gastroenterology

## 2018-10-06 ENCOUNTER — Telehealth: Payer: Self-pay

## 2018-10-06 DIAGNOSIS — K5900 Constipation, unspecified: Secondary | ICD-10-CM | POA: Insufficient documentation

## 2018-10-06 DIAGNOSIS — D649 Anemia, unspecified: Secondary | ICD-10-CM

## 2018-10-06 LAB — VITAMIN B12: Vitamin B-12: 314 pg/mL (ref 211–911)

## 2018-10-06 LAB — CBC WITH DIFFERENTIAL/PLATELET
Basophils Absolute: 0.1 10*3/uL (ref 0.0–0.1)
Basophils Relative: 1.3 % (ref 0.0–3.0)
Eosinophils Absolute: 0.1 10*3/uL (ref 0.0–0.7)
Eosinophils Relative: 2.3 % (ref 0.0–5.0)
HCT: 36.6 % — ABNORMAL LOW (ref 39.0–52.0)
Hemoglobin: 11.9 g/dL — ABNORMAL LOW (ref 13.0–17.0)
Lymphocytes Relative: 36.6 % (ref 12.0–46.0)
Lymphs Abs: 2.2 10*3/uL (ref 0.7–4.0)
MCHC: 32.6 g/dL (ref 30.0–36.0)
MCV: 80.2 fl (ref 78.0–100.0)
Monocytes Absolute: 0.4 10*3/uL (ref 0.1–1.0)
Monocytes Relative: 6.4 % (ref 3.0–12.0)
Neutro Abs: 3.2 10*3/uL (ref 1.4–7.7)
Neutrophils Relative %: 53.4 % (ref 43.0–77.0)
Platelets: 252 10*3/uL (ref 150.0–400.0)
RBC: 4.56 Mil/uL (ref 4.22–5.81)
RDW: 14.7 % (ref 11.5–15.5)
WBC: 5.9 10*3/uL (ref 4.0–10.5)

## 2018-10-06 LAB — IBC PANEL
Iron: 56 ug/dL (ref 42–165)
Saturation Ratios: 16.4 % — ABNORMAL LOW (ref 20.0–50.0)
Transferrin: 244 mg/dL (ref 212.0–360.0)

## 2018-10-06 LAB — FERRITIN: Ferritin: 40.4 ng/mL (ref 22.0–322.0)

## 2018-10-06 LAB — FOLATE: Folate: 10.2 ng/mL (ref >5.9)

## 2018-10-06 NOTE — Telephone Encounter (Signed)
Would we be able to try and have the last patient come earlier in the day either at 730 or 1? If we cant then will just need to either reschedule patient or potentially do as 2 separate procedures. We can see what he thinks. Labs will dictate things for [place] if not IDA then wouldn't need EGD. Thanks.

## 2018-10-06 NOTE — Telephone Encounter (Signed)
-----   Message from Irving Copas., MD sent at 10/06/2018  4:02 AM EDT ----- Regarding: Patient call Dear Chong Sicilian, Can you call the patient and see if he can come and get STAT labs (Iron/TIBC/Ferritin/CBC/B12/Folate)? I see he has a PM appointment for Colonoscopy tomorrow, but may need to add on an EGD if he has evidence of Iron Deficiency.  If you can call and he can get labs done before tomorrow, and if we can get potential approval for EGD portion, we may need to add that on to the patient for tomorrow.  I appreciate it the help. Chester Holstein

## 2018-10-06 NOTE — Telephone Encounter (Signed)
I spoke with the pt and he will come in now for labs.  I have entered those as STAT.  There are no openings to add EGD to the colon for tomorrow.  We can call the [facility] and ask if its ok to add, let me know and I will see what I can do.

## 2018-10-07 ENCOUNTER — Encounter: Payer: Self-pay | Admitting: Gastroenterology

## 2018-10-07 ENCOUNTER — Ambulatory Visit (AMBULATORY_SURGERY_CENTER): Payer: Medicaid Other | Admitting: Gastroenterology

## 2018-10-07 VITALS — BP 132/93 | HR 74 | Temp 97.8°F | Resp 13 | Ht 72.0 in | Wt 247.0 lb

## 2018-10-07 DIAGNOSIS — Z85038 Personal history of other malignant neoplasm of large intestine: Secondary | ICD-10-CM

## 2018-10-07 DIAGNOSIS — D122 Benign neoplasm of ascending colon: Secondary | ICD-10-CM

## 2018-10-07 DIAGNOSIS — D125 Benign neoplasm of sigmoid colon: Secondary | ICD-10-CM | POA: Diagnosis not present

## 2018-10-07 MED ORDER — SODIUM CHLORIDE 0.9 % IV SOLN: 500.0000 mL | Freq: Once | INTRAVENOUS | Status: DC

## 2018-10-07 NOTE — Op Note (Signed)
[facility] Patient Name: Kyle Merritt Procedure Date: 10/07/2018 3:50 PM MRN: [id number] Endoscopist: Justice Britain , MD Age: 57 Referring MD:  Date of Birth: [birth year] Gender: Male Account #: [id number] Procedure:                Colonoscopy Indications:              High risk colon cancer surveillance: Personal                            history of colon cancer, Last colonoscopy: 2013 Medicines:                Monitored Anesthesia Care Procedure:                Pre-Anesthesia Assessment:                           - Prior to the procedure, a History and Physical                            was performed, and patient medications and                            allergies were reviewed. The patient's tolerance of                            previous anesthesia was also reviewed. The risks                            and benefits of the procedure and the sedation                            options and risks were discussed with the patient.                            All questions were answered, and informed consent                            was obtained. Prior Anticoagulants: The patient has                            taken no previous anticoagulant or antiplatelet                            agents. ASA Grade Assessment: II - A patient with                            mild systemic disease. After reviewing the risks                            and benefits, the patient was deemed in                            satisfactory condition to undergo the procedure.  After obtaining informed consent, the colonoscope                            was passed under direct vision. Throughout the                            procedure, the patient's blood pressure, pulse, and                            oxygen saturations were monitored continuously. The                            Model CF-HQ190L [id number]) scope was introduced                            through the  anus and advanced to the 5 cm into the                            ileum. The colonoscopy was performed without                            difficulty. The patient tolerated the procedure.                            The quality of the bowel preparation was fair. Scope In: 3:59:11 PM Scope Out: 4:14:18 PM Scope Withdrawal Time: 0 hours 12 minutes 29 seconds  Total Procedure Duration: 0 hours 15 minutes 7 seconds  Findings:                 The digital rectal exam findings include                            non-thrombosed internal hemorrhoids. Pertinent                            negatives include no palpable rectal lesions.                           The terminal ileum and ileocecal valve appeared                            normal.                           A moderate amount of stool was found in the entire                            colon, interfering with visualization. Lavage of                            the area was performed using copious amounts,                            resulting in clearance with fair visualization.  There was evidence of a prior end-to-end                            colo-colonic anastomosis at 60 cm proximal to the                            anus. This was patent and was characterized by                            healthy appearing mucosa. The anastomosis was                            traversed.                           Two sessile polyps were found between 70-78 cm                            proximal to the anus (right colon) proximal to the                            presumed anastomosis. The polyps were 2 to 3 mm in                            size. These polyps were removed with a cold snare.                            Resection and retrieval were complete.                           A 4 mm polyp was found in the recto-sigmoid colon.                            The polyp was sessile. The polyp was removed with a                             cold snare. Resection and retrieval were complete.                           Normal mucosa was found in the entire colon                            otherwise.                           Non-bleeding non-thrombosed internal hemorrhoids                            were found during retroflexion, during perianal                            exam and during digital exam. The hemorrhoids were  Grade II (internal hemorrhoids that prolapse but                            reduce spontaneously). Complications:            No immediate complications. Estimated Blood Loss:     Estimated blood loss was minimal. Impression:               - Preparation of the colon was fair.                           - Non-thrombosed internal hemorrhoids found on                            digital rectal exam.                           - The examined portion of the ileum was normal.                           - Stool in the entire examined colon.                           - Patent end-to-end colo-colonic anastomosis,                            characterized by healthy appearing mucosa.                           - Two 2 to 3 mm polyps at 70 cm proximal to the                            anus, removed with a cold snare. Resected and                            retrieved.                           - One 4 mm polyp at the recto-sigmoid colon,                            removed with a cold snare. Resected and retrieved.                           - Normal mucosa in the entire examined colon                            otherwise.                           - Non-bleeding non-thrombosed internal hemorrhoids. Recommendation:           - The patient will be observed post-procedure,                            until all discharge criteria are met.                           -  Discharge patient to home.                           - High fiber diet.                           - Await pathology results.                            - Repeat colonoscopy in 3 - 5 years for                            surveillance based on findings of adenomatous                            tissue.                           - The findings and recommendations were discussed                            with the patient.                           - The findings and recommendations were discussed                            with the designated responsible adult. Justice Britain, MD 10/07/2018 4:22:10 PM

## 2018-10-07 NOTE — Progress Notes (Signed)
Spontaneous respirations throughout. VSS. Resting comfortably. To PACU on room air. Report to  RN. 

## 2018-10-07 NOTE — Progress Notes (Signed)
Called to room to assist during endoscopic procedure.  Patient ID and intended procedure confirmed with present staff. Received instructions for my participation in the procedure from the performing physician.  

## 2018-10-07 NOTE — Progress Notes (Signed)
Pt. Reports no change in his medical or surgical history his pre-visit 10/03/2018.

## 2018-10-07 NOTE — Patient Instructions (Signed)
YOU HAD AN ENDOSCOPIC PROCEDURE TODAY AT [facility]:   Refer to the procedure report that was given to you for any specific questions about what was found during the examination.  If the procedure report does not answer your questions, please call your gastroenterologist to clarify.  If you requested that your care partner not be given the details of your procedure findings, then the procedure report has been included in a sealed envelope for you to review at your convenience later.  YOU SHOULD EXPECT: Some feelings of bloating in the abdomen. Passage of more gas than usual.  Walking can help get rid of the air that was put into your GI tract during the procedure and reduce the bloating. If you had a lower endoscopy (such as a colonoscopy or flexible sigmoidoscopy) you may notice spotting of blood in your stool or on the toilet paper. If you underwent a bowel prep for your procedure, you may not have a normal bowel movement for a few days.  Please Note:  You might notice some irritation and congestion in your nose or some drainage.  This is from the oxygen used during your procedure.  There is no need for concern and it should clear up in a day or so.  SYMPTOMS TO REPORT IMMEDIATELY:   Following lower endoscopy (colonoscopy or flexible sigmoidoscopy):  Excessive amounts of blood in the stool  Significant tenderness or worsening of abdominal pains  Swelling of the abdomen that is new, acute  Fever of 100F or higher  For urgent or emergent issues, a gastroenterologist can be reached at any hour by calling [phone].   DIET:  We do recommend a small meal at first, but then you may proceed to your regular diet.  Drink plenty of fluids but you should avoid alcoholic beverages for 24 hours.  ACTIVITY:  You should plan to take it easy for the rest of today and you should NOT DRIVE or use heavy machinery until tomorrow (because of the sedation medicines used during the test).     FOLLOW UP: Our staff will call the number listed on your records the next business day following your procedure to check on you and address any questions or concerns that you may have regarding the information given to you following your procedure. If we do not reach you, we will leave a message.  However, if you are feeling well and you are not experiencing any problems, there is no need to return our call.  We will assume that you have returned to your regular daily activities without incident.  If any biopsies were taken you will be contacted by phone or by letter within the next 1-3 weeks.  Please call us at [phone] if you have not heard about the biopsies in 3 weeks.  Await for biopsy results Polyps (handout given) High Fiber Diet (handout given)  SIGNATURES/CONFIDENTIALITY: You and/or your care partner have signed paperwork which will be entered into your electronic medical record.  These signatures attest to the fact that that the information above on your After Visit Summary has been reviewed and is understood.  Full responsibility of the confidentiality of this discharge information lies with you and/or your care-partner.

## 2018-10-08 ENCOUNTER — Telehealth: Payer: Self-pay

## 2018-10-08 DIAGNOSIS — M25562 Pain in left knee: Principal | ICD-10-CM

## 2018-10-08 DIAGNOSIS — G8929 Other chronic pain: Secondary | ICD-10-CM

## 2018-10-08 NOTE — Telephone Encounter (Signed)
Order placed for pt to see Dr. Noemi Chapel for left knee OA, discuss total knee replacement. Pt understands.  Appt: 10/16/2018 @ 2:30 pm.

## 2018-10-08 NOTE — Telephone Encounter (Signed)
  Follow up Call-  Call back number 10/07/2018  Post procedure Call Back phone  # [phone]  Permission to leave phone message Yes  Some recent data might be hidden     Patient questions:  Do you have a fever, pain , or abdominal swelling? No. Pain Score  0 *  Have you tolerated food without any problems? Yes.    Have you been able to return to your normal activities? Yes.    Do you have any questions about your discharge instructions: Diet   No. Medications  No. Follow up visit  No.  Do you have questions or concerns about your Care? No.  Actions: * If pain score is 4 or above: No action needed, pain <4.

## 2018-10-12 ENCOUNTER — Encounter: Payer: Self-pay | Admitting: Gastroenterology

## 2018-10-13 ENCOUNTER — Telehealth: Payer: Self-pay | Admitting: Gastroenterology

## 2018-10-13 NOTE — Telephone Encounter (Signed)
Pt had colon last week on 10/07/18. He states that his stomach has been bothering him since then. Needs some advise.

## 2018-10-13 NOTE — Telephone Encounter (Signed)
Left message on machine to call back  

## 2018-10-14 NOTE — Telephone Encounter (Signed)
I spoke with the pt and he states he is doing much better today and has no GI complaints at this time.

## 2018-10-16 ENCOUNTER — Telehealth: Payer: Self-pay

## 2018-10-16 NOTE — Telephone Encounter (Signed)
Patient will make appointment tomorrow at [facility].

## 2018-10-17 ENCOUNTER — Ambulatory Visit (INDEPENDENT_AMBULATORY_CARE_PROVIDER_SITE_OTHER): Payer: Medicaid Other | Admitting: Family Medicine

## 2018-10-17 ENCOUNTER — Ambulatory Visit: Payer: Medicaid Other | Admitting: Family Medicine

## 2018-10-17 ENCOUNTER — Encounter: Payer: Self-pay | Admitting: Family Medicine

## 2018-10-17 VITALS — BP 145/74 | HR 54 | Temp 98.0°F | Resp 16 | Ht 72.0 in | Wt 245.0 lb

## 2018-10-17 DIAGNOSIS — I10 Essential (primary) hypertension: Secondary | ICD-10-CM

## 2018-10-17 DIAGNOSIS — E785 Hyperlipidemia, unspecified: Secondary | ICD-10-CM

## 2018-10-17 DIAGNOSIS — N529 Male erectile dysfunction, unspecified: Secondary | ICD-10-CM

## 2018-10-17 DIAGNOSIS — E1169 Type 2 diabetes mellitus with other specified complication: Secondary | ICD-10-CM

## 2018-10-17 DIAGNOSIS — Z23 Encounter for immunization: Secondary | ICD-10-CM

## 2018-10-17 LAB — POCT URINALYSIS DIPSTICK
Bilirubin, UA: NEGATIVE
Blood, UA: NEGATIVE
Glucose, UA: NEGATIVE
Ketones, UA: NEGATIVE
Nitrite, UA: NEGATIVE
Protein, UA: NEGATIVE
Spec Grav, UA: 1.015 (ref 1.010–1.025)
Urobilinogen, UA: 0.2 E.U./dL
pH, UA: 5.5 (ref 5.0–8.0)

## 2018-10-17 LAB — POCT GLYCOSYLATED HEMOGLOBIN (HGB A1C): Hemoglobin A1C: 8.1 % — AB (ref 4.0–5.6)

## 2018-10-17 MED ORDER — GLIPIZIDE 5 MG PO TABS: 5.0000 mg | ORAL_TABLET | Freq: Two times a day (BID) | ORAL | 5 refills | Status: DC

## 2018-10-17 MED ORDER — METFORMIN HCL 1000 MG PO TABS: 1000.0000 mg | ORAL_TABLET | Freq: Two times a day (BID) | ORAL | 5 refills | Status: DC

## 2018-10-17 MED ORDER — SIMVASTATIN 40 MG PO TABS: 40.0000 mg | ORAL_TABLET | Freq: Every day | ORAL | 5 refills | Status: DC

## 2018-10-17 MED ORDER — TADALAFIL 20 MG PO TABS: 20.0000 mg | ORAL_TABLET | Freq: Every day | ORAL | 2 refills | Status: DC | PRN

## 2018-10-17 MED ORDER — LISINOPRIL-HYDROCHLOROTHIAZIDE 20-12.5 MG PO TABS: 1.0000 | ORAL_TABLET | Freq: Every day | ORAL | 3 refills | Status: DC

## 2018-10-17 NOTE — Patient Instructions (Addendum)
I am adding a new medication today.  This medication is called glipizide.  You will take this medication along with the metformin twice a day.   Diabetes Mellitus and Exercise Exercising regularly is important for your overall health, especially when you have diabetes (diabetes mellitus). Exercising is not only about losing weight. It has many health benefits, such as increasing muscle strength and bone density and reducing body fat and stress. This leads to improved fitness, flexibility, and endurance, all of which result in better overall health. Exercise has additional benefits for people with diabetes, including:  Reducing appetite.  Helping to lower and control blood glucose.  Lowering blood pressure.  Helping to control amounts of fatty substances (lipids) in the blood, such as cholesterol and triglycerides.  Helping the body to respond better to insulin (improving insulin sensitivity).  Reducing how much insulin the body needs.  Decreasing the risk for heart disease by: ? Lowering cholesterol and triglyceride levels. ? Increasing the levels of good cholesterol. ? Lowering blood glucose levels.  What is my activity plan? Your health care provider or certified diabetes educator can help you make a plan for the type and frequency of exercise (activity plan) that works for you. Make sure that you:  Do at least 150 minutes of moderate-intensity or vigorous-intensity exercise each week. This could be brisk walking, biking, or water aerobics. ? Do stretching and strength exercises, such as yoga or weightlifting, at least 2 times a week. ? Spread out your activity over at least 3 days of the week.  Get some form of physical activity every day. ? Do not go more than 2 days in a row without some kind of physical activity. ? Avoid being inactive for more than 90 minutes at a time. Take frequent breaks to walk or stretch.  Choose a type of exercise or activity that you enjoy, and set  realistic goals.  Start slowly, and gradually increase the intensity of your exercise over time.  What do I need to know about managing my diabetes?  Check your blood glucose before and after exercising. ? If your blood glucose is higher than 240 mg/dL (13.3 mmol/L) before you exercise, check your urine for ketones. If you have ketones in your urine, do not exercise until your blood glucose returns to normal.  Know the symptoms of low blood glucose (hypoglycemia) and how to treat it. Your risk for hypoglycemia increases during and after exercise. Common symptoms of hypoglycemia can include: ? Hunger. ? Anxiety. ? Sweating and feeling clammy. ? Confusion. ? Dizziness or feeling light-headed. ? Increased heart rate or palpitations. ? Blurry vision. ? Tingling or numbness around the mouth, lips, or tongue. ? Tremors or shakes. ? Irritability.  Keep a rapid-acting carbohydrate snack available before, during, and after exercise to help prevent or treat hypoglycemia.  Avoid injecting insulin into areas of the body that are going to be exercised. For example, avoid injecting insulin into: ? The arms, when playing tennis. ? The legs, when jogging.  Keep records of your exercise habits. Doing this can help you and your health care provider adjust your diabetes management plan as needed. Write down: ? Food that you eat before and after you exercise. ? Blood glucose levels before and after you exercise. ? The type and amount of exercise you have done. ? When your insulin is expected to peak, if you use insulin. Avoid exercising at times when your insulin is peaking.  When you start a new exercise or  activity, work with your health care provider to make sure the activity is safe for you, and to adjust your insulin, medicines, or food intake as needed.  Drink plenty of water while you exercise to prevent dehydration or heat stroke. Drink enough fluid to keep your urine clear or pale  yellow. This information is not intended to replace advice given to you by your health care provider. Make sure you discuss any questions you have with your health care provider. Document Released: 03/01/2004 Document Revised: 06/29/2016 Document Reviewed: 05/21/2016 Elsevier Interactive Patient Education  2018 [employer]. Diabetes Mellitus and Standards of Medical Care Managing diabetes (diabetes mellitus) can be complicated. Your diabetes treatment may be managed by a team of health care providers, including:  A diet and nutrition specialist (registered dietitian).  A nurse.  A certified diabetes educator (CDE).  A diabetes specialist (endocrinologist).  An eye doctor.  A primary care provider.  A dentist.  Your health care providers follow a schedule in order to help you get the best quality of care. The following schedule is a general guideline for your diabetes management plan. Your health care providers may also give you more specific instructions. HbA1c ( hemoglobin A1c) test This test provides information about blood sugar (glucose) control over the previous 2-3 months. It is used to check whether your diabetes management plan needs to be adjusted.  If you are meeting your treatment goals, this test is done at least 2 times a year.  If you are not meeting treatment goals or if your treatment goals have changed, this test is done 4 times a year.  Blood pressure test  This test is done at every routine medical visit. For most people, the goal is less than 130/80. Ask your health care provider what your goal blood pressure should be. Dental and eye exams  Visit your dentist two times a year.  If you have type 1 diabetes, get an eye exam 3-5 years after you are diagnosed, and then once a year after your first exam. ? If you were diagnosed with type 1 diabetes as a child, get an eye exam when you are age 74 or older and have had diabetes for 3-5 years. After the first  exam, you should get an eye exam once a year.  If you have type 2 diabetes, have an eye exam as soon as you are diagnosed, and then once a year after your first exam. Foot care exam  Visual foot exams are done at every routine medical visit. The exams check for cuts, bruises, redness, blisters, sores, or other problems with the feet.  A complete foot exam is done by your health care provider once a year. This exam includes an inspection of the structure and skin of your feet, and a check of the pulses and sensation in your feet. ? Type 1 diabetes: Get your first exam 3-5 years after diagnosis. ? Type 2 diabetes: Get your first exam as soon as you are diagnosed.  Check your feet every day for cuts, bruises, redness, blisters, or sores. If you have any of these or other problems that are not healing, contact your health care provider. Kidney function test ( urine microalbumin)  This test is done once a year. ? Type 1 diabetes: Get your first test 5 years after diagnosis. ? Type 2 diabetes: Get your first test as soon as you are diagnosed.  If you have chronic kidney disease (CKD), get a serum creatinine and estimated glomerular  filtration rate (eGFR) test once a year. Lipid profile (cholesterol, HDL, LDL, triglycerides)  This test should be done when you are diagnosed with diabetes, and every 5 years after the first test. If you are on medicines to lower your cholesterol, you may need to get this test done every year. ? The goal for LDL is less than 100 mg/dL (5.5 mmol/L). If you are at high risk, the goal is less than 70 mg/dL (3.9 mmol/L). ? The goal for HDL is 40 mg/dL (2.2 mmol/L) for men and 50 mg/dL(2.8 mmol/L) for women. An HDL cholesterol of 60 mg/dL (3.3 mmol/L) or higher gives some protection against heart disease. ? The goal for triglycerides is less than 150 mg/dL (8.3 mmol/L). Immunizations  The yearly flu (influenza) vaccine is recommended for everyone 6 months or older who  has diabetes.  The pneumonia (pneumococcal) vaccine is recommended for everyone 2 years or older who has diabetes. If you are 64 or older, you may get the pneumonia vaccine as a series of two separate shots.  The hepatitis B vaccine is recommended for adults shortly after they have been diagnosed with diabetes.  The Tdap (tetanus, diphtheria, and pertussis) vaccine should be given: ? According to normal childhood vaccination schedules, for children. ? Every 10 years, for adults who have diabetes.  The shingles vaccine is recommended for people who have had chicken pox and are 50 years or older. Mental and emotional health  Screening for symptoms of eating disorders, anxiety, and depression is recommended at the time of diagnosis and afterward as needed. If your screening shows that you have symptoms (you have a positive screening result), you may need further evaluation and be referred to a mental health care provider. Diabetes self-management education  Education about how to manage your diabetes is recommended at diagnosis and ongoing as needed. Treatment plan  Your treatment plan will be reviewed at every medical visit. Summary  Managing diabetes (diabetes mellitus) can be complicated. Your diabetes treatment may be managed by a team of health care providers.  Your health care providers follow a schedule in order to help you get the best quality of care.  Standards of care including having regular physical exams, blood tests, blood pressure monitoring, immunizations, screening tests, and education about how to manage your diabetes.  Your health care providers may also give you more specific instructions based on your individual health. This information is not intended to replace advice given to you by your health care provider. Make sure you discuss any questions you have with your health care provider. Document Released: 10/07/2009 Document Revised: 09/07/2016 Document Reviewed:  09/07/2016 Elsevier Interactive Patient Education  [employer].

## 2018-10-17 NOTE — Progress Notes (Signed)
[facility]   Progress Note: General Provider: Lanae Boast, FNP  SUBJECTIVE:   Kyle Merritt is a 57 y.o. male who  has a past medical history of Colon cancer ([place]), Diabetes mellitus without complication ([place]), High cholesterol, History of colon cancer, and Hypertension.. Patient presents today for Hypertension and Diabetes Patient presents today for follow-up appointment for diabetes and hypertension.  Patient states that he is doing well he has had several appointments with various specialist since his last visit at this clinic.  Patient states that on October 15 he had his colonoscopy.  He is also scheduled to have surgery on his knee for chronic pain.  He is also being treated for bursitis of his left elbow. Patient is requesting an increase in his erectile dysfunction medication today.  Review of Systems  Constitutional: Negative.   HENT: Negative.   Eyes: Negative.   Respiratory: Negative.   Cardiovascular: Negative.   Gastrointestinal: Negative.   Genitourinary: Negative.   Musculoskeletal: Negative.   Skin: Negative.   Neurological: Negative.   Psychiatric/Behavioral: Negative.      OBJECTIVE: Ht 6' (1.829 m)   Wt 245 lb (111.1 kg)   BMI 33.23 kg/m   Physical Exam  Constitutional: He is oriented to person, place, and time. He appears well-developed and well-nourished. No distress.  HENT:  Head: Normocephalic and atraumatic.  Eyes: Pupils are equal, round, and reactive to light. Conjunctivae and EOM are normal.  Neck: Normal range of motion.  Cardiovascular: Normal rate, regular rhythm, normal heart sounds and intact distal pulses.  Pulmonary/Chest: Effort normal and breath sounds normal. No respiratory distress.  Abdominal: Soft. Bowel sounds are normal. He exhibits no distension.  Musculoskeletal: Normal range of motion.  Neurological: He is alert and oriented to person, place, and time.  Skin: Skin is warm and  dry.  Psychiatric: He has a normal mood and affect. His behavior is normal. Thought content normal.  Nursing note and vitals reviewed.   ASSESSMENT/PLAN:  1. Essential hypertension - lisinopril-hydrochlorothiazide (PRINZIDE,ZESTORETIC) 20-12.5 MG tablet; Take 1 tablet by mouth daily.  Dispense: 90 tablet; Refill: 3  2. Type 2 diabetes mellitus with other specified complication, without long-term current use of insulin (HCC) Added glipizide 5 mg BID. Continue with metformin as prescribed.  - HgB A1c - Urinalysis Dipstick - simvastatin (ZOCOR) 40 MG tablet; Take 1 tablet (40 mg total) by mouth daily.  Dispense: 30 tablet; Refill: 5 - metFORMIN (GLUCOPHAGE) 1000 MG tablet; Take 1 tablet (1,000 mg total) by mouth 2 (two) times daily with a meal.  Dispense: 60 tablet; Refill: 5 - glipiZIDE (GLUCOTROL) 5 MG tablet; Take 1 tablet (5 mg total) by mouth 2 (two) times daily before a meal.  Dispense: 60 tablet; Refill: 5  3. Hyperlipidemia, unspecified hyperlipidemia type Patient is not fasting today.  - simvastatin (ZOCOR) 40 MG tablet; Take 1 tablet (40 mg total) by mouth daily.  Dispense: 30 tablet; Refill: 5  4. Erectile dysfunction, unspecified erectile dysfunction type - tadalafil (ADCIRCA/CIALIS) 20 MG tablet; Take 1 tablet (20 mg total) by mouth daily as needed for erectile dysfunction.  Dispense: 10 tablet; Refill: 2  Influenza vaccination given in the office visit today.      The patient was given clear instructions to go to ER or return to medical center if symptoms do not improve, worsen or new problems develop. The patient verbalized understanding and agreed with plan of care.   Ms. [name]. Nathaneil Canary, FNP-BC Patient  [facility] [place], [place] [address] [phone]     This note has been created with Dragon speech recognition software and smart phrase technology. Any transcriptional errors are unintentional.

## 2018-10-31 ENCOUNTER — Ambulatory Visit: Payer: Medicaid Other | Admitting: Family Medicine

## 2018-11-06 ENCOUNTER — Other Ambulatory Visit: Payer: Self-pay | Admitting: Physician Assistant

## 2018-11-06 DIAGNOSIS — S93491A Sprain of other ligament of right ankle, initial encounter: Secondary | ICD-10-CM | POA: Diagnosis not present

## 2018-11-06 DIAGNOSIS — M170 Bilateral primary osteoarthritis of knee: Secondary | ICD-10-CM | POA: Diagnosis not present

## 2018-11-06 DIAGNOSIS — M1711 Unilateral primary osteoarthritis, right knee: Secondary | ICD-10-CM | POA: Diagnosis not present

## 2018-11-07 MED FILL — metFORMIN HCL 1000 MG TABS: 1000 | 30 days supply | Qty: 60 | Fill #0

## 2018-11-07 MED FILL — !VIAGRA 25 MG TABLET: 25 | 30 days supply | Qty: 10 | Fill #1

## 2018-11-07 MED FILL — LISINOPRIL-HCTZ 20-12.5 MG: 20-12.5 | 30 days supply | Qty: 30 | Fill #0

## 2018-11-07 MED FILL — GABAPENTIN 300 MG CAPSULE: 300 | 30 days supply | Qty: 120 | Fill #1

## 2018-11-11 DIAGNOSIS — M1711 Unilateral primary osteoarthritis, right knee: Secondary | ICD-10-CM | POA: Diagnosis not present

## 2018-11-14 ENCOUNTER — Encounter: Payer: Self-pay | Admitting: Gastroenterology

## 2018-11-14 NOTE — Progress Notes (Signed)
Review of outside records to be scanned into chart  April 2017 colonoscopy with biopsy of sigmoid colon polyp showing benign polypoid: Mucosa most likely lymphoid aggregate  Colonoscopy preparation [place] bowel prep of 9 Moderate pancolonic diverticulosis Tiny sigmoid polyp removed via cold biopsy forceps Prominent nonbleeding internal hemorrhoids   Justice Britain, MD [facility] Advanced Endoscopy Office # [phone]

## 2018-11-26 ENCOUNTER — Ambulatory Visit (INDEPENDENT_AMBULATORY_CARE_PROVIDER_SITE_OTHER): Payer: Medicaid Other | Admitting: Family Medicine

## 2018-11-26 VITALS — BP 153/81 | HR 56 | Temp 97.8°F | Resp 16 | Ht 72.0 in | Wt 255.0 lb

## 2018-11-26 DIAGNOSIS — N50812 Left testicular pain: Secondary | ICD-10-CM

## 2018-11-26 DIAGNOSIS — I861 Scrotal varices: Secondary | ICD-10-CM

## 2018-11-26 DIAGNOSIS — N50819 Testicular pain, unspecified: Secondary | ICD-10-CM

## 2018-11-26 LAB — POCT URINALYSIS DIP (CLINITEK)
Bilirubin, UA: NEGATIVE
Blood, UA: NEGATIVE
Glucose, UA: NEGATIVE mg/dL
Ketones, POC UA: NEGATIVE mg/dL
Nitrite, UA: NEGATIVE
POC PROTEIN,UA: NEGATIVE
Spec Grav, UA: 1.02 (ref 1.010–1.025)
Urobilinogen, UA: 0.2 E.U./dL
pH, UA: 5.5 (ref 5.0–8.0)

## 2018-11-26 MED ORDER — IBUPROFEN 800 MG PO TABS: 800.0000 mg | ORAL_TABLET | Freq: Three times a day (TID) | ORAL | 0 refills | Status: DC | PRN

## 2018-11-26 MED ORDER — SILDENAFIL CITRATE 25 MG PO TABS: 25.0000 mg | ORAL_TABLET | Freq: Every day | ORAL | 0 refills | Status: DC | PRN

## 2018-11-26 MED ORDER — AZITHROMYCIN 500 MG PO TABS: 1000.0000 mg | ORAL_TABLET | Freq: Once | ORAL | 0 refills | Status: AC

## 2018-11-26 MED ORDER — CEFTRIAXONE SODIUM 250 MG IJ SOLR
250.0000 mg | Freq: Once | INTRAMUSCULAR | Status: AC
  Administered 2018-11-26: 250 mg via INTRAMUSCULAR

## 2018-11-26 MED FILL — AZITHROMYCIN 500 MG TABLET: 500 | 1 days supply | Qty: 2 | Fill #0

## 2018-11-26 MED FILL — IBUPROFEN 800 MG TABLET: 800 | 10 days supply | Qty: 30 | Fill #0

## 2018-11-26 NOTE — Patient Instructions (Signed)
Varicocele A varicocele is a swelling of veins in the scrotum. The scrotum is the sac that contains the testicles. Varicoceles can occur on either side of the scrotum, but they are more common on the left side. They occur most often in teenage boys and young men. In most cases, varicoceles are not a serious problem. They are usually small and painless and do not require treatment. Tests may be done to confirm the diagnosis. Treatment may be needed if:  A varicocele is large, causes a lot of pain, or causes pain when exercising.  Varicoceles are found on both sides of the scrotum.  The testicle on the opposite side is absent or not normal.  A varicocele causes a decrease in the size of the testicle in a growing adolescent.  The person has fertility problems.  What are the causes? This condition is the result of valves in the veins not working properly. Valves in the veins help to return blood from the scrotum and testicles to the heart. If these valves do not work well, blood flows backward and backs up into the veins, which causes the veins to swell. This is similar to what happens when varicose veins form in the leg. What are the signs or symptoms? Most varicoceles do not cause any symptoms. If symptoms do occur, they may include:  Swelling on one side of the scrotum. The swelling may be more obvious when you are standing up.  A lumpy feeling in the scrotum.  A heavy feeling on one side of the scrotum.  A dull ache in the scrotum, especially after exercise or prolonged standing or sitting.  Slower growth or reduced size of the testicle on the side of the varicocele (in young males).  Problems with fertility. These can occur if the testicle does not grow normally.  How is this diagnosed? This condition may be diagnosed with a physical exam. You may also have an imaging test, called an ultrasound, to confirm the diagnosis and to help rule out other causes of the swelling. How is this  treated? Treatment is usually not needed for this condition. If you have any pain, your health care provider may prescribe or recommend medicine to help relieve it. You may need regular exams so your health care provider can monitor the varicocele to ensure that it does not cause problems. When further treatment is needed, it may involve one of these options:  Varicocelectomy. This is a surgery in which the swollen veins are tied off so that the flow of blood goes to other veins instead.  Embolization. In this procedure, a small tube (catheter) is used to place metal coils or other blocking items in the veins. This cuts off the blood flow to the swollen veins.  Follow these instructions at home:  Take medicines only as directed by your health care provider.  Wear supportive underwear.  Use an athletic supporter for sports.  Keep all follow-up visits as directed by your health care provider. This is important. Contact a health care provider if:  Your pain is increasing.  You have redness in the affected area.  You have swelling that does not decrease when you are lying down.  One of your testicles is smaller than the other.  Your testicle becomes enlarged, swollen, or painful. This information is not intended to replace advice given to you by your health care provider. Make sure you discuss any questions you have with your health care provider. Document Released: 03/18/2001 Document Revised: 05/23/2016  Document Reviewed: 11/17/2014 Elsevier Interactive Patient Education  [employer].

## 2018-11-26 NOTE — Progress Notes (Signed)
Acute Office Visit  Subjective:    Patient ID: Kyle Merritt, male    DOB: September 06, 1961, 57 y.o.   MRN: [id number]  Chief Complaint  Patient presents with  . Groin Pain  . Testicle Pain  . Erectile Dysfunction    HPI Patient is in today for testicular pain x 2 weeks. Denies dysuria, discharge or rash. Patient states that he has lifted heavy objects in the past few weeks. No pain or swelling noted prior to lifting. Patient also states that he is also having difficulty with erection Has tried viagra and cialis. Patient denies fever chills and night sweats. Patient reports 1 male partner in the past 3 months. Reports 100% condom use.   Past Medical History:  Diagnosis Date  . Colon cancer ([place])   . Diabetes mellitus without complication ([place])   . High cholesterol   . History of colon cancer   . Hypertension     Past Surgical History:  Procedure Laterality Date  . COLON SURGERY    . KNEE ARTHROSCOPY Bilateral     Family History  Problem Relation Age of Onset  . Diabetes Mother   . Hypertension Mother   . Colon cancer Paternal Uncle   . Esophageal cancer Neg Hx   . Inflammatory bowel disease Neg Hx   . Liver disease Neg Hx   . Pancreatic cancer Neg Hx   . Stomach cancer Neg Hx     Social History   Socioeconomic History  . Marital status: Divorced    Spouse name: Not on file  . Number of children: Not on file  . Years of education: Not on file  . Highest education level: Not on file  Occupational History  . Not on file  Social Needs  . Financial resource strain: Not on file  . Food insecurity:    Worry: Not on file    Inability: Not on file  . Transportation needs:    Medical: Not on file    Non-medical: Not on file  Tobacco Use  . Smoking status: Former Smoker    Last attempt to quit: 2018    Years since quitting: 1.9  . Smokeless tobacco: Never Used  Substance and Sexual Activity  . Alcohol use: No  . Drug use: No  . Sexual activity: Yes  Lifestyle    . Physical activity:    Days per week: Not on file    Minutes per session: Not on file  . Stress: Not on file  Relationships  . Social connections:    Talks on phone: Not on file    Gets together: Not on file    Attends religious service: Not on file    Active member of club or organization: Not on file    Attends meetings of clubs or organizations: Not on file    Relationship status: Not on file  . Intimate partner violence:    Fear of current or ex partner: Not on file    Emotionally abused: Not on file    Physically abused: Not on file    Forced sexual activity: Not on file  Other Topics Concern  . Not on file  Social History Narrative  . Not on file    Outpatient Medications Prior to Visit  Medication Sig Dispense Refill  . gabapentin (NEURONTIN) 300 MG capsule Take 1 capsule (300 mg total) by mouth 4 (four) times daily. 120 capsule 3  . glipiZIDE (GLUCOTROL) 5 MG tablet Take 1 tablet (5 mg total)  by mouth 2 (two) times daily before a meal. 60 tablet 5  . lisinopril-hydrochlorothiazide (PRINZIDE,ZESTORETIC) 20-12.5 MG tablet Take 1 tablet by mouth daily. 90 tablet 3  . metFORMIN (GLUCOPHAGE) 1000 MG tablet Take 1 tablet (1,000 mg total) by mouth 2 (two) times daily with a meal. 60 tablet 5  . simvastatin (ZOCOR) 40 MG tablet Take 1 tablet (40 mg total) by mouth daily. 30 tablet 5  . tadalafil (ADCIRCA/CIALIS) 20 MG tablet Take 1 tablet (20 mg total) by mouth daily as needed for erectile dysfunction. 10 tablet 2   Facility-Administered Medications Prior to Visit  Medication Dose Route Frequency Provider Last Rate Last Dose  . 0.9 %  sodium chloride infusion  500 mL Intravenous Once Mansouraty, Telford Nab., MD      . methylPREDNISolone acetate (DEPO-MEDROL) injection 40 mg  40 mg Intra-articular Once Hudnall, Sharyn Lull, MD        No Known Allergies  Review of Systems  Gastrointestinal: Negative for abdominal pain.  Genitourinary: Negative for dysuria and hematuria.        Testicular pain and swelling.   Psychiatric/Behavioral: Negative.   All other systems reviewed and are negative.      Objective:    Physical Exam  Constitutional: He is oriented to person, place, and time. He appears well-developed and well-nourished. No distress.  HENT:  Head: Normocephalic and atraumatic.  Eyes: Pupils are equal, round, and reactive to light. Conjunctivae and EOM are normal.  Neck: Normal range of motion.  Cardiovascular: Normal rate, regular rhythm and normal heart sounds.  Pulmonary/Chest: Effort normal and breath sounds normal. No respiratory distress.  Abdominal: Hernia confirmed negative in the right inguinal area and confirmed negative in the left inguinal area.  Genitourinary:    Right testis shows swelling. Left testis shows swelling. Circumcised. No penile erythema or penile tenderness. No discharge found.  Musculoskeletal: Normal range of motion.  Neurological: He is alert and oriented to person, place, and time.  Skin: Skin is warm and dry.  Psychiatric: He has a normal mood and affect. His behavior is normal. Judgment and thought content normal.  Nursing note and vitals reviewed.   BP (!) 153/81 (BP Location: Right Arm, Patient Position: Sitting, Cuff Size: Large)   Pulse (!) 56   Temp 97.8 F (36.6 C) (Oral)   Resp 16   Ht 6' (1.829 m)   Wt 255 lb (115.7 kg)   SpO2 100%   BMI 34.58 kg/m  Wt Readings from Last 3 Encounters:  11/26/18 255 lb (115.7 kg)  10/17/18 245 lb (111.1 kg)  10/07/18 247 lb (112 kg)    There are no preventive care reminders to display for this patient.  There are no preventive care reminders to display for this patient.   Lab Results  Component Value Date   TSH 0.463 02/03/2010   Lab Results  Component Value Date   WBC 5.9 10/06/2018   HGB 11.9 (L) 10/06/2018   HCT 36.6 (L) 10/06/2018   MCV 80.2 10/06/2018   PLT 252.0 10/06/2018   Lab Results  Component Value Date   NA 142 07/30/2018   K 4.6  07/30/2018   CO2 21 07/30/2018   GLUCOSE 157 (H) 07/30/2018   BUN 27 (H) 07/30/2018   CREATININE 1.54 (H) 07/30/2018   BILITOT 0.4 07/30/2018   ALKPHOS 84 07/30/2018   AST 17 07/30/2018   ALT 13 07/30/2018   PROT 7.4 07/30/2018   ALBUMIN 4.4 07/30/2018   CALCIUM 9.7 07/30/2018  ANIONGAP 13 03/20/2018   Lab Results  Component Value Date   CHOL 176 01/29/2018   Lab Results  Component Value Date   HDL 74 01/29/2018   Lab Results  Component Value Date   LDLCALC 82 01/29/2018   Lab Results  Component Value Date   TRIG 102 01/29/2018   Lab Results  Component Value Date   CHOLHDL 2.4 01/29/2018   Lab Results  Component Value Date   HGBA1C 8.1 (A) 10/17/2018       Assessment & Plan:   Problem List Items Addressed This Visit    None    Visit Diagnoses    Varicocele    -  Primary   Relevant Medications   ibuprofen (ADVIL,MOTRIN) 800 MG tablet   sildenafil (VIAGRA) 25 MG tablet   Testicular pain       Relevant Medications   azithromycin (ZITHROMAX) 500 MG tablet   cefTRIAXone (ROCEPHIN) injection 250 mg (Completed)   Other Relevant Orders   Chlamydia/GC NAA, Confirmation   US Scrotum   POCT URINALYSIS DIP (CLINITEK) (Completed)   US SCROTUM W/DOPPLER       Meds ordered this encounter  Medications  . ibuprofen (ADVIL,MOTRIN) 800 MG tablet    Sig: Take 1 tablet (800 mg total) by mouth every 8 (eight) hours as needed.    Dispense:  30 tablet    Refill:  0  . azithromycin (ZITHROMAX) 500 MG tablet    Sig: Take 2 tablets (1,000 mg total) by mouth once for 1 dose.    Dispense:  2 tablet    Refill:  0  . cefTRIAXone (ROCEPHIN) injection 250 mg  . sildenafil (VIAGRA) 25 MG tablet    Sig: Take 1 tablet (25 mg total) by mouth daily as needed for erectile dysfunction.    Dispense:  10 tablet    Refill:  0    The patient was given clear instructions to go to ER or return to medical center if symptoms do not improve, worsen or new problems develop. The patient  verbalized understanding and agreed with plan of care.     Lanae Boast, FNP

## 2018-11-28 ENCOUNTER — Ambulatory Visit (HOSPITAL_COMMUNITY): Payer: Medicaid Other

## 2018-11-28 LAB — CHLAMYDIA/GC NAA, CONFIRMATION
Chlamydia trachomatis, NAA: NEGATIVE
Neisseria gonorrhoeae, NAA: NEGATIVE

## 2018-12-01 ENCOUNTER — Ambulatory Visit (HOSPITAL_COMMUNITY)
Admission: RE | Admit: 2018-12-01 | Discharge: 2018-12-01 | Disposition: A | Discharge status: Home / Self Care | Payer: Medicaid Other | Source: Ambulatory Visit | Attending: Family Medicine | Admitting: Family Medicine

## 2018-12-01 DIAGNOSIS — N503 Cyst of epididymis: Secondary | ICD-10-CM | POA: Insufficient documentation

## 2018-12-01 DIAGNOSIS — N433 Hydrocele, unspecified: Secondary | ICD-10-CM | POA: Diagnosis not present

## 2018-12-01 DIAGNOSIS — N50819 Testicular pain, unspecified: Secondary | ICD-10-CM | POA: Insufficient documentation

## 2018-12-01 DIAGNOSIS — I861 Scrotal varices: Secondary | ICD-10-CM | POA: Diagnosis not present

## 2018-12-04 ENCOUNTER — Telehealth: Payer: Self-pay

## 2018-12-04 NOTE — Telephone Encounter (Signed)
Patient is asking about ultrasound results. Thanks!

## 2018-12-05 ENCOUNTER — Telehealth: Payer: Self-pay

## 2018-12-05 MED ORDER — SILDENAFIL CITRATE 50 MG PO TABS: 50.0000 mg | ORAL_TABLET | Freq: Every day | ORAL | 2 refills | Status: DC | PRN

## 2018-12-05 NOTE — Telephone Encounter (Signed)
Patient called and is asking if he can gave the viagra now since his ultrasound results? He is asking for the 100mg  and wants this sent to [facility] [address]

## 2018-12-05 NOTE — Telephone Encounter (Signed)
Called and spoke with patient advised that ultrasound showed fluid in the testicles and varicose veins in the scrotum. Advised that he needed to ice his scrotum and to wear a jock strap. Advised that this can take weeks to resolve. To keep next appointment and call if he needs anything before that.

## 2018-12-05 NOTE — Telephone Encounter (Signed)
Please inform patient the ultrasound showed that there was fluid noted to the testicles and varicose veins in the scrotum. He will need to continue to ice his scrotum and to wear a jock strap. It can take weeks for this to resolve.

## 2018-12-11 MED FILL — metFORMIN HCL 1000 MG TABS: 1000 | 30 days supply | Qty: 60 | Fill #1

## 2018-12-11 MED FILL — !VIAGRA 25 MG TABLET: 25 | 30 days supply | Qty: 10 | Fill #0

## 2018-12-11 MED FILL — SIMVASTATIN 40 MG TABLET: 40 | 30 days supply | Qty: 30 | Fill #0

## 2018-12-12 MED FILL — LISINOPRIL-HCTZ 20-12.5 MG: 20-12.5 | 30 days supply | Qty: 30 | Fill #1

## 2018-12-18 IMAGING — DX DG ELBOW COMPLETE 3+V*L*
1 series · 4 of 4 positions shown · non-contrast
Comparison: 05/25/2018

CLINICAL DATA: Recurrent left elbow pain and swelling. Fluid
drained multiple times.

EXAM:
LEFT ELBOW - COMPLETE 3+ VIEW

[Series 1: elbow · 0.14mm/px · 4 of 4 slices shown]
[im 1/4]
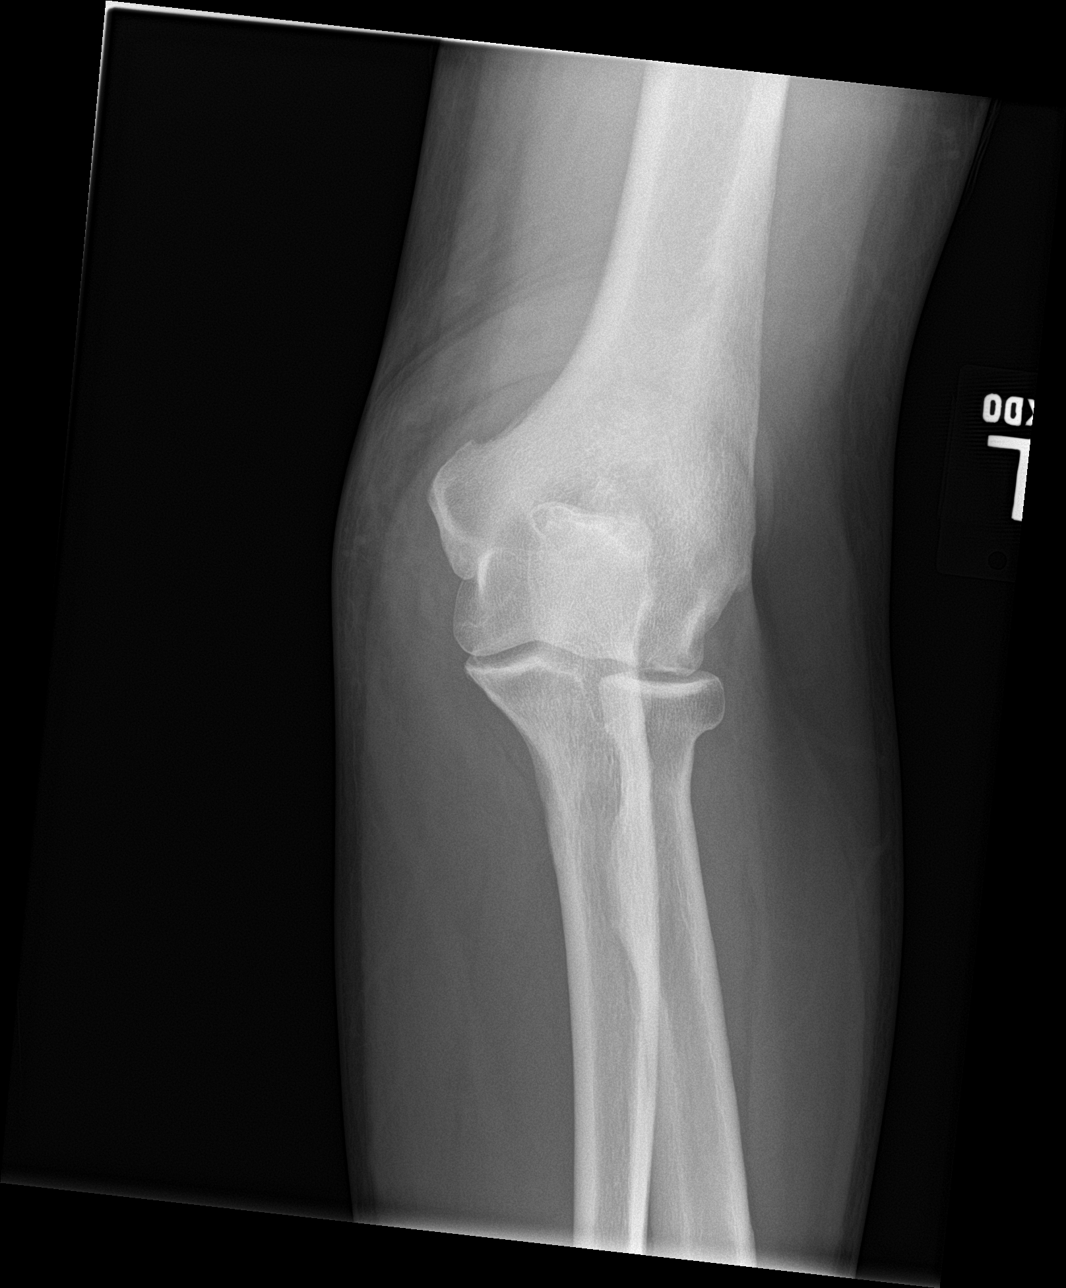
[im 2/4]
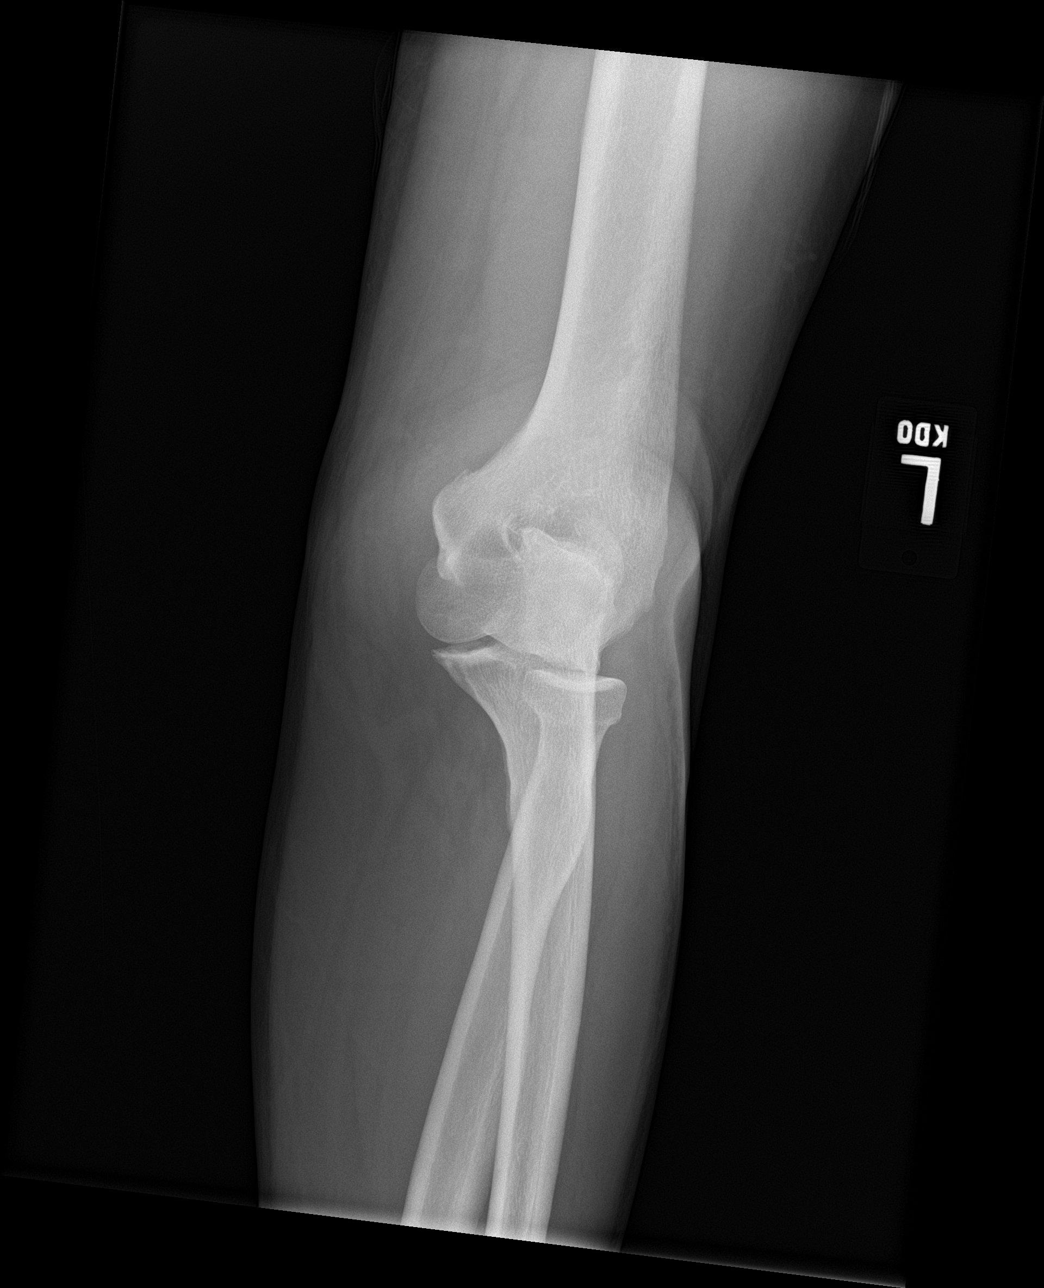
[im 3/4]
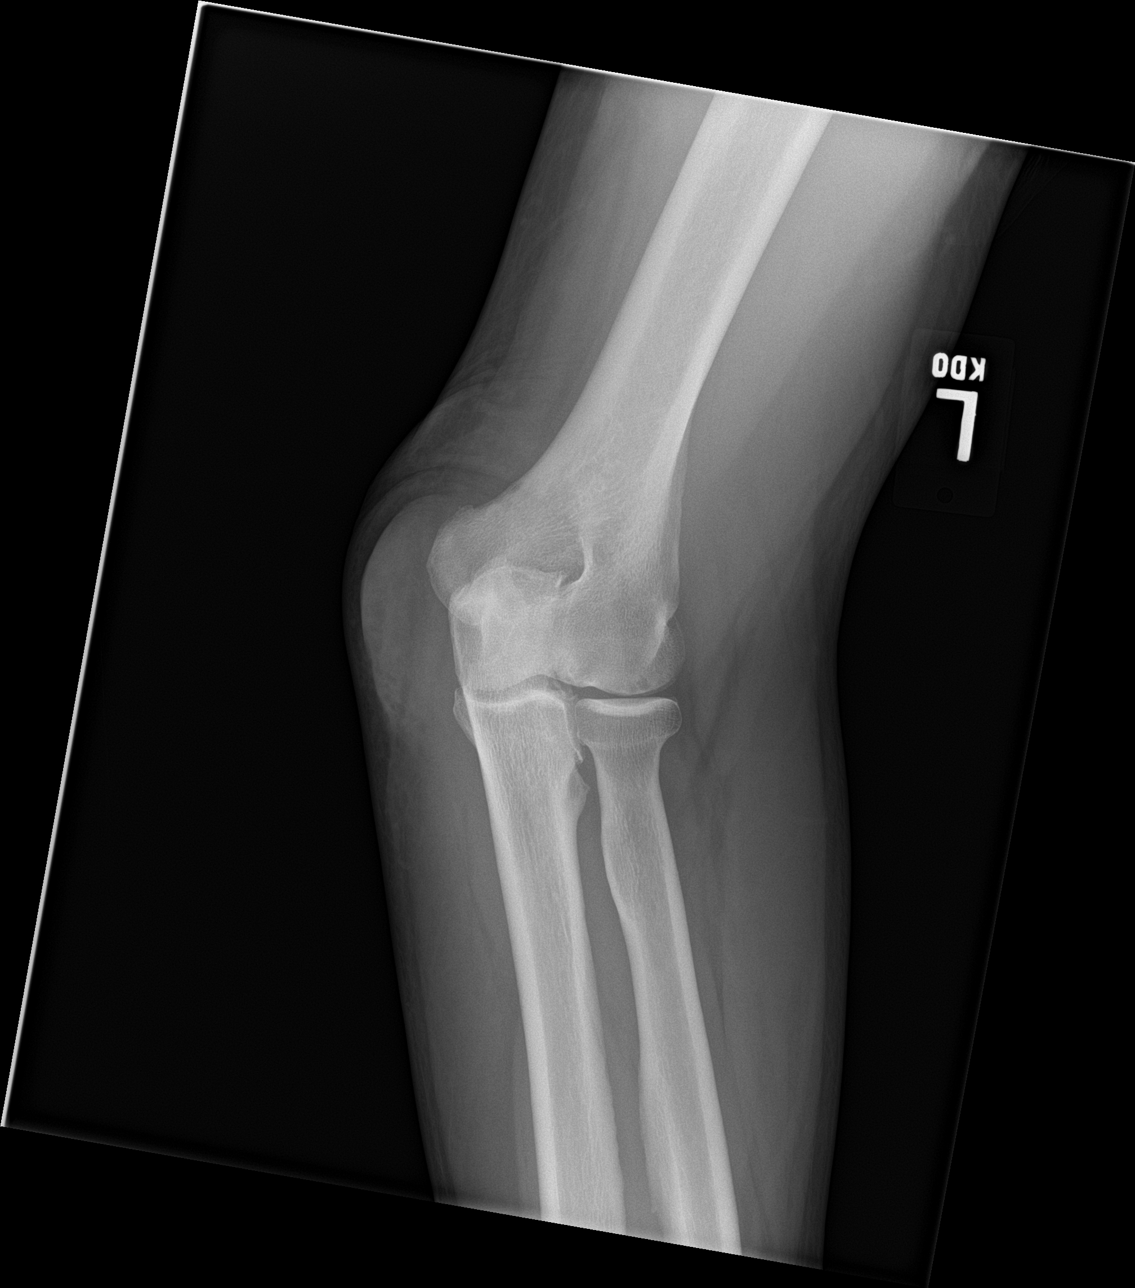
[im 4/4]
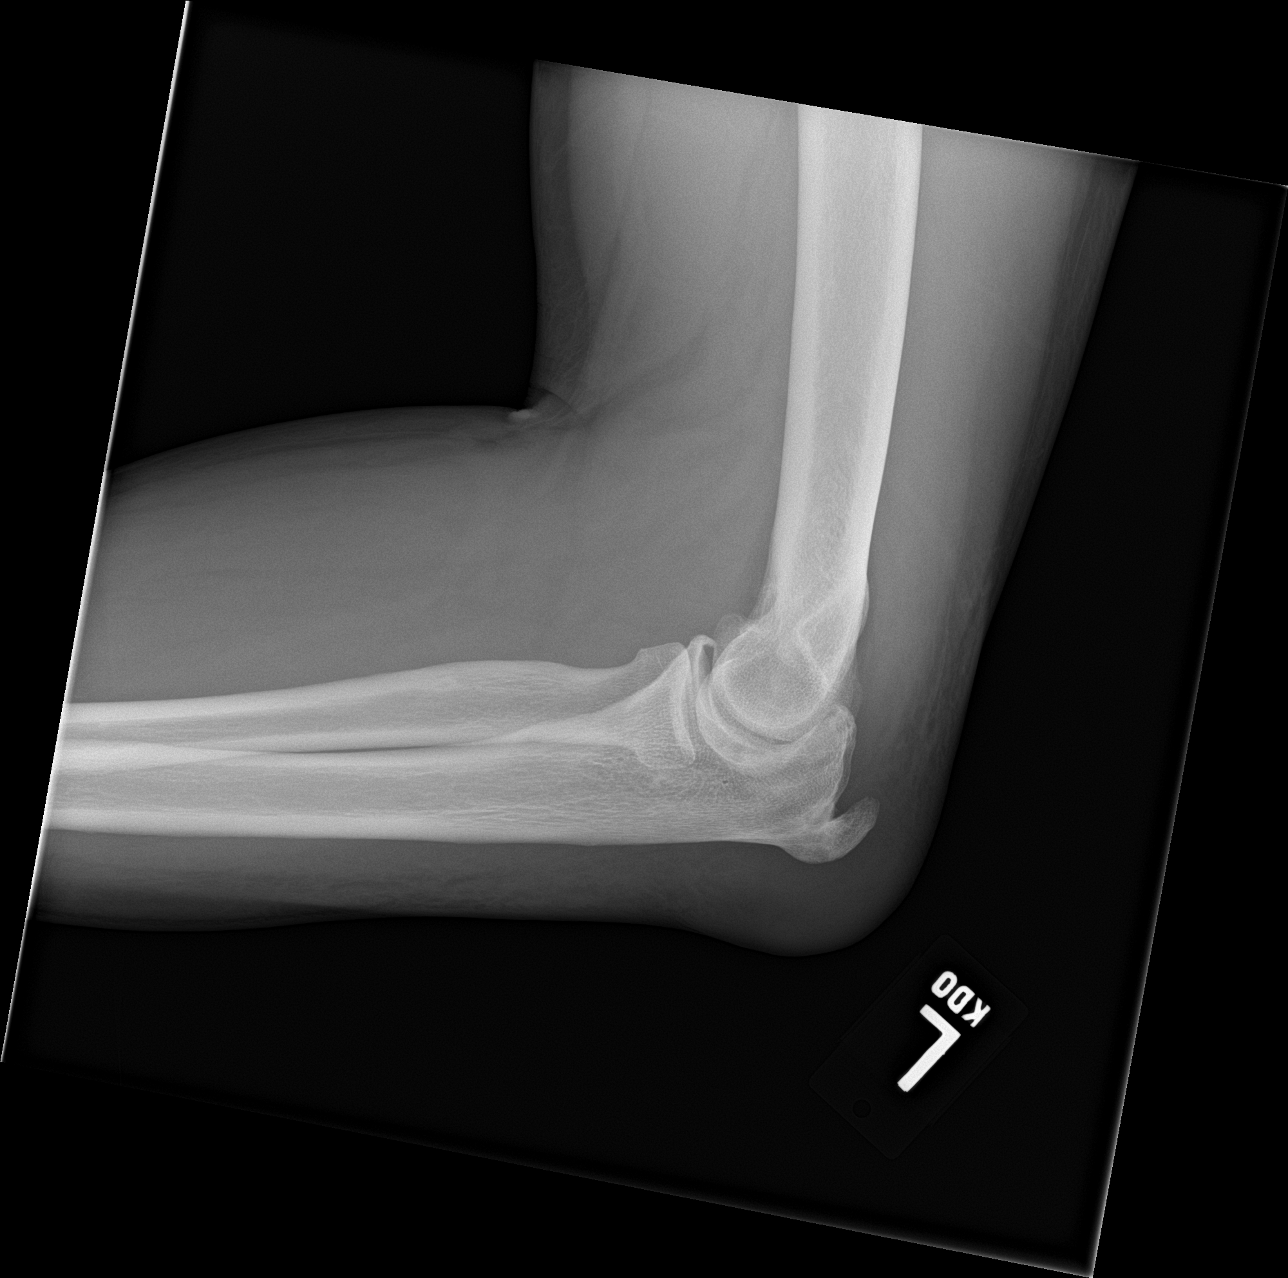

[4 of 4 positions shown; findings below may reference images not displayed]

FINDINGS: No evidence of acute fracture or dislocation. Mild soft tissue
swelling along the center surface of the elbow which may related to
olecranon bursitis. Prominent enthesophyte over the olecranon.
IMPRESSION: No acute fracture.

Soft tissue swelling over the extensor surface of the elbow with
prominent enthesophyte of the olecranon. Findings may be due to
olecranon bursitis.

## 2019-01-13 ENCOUNTER — Telehealth: Payer: Self-pay

## 2019-01-13 MED ORDER — SILDENAFIL CITRATE 50 MG PO TABS: 50.0000 mg | ORAL_TABLET | Freq: Every day | ORAL | 2 refills | Status: DC | PRN

## 2019-01-13 MED FILL — metFORMIN HCL 1000 MG TABS: 1000 | 30 days supply | Qty: 60 | Fill #2

## 2019-01-13 MED FILL — SIMVASTATIN 40 MG TABLET: 40 | 30 days supply | Qty: 30 | Fill #1

## 2019-01-13 MED FILL — LISINOPRIL-HCTZ 20-12.5 MG: 20-12.5 | 30 days supply | Qty: 30 | Fill #2

## 2019-01-13 NOTE — Telephone Encounter (Signed)
Refill sent into pharmacy. Thanks!  

## 2019-01-19 ENCOUNTER — Encounter: Payer: Self-pay | Admitting: Family Medicine

## 2019-01-19 ENCOUNTER — Other Ambulatory Visit: Payer: Self-pay

## 2019-01-19 ENCOUNTER — Telehealth: Payer: Self-pay

## 2019-01-19 ENCOUNTER — Ambulatory Visit (INDEPENDENT_AMBULATORY_CARE_PROVIDER_SITE_OTHER): Payer: Medicaid Other | Admitting: Family Medicine

## 2019-01-19 VITALS — BP 132/86 | HR 86 | Temp 98.1°F | Resp 16 | Ht 72.0 in | Wt 255.0 lb

## 2019-01-19 DIAGNOSIS — E1169 Type 2 diabetes mellitus with other specified complication: Secondary | ICD-10-CM

## 2019-01-19 DIAGNOSIS — M1712 Unilateral primary osteoarthritis, left knee: Secondary | ICD-10-CM | POA: Diagnosis not present

## 2019-01-19 DIAGNOSIS — I10 Essential (primary) hypertension: Secondary | ICD-10-CM

## 2019-01-19 DIAGNOSIS — R002 Palpitations: Secondary | ICD-10-CM

## 2019-01-19 DIAGNOSIS — M25462 Effusion, left knee: Secondary | ICD-10-CM | POA: Diagnosis not present

## 2019-01-19 LAB — POCT URINALYSIS DIPSTICK
Bilirubin, UA: NEGATIVE
Blood, UA: NEGATIVE
Glucose, UA: NEGATIVE
Ketones, UA: NEGATIVE
Nitrite, UA: NEGATIVE
Protein, UA: NEGATIVE
Spec Grav, UA: 1.015 (ref 1.010–1.025)
Urobilinogen, UA: 0.2 E.U./dL
pH, UA: 5.5 (ref 5.0–8.0)

## 2019-01-19 LAB — POCT GLYCOSYLATED HEMOGLOBIN (HGB A1C): Hemoglobin A1C: 8 % — AB (ref 4.0–5.6)

## 2019-01-19 MED ORDER — ATORVASTATIN CALCIUM 40 MG PO TABS: 40.0000 mg | ORAL_TABLET | Freq: Every day | ORAL | 3 refills | Status: DC

## 2019-01-19 MED ORDER — SAXAGLIPTIN HCL 5 MG PO TABS: 5.0000 mg | ORAL_TABLET | Freq: Every day | ORAL | 5 refills | Status: DC

## 2019-01-19 MED FILL — ATORVASTATIN CALCIUM 40 MG: 40 | 30 days supply | Qty: 30 | Fill #0

## 2019-01-19 MED FILL — ONGLYZA 5 MG TABLET: 5 | 30 days supply | Qty: 30 | Fill #0

## 2019-01-19 NOTE — Progress Notes (Signed)
Patient [facility]   Progress Note: General Provider: Lanae Boast, FNP  SUBJECTIVE:   Kyle Merritt is a 58 y.o. male who  has a past medical history of Colon cancer ([place]), Diabetes mellitus without complication ([place]), High cholesterol, History of colon cancer, and Hypertension.. Patient presents today for Hypertension; Follow-up (wants HGBA1C checked. States he doesn't need refills ); and Diabetes Patient states that he is to have right knee replacement surgery in the near future.  Orthopedist would like his A1c to be 7.5 or less.  At the last visit it was 8.1.  Today 8.0.  Patient states that he has not been taking his glipizide due to not knowing it was at the pharmacy for him.  Patient denies chest pain shortness of breath dizziness or leg swelling.  Denies side effects of medications.  Review of Systems  Constitutional: Negative.   HENT: Negative.   Eyes: Negative.   Respiratory: Negative.   Cardiovascular: Negative.   Gastrointestinal: Negative.   Genitourinary: Negative.   Musculoskeletal: Positive for joint pain.  Skin: Negative.   Neurological: Negative.   Psychiatric/Behavioral: Negative.      OBJECTIVE: BP 132/86 (BP Location: Left Arm, Patient Position: Sitting, Cuff Size: Large)   Pulse 86   Temp 98.1 F (36.7 C) (Oral)   Resp 16   Ht 6' (1.829 m)   Wt 255 lb (115.7 kg)   SpO2 100%   BMI 34.58 kg/m   Wt Readings from Last 3 Encounters:  01/19/19 255 lb (115.7 kg)  11/26/18 255 lb (115.7 kg)  10/17/18 245 lb (111.1 kg)     Physical Exam Vitals signs and nursing note reviewed.  Constitutional:      General: He is not in acute distress.    Appearance: He is well-developed.  HENT:     Head: Normocephalic and atraumatic.  Eyes:     Conjunctiva/sclera: Conjunctivae normal.     Pupils: Pupils are equal, round, and reactive to light.  Neck:     Musculoskeletal: Normal range of motion.  Cardiovascular:     Rate  and Rhythm: Normal rate. Rhythm irregular.     Heart sounds: Normal heart sounds.  Pulmonary:     Effort: Pulmonary effort is normal. No respiratory distress.     Breath sounds: Normal breath sounds.  Abdominal:     General: Bowel sounds are normal. There is no distension.     Palpations: Abdomen is soft.  Musculoskeletal: Normal range of motion.  Skin:    General: Skin is warm and dry.  Neurological:     Mental Status: He is alert and oriented to person, place, and time.  Psychiatric:        Behavior: Behavior normal.        Thought Content: Thought content normal.     ASSESSMENT/PLAN:   1. Hypertension, unspecified type EKG- pre surgery evaluation and irregular heart beat auscultated today in office.  - Urinalysis Dipstick  2. Type 2 diabetes mellitus with other specified complication, without long-term current use of insulin ([place]) Start onglyza. Will continue metformin. Goal is under 7.0 Ortho would like 7.5 prior to surgery.  The patient is asked to make an attempt to improve diet and exercise patterns to aid in medical management of this problem.  - HgB A1c - saxagliptin HCl (ONGLYZA) 5 MG TABS tablet; Take 1 tablet (5 mg total) by mouth daily.  Dispense: 30 tablet; Refill: 5 - atorvastatin (LIPITOR) 40 MG tablet; Take 1  tablet (40 mg total) by mouth daily.  Dispense: 90 tablet; Refill: 3  3. Palpitation Prior PVC noted in past medical record. No symptoms today.    Return in about 4 weeks (around 02/16/2019) for DM- new meds.    The patient was given clear instructions to go to ER or return to medical center if symptoms do not improve, worsen or new problems develop. The patient verbalized understanding and agreed with plan of care.   Ms. [name]. Nathaneil Canary, FNP-BC [facility] [address] [place],  [address] [phone]

## 2019-01-19 NOTE — Patient Instructions (Addendum)
Stop simvastatin and start atorvastatin 40 mg for cholesterol. Also continue with metformin. Add onglyza every morning with breakfast.    Atorvastatin tablets What is this medicine? ATORVASTATIN (a TORE va sta tin) is known as a HMG-CoA reductase inhibitor or 'statin'. It lowers the level of cholesterol and triglycerides in the blood. This drug may also reduce the risk of heart attack, stroke, or other health problems in patients with risk factors for heart disease. Diet and lifestyle changes are often used with this drug. This medicine may be used for other purposes; ask your health care provider or pharmacist if you have questions. COMMON BRAND NAME(S): Lipitor What should I tell my health care provider before I take this medicine? They need to know if you have any of these conditions: -diabetes -if you often drink alcohol -history of stroke -kidney disease -liver disease -muscle aches or weakness -thyroid disease -an unusual or allergic reaction to atorvastatin, other medicines, foods, dyes, or preservatives -pregnant or trying to get pregnant -breast-feeding How should I use this medicine? Take this medicine by mouth with a glass of water. Follow the directions on the prescription label. You can take it with or without food. If it upsets your stomach, take it with food. Do not take with grapefruit juice. Take your medicine at regular intervals. Do not take it more often than directed. Do not stop taking except on your doctor's advice. Talk to your pediatrician regarding the use of this medicine in children. While this drug may be prescribed for children as young as 10 for selected conditions, precautions do apply. Overdosage: If you think you have taken too much of this medicine contact a poison control center or emergency room at once. NOTE: This medicine is only for you. Do not share this medicine with others. What if I miss a dose? If you miss a dose, take it as soon as you can. If  your next dose is to be taken in less than 12 hours, then do not take the missed dose. Take the next dose at your regular time. Do not take double or extra doses. What may interact with this medicine? Do not take this medicine with any of the following medications: -dasabuvir; ombitasvir; paritaprevir; ritonavir -ombitasvir; paritaprevir; ritonavir -posaconazole -red yeast rice This medicine may also interact with the following medications: -alcohol -birth control pills -certain antibiotics like erythromycin and clarithromycin -certain antivirals for HIV or hepatitis -certain medicines for cholesterol like fenofibrate, gemfibrozil, and niacin -certain medicines for fungal infections like ketoconazole and itraconazole -colchicine -cyclosporine -digoxin -grapefruit juice -rifampin This list may not describe all possible interactions. Give your health care provider a list of all the medicines, herbs, non-prescription drugs, or dietary supplements you use. Also tell them if you smoke, drink alcohol, or use illegal drugs. Some items may interact with your medicine. What should I watch for while using this medicine? Visit your doctor or health care professional for regular check-ups. You may need regular tests to make sure your liver is working properly. Your health care professional may tell you to stop taking this medicine if you develop muscle problems. If your muscle problems do not go away after stopping this medicine, contact your health care professional. Do not become pregnant while taking this medicine. Women should inform their health care professional if they wish to become pregnant or think they might be pregnant. There is a potential for serious side effects to an unborn child. Talk to your health care professional or pharmacist for more  information. Do not breast-feed an infant while taking this medicine. This medicine may affect blood sugar levels. If you have diabetes, check with  your doctor or health care professional before you change your diet or the dose of your diabetic medicine. If you are going to need surgery or other procedure, tell your doctor that you are using this medicine. This drug is only part of a total heart-health program. Your doctor or a dietician can suggest a low-cholesterol and low-fat diet to help. Avoid alcohol and smoking, and keep a proper exercise schedule. This medicine may cause a decrease in Co-Enzyme Q-10. You should make sure that you get enough Co-Enzyme Q-10 while you are taking this medicine. Discuss the foods you eat and the vitamins you take with your health care professional. What side effects may I notice from receiving this medicine? Side effects that you should report to your doctor or health care professional as soon as possible: -allergic reactions like skin rash, itching or hives, swelling of the face, lips, or tongue -fever -joint pain -loss of memory -redness, blistering, peeling or loosening of the skin, including inside the mouth -signs and symptoms of liver injury like dark yellow or brown urine; general ill feeling or flu-like symptoms; light-belly pain; unusually weak or tired; yellowing of the eyes or skin -signs and symptoms of muscle injury like dark urine; trouble passing urine or change in the amount of urine; unusually weak or tired; muscle pain or side or back pain Side effects that usually do not require medical attention (report to your doctor or health care professional if they continue or are bothersome): -diarrhea -nausea -stomach pain -trouble sleeping -upset stomach This list may not describe all possible side effects. Call your doctor for medical advice about side effects. You may report side effects to FDA at 1-800-FDA-1088. Where should I keep my medicine? Keep out of the reach of children. Store between 20 and 25 degrees C (68 and 77 degrees F). Throw away any unused medicine after the expiration  date. NOTE: This sheet is a summary. It may not cover all possible information. If you have questions about this medicine, talk to your doctor, pharmacist, or health care provider.  2019 Elsevier/Gold Standard (2018-04-11 11:36:48)  Saxagliptin oral tablets What is this medicine? SAXAGLIPTIN (SAX a glip tin) helps to treat type 2 diabetes. It helps to control blood sugar. Treatment is combined with diet and exercise. This medicine may be used for other purposes; ask your health care provider or pharmacist if you have questions. COMMON BRAND NAME(S): Onglyza What should I tell my health care provider before I take this medicine? They need to know if you have any of these conditions: -diabetic ketoacidosis -heart disease -heart failure -kidney disease -pancreatitis -previous swelling of the tongue, face, or lips with difficulty breathing, difficulty swallowing, hoarseness, or tightening of the throat -type 1 diabetes -an unusual or allergic reaction to saxagliptin, other medicines, foods, dyes, or preservatives -pregnant or trying to get pregnant -breast-feeding How should I use this medicine? Take this medicine by mouth with a glass of water. Follow the directions on the prescription label. You can take it with or without food. Do not cut, crush, or chew this medicine. Take your dose at the same time each day. Do not take more often than directed. Do not stop taking except on your doctor's advice. A special MedGuide will be given to you by the pharmacist with each prescription and refill. Be sure to read this information carefully  each time. Talk to your pediatrician regarding the use of this medicine in children. Special care may be needed. Overdosage: If you think you have taken too much of this medicine contact a poison control center or emergency room at once. NOTE: This medicine is only for you. Do not share this medicine with others. What if I miss a dose? If you miss a dose, take  it as soon as you can. If it is almost time for your next dose, take only that dose. Do not take double or extra doses. What may interact with this medicine? Do not take this medicine with any of the following medications: -gatifloxacin This medicine may also interact with the following medications: -alcohol -atazanavir -clarithromycin -indinavir -insulin -itraconazole -ketoconazole -nefazodone -nelfinavir -ritonavir -saquinavir -sulfonylureas like glimepiride, glipizide, glyburide -telithromycin This list may not describe all possible interactions. Give your health care provider a list of all the medicines, herbs, non-prescription drugs, or dietary supplements you use. Also tell them if you smoke, drink alcohol, or use illegal drugs. Some items may interact with your medicine. What should I watch for while using this medicine? Visit your doctor or health care professional for regular checks on your progress. A test called the HbA1C (A1C) will be monitored. This is a simple blood test. It measures your blood sugar control over the last 2 to 3 months. You will receive this test every 3 to 6 months. Learn how to check your blood sugar. Learn the symptoms of low and high blood sugar and how to manage them. Always carry a quick-source of sugar with you in case you have symptoms of low blood sugar. Examples include hard sugar candy or glucose tablets. Make sure others know that you can choke if you eat or drink when you develop serious symptoms of low blood sugar, such as seizures or unconsciousness. They must get medical help at once. Tell your doctor or health care professional if you have high blood sugar. You might need to change the dose of your medicine. If you are sick or exercising more than usual, you might need to change the dose of your medicine. Do not skip meals. Ask your doctor or health care professional if you should avoid alcohol. Many nonprescription cough and cold products  contain sugar or alcohol. These can affect blood sugar. Wear a medical ID bracelet or chain, and carry a card that describes your disease and details of your medicine and dosage times. What side effects may I notice from receiving this medicine? Side effects that you should report to your doctor or health care professional as soon as possible: -allergic reactions like skin rash, itching or hives, swelling of the face, lips, or tongue -breathing problems -general ill feeling or flu-like symptoms -joint pain -loss of appetite -redness, blistering, peeling or loosening of the skin, including inside the mouth -signs and symptoms of heart failure like breathing problems, fast, irregular heartbeat, sudden weight gain; swelling of the ankles, feet, hands; unusually weak or tired -signs and symptoms of low blood sugar such as feeling anxious, confusion, dizziness, increased hunger, unusually weak or tired, sweating, shakiness, cold, irritable, headache, blurred vision, fast heartbeat, loss of consciousness -signs and symptoms of muscle injury like dark urine; trouble passing urine or change in the amount of urine; unusually weak or tired; muscle pain; back pain -unusual stomach upset or pain -vomiting Side effects that usually do not require medical attention (report to your doctor or health care professional if they continue or are bothersome): -  headache -sore throat -stuffy or runny nose This list may not describe all possible side effects. Call your doctor for medical advice about side effects. You may report side effects to FDA at 1-800-FDA-1088. Where should I keep my medicine? Keep out of the reach of children. Store at room temperature between 15 and 30 degrees C (59 and 86 degrees F). Throw away any unused medicine after the expiration date. NOTE: This sheet is a summary. It may not cover all possible information. If you have questions about this medicine, talk to your doctor, pharmacist, or  health care provider.  2019 Elsevier/Gold Standard (2016-06-08 14:35:50)

## 2019-01-19 NOTE — Telephone Encounter (Signed)
Spoke with patient, advised that he should be taking the onglyza and the metformin for diabetes. Thanks!

## 2019-02-10 DIAGNOSIS — M65311 Trigger thumb, right thumb: Secondary | ICD-10-CM | POA: Diagnosis not present

## 2019-02-13 MED FILL — metFORMIN HCL 1000 MG TABS: 1000 | 30 days supply | Qty: 60 | Fill #3

## 2019-02-13 MED FILL — GABAPENTIN 300 MG CAPSULE: 300 | 30 days supply | Qty: 120 | Fill #2

## 2019-02-16 ENCOUNTER — Telehealth: Payer: Self-pay

## 2019-02-16 ENCOUNTER — Encounter: Payer: Self-pay | Admitting: Family Medicine

## 2019-02-16 ENCOUNTER — Ambulatory Visit (INDEPENDENT_AMBULATORY_CARE_PROVIDER_SITE_OTHER): Payer: Medicaid Other | Admitting: Family Medicine

## 2019-02-16 VITALS — BP 140/80 | HR 87 | Temp 97.7°F | Resp 14 | Ht 72.0 in | Wt 255.0 lb

## 2019-02-16 DIAGNOSIS — E1169 Type 2 diabetes mellitus with other specified complication: Secondary | ICD-10-CM

## 2019-02-16 LAB — POCT URINALYSIS DIPSTICK
Bilirubin, UA: NEGATIVE
Blood, UA: NEGATIVE
Glucose, UA: NEGATIVE
Ketones, UA: NEGATIVE
Nitrite, UA: NEGATIVE
Protein, UA: NEGATIVE
Spec Grav, UA: 1.02 (ref 1.010–1.025)
Urobilinogen, UA: 0.2 E.U./dL
pH, UA: 5.5 (ref 5.0–8.0)

## 2019-02-16 LAB — POCT GLYCOSYLATED HEMOGLOBIN (HGB A1C): Hemoglobin A1C: 8 % — AB (ref 4.0–5.6)

## 2019-02-16 MED ORDER — ATORVASTATIN CALCIUM 40 MG PO TABS: 40.0000 mg | ORAL_TABLET | Freq: Every day | ORAL | 3 refills | Status: DC

## 2019-02-16 MED ORDER — BLOOD GLUCOSE MONITOR KIT: PACK | 0 refills | Status: DC

## 2019-02-16 MED ORDER — SAXAGLIPTIN HCL 5 MG PO TABS: 5.0000 mg | ORAL_TABLET | Freq: Every day | ORAL | 3 refills | Status: DC

## 2019-02-16 MED FILL — ONGLYZA 5 MG TABLET: 5 | 30 days supply | Qty: 30 | Fill #0

## 2019-02-16 MED FILL — ATORVASTATIN 40 MG TABLET: 40 | 90 days supply | Qty: 90 | Fill #0

## 2019-02-16 NOTE — Progress Notes (Addendum)
[facility]   Progress Note: General Provider: Lanae Boast, FNP  SUBJECTIVE:   Kyle Merritt is a 58 y.o. male who  has a past medical history of Colon cancer ([place]), Diabetes mellitus without complication ([place]), High cholesterol, History of colon cancer, and Hypertension.. Patient presents today for Follow-up and Diabetes  Patient states that he started onglyza almost 4 weeks ago. He was unaware of the prescription. He states that he is following a carb modified diet. He states that he works a rigorous job where he walks consistently. Patient states that he was given a steroid injection last Monday. He reports this CBG have been around 120s. A1c needs to be 7.5 or less to have surgery.    Review of Systems  Constitutional: Negative.   HENT: Negative.   Eyes: Negative.   Respiratory: Negative.   Cardiovascular: Negative.   Gastrointestinal: Negative.   Genitourinary: Negative.   Musculoskeletal: Negative.   Skin: Negative.   Neurological: Negative.   Psychiatric/Behavioral: Negative.      OBJECTIVE: BP 140/80   Pulse 87   Temp 97.7 F (36.5 C) (Oral)   Resp 14   Ht 6' (1.829 m)   Wt 255 lb (115.7 kg)   SpO2 100%   BMI 34.58 kg/m   Wt Readings from Last 3 Encounters:  02/16/19 255 lb (115.7 kg)  01/19/19 255 lb (115.7 kg)  11/26/18 255 lb (115.7 kg)     Physical Exam Vitals signs and nursing note reviewed.  Constitutional:      General: He is not in acute distress.    Appearance: He is well-developed.  HENT:     Head: Normocephalic and atraumatic.  Eyes:     Conjunctiva/sclera: Conjunctivae normal.     Pupils: Pupils are equal, round, and reactive to light.  Neck:     Musculoskeletal: Normal range of motion.  Cardiovascular:     Rate and Rhythm: Normal rate and regular rhythm.     Heart sounds: Normal heart sounds.  Pulmonary:     Effort: Pulmonary effort is normal. No respiratory distress.     Breath  sounds: Normal breath sounds.  Abdominal:     General: Bowel sounds are normal. There is no distension.     Palpations: Abdomen is soft.  Musculoskeletal: Normal range of motion.  Skin:    General: Skin is warm and dry.  Neurological:     Mental Status: He is alert and oriented to person, place, and time.  Psychiatric:        Mood and Affect: Mood normal.        Behavior: Behavior normal.        Thought Content: Thought content normal.        Judgment: Judgment normal.     ASSESSMENT/PLAN:   1. Type 2 diabetes mellitus with other specified complication, without long-term current use of insulin (HCC) Patient states that he is eating 30 pieces of caramel candy per week. Instructed patient that this may cause his increase in weight and A1C. Advised patient to decrease candy intake and to start exercising and weight lifting. Discussed that steroids can increase blood sugars, but he also needs to decrease candy intake.   - HgB A1c - POCT urinalysis dipstick - atorvastatin (LIPITOR) 40 MG tablet; Take 1 tablet (40 mg total) by mouth daily.  Dispense: 90 tablet; Refill: 3 - saxagliptin HCl (ONGLYZA) 5 MG TABS tablet; Take 1 tablet (5 mg total) by mouth daily.  Dispense: 30 tablet; Refill: 5    Return in about 8 weeks (around 04/13/2019) for DM.    The patient was given clear instructions to go to ER or return to medical center if symptoms do not improve, worsen or new problems develop. The patient verbalized understanding and agreed with plan of care.   Ms. [name]. Nathaneil Canary, FNP-BC [facility] [address] [place], [place] [address] [phone]

## 2019-02-16 NOTE — Patient Instructions (Signed)
Diabetes Mellitus and Nutrition, Adult  When you have diabetes (diabetes mellitus), it is very important to have healthy eating habits because your blood sugar (glucose) levels are greatly affected by what you eat and drink. Eating healthy foods in the appropriate amounts, at about the same times every day, can help you:  · Control your blood glucose.  · Lower your risk of heart disease.  · Improve your blood pressure.  · Reach or maintain a healthy weight.  Every person with diabetes is different, and each person has different needs for a meal plan. Your health care provider may recommend that you work with a diet and nutrition specialist (dietitian) to make a meal plan that is best for you. Your meal plan may vary depending on factors such as:  · The calories you need.  · The medicines you take.  · Your weight.  · Your blood glucose, blood pressure, and cholesterol levels.  · Your activity level.  · Other health conditions you have, such as heart or kidney disease.  How do carbohydrates affect me?  Carbohydrates, also called carbs, affect your blood glucose level more than any other type of food. Eating carbs naturally raises the amount of glucose in your blood. Carb counting is a method for keeping track of how many carbs you eat. Counting carbs is important to keep your blood glucose at a healthy level, especially if you use insulin or take certain oral diabetes medicines.  It is important to know how many carbs you can safely have in each meal. This is different for every person. Your dietitian can help you calculate how many carbs you should have at each meal and for each snack.  Foods that contain carbs include:  · Bread, cereal, rice, pasta, and crackers.  · Potatoes and corn.  · Peas, beans, and lentils.  · Milk and yogurt.  · Fruit and juice.  · Desserts, such as cakes, cookies, ice cream, and candy.  How does alcohol affect me?  Alcohol can cause a sudden decrease in blood glucose (hypoglycemia),  especially if you use insulin or take certain oral diabetes medicines. Hypoglycemia can be a life-threatening condition. Symptoms of hypoglycemia (sleepiness, dizziness, and confusion) are similar to symptoms of having too much alcohol.  If your health care provider says that alcohol is safe for you, follow these guidelines:  · Limit alcohol intake to no more than 1 drink per day for nonpregnant women and 2 drinks per day for men. One drink equals 12 oz of beer, 5 oz of wine, or 1½ oz of hard liquor.  · Do not drink on an empty stomach.  · Keep yourself hydrated with water, diet soda, or unsweetened iced tea.  · Keep in mind that regular soda, juice, and other mixers may contain a lot of sugar and must be counted as carbs.  What are tips for following this plan?    Reading food labels  · Start by checking the serving size on the "Nutrition Facts" label of packaged foods and drinks. The amount of calories, carbs, fats, and other nutrients listed on the label is based on one serving of the item. Many items contain more than one serving per package.  · Check the total grams (g) of carbs in one serving. You can calculate the number of servings of carbs in one serving by dividing the total carbs by 15. For example, if a food has 30 g of total carbs, it would be equal to 2   servings of carbs.  · Check the number of grams (g) of saturated and trans fats in one serving. Choose foods that have low or no amount of these fats.  · Check the number of milligrams (mg) of salt (sodium) in one serving. Most people should limit total sodium intake to less than 2,300 mg per day.  · Always check the nutrition information of foods labeled as "low-fat" or "nonfat". These foods may be higher in added sugar or refined carbs and should be avoided.  · Talk to your dietitian to identify your daily goals for nutrients listed on the label.  Shopping  · Avoid buying canned, premade, or processed foods. These foods tend to be high in fat, sodium,  and added sugar.  · Shop around the outside edge of the grocery store. This includes fresh fruits and vegetables, bulk grains, fresh meats, and fresh dairy.  Cooking  · Use low-heat cooking methods, such as baking, instead of high-heat cooking methods like deep frying.  · Cook using healthy oils, such as olive, canola, or sunflower oil.  · Avoid cooking with butter, cream, or high-fat meats.  Meal planning  · Eat meals and snacks regularly, preferably at the same times every day. Avoid going long periods of time without eating.  · Eat foods high in fiber, such as fresh fruits, vegetables, beans, and whole grains. Talk to your dietitian about how many servings of carbs you can eat at each meal.  · Eat 4-6 ounces (oz) of lean protein each day, such as lean meat, chicken, fish, eggs, or tofu. One oz of lean protein is equal to:  ? 1 oz of meat, chicken, or fish.  ? 1 egg.  ? ¼ cup of tofu.  · Eat some foods each day that contain healthy fats, such as avocado, nuts, seeds, and fish.  Lifestyle  · Check your blood glucose regularly.  · Exercise regularly as told by your health care provider. This may include:  ? 150 minutes of moderate-intensity or vigorous-intensity exercise each week. This could be brisk walking, biking, or water aerobics.  ? Stretching and doing strength exercises, such as yoga or weightlifting, at least 2 times a week.  · Take medicines as told by your health care provider.  · Do not use any products that contain nicotine or tobacco, such as cigarettes and e-cigarettes. If you need help quitting, ask your health care provider.  · Work with a counselor or diabetes educator to identify strategies to manage stress and any emotional and social challenges.  Questions to ask a health care provider  · Do I need to meet with a diabetes educator?  · Do I need to meet with a dietitian?  · What number can I call if I have questions?  · When are the best times to check my blood glucose?  Where to find more  information:  · American Diabetes Association: diabetes.org  · Academy of Nutrition and Dietetics: www.eatright.org  · National Institute of Diabetes and Digestive and Kidney Diseases (NIH): www.niddk.nih.gov  Summary  · A healthy meal plan will help you control your blood glucose and maintain a healthy lifestyle.  · Working with a diet and nutrition specialist (dietitian) can help you make a meal plan that is best for you.  · Keep in mind that carbohydrates (carbs) and alcohol have immediate effects on your blood glucose levels. It is important to count carbs and to use alcohol carefully.  This information is not intended to   replace advice given to you by your health care provider. Make sure you discuss any questions you have with your health care provider.  Document Released: 09/06/2005 Document Revised: 07/10/2017 Document Reviewed: 01/14/2017  Elsevier Interactive Patient Education © 2019 Elsevier Inc.

## 2019-02-16 NOTE — Telephone Encounter (Signed)
This was manually faxed to pharmacy @ 10:58am. Thanks!

## 2019-02-17 MED FILL — ACCU-CHEK GUIDE TEST STRIP: 25 days supply | Qty: 100 | Fill #0

## 2019-02-17 MED FILL — ACCU-CHEK FASTCLIX LANCETS: 25 days supply | Qty: 102 | Fill #0

## 2019-02-17 MED FILL — ACCU-CHEK GUIDE W/DEVICE KI: W/DEVICE | 30 days supply | Qty: 1 | Fill #0

## 2019-03-03 ENCOUNTER — Other Ambulatory Visit: Payer: Self-pay | Admitting: Family Medicine

## 2019-03-03 DIAGNOSIS — I861 Scrotal varices: Secondary | ICD-10-CM

## 2019-03-03 MED FILL — LISINOPRIL-HCTZ 20-12.5 MG: 20-12.5 | 30 days supply | Qty: 30 | Fill #3

## 2019-03-05 MED FILL — IBUPROFEN 800 MG TABLET: 800 | 10 days supply | Qty: 30 | Fill #0

## 2019-03-09 ENCOUNTER — Telehealth: Payer: Self-pay

## 2019-03-09 DIAGNOSIS — I861 Scrotal varices: Secondary | ICD-10-CM

## 2019-03-10 MED ORDER — IBUPROFEN 800 MG PO TABS: 800.0000 mg | ORAL_TABLET | Freq: Three times a day (TID) | ORAL | 0 refills | Status: DC | PRN

## 2019-03-10 MED FILL — metFORMIN HCL 1000 MG TABS: 1000 | 30 days supply | Qty: 60 | Fill #4

## 2019-03-10 NOTE — Telephone Encounter (Signed)
sent 

## 2019-03-26 MED FILL — IBUPROFEN 800 MG TABLET: 800 | 10 days supply | Qty: 30 | Fill #0

## 2019-03-26 MED FILL — LISINOPRIL-HCTZ 20-12.5 MG: 20-12.5 | 30 days supply | Qty: 30 | Fill #4

## 2019-03-26 MED FILL — ONGLYZA 5 MG TABLET: 5 | 30 days supply | Qty: 30 | Fill #1

## 2019-04-08 ENCOUNTER — Other Ambulatory Visit: Payer: Self-pay | Admitting: Family Medicine

## 2019-04-08 MED FILL — ACCU-CHEK GUIDE TEST STRIP: 25 days supply | Qty: 100 | Fill #0

## 2019-04-08 MED FILL — metFORMIN HCL 1000 MG TABS: 1000 | 30 days supply | Qty: 60 | Fill #5

## 2019-04-09 ENCOUNTER — Telehealth: Payer: Self-pay

## 2019-04-13 ENCOUNTER — Ambulatory Visit: Payer: Medicaid Other | Admitting: Family Medicine

## 2019-04-22 NOTE — Telephone Encounter (Signed)
Note not needed 

## 2019-04-30 MED FILL — ONGLYZA 5 MG TABLET: 5 | 30 days supply | Qty: 30 | Fill #2

## 2019-04-30 MED FILL — LISINOPRIL-HCTZ 20-12.5 MG: 20-12.5 | 30 days supply | Qty: 30 | Fill #5

## 2019-05-08 ENCOUNTER — Ambulatory Visit: Payer: Medicaid Other | Admitting: Family Medicine

## 2019-05-19 DIAGNOSIS — M65311 Trigger thumb, right thumb: Secondary | ICD-10-CM | POA: Diagnosis not present

## 2019-05-19 MED FILL — metFORMIN HCL 1000 MG TABS: 1000 | 90 days supply | Qty: 180 | Fill #0

## 2019-05-22 ENCOUNTER — Telehealth: Payer: Self-pay

## 2019-05-22 DIAGNOSIS — M65311 Trigger thumb, right thumb: Secondary | ICD-10-CM | POA: Diagnosis not present

## 2019-05-22 HISTORY — PX: TRIGGER FINGER RELEASE: SHX641

## 2019-05-22 NOTE — Telephone Encounter (Signed)
Called and spoke with patient for COVID 19 Screening. Patient had no risk factors and is cleared to come into office for appointment. Thanks! 

## 2019-05-25 ENCOUNTER — Encounter: Payer: Self-pay | Admitting: Family Medicine

## 2019-05-25 ENCOUNTER — Ambulatory Visit (INDEPENDENT_AMBULATORY_CARE_PROVIDER_SITE_OTHER): Payer: Medicaid Other | Admitting: Family Medicine

## 2019-05-25 ENCOUNTER — Other Ambulatory Visit: Payer: Self-pay

## 2019-05-25 VITALS — BP 143/74 | HR 66 | Temp 98.0°F | Resp 16 | Ht 72.0 in | Wt 259.0 lb

## 2019-05-25 DIAGNOSIS — E1169 Type 2 diabetes mellitus with other specified complication: Secondary | ICD-10-CM | POA: Diagnosis not present

## 2019-05-25 DIAGNOSIS — N529 Male erectile dysfunction, unspecified: Secondary | ICD-10-CM

## 2019-05-25 DIAGNOSIS — I10 Essential (primary) hypertension: Secondary | ICD-10-CM | POA: Diagnosis not present

## 2019-05-25 LAB — POCT URINALYSIS DIPSTICK
Bilirubin, UA: NEGATIVE
Blood, UA: NEGATIVE
Glucose, UA: NEGATIVE
Ketones, UA: NEGATIVE
Nitrite, UA: NEGATIVE
Protein, UA: NEGATIVE
Spec Grav, UA: 1.02 (ref 1.010–1.025)
Urobilinogen, UA: 0.2 E.U./dL
pH, UA: 5.5 (ref 5.0–8.0)

## 2019-05-25 LAB — POCT GLYCOSYLATED HEMOGLOBIN (HGB A1C): Hemoglobin A1C: 8.4 % — AB (ref 4.0–5.6)

## 2019-05-25 MED ORDER — EMPAGLIFLOZIN 10 MG PO TABS: 10.0000 mg | ORAL_TABLET | Freq: Every day | ORAL | 5 refills | Status: DC

## 2019-05-25 MED ORDER — SILDENAFIL CITRATE 100 MG PO TABS: 100.0000 mg | ORAL_TABLET | Freq: Every day | ORAL | 2 refills | Status: DC | PRN

## 2019-05-25 MED FILL — JARDIANCE 10 MG TABLET: 10 | 30 days supply | Qty: 30 | Fill #0

## 2019-05-25 NOTE — Patient Instructions (Signed)
Diabetes Mellitus and Standards of Medical Care Managing diabetes (diabetes mellitus) can be complicated. Your diabetes treatment may be managed by a team of health care providers, including:  A physician who specializes in diabetes (endocrinologist).  A nurse practitioner or physician assistant.  Nurses.  A diet and nutrition specialist (registered dietitian).  A certified diabetes educator (CDE).  An exercise specialist.  A pharmacist.  An eye doctor.  A foot specialist (podiatrist).  A dentist.  A primary care provider.  A mental health provider. Your health care providers follow guidelines to help you get the best quality of care. The following schedule is a general guideline for your diabetes management plan. Your health care providers may give you more specific instructions. Physical exams Upon being diagnosed with diabetes mellitus, and each year after that, your health care provider will ask about your medical and family history. He or she will also do a physical exam. Your exam may include:  Measuring your height, weight, and body mass index (BMI).  Checking your blood pressure. This will be done at every routine medical visit. Your target blood pressure may vary depending on your medical conditions, your age, and other factors.  Thyroid gland exam.  Skin exam.  Screening for damage to your nerves (peripheral neuropathy). This may include checking the pulse in your legs and feet and checking the level of sensation in your hands and feet.  A complete foot exam to inspect the structure and skin of your feet, including checking for cuts, bruises, redness, blisters, sores, or other problems.  Screening for blood vessel (vascular) problems, which may include checking the pulse in your legs and feet and checking your temperature. Blood tests Depending on your treatment plan and your personal needs, you may have the following tests done:  HbA1c (hemoglobin A1c). This  test provides information about blood sugar (glucose) control over the previous 2-3 months. It is used to adjust your treatment plan, if needed. This test will be done: ? At least 2 times a year, if you are meeting your treatment goals. ? 4 times a year, if you are not meeting your treatment goals or if treatment goals have changed.  Lipid testing, including total, LDL, and HDL cholesterol and triglyceride levels. ? The goal for LDL is less than 100 mg/dL (5.5 mmol/L). If you are at high risk for complications, the goal is less than 70 mg/dL (3.9 mmol/L). ? The goal for HDL is 40 mg/dL (2.2 mmol/L) or higher for men and 50 mg/dL (2.8 mmol/L) or higher for women. An HDL cholesterol of 60 mg/dL (3.3 mmol/L) or higher gives some protection against heart disease. ? The goal for triglycerides is less than 150 mg/dL (8.3 mmol/L).  Liver function tests.  Kidney function tests.  Thyroid function tests. Dental and eye exams  Visit your dentist two times a year.  If you have type 1 diabetes, your health care provider may recommend an eye exam 3-5 years after you are diagnosed, and then once a year after your first exam. ? For children with type 1 diabetes, a health care provider may recommend an eye exam when your child is age 10 or older and has had diabetes for 3-5 years. After the first exam, your child should get an eye exam once a year.  If you have type 2 diabetes, your health care provider may recommend an eye exam as soon as you are diagnosed, and then once a year after your first exam. Immunizations   The  your first exam.  Immunizations    The yearly flu (influenza) vaccine is recommended for everyone 6 months or older who has diabetes.  The pneumonia (pneumococcal) vaccine is recommended for everyone 2 years or older who has diabetes. If you are 65 or older, you may get the pneumonia vaccine as a series of two separate shots.  The hepatitis B vaccine is recommended for adults shortly after being diagnosed with diabetes.  Adults and children with diabetes should receive all other vaccines  according to age-specific recommendations from the Centers for Disease Control and Prevention (CDC).  Mental and emotional health  Screening for symptoms of eating disorders, anxiety, and depression is recommended at the time of diagnosis and afterward as needed. If your screening shows that you have symptoms (positive screening result), you may need more evaluation and you may work with a mental health care provider.  Treatment plan  Your treatment plan will be reviewed at every medical visit. You and your health care provider will discuss:  How you are taking your medicines, including insulin.  Any side effects you are experiencing.  Your blood glucose target goals.  The frequency of your blood glucose monitoring.  Lifestyle habits, such as activity level as well as tobacco, alcohol, and substance use.  Diabetes self-management education  Your health care provider will assess how well you are monitoring your blood glucose levels and whether you are taking your insulin correctly. He or she may refer you to:  A certified diabetes educator to manage your diabetes throughout your life, starting at diagnosis.  A registered dietitian who can create or review your personal nutrition plan.  An exercise specialist who can discuss your activity level and exercise plan.  Summary  Managing diabetes (diabetes mellitus) can be complicated. Your diabetes treatment may be managed by a team of health care providers.  Your health care providers follow guidelines in order to help you get the best quality of care.  Standards of care including having regular physical exams, blood tests, blood pressure monitoring, immunizations, screening tests, and education about how to manage your diabetes.  Your health care providers may also give you more specific instructions based on your individual health.  This information is not intended to replace advice given to you by your health care provider. Make sure you discuss any questions you have  with your health care provider.  Document Released: 10/07/2009 Document Revised: 08/29/2018 Document Reviewed: 09/07/2016  Elsevier Interactive Patient Education  2019 Elsevier Inc.

## 2019-05-25 NOTE — Progress Notes (Signed)
Patient [facility]   Progress Note: General Provider: Lanae Boast, FNP  SUBJECTIVE:   Kyle Merritt is a 58 y.o. male who  has a past medical history of Colon cancer ([place]), Diabetes mellitus without complication ([place]), High cholesterol, History of colon cancer, and Hypertension.. Patient presents today for Hypertension and Diabetes Patient reports compliance with medications and denies side effects at the present time. He does not currently monitor his home BP readings. Checks blood glucose intermittently. He reports that he has gained weight since the last visit. He does not endorse a carb modified or heart healthy diet at the present time. He is living at [facility] currently.    Review of Systems  Constitutional: Negative.   HENT: Negative.   Eyes: Negative.   Respiratory: Negative.   Cardiovascular: Negative.   Gastrointestinal: Negative.   Genitourinary: Negative.   Musculoskeletal: Negative.   Skin: Negative.   Neurological: Negative.   Psychiatric/Behavioral: Negative.      OBJECTIVE: BP (!) 143/74 (BP Location: Left Arm, Patient Position: Sitting, Cuff Size: Large)   Pulse 66   Temp 98 F (36.7 C) (Oral)   Resp 16   Ht 6' (1.829 m)   Wt 259 lb (117.5 kg)   SpO2 100%   BMI 35.13 kg/m   Wt Readings from Last 3 Encounters:  05/25/19 259 lb (117.5 kg)  02/16/19 255 lb (115.7 kg)  01/19/19 255 lb (115.7 kg)     Physical Exam Vitals signs and nursing note reviewed.  Constitutional:      General: He is not in acute distress.    Appearance: Normal appearance.  HENT:     Head: Normocephalic and atraumatic.  Eyes:     Extraocular Movements: Extraocular movements intact.     Conjunctiva/sclera: Conjunctivae normal.     Pupils: Pupils are equal, round, and reactive to light.  Cardiovascular:     Rate and Rhythm: Normal rate and regular rhythm.     Heart sounds: No murmur.  Pulmonary:     Effort: Pulmonary effort is  normal.     Breath sounds: Normal breath sounds.  Musculoskeletal: Normal range of motion.  Skin:    General: Skin is warm and dry.  Neurological:     Mental Status: He is alert and oriented to person, place, and time.  Psychiatric:        Mood and Affect: Mood normal.        Behavior: Behavior normal.        Thought Content: Thought content normal.        Judgment: Judgment normal.     ASSESSMENT/PLAN:  1. Hypertension, unspecified type - Urinalysis Dipstick - Comprehensive metabolic panel  2. Type 2 diabetes mellitus with other specified complication, without long-term current use of insulin (HCC) - Urinalysis Dipstick - HgB A1c - empagliflozin (JARDIANCE) 10 MG TABS tablet; Take 10 mg by mouth daily.  Dispense: 30 tablet; Refill: 5 - Comprehensive metabolic panel  3. Erectile dysfunction, unspecified erectile dysfunction type - sildenafil (VIAGRA) 100 MG tablet; Take 1 tablet (100 mg total) by mouth daily as needed for erectile dysfunction.  Dispense: 10 tablet; Refill: 2     Return in about 3 months (around 08/25/2019) for DM needs A1C.    The patient was given clear instructions to go to ER or return to medical center if symptoms do not improve, worsen or new problems develop. The patient verbalized understanding and agreed with plan of care.  Ms. [name]. Nathaneil Canary, FNP-BC [facility] [address] [place], [place] [address] [phone]

## 2019-05-26 LAB — COMPREHENSIVE METABOLIC PANEL
ALT: 16 IU/L (ref 0–44)
AST: 13 IU/L (ref 0–40)
Albumin/Globulin Ratio: 1.4 (ref 1.2–2.2)
Albumin: 4.2 g/dL (ref 3.8–4.9)
Alkaline Phosphatase: 91 IU/L (ref 39–117)
BUN/Creatinine Ratio: 17 (ref 10–24)
BUN: 25 mg/dL (ref 8–27)
Bilirubin Total: 0.8 mg/dL (ref 0.0–1.2)
CO2: 22 mmol/L (ref 20–29)
Calcium: 9.9 mg/dL (ref 8.6–10.2)
Chloride: 103 mmol/L (ref 96–106)
Creatinine, Ser: 1.43 mg/dL — ABNORMAL HIGH (ref 0.76–1.27)
GFR calc Af Amer: 61 mL/min/{1.73_m2} (ref >59)
GFR calc non Af Amer: 53 mL/min/{1.73_m2} — ABNORMAL LOW (ref >59)
Globulin, Total: 2.9 g/dL (ref 1.5–4.5)
Glucose: 152 mg/dL — ABNORMAL HIGH (ref 65–99)
Potassium: 4.2 mmol/L (ref 3.5–5.2)
Sodium: 140 mmol/L (ref 134–144)
Total Protein: 7.1 g/dL (ref 6.0–8.5)

## 2019-05-28 NOTE — Progress Notes (Signed)
Your labs are stable. Continue with your current medications. Please remember to keep your follow up appointment. If you have problems, questions or concerns, please make an appointment to discuss. Thanks!

## 2019-05-29 ENCOUNTER — Telehealth: Payer: Self-pay

## 2019-05-29 NOTE — Telephone Encounter (Signed)
Called, no answer and no voicemail. Will try later.

## 2019-05-29 NOTE — Telephone Encounter (Signed)
-----   Message from Lanae Boast, [place] sent at 05/28/2019  4:17 PM EDT ----- Your labs are stable. Continue with your current medications. Please remember to keep your follow up appointment. If you have problems, questions or concerns, please make an appointment to discuss. Thanks!

## 2019-06-01 NOTE — Telephone Encounter (Signed)
Patient called and I advised that labs are stable and to continue with current medications. Thanks!

## 2019-06-03 DIAGNOSIS — M4722 Other spondylosis with radiculopathy, cervical region: Secondary | ICD-10-CM | POA: Diagnosis not present

## 2019-06-03 DIAGNOSIS — M25512 Pain in left shoulder: Secondary | ICD-10-CM | POA: Diagnosis not present

## 2019-06-03 MED FILL — LISINOPRIL-HCTZ 20-12.5 MG: 20-12.5 | 30 days supply | Qty: 30 | Fill #6

## 2019-06-03 MED FILL — NAPROXEN 500 MG TABLET: 500 | 15 days supply | Qty: 30 | Fill #0

## 2019-06-04 ENCOUNTER — Telehealth: Payer: Self-pay

## 2019-06-04 NOTE — Telephone Encounter (Signed)
Error

## 2019-06-05 ENCOUNTER — Telehealth: Payer: Self-pay

## 2019-06-05 NOTE — Telephone Encounter (Signed)
Called, no answer. No voicemail.  

## 2019-06-16 DIAGNOSIS — M25512 Pain in left shoulder: Secondary | ICD-10-CM | POA: Diagnosis not present

## 2019-06-18 MED FILL — SIMVASTATIN 40 MG TABLET: 40 | 90 days supply | Qty: 90 | Fill #2

## 2019-06-18 MED FILL — JARDIANCE 10 MG TABLET: 10 | 30 days supply | Qty: 30 | Fill #1

## 2019-07-02 MED FILL — ATORVASTATIN CALCIUM 40 MG: 40 | 90 days supply | Qty: 90 | Fill #1

## 2019-07-02 MED FILL — LISINOPRIL-HCTZ 20-12.5 MG: 20-12.5 | 30 days supply | Qty: 30 | Fill #7

## 2019-07-03 ENCOUNTER — Telehealth: Payer: Self-pay

## 2019-07-03 DIAGNOSIS — E1169 Type 2 diabetes mellitus with other specified complication: Secondary | ICD-10-CM

## 2019-07-03 NOTE — Telephone Encounter (Signed)
Patient is asking for a referral for toe nail trim and foot care for diabetes. Can you put a referral in for this for him? Please advise. Thanks!

## 2019-07-08 MED FILL — NAPROXEN 500 MG TABLET: 500 | 15 days supply | Qty: 30 | Fill #0

## 2019-07-23 MED FILL — JARDIANCE 10 MG TABLET: 10 | 30 days supply | Qty: 30 | Fill #2

## 2019-07-29 DIAGNOSIS — M542 Cervicalgia: Secondary | ICD-10-CM | POA: Diagnosis not present

## 2019-07-30 ENCOUNTER — Encounter: Payer: Self-pay | Admitting: Family Medicine

## 2019-07-30 ENCOUNTER — Other Ambulatory Visit: Payer: Self-pay

## 2019-07-30 ENCOUNTER — Ambulatory Visit (INDEPENDENT_AMBULATORY_CARE_PROVIDER_SITE_OTHER): Payer: Medicaid Other | Admitting: Family Medicine

## 2019-07-30 VITALS — BP 129/79 | HR 66 | Temp 97.8°F | Resp 14 | Ht 69.0 in | Wt 250.0 lb

## 2019-07-30 DIAGNOSIS — I10 Essential (primary) hypertension: Secondary | ICD-10-CM

## 2019-07-30 DIAGNOSIS — E1169 Type 2 diabetes mellitus with other specified complication: Secondary | ICD-10-CM | POA: Diagnosis not present

## 2019-07-30 LAB — POCT GLYCOSYLATED HEMOGLOBIN (HGB A1C): Hemoglobin A1C: 8.3 % — AB (ref 4.0–5.6)

## 2019-07-30 LAB — POCT URINALYSIS DIPSTICK

## 2019-07-30 NOTE — Progress Notes (Signed)
Patient seen as a nurse visit for A1C.

## 2019-08-04 ENCOUNTER — Encounter: Payer: Self-pay | Admitting: Podiatry

## 2019-08-04 ENCOUNTER — Other Ambulatory Visit: Payer: Self-pay

## 2019-08-04 ENCOUNTER — Ambulatory Visit (INDEPENDENT_AMBULATORY_CARE_PROVIDER_SITE_OTHER): Payer: Medicaid Other | Admitting: Podiatry

## 2019-08-04 VITALS — BP 132/88 | HR 90 | Temp 97.3°F

## 2019-08-04 DIAGNOSIS — E119 Type 2 diabetes mellitus without complications: Secondary | ICD-10-CM

## 2019-08-04 DIAGNOSIS — B353 Tinea pedis: Secondary | ICD-10-CM | POA: Diagnosis not present

## 2019-08-04 DIAGNOSIS — M79675 Pain in left toe(s): Secondary | ICD-10-CM

## 2019-08-04 DIAGNOSIS — M79674 Pain in right toe(s): Secondary | ICD-10-CM

## 2019-08-04 DIAGNOSIS — B351 Tinea unguium: Secondary | ICD-10-CM

## 2019-08-04 MED ORDER — CLOTRIMAZOLE-BETAMETHASONE 1-0.05 % EX CREA: TOPICAL_CREAM | CUTANEOUS | 0 refills | Status: DC

## 2019-08-04 MED FILL — CLOTRIMAZOLE-BETAMETHASONE: 1-0.05 | 15 days supply | Qty: 45 | Fill #0

## 2019-08-04 NOTE — Patient Instructions (Addendum)
Athlete's Foot  Athlete's foot (tinea pedis) is a fungal infection of the skin on your feet. It often occurs on the skin that is between or underneath the toes. It can also occur on the soles of your feet. The infection can spread from person to person (is contagious). It can also spread when a person's bare feet come in contact with the fungus on shower floors or on items such as shoes. What are the causes? This condition is caused by a fungus that grows in warm, moist places. You can get athlete's foot by sharing shoes, shower stalls, towels, and wet floors with someone who is infected. Not washing your feet or changing your socks often enough can also lead to athlete's foot. What increases the risk? This condition is more likely to develop in:  Men.  People who have a weak body defense system (immune system).  People who have diabetes.  People who use public showers, such as at a gym.  People who wear heavy-duty shoes, such as industrial or military shoes.  Seasons with warm, humid weather. What are the signs or symptoms? Symptoms of this condition include:  Itchy areas between your toes or on the soles of your feet.  White, flaky, or scaly areas between your toes or on the soles of your feet.  Very itchy small blisters between your toes or on the soles of your feet.  Small cuts in your skin. These cuts can become infected.  Thick or discolored toenails. How is this diagnosed? This condition may be diagnosed with a physical exam and a review of your medical history. Your health care provider may also take a skin or toenail sample to examine under a microscope. How is this treated? This condition is treated with antifungal medicines. These may be applied as powders, ointments, or creams. In severe cases, an oral antifungal medicine may be given. Follow these instructions at home: Medicines  Apply or take over-the-counter and prescription medicines only as told by your health  care provider.  Apply your antifungal medicine as told by your health care provider. Do not stop using the antifungal even if your condition improves. Foot care  Do not scratch your feet.  Keep your feet dry: ? Wear cotton or wool socks. Change your socks every day or if they become wet. ? Wear shoes that allow air to flow, such as sandals or canvas tennis shoes.  Wash and dry your feet, including the area between your toes. Also, wash and dry your feet: ? Every day or as told by your health care provider. ? After exercising. General instructions  Do not let others use towels, shoes, nail clippers, or other personal items that touch your feet.  Protect your feet by wearing sandals in wet areas, such as locker rooms and shared showers.  Keep all follow-up visits as told by your health care provider. This is important.  If you have diabetes, keep your blood sugar under control. Contact a health care provider if:  You have a fever.  You have swelling, soreness, warmth, or redness in your foot.  Your feet are not getting better with treatment.  Your symptoms get worse.  You have new symptoms. Summary  Athlete's foot (tinea pedis) is a fungal infection of the skin on your feet. It often occurs on skin that is between or underneath the toes.  This condition is caused by a fungus that grows in warm, moist places.  Symptoms include white, flaky, or scaly areas between   your toes or on the soles of your feet.  This condition is treated with antifungal medicines.  Keep your feet clean. Always dry them thoroughly. This information is not intended to replace advice given to you by your health care provider. Make sure you discuss any questions you have with your health care provider. Document Released: 12/07/2000 Document Revised: 12/05/2017 Document Reviewed: 09/30/2017 Elsevier Patient Education  2020 Elsevier Inc.   Diabetes Mellitus and Foot Care Foot care is an important  part of your health, especially when you have diabetes. Diabetes may cause you to have problems because of poor blood flow (circulation) to your feet and legs, which can cause your skin to:  Become thinner and drier.  Break more easily.  Heal more slowly.  Peel and crack. You may also have nerve damage (neuropathy) in your legs and feet, causing decreased feeling in them. This means that you may not notice minor injuries to your feet that could lead to more serious problems. Noticing and addressing any potential problems early is the best way to prevent future foot problems. How to care for your feet Foot hygiene  Wash your feet daily with warm water and mild soap. Do not use hot water. Then, pat your feet and the areas between your toes until they are completely dry. Do not soak your feet as this can dry your skin.  Trim your toenails straight across. Do not dig under them or around the cuticle. File the edges of your nails with an emery board or nail file.  Apply a moisturizing lotion or petroleum jelly to the skin on your feet and to dry, brittle toenails. Use lotion that does not contain alcohol and is unscented. Do not apply lotion between your toes. Shoes and socks  Wear clean socks or stockings every day. Make sure they are not too tight. Do not wear knee-high stockings since they may decrease blood flow to your legs.  Wear shoes that fit properly and have enough cushioning. Always look in your shoes before you put them on to be sure there are no objects inside.  To break in new shoes, wear them for just a few hours a day. This prevents injuries on your feet. Wounds, scrapes, corns, and calluses  Check your feet daily for blisters, cuts, bruises, sores, and redness. If you cannot see the bottom of your feet, use a mirror or ask someone for help.  Do not cut corns or calluses or try to remove them with medicine.  If you find a minor scrape, cut, or break in the skin on your feet,  keep it and the skin around it clean and dry. You may clean these areas with mild soap and water. Do not clean the area with peroxide, alcohol, or iodine.  If you have a wound, scrape, corn, or callus on your foot, look at it several times a day to make sure it is healing and not infected. Check for: ? Redness, swelling, or pain. ? Fluid or blood. ? Warmth. ? Pus or a bad smell. General instructions  Do not cross your legs. This may decrease blood flow to your feet.  Do not use heating pads or hot water bottles on your feet. They may burn your skin. If you have lost feeling in your feet or legs, you may not know this is happening until it is too late.  Protect your feet from hot and cold by wearing shoes, such as at the beach or on hot pavement.    Schedule a complete foot exam at least once a year (annually) or more often if you have foot problems. If you have foot problems, report any cuts, sores, or bruises to your health care provider immediately. Contact a health care provider if:  You have a medical condition that increases your risk of infection and you have any cuts, sores, or bruises on your feet.  You have an injury that is not healing.  You have redness on your legs or feet.  You feel burning or tingling in your legs or feet.  You have pain or cramps in your legs and feet.  Your legs or feet are numb.  Your feet always feel cold.  You have pain around a toenail. Get help right away if:  You have a wound, scrape, corn, or callus on your foot and: ? You have pain, swelling, or redness that gets worse. ? You have fluid or blood coming from the wound, scrape, corn, or callus. ? Your wound, scrape, corn, or callus feels warm to the touch. ? You have pus or a bad smell coming from the wound, scrape, corn, or callus. ? You have a fever. ? You have a red line going up your leg. Summary  Check your feet every day for cuts, sores, red spots, swelling, and blisters.   Moisturize feet and legs daily.  Wear shoes that fit properly and have enough cushioning.  If you have foot problems, report any cuts, sores, or bruises to your health care provider immediately.  Schedule a complete foot exam at least once a year (annually) or more often if you have foot problems. This information is not intended to replace advice given to you by your health care provider. Make sure you discuss any questions you have with your health care provider. Document Released: 12/07/2000 Document Revised: 01/22/2018 Document Reviewed: 01/11/2017 Elsevier Patient Education  2020 Elsevier Inc.   Onychomycosis/Fungal Toenails  WHAT IS IT? An infection that lies within the keratin of your nail plate that is caused by a fungus.  WHY ME? Fungal infections affect all ages, sexes, races, and creeds.  There may be many factors that predispose you to a fungal infection such as age, coexisting medical conditions such as diabetes, or an autoimmune disease; stress, medications, fatigue, genetics, etc.  Bottom line: fungus thrives in a warm, moist environment and your shoes offer such a location.  IS IT CONTAGIOUS? Theoretically, yes.  You do not want to share shoes, nail clippers or files with someone who has fungal toenails.  Walking around barefoot in the same room or sleeping in the same bed is unlikely to transfer the organism.  It is important to realize, however, that fungus can spread easily from one nail to the next on the same foot.  HOW DO WE TREAT THIS?  There are several ways to treat this condition.  Treatment may depend on many factors such as age, medications, pregnancy, liver and kidney conditions, etc.  It is best to ask your doctor which options are available to you.  1. No treatment.   Unlike many other medical concerns, you can live with this condition.  However for many people this can be a painful condition and may lead to ingrown toenails or a bacterial infection.  It is  recommended that you keep the nails cut short to help reduce the amount of fungal nail. 2. Topical treatment.  These range from herbal remedies to prescription strength nail lacquers.  About 40-50% effective, topicals require   daily application for approximately 9 to 12 months or until an entirely new nail has grown out.  The most effective topicals are medical grade medications available through physicians offices. 3. Oral antifungal medications.  With an 80-90% cure rate, the most common oral medication requires 3 to 4 months of therapy and stays in your system for a year as the new nail grows out.  Oral antifungal medications do require blood work to make sure it is a safe drug for you.  A liver function panel will be performed prior to starting the medication and after the first month of treatment.  It is important to have the blood work performed to avoid any harmful side effects.  In general, this medication safe but blood work is required. 4. Laser Therapy.  This treatment is performed by applying a specialized laser to the affected nail plate.  This therapy is noninvasive, fast, and non-painful.  It is not covered by insurance and is therefore, out of pocket.  The results have been very good with a 80-95% cure rate.  The [facility] is the only practice in the area to offer this therapy. 5. Permanent Nail Avulsion.  Removing the entire nail so that a new nail will not grow back. 

## 2019-08-05 DIAGNOSIS — M67912 Unspecified disorder of synovium and tendon, left shoulder: Secondary | ICD-10-CM | POA: Diagnosis not present

## 2019-08-05 DIAGNOSIS — M4722 Other spondylosis with radiculopathy, cervical region: Secondary | ICD-10-CM | POA: Diagnosis not present

## 2019-08-07 MED FILL — LISINOPRIL-HCTZ 20-12.5 MG: 20-12.5 | 30 days supply | Qty: 30 | Fill #8

## 2019-08-11 ENCOUNTER — Other Ambulatory Visit: Payer: Self-pay

## 2019-08-11 DIAGNOSIS — E1169 Type 2 diabetes mellitus with other specified complication: Secondary | ICD-10-CM

## 2019-08-11 MED ORDER — METFORMIN HCL 1000 MG PO TABS: 1000.0000 mg | ORAL_TABLET | Freq: Two times a day (BID) | ORAL | 5 refills | Status: DC

## 2019-08-12 MED FILL — metFORMIN HCL 1000 MG TABS: 1000 | 30 days supply | Qty: 60 | Fill #0

## 2019-08-12 NOTE — Progress Notes (Signed)
Subjective: Kyle Merritt presents today referred by Lanae Boast, FNP for diabetic foot evaluation.  Patient relates 5 year history of diabetes.  Patient denies any history of foot wounds.  Patient denies any history of numbness, tingling, burning, pins/needles sensations.  Today, patient c/o of painful, discolored, thick toenails which interfere with daily activities. Duration greater than several weeks.  Pain is aggravated when wearing enclosed shoe gear. He has attempted no treatment.  Past Medical History:  Diagnosis Date  . Colon cancer ([place])   . Diabetes mellitus without complication ([place])   . High cholesterol   . History of colon cancer   . Hypertension     Patient Active Problem List   Diagnosis Date Noted  . Constipation 10/06/2018  . Olecranon bursitis of left elbow 06/04/2018  . Trigger finger, acquired 06/04/2018  . Cervical disc disorder with radiculopathy of cervical region 05/08/2018  . Other chronic pain 02/04/2018  . History of cocaine use 02/04/2018  . Hyperlipidemia 02/04/2018  . Colon cancer screening 11/29/2014  . Knee pain, acute 10/04/2014  . Essential hypertension 08/02/2014  . Dental abscess 08/02/2014  . Diabetes ([place]) 04/29/2014  . HTN (hypertension) 04/29/2014  . Dyslipidemia 04/29/2014  . History of colon cancer 04/29/2014  . Back pain 04/29/2014  . Erectile dysfunction 04/29/2014  . Tobacco abuse 07/27/2013  . PURE HYPERCHOLESTEROLEMIA 12/31/2008  . TESTOSTERONE DEFICIENCY 12/10/2008  . GERD 12/08/2008  . ONYCHOMYCOSIS, BILATERAL 11/09/2008  . DIABETES MELLITUS 11/09/2008  . Anemia 11/09/2008  . Malignant neoplasm of colon ([place]) 12/24/2002    Past Surgical History:  Procedure Laterality Date  . COLON SURGERY    . KNEE ARTHROSCOPY Bilateral   . TRIGGER FINGER RELEASE Right 05/22/2019   Anticonvulsants - Misc.   gabapentin (NEURONTIN) 300 MG capsule 300 mg, 4 times daily                Antihypertensive Combinations   lisinopril-hydrochlorothiazide (PRINZIDE,ZESTORETIC) 20-12.5 MG tablet 1 tablet, Daily      Biguanides   metFORMIN (GLUCOPHAGE) 1000 MG tablet 1,000 mg, 2 times daily with meals      Diabetic Supplies   blood glucose meter kit and supplies KIT       Diagnostic Tests   ACCU-CHEK GUIDE test strip       HMG CoA Reductase Inhibitors   atorvastatin (LIPITOR) 40 MG tablet 40 mg, Daily      Impotence Agents   sildenafil (VIAGRA) 100 MG tablet 100 mg, Daily PRN      Nonsteroidal Anti-inflammatory Agents (NSAIDs)   ibuprofen (ADVIL,MOTRIN) 800 MG tablet 800 mg, Every 8 hours PRN      Sodium   0.9 % sodium chloride infusion 500 mL, Once      Sodium-Glucose Co-Transporter 2 (SGLT2) Inhibitors   empagliflozin (JARDIANCE) 10 MG TABS tablet 10 mg, Daily        No Known Allergies  Social History   Occupational History  . Not on file  Tobacco Use  . Smoking status: Former Smoker    Quit date: 2018    Years since quitting: 2.6  . Smokeless tobacco: Never Used  Substance and Sexual Activity  . Alcohol use: No  . Drug use: No  . Sexual activity: Yes    Family History  Problem Relation Age of Onset  . Diabetes Mother   . Hypertension Mother   . Colon cancer Paternal Uncle   . Esophageal cancer Neg Hx   . Inflammatory bowel disease Neg Hx   . Liver disease  Neg Hx   . Pancreatic cancer Neg Hx   . Stomach cancer Neg Hx     Immunization History  Administered Date(s) Administered  . Influenza Whole 12/08/2008, 02/03/2010  . Influenza,inj,Quad PF,6+ Mos 10/17/2018  . Pneumococcal Polysaccharide-23 03/24/2009, 01/29/2018  . Td 12/08/2008  . Tdap 01/29/2018    Review of systems: Positive Findings in bold print.  Constitutional:  chills, fatigue, fever, sweats, weight change Communication: Optometrist, sign Ecologist, hand writing, iPad/Android device Head: headaches, head injury Eyes: changes in vision, eye pain, glaucoma, cataracts, macular degeneration, diplopia, glare,   light sensitivity, eyeglasses or contacts, blindness Ears nose mouth throat: hearing impaired, hearing aids,  ringing in ears, deaf, sign language,  vertigo, nosebleeds,  rhinitis,  cold sores, snoring, swollen glands Cardiovascular: HTN, edema, arrhythmia, pacemaker in place, defibrillator in place, chest pain/tightness, chronic anticoagulation, blood clot, heart failure, MI Peripheral Vascular: leg cramps, varicose veins, blood clots, lymphedema, varicosities Respiratory:  difficulty breathing, denies congestion, SOB, wheezing, cough, emphysema Gastrointestinal: change in appetite or weight, abdominal pain, constipation, diarrhea, nausea, vomiting, vomiting blood, change in bowel habits, abdominal pain, jaundice, rectal bleeding, hemorrhoids, GERD Genitourinary:  nocturia,  pain on urination, polyuria,  blood in urine, Foley catheter, urinary urgency, ESRD on hemodialysis Musculoskeletal: amputation, cramping, stiff joints, painful joints, decreased joint motion, fractures, OA, gout, hemiplegia, paraplegia, uses cane, wheelchair bound, uses walker, uses rollator, back pain Skin: +changes in toenails, color change, dryness, itching, mole changes,  rash, wound(s) Neurological: headaches, numbness in feet, paresthesias in feet, burning in feet, fainting,  seizures, change in speech,  headaches, memory problems/poor historian, cerebral palsy, weakness, paralysis, CVA, TIA Endocrine: diabetes, hypothyroidism, hyperthyroidism,  goiter, dry mouth, flushing, heat intolerance,  cold intolerance,  excessive thirst, denies polyuria,  nocturia Hematological:  easy bleeding, excessive bleeding, easy bruising, enlarged lymph nodes, on long term blood thinner, history of past transusions Allergy/immunological:  hives, eczema, frequent infections, multiple drug allergies, seasonal allergies, transplant recipient, multiple food allergies Psychiatric:  anxiety, depression, mood disorder, suicidal ideations,  hallucinations, insomnia  Objective: Vitals:   08/04/19 1437  BP: 132/88  Pulse: 90  Temp: (!) 97.3 F (36.3 C)   Vascular Examination: Capillary refill time <3 seconds x 10 digits.  Dorsalis pedis pulses palpable b/l.  Posterior tibial pulses palpable b/l.  Digital hair sparse x 10 digits.  Skin temperature gradient WNL b/l.  Dermatological Examination: Skin with normal turgor, texture and tone b/l.  Toenails 1-5 b/l discolored, thick, dystrophic with subungual debris and pain with palpation to nailbeds due to thickness of nails.  Diffuse scaling noted peripherally and plantarly b/l feet with mild foot odor.  No interdigital macerations.  No blisters, no weeping. No signs of secondary bacterial infection noted.  Musculoskeletal: Muscle strength 5/5 to all LE muscle groups.  Neurological: Sensation intact 5/5 b/l with 10 gram monofilament.  Vibratory sensation intact b/l.  Assessment: 1. Painful onychomycosis toenails 1-5 b/l  2. Tinea pedis b/l 3. NIDDM  Plan: 1. Discussed diabetic foot care principles. Literature dispensed on today. 2. Toenails 1-5 b/l were debrided in length and girth without iatrogenic bleeding. Rx was sent for Lotrisone Cream to be applied to both feet and between toes bid for 4 weeks. 3. Patient to continue soft, supportive shoe gear 4. Patient to report any pedal injuries to medical professional immediately. 5. Follow up 3 months.  6. Patient/POA to call should there be a concern in the interim.

## 2019-08-14 MED FILL — NAPROXEN 500 MG TABLET: 500 | 15 days supply | Qty: 30 | Fill #0

## 2019-08-18 DIAGNOSIS — M542 Cervicalgia: Secondary | ICD-10-CM | POA: Diagnosis not present

## 2019-08-18 DIAGNOSIS — M5412 Radiculopathy, cervical region: Secondary | ICD-10-CM | POA: Diagnosis not present

## 2019-08-18 MED FILL — MELOXICAM 15 MG TABLET: 15 | 30 days supply | Qty: 30 | Fill #0

## 2019-08-19 MED FILL — JARDIANCE 10 MG TABLET: 10 | 30 days supply | Qty: 30 | Fill #3

## 2019-08-24 DIAGNOSIS — M5412 Radiculopathy, cervical region: Secondary | ICD-10-CM | POA: Diagnosis not present

## 2019-08-26 ENCOUNTER — Ambulatory Visit (INDEPENDENT_AMBULATORY_CARE_PROVIDER_SITE_OTHER): Payer: Medicaid Other | Admitting: Family Medicine

## 2019-08-26 ENCOUNTER — Other Ambulatory Visit: Payer: Self-pay

## 2019-08-26 ENCOUNTER — Encounter: Payer: Self-pay | Admitting: Family Medicine

## 2019-08-26 VITALS — BP 125/69 | HR 86 | Temp 98.0°F | Resp 16 | Ht 69.0 in | Wt 247.0 lb

## 2019-08-26 DIAGNOSIS — E1169 Type 2 diabetes mellitus with other specified complication: Secondary | ICD-10-CM | POA: Diagnosis not present

## 2019-08-26 DIAGNOSIS — Z23 Encounter for immunization: Secondary | ICD-10-CM | POA: Diagnosis not present

## 2019-08-26 DIAGNOSIS — I10 Essential (primary) hypertension: Secondary | ICD-10-CM | POA: Diagnosis not present

## 2019-08-26 LAB — POCT URINALYSIS DIPSTICK
Bilirubin, UA: NEGATIVE
Blood, UA: NEGATIVE
Glucose, UA: POSITIVE — AB
Ketones, UA: NEGATIVE
Nitrite, UA: NEGATIVE
Protein, UA: NEGATIVE
Spec Grav, UA: 1.02 (ref 1.010–1.025)
Urobilinogen, UA: 0.2 E.U./dL
pH, UA: 5 (ref 5.0–8.0)

## 2019-08-26 LAB — POCT GLYCOSYLATED HEMOGLOBIN (HGB A1C): Hemoglobin A1C: 7.8 % — AB (ref 4.0–5.6)

## 2019-08-26 MED ORDER — TRULICITY 0.75 MG/0.5ML ~~LOC~~ SOAJ: 0.7500 mg | SUBCUTANEOUS | 5 refills | Status: DC

## 2019-08-26 NOTE — Progress Notes (Signed)
Patient Kyle Merritt   Progress Note: General Provider: Lanae Boast, FNP  SUBJECTIVE:   Kyle Merritt is a 58 y.o. male who  has a past medical history of Colon cancer ([place]), Diabetes mellitus without complication ([place]), High cholesterol, History of colon cancer, and Hypertension.. Patient presents today for Hypertension and Diabetes  Patient reports his home FBS are in the 130s. He did not bring his meter with him today. He states that he would like a referral to a nutritionist. Patient states that he has been receiving steroid injections in the shoulder, hand and knees over the past several months. He states that he is walking everyday for exercise. He has lost 12 pounds since his appointment in June. He states that he is working on his diet and has increased his water intake.  Review of Systems  Constitutional: Negative.   HENT: Negative.   Eyes: Negative.   Respiratory: Negative.   Cardiovascular: Negative.   Gastrointestinal: Negative.   Genitourinary: Negative.   Musculoskeletal: Positive for joint pain (right knee).  Skin: Negative.   Neurological: Negative.   Psychiatric/Behavioral: Negative.      OBJECTIVE: BP 125/69 (BP Location: Left Arm, Patient Position: Sitting, Cuff Size: Normal)   Pulse 86   Temp 98 F (36.7 C) (Oral)   Resp 16   Ht 5\' 9"  (1.753 m)   Wt 247 lb (112 kg)   SpO2 100%   BMI 36.48 kg/m   Wt Readings from Last 3 Encounters:  08/26/19 247 lb (112 kg)  07/30/19 250 lb (113.4 kg)  05/25/19 259 lb (117.5 kg)     Physical Exam Vitals signs and nursing note reviewed.  Constitutional:      General: He is not in acute distress.    Appearance: Normal appearance.  HENT:     Head: Normocephalic and atraumatic.  Eyes:     Extraocular Movements: Extraocular movements intact.     Conjunctiva/sclera: Conjunctivae normal.     Pupils: Pupils are equal, round, and reactive to light.  Cardiovascular:     Rate and  Rhythm: Normal rate and regular rhythm.     Heart sounds: No murmur.  Pulmonary:     Effort: Pulmonary effort is normal.     Breath sounds: Normal breath sounds.  Musculoskeletal: Normal range of motion.  Skin:    General: Skin is warm and dry.  Neurological:     Mental Status: He is alert and oriented to person, place, and time.  Psychiatric:        Mood and Affect: Mood normal.        Behavior: Behavior normal.        Thought Content: Thought content normal.        Judgment: Judgment normal.     ASSESSMENT/PLAN:   1. Hypertension, unspecified type No medication changes warranted at the present time.   - Urinalysis Dipstick  2. Type 2 diabetes mellitus with other specified complication, without long-term current use of insulin ([place]) Praised for weight loss and lifestyle changes.  - Referral to Nutrition and Diabetes Services - Dulaglutide (TRULICITY) [id number] SOPN; Inject 0.75 mg into the skin once a week.  Dispense: 4 pen; Refill: 5    Return in about 4 weeks (around 09/23/2019) for DM2 New medication.    The patient was given clear instructions to go to ER or return to medical center if symptoms do not improve, worsen or new problems develop. The patient verbalized understanding and agreed  with plan of Merritt.   Ms. [name]. Nathaneil Canary, FNP-BC [facility] [address] [place], [place] [address] [phone]

## 2019-08-26 NOTE — Patient Instructions (Signed)

## 2019-08-27 MED FILL — TRULICITY 0.75 MG/0.5 ML PE: 0.75 | 28 days supply | Qty: 2 | Fill #0

## 2019-09-02 ENCOUNTER — Encounter (HOSPITAL_COMMUNITY): Payer: Self-pay

## 2019-09-02 ENCOUNTER — Encounter (HOSPITAL_COMMUNITY): Payer: Self-pay | Admitting: *Deleted

## 2019-09-02 MED FILL — AMOXICILLIN 500 MG CAPSULE: 500 | 7 days supply | Qty: 21 | Fill #0

## 2019-09-02 MED FILL — IBUPROFEN 600 MG TABLET: 600 | 3 days supply | Qty: 12 | Fill #0

## 2019-09-08 MED FILL — LISINOPRIL-HCTZ 20-12.5 MG: 20-12.5 | 30 days supply | Qty: 30 | Fill #9

## 2019-09-22 DIAGNOSIS — M5412 Radiculopathy, cervical region: Secondary | ICD-10-CM | POA: Diagnosis not present

## 2019-09-23 ENCOUNTER — Other Ambulatory Visit: Payer: Self-pay

## 2019-09-23 ENCOUNTER — Telehealth: Payer: Self-pay | Admitting: Family Medicine

## 2019-09-23 ENCOUNTER — Ambulatory Visit: Payer: Medicaid Other | Admitting: Family Medicine

## 2019-09-23 DIAGNOSIS — N529 Male erectile dysfunction, unspecified: Secondary | ICD-10-CM

## 2019-09-23 MED ORDER — SILDENAFIL CITRATE 100 MG PO TABS: 100.0000 mg | ORAL_TABLET | Freq: Every day | ORAL | 2 refills | Status: DC | PRN

## 2019-09-23 MED FILL — JARDIANCE 10 MG TABLET: 10 | 30 days supply | Qty: 30 | Fill #4

## 2019-09-23 MED FILL — TRULICITY 0.75 MG/0.5 ML PE: 0.75 | 28 days supply | Qty: 2 | Fill #1

## 2019-09-23 MED FILL — metFORMIN HCL 1000 MG TABS: 1000 | 30 days supply | Qty: 60 | Fill #1

## 2019-09-23 NOTE — Telephone Encounter (Signed)
Refill sent in for viagra. Thanks!

## 2019-10-08 MED FILL — LISINOPRIL-HCTZ 20-12.5 MG: 20-12.5 | 30 days supply | Qty: 30 | Fill #10

## 2019-10-19 ENCOUNTER — Ambulatory Visit: Payer: Medicaid Other | Admitting: Family Medicine

## 2019-10-19 DIAGNOSIS — M5412 Radiculopathy, cervical region: Secondary | ICD-10-CM | POA: Diagnosis not present

## 2019-10-21 MED FILL — TRULICITY 0.75 MG/0.5 ML PE: 0.75 | 28 days supply | Qty: 2 | Fill #2

## 2019-10-21 MED FILL — JARDIANCE 10 MG TABLET: 10 | 30 days supply | Qty: 30 | Fill #5

## 2019-10-21 MED FILL — metFORMIN HCL 1000 MG TABS: 1000 | 30 days supply | Qty: 60 | Fill #2

## 2019-10-28 ENCOUNTER — Other Ambulatory Visit: Payer: Self-pay

## 2019-10-28 ENCOUNTER — Encounter: Payer: Self-pay | Admitting: Family Medicine

## 2019-10-28 ENCOUNTER — Ambulatory Visit (INDEPENDENT_AMBULATORY_CARE_PROVIDER_SITE_OTHER): Payer: Medicaid Other | Admitting: Family Medicine

## 2019-10-28 VITALS — BP 107/77 | HR 97 | Temp 98.2°F | Ht 69.0 in | Wt 238.4 lb

## 2019-10-28 DIAGNOSIS — Z09 Encounter for follow-up examination after completed treatment for conditions other than malignant neoplasm: Secondary | ICD-10-CM

## 2019-10-28 DIAGNOSIS — I10 Essential (primary) hypertension: Secondary | ICD-10-CM | POA: Diagnosis not present

## 2019-10-28 DIAGNOSIS — E1169 Type 2 diabetes mellitus with other specified complication: Secondary | ICD-10-CM

## 2019-10-28 LAB — POCT GLYCOSYLATED HEMOGLOBIN (HGB A1C): Hemoglobin A1C: 6.6 % — AB (ref 4.0–5.6)

## 2019-10-28 LAB — POCT URINALYSIS DIPSTICK
Bilirubin, UA: NEGATIVE
Blood, UA: NEGATIVE
Glucose, UA: POSITIVE — AB
Ketones, UA: NEGATIVE
Nitrite, UA: NEGATIVE
Protein, UA: NEGATIVE
Spec Grav, UA: 1.02 (ref 1.010–1.025)
Urobilinogen, UA: 0.2 E.U./dL
pH, UA: 5 (ref 5.0–8.0)

## 2019-10-28 LAB — GLUCOSE, POCT (MANUAL RESULT ENTRY): POC Glucose: 110 mg/dl — AB (ref 70–99)

## 2019-10-28 NOTE — Progress Notes (Signed)
Patient [facility] Merr[facility]    Established Patient Office Visit  Subjective:  Patient ID: Kyle Merritt, male    DOB: 01-21-1961  Age: 58 y.o. MRN: [id number]  CC:  Chief Complaint  Patient presents with  . Follow-up    Med management, surgical clearance    HPI Kyle Merritt is a 58 year old male who presents for Follow Up today.   Past Medical History:  Diagnosis Date  . Colon cancer ([place])   . Diabetes mellitus without complication ([place])   . High cholesterol   . History of colon cancer   . Hypertension   Current Status: This will be his initial office visit with me. He was previously seeing Kyle Boast, NP for his PCP needs. Since his last office visit, he is doing well with no complaints. He is requesting medical clearance for right knee surgery since his Hgb A1c has decreased. e denies fatigue, frequent urination, blurred vision, excessive hunger, excessive thirst, weight gain, weight loss, and poor wound healing. He continues to check his feet regularly. He continues to have chronic right knee pain, in which he is requiring surgical clearance, with stable Hgb A1c, in order to proceed. His Hgb A1c is stable today at 6.6. He is requesting to to discontinue Jardiance. He denies visual changes, chest pain, cough, shortness of breath, heart palpitations, and falls. He has occasional headaches and dizziness with position changes. Denies severe headaches, confusion, seizures, double vision, and blurred vision, nausea and vomiting. He denies fevers, chills, recent infections, weight loss, and night sweats. No reports of GI problems such as diarrhea, and constipation. He has no reports of blood in stools, dysuria and hematuria. No depression or anxiety reported today.  He has moderate right knee pain today.   Past Surgical History:  Procedure Laterality Date  . COLON SURGERY    . KNEE ARTHROSCOPY Bilateral   . TRIGGER FINGER RELEASE Right 05/22/2019     Family History  Problem Relation Age of Onset  . Diabetes Mother   . Hypertension Mother   . Colon cancer Paternal Uncle   . Esophageal cancer Neg Hx   . Inflammatory bowel disease Neg Hx   . Liver disease Neg Hx   . Pancreatic cancer Neg Hx   . Stomach cancer Neg Hx     Social History   Socioeconomic History  . Marital status: Divorced    Spouse name: Not on file  . Number of children: Not on file  . Years of education: Not on file  . Highest education level: Not on file  Occupational History  . Not on file  Social Needs  . Financial resource strain: Not on file  . Food insecurity    Worry: Not on file    Inability: Not on file  . Transportation needs    Medical: Not on file    Non-medical: Not on file  Tobacco Use  . Smoking status: Former Smoker    Quit date: 2018    Years since quitting: 2.8  . Smokeless tobacco: Never Used  Substance and Sexual Activity  . Alcohol use: No  . Drug use: No  . Sexual activity: Yes  Lifestyle  . Physical activity    Days per week: Not on file    Minutes per session: Not on file  . Stress: Not on file  Relationships  . Social Herbalist on phone: Not on file    Gets together: Not on  file    Attends religious service: Not on file    Active member of club or organization: Not on file    Attends meetings of clubs or organizations: Not on file    Relationship status: Not on file  . Intimate partner violence    Fear of current or ex partner: Not on file    Emotionally abused: Not on file    Physically abused: Not on file    Forced sexual activity: Not on file  Other Topics Concern  . Not on file  Social History Narrative  . Not on file    Outpatient Medications Prior to Visit  Medication Sig Dispense Refill  . ACCU-CHEK GUIDE test strip USE AS DIRECTED UP TO 4 TIMES DAILY. 100 each 0  . atorvastatin (LIPITOR) 40 MG tablet Take 1 tablet (40 mg total) by mouth daily. 90 tablet 3  . blood glucose meter kit and  supplies KIT Dispense based on patient and insurance preference. Use up to four times daily as directed. (FOR ICD-10  E11.9). 1 each 0  . clotrimazole-betamethasone (LOTRISONE) cream Apply to both feet and between toes bid x 4 weeks. 45 g 0  . Dulaglutide (TRULICITY) 1.61 WR/6.0AV SOPN Inject 0.75 mg into the skin once a week. 4 pen 5  . empagliflozin (JARDIANCE) 10 MG TABS tablet Take 10 mg by mouth daily. 30 tablet 5  . gabapentin (NEURONTIN) 300 MG capsule Take 1 capsule (300 mg total) by mouth 4 (four) times daily. 120 capsule 3  . ibuprofen (ADVIL,MOTRIN) 800 MG tablet Take 1 tablet (800 mg total) by mouth every 8 (eight) hours as needed. 30 tablet 0  . lisinopril-hydrochlorothiazide (PRINZIDE,ZESTORETIC) 20-12.5 MG tablet Take 1 tablet by mouth daily. 90 tablet 3  . metFORMIN (GLUCOPHAGE) 1000 MG tablet Take 1 tablet (1,000 mg total) by mouth 2 (two) times daily with a meal. 60 tablet 5  . sildenafil (VIAGRA) 100 MG tablet Take 1 tablet (100 mg total) by mouth daily as needed for erectile dysfunction. 10 tablet 2   Facility-Administered Medications Prior to Visit  Medication Dose Route Frequency Provider Last Rate Last Dose  . 0.9 %  sodium chloride infusion  500 mL Intravenous Once Mansouraty, Telford Nab., MD        No Known Allergies  ROS Review of Systems  Constitutional: Negative.   HENT: Negative.   Eyes: Negative.   Respiratory: Negative.   Cardiovascular: Negative.   Gastrointestinal: Positive for abdominal distention.  Endocrine: Negative.   Genitourinary: Negative.   Musculoskeletal: Positive for joint swelling (chronic right knee pain).  Skin: Negative.   Allergic/Immunologic: Negative.   Hematological: Negative.   Psychiatric/Behavioral: Negative.       Objective:    Physical Exam  Constitutional: He is oriented to person, place, and time. He appears well-developed and well-nourished.  HENT:  Head: Normocephalic and atraumatic.  Eyes: Conjunctivae are normal.   Neck: Normal range of motion. Neck supple.  Cardiovascular: Normal rate, regular rhythm, normal heart sounds and intact distal pulses.  Pulmonary/Chest: Effort normal and breath sounds normal.  Abdominal: Soft. Bowel sounds are normal.  Musculoskeletal: Normal range of motion.     Comments: Limited ROM in right knee.   Neurological: He is alert and oriented to person, place, and time. He has normal reflexes.  Skin: Skin is warm and dry.  Psychiatric: He has a normal mood and affect. His behavior is normal. Judgment and thought content normal.  Nursing note and vitals reviewed.   BP  107/77   Pulse 97   Temp 98.2 F (36.8 C) (Oral)   Ht 5' 9"  (1.753 m)   Wt 238 lb 6.4 oz (108.1 kg)   SpO2 98%   BMI 35.21 kg/m  Wt Readings from Last 3 Encounters:  10/28/19 238 lb 6.4 oz (108.1 kg)  08/26/19 247 lb (112 kg)  07/30/19 250 lb (113.4 kg)     Health Maintenance Due  Topic Date Due  . FOOT EXAM  04/29/2019  . OPHTHALMOLOGY EXAM  07/24/2019    There are no preventive care reminders to display for this patient.  Lab Results  Component Value Date   TSH 0.463 02/03/2010   Lab Results  Component Value Date   WBC 5.9 10/06/2018   HGB 11.9 (L) 10/06/2018   HCT 36.6 (L) 10/06/2018   MCV 80.2 10/06/2018   PLT 252.0 10/06/2018   Lab Results  Component Value Date   NA 140 05/25/2019   K 4.2 05/25/2019   CO2 22 05/25/2019   GLUCOSE 152 (H) 05/25/2019   BUN 25 05/25/2019   CREATININE 1.43 (H) 05/25/2019   BILITOT 0.8 05/25/2019   ALKPHOS 91 05/25/2019   AST 13 05/25/2019   ALT 16 05/25/2019   PROT 7.1 05/25/2019   ALBUMIN 4.2 05/25/2019   CALCIUM 9.9 05/25/2019   ANIONGAP 13 03/20/2018   Lab Results  Component Value Date   CHOL 176 01/29/2018   Lab Results  Component Value Date   HDL 74 01/29/2018   Lab Results  Component Value Date   LDLCALC 82 01/29/2018   Lab Results  Component Value Date   TRIG 102 01/29/2018   Lab Results  Component Value Date    CHOLHDL 2.4 01/29/2018   Lab Results  Component Value Date   HGBA1C 6.6 (A) 10/28/2019      Assessment & Plan:   1. Hypertension, unspecified type The current medical regimen is effective; blood pressure is stable at 107/77 today; continue present plan and medications as prescribed. He will continue to take medications as prescribed, to decrease high sodium intake, excessive alcohol intake, increase potassium intake, smoking cessation, and increase physical activity of at least 30 minutes of cardio activity daily. He will continue to follow Heart Healthy or DASH diet. - POCT urinalysis dipstick  2. Type 2 diabetes mellitus with other specified complication, without long-term current use of insulin ([facility]) Hgb A1c is decreased at 6.6 today. We will discontinue Jardiance per patient's request today. He will continue medication as prescribed, to decrease foods/beverages high in sugars and carbs and follow Heart Healthy or DASH diet. Increase physical activity to at least 30 minutes cardio exercise daily.  - POCT urinalysis dipstick - POCT glycosylated hemoglobin (Hb A1C) - POCT glucose (manual entry)  3. Follow up He will follow up in 6 months.   No orders of the defined types were placed in this encounter.   Orders Placed This Encounter  Procedures  . POCT urinalysis dipstick  . POCT glycosylated hemoglobin (Hb A1C)  . POCT glucose (manual entry)   Referral Orders  No referral(s) requested today    Kathe Becton,  MSN, FNP-BC [place], [place] [address] [phone]- fax   Problem List Items Addressed This Visit      Cardiovascular and Mediastinum   HTN (hypertension) - Primary   Relevant Orders   POCT urinalysis dipstick (Completed)     Endocrine   Diabetes (  [place])   Relevant Orders   POCT urinalysis dipstick (Completed)   POCT glycosylated hemoglobin (Hb A1C)  (Completed)   POCT glucose (manual entry) (Completed)    Other Visit Diagnoses    Follow up          No orders of the defined types were placed in this encounter.   Follow-up: Return in about 6 months (around 04/26/2020).    Azzie Glatter, FNP

## 2019-11-04 ENCOUNTER — Telehealth: Payer: Self-pay | Admitting: Family Medicine

## 2019-11-04 NOTE — Telephone Encounter (Signed)
Called and spoke with patient, will fax [email or web address] to dr. Gabriel Carina requested. Thanks!

## 2019-11-06 ENCOUNTER — Other Ambulatory Visit: Payer: Self-pay | Admitting: Orthopedic Surgery

## 2019-11-06 ENCOUNTER — Ambulatory Visit (INDEPENDENT_AMBULATORY_CARE_PROVIDER_SITE_OTHER): Payer: Medicaid Other | Admitting: Podiatry

## 2019-11-06 ENCOUNTER — Other Ambulatory Visit: Payer: Self-pay

## 2019-11-06 ENCOUNTER — Encounter: Payer: Self-pay | Admitting: Podiatry

## 2019-11-06 ENCOUNTER — Other Ambulatory Visit: Payer: Self-pay | Admitting: Family Medicine

## 2019-11-06 DIAGNOSIS — B351 Tinea unguium: Secondary | ICD-10-CM | POA: Diagnosis not present

## 2019-11-06 DIAGNOSIS — M79675 Pain in left toe(s): Secondary | ICD-10-CM

## 2019-11-06 DIAGNOSIS — E119 Type 2 diabetes mellitus without complications: Secondary | ICD-10-CM

## 2019-11-06 DIAGNOSIS — M79674 Pain in right toe(s): Secondary | ICD-10-CM

## 2019-11-06 DIAGNOSIS — I10 Essential (primary) hypertension: Secondary | ICD-10-CM

## 2019-11-06 MED FILL — LISINOPRIL-HCTZ 20-12.5 MG: 20-12.5 | 30 days supply | Qty: 30 | Fill #0

## 2019-11-06 NOTE — Patient Instructions (Signed)

## 2019-11-12 NOTE — Progress Notes (Signed)
Subjective: Kyle Merritt is seen today for follow up painful, elongated, thickened toenails 1-5 b/l feet that he cannot cut. Pain interferes with daily activities. Aggravating factor includes wearing enclosed shoe gear and relieved with periodic debridement.  He states he used all of his Lotrisone Cream. He feels his feet look much better.  He will be having a total knee replacement on December 7th.  Current Outpatient Medications on File Prior to Visit  Medication Sig  . ACCU-CHEK GUIDE test strip USE AS DIRECTED UP TO 4 TIMES DAILY.  [name] atorvastatin (LIPITOR) 40 MG tablet Take 1 tablet (40 mg total) by mouth daily.  . blood glucose meter kit and supplies KIT Dispense based on patient and insurance preference. Use up to four times daily as directed. (FOR ICD-10  E11.9).  . clotrimazole-betamethasone (LOTRISONE) cream Apply to both feet and between toes bid x 4 weeks.  . Dulaglutide (TRULICITY) 3.55 HR/4.1UL SOPN Inject 0.75 mg into the skin once a week.  . empagliflozin (JARDIANCE) 10 MG TABS tablet Take 10 mg by mouth daily.  [name] gabapentin (NEURONTIN) 300 MG capsule Take 1 capsule (300 mg total) by mouth 4 (four) times daily.  [name] ibuprofen (ADVIL,MOTRIN) 800 MG tablet Take 1 tablet (800 mg total) by mouth every 8 (eight) hours as needed.  . meloxicam (MOBIC) 15 MG tablet   . metFORMIN (GLUCOPHAGE) 1000 MG tablet Take 1 tablet (1,000 mg total) by mouth 2 (two) times daily with a meal.  . naproxen (NAPROSYN) 500 MG tablet   . sildenafil (VIAGRA) 100 MG tablet Take 1 tablet (100 mg total) by mouth daily as needed for erectile dysfunction.  . sildenafil (VIAGRA) 50 MG tablet TAKE 1 TO 2 TABLETS BY MOUTH ONCE DAILY AS NEEDED FOR ERECTILE DYSFUNCTION.   Current Facility-Administered Medications on File Prior to Visit  Medication  . 0.9 %  sodium chloride infusion     No Known Allergies   Objective:  Vascular Examination: Capillary refill time <3 seconds b/l.  Dorsalis pedis present  b/l.  Posterior tibial pulses present b/l.  Digital hair sparse b/l.  Skin temperature gradient WNL b/l.   Dermatological Examination: Skin with normal turgor, texture and tone b/l  Toenails 1-5 b/l discolored, thick, dystrophic with subungual debris and pain with palpation to nailbeds due to thickness of nails.  Hyperkeratotic lesion submet head 1 b/l. No edema, no erythema, no drainage, no flocculence.  Tinea pedis resolved b/l.   Musculoskeletal: Muscle strength 5/5 to all LE muscle groups  No gross bony deformities b/l.  No pain, crepitus or joint limitation noted with ROM.   Neurological Examination: Protective sensation intact with 10 gram monofilament bilaterally.  Epicritic sensation present bilaterally.  Vibratory sensation intact bilaterally.   Assessment: Painful onychomycosis toenails 1-5 b/l  Calluses submet head 1 b/l Resolved tinea pedis b/l NIDDM  Plan: 1. Toenails 1-5 b/l were debrided in length and girth without iatrogenic bleeding. 2. As a courtesy, calluses pared submetatarsal head 1 b/l utilizing sterile scalpel blade without incident. 3. Patient to continue soft, supportive shoe gear.  4. Patient to report any pedal injuries to medical professional immediately. 5. Follow up 3 months.  6. Patient/POA to call should there be a concern in the interim.

## 2019-11-13 ENCOUNTER — Ambulatory Visit: Payer: Medicaid Other | Admitting: Registered"

## 2019-11-17 DIAGNOSIS — M1711 Unilateral primary osteoarthritis, right knee: Secondary | ICD-10-CM | POA: Diagnosis not present

## 2019-11-23 ENCOUNTER — Other Ambulatory Visit: Payer: Self-pay | Admitting: Family Medicine

## 2019-11-23 DIAGNOSIS — E1169 Type 2 diabetes mellitus with other specified complication: Secondary | ICD-10-CM

## 2019-11-23 MED FILL — JARDIANCE 10 MG TABLET: 10 | 30 days supply | Qty: 30 | Fill #0

## 2019-11-23 MED FILL — TRULICITY 0.75 MG/0.5 ML PE: 0.75 | 28 days supply | Qty: 2 | Fill #3

## 2019-11-23 MED FILL — metFORMIN HCL 1000 MG TABS: 1000 | 30 days supply | Qty: 60 | Fill #3

## 2019-11-24 NOTE — Patient Instructions (Addendum)
DUE TO COVID-19 ONLY ONE VISITOR IS ALLOWED TO COME WITH YOU AND STAY IN THE WAITING ROOM ONLY DURING PRE OP AND PROCEDURE DAY OF SURGERY. THE 1 VISITOR MAY VISIT WITH YOU AFTER SURGERY IN YOUR PRIVATE ROOM DURING VISITING HOURS ONLY!  YOU NEED TO HAVE A COVID 19 TEST ON__Thursday 12/03/2020_____ @__  1030 am_____, THIS TEST MUST BE DONE BEFORE SURGERY, COME  [address], [place] , [address].  ([name]) ONCE YOUR COVID TEST IS COMPLETED, PLEASE BEGIN THE QUARANTINE INSTRUCTIONS AS OUTLINED IN YOUR HANDOUT.                Kyle Merritt    Your procedure is scheduled on: Monday 11/30/2019   Report to [facility] Main  Entrance    Report to admitting at  0950  AM    How to Manage Your Diabetes Before and After Surgery  Why is it important to control my blood sugar before and after surgery? . Improving blood sugar levels before and after surgery helps healing and can limit problems. . A way of improving blood sugar control is eating a healthy diet by: o  Eating less sugar and carbohydrates o  Increasing activity/exercise o  Talking with your doctor about reaching your blood sugar goals . High blood sugars (greater than 180 mg/dL) can raise your risk of infections and slow your recovery, so you will need to focus on controlling your diabetes during the weeks before surgery. . Make sure that the doctor who takes care of your diabetes knows about your planned surgery including the date and location.  How do I manage my blood sugar before surgery? . Check your blood sugar at least 4 times a day, starting 2 days before surgery, to make sure that the level is not too high or low. o Check your blood sugar the morning of your surgery when you wake up and every 2 hours until you get to the Short Stay unit. . If your blood sugar is less than 70 mg/dL, you will need to treat for low blood sugar: o Do not take insulin. o Treat a low blood sugar (less than 70 mg/dL)  with  cup of clear juice (cranberry or apple), 4 glucose tablets, OR glucose gel. o Recheck blood sugar in 15 minutes after treatment (to make sure it is greater than 70 mg/dL). If your blood sugar is not greater than 70 mg/dL on recheck, call [phone] for further instructions. . Report your blood sugar to the short stay nurse when you get to Short Stay.  . If you are admitted to the hospital after surgery: o Your blood sugar will be checked by the staff and you will probably be given insulin after surgery (instead of oral diabetes medicines) to make sure you have good blood sugar levels. o The goal for blood sugar control after surgery is 80-180 mg/dL.   WHAT DO I DO ABOUT MY DIABETES MEDICATION?        The day before surgery , DO NOT TAKE JARDIANCE medication!         The day before surgery, Take Metformin (Glucophage) as prescribed!  . Do not take oral diabetes medicines (pills) the morning of surgery.        Call this number if you have problems the morning of surgery [phone]    Remember: Do not eat food  :After Midnight.   NO SOLID FOOD AFTER MIDNIGHT THE NIGHT PRIOR TO SURGERY. NOTHING BY MOUTH EXCEPT  CLEAR LIQUIDS UNTIL  0920 am .     PLEASE FINISH  Gatorade G 2 DRINK PER SURGEON ORDER  WHICH NEEDS TO BE COMPLETED AT  0920 am .   CLEAR LIQUID DIET   Foods Allowed                                                                     Foods Excluded  Coffee and tea, regular and decaf                             liquids that you cannot  Plain Jell-O any favor except red or purple                                           see through such as: Fruit ices (not with fruit pulp)                                     milk, soups, orange juice  Iced Popsicles                                    All solid food Carbonated beverages, regular and diet                                    Cranberry, grape and apple juices Sports drinks like Gatorade Lightly seasoned clear broth or  consume(fat free) Sugar, honey syrup  Sample Menu Breakfast                                Lunch                                     Supper Cranberry juice                    Beef broth                            Chicken broth Jell-O                                     Grape juice                           Apple juice Coffee or tea                        Jell-O  Popsicle                                                Coffee or tea                        Coffee or tea  _____________________________________________________________________      BRUSH YOUR TEETH MORNING OF SURGERY AND RINSE YOUR MOUTH OUT, NO CHEWING GUM CANDY OR MINTS.     Take these medicines the morning of surgery with A SIP OF WATER: none   DO NOT TAKE ANY DIABETIC MEDICATIONS DAY OF YOUR SURGERY!                               You may not have any metal on your body including hair pins and              piercings  Do not wear jewelry, make-up, lotions, powders or perfumes, deodorant                          Men may shave face and neck.   Do not bring valuables to the hospital. [place].  Contacts, dentures or bridgework may not be worn into surgery.  Leave suitcase in the car. After surgery it may be brought to your room.                Please read over the following fact sheets you were given: _____________________________________________________________________             [facility] - Preparing for Surgery Before surgery, you can play an important role.  Because skin is not sterile, your skin needs to be as free of germs as possible.  You can reduce the number of germs on your skin by washing with CHG (chlorahexidine gluconate) soap before surgery.  CHG is an antiseptic cleaner which kills germs and bonds with the skin to continue killing germs even after washing. Please DO NOT use if you have an allergy to CHG or  antibacterial soaps.  If your skin becomes reddened/irritated stop using the CHG and inform your nurse when you arrive at Short Stay. Do not shave (including legs and underarms) for at least 48 hours prior to the first CHG shower.  You may shave your face/neck. Please follow these instructions carefully:  1.  Shower with CHG Soap the night before surgery and the  morning of Surgery.  2.  If you choose to wash your hair, wash your hair first as usual with your  normal  shampoo.  3.  After you shampoo, rinse your hair and body thoroughly to remove the  shampoo.                           4.  Use CHG as you would any other liquid soap.  You can apply chg directly  to the skin and wash                       Gently with a scrungie or clean washcloth.  5.  Apply the CHG Soap  to your body ONLY FROM THE NECK DOWN.   Do not use on face/ open                           Wound or open sores. Avoid contact with eyes, ears mouth and genitals (private parts).                       Wash face,  Genitals (private parts) with your normal soap.             6.  Wash thoroughly, paying special attention to the area where your surgery  will be performed.  7.  Thoroughly rinse your body with warm water from the neck down.  8.  DO NOT shower/wash with your normal soap after using and rinsing off  the CHG Soap.                9.  Pat yourself dry with a clean towel.            10.  Wear clean pajamas.            11.  Place clean sheets on your bed the night of your first shower and do not  sleep with pets. Day of Surgery : Do not apply any lotions/deodorants the morning of surgery.  Please wear clean clothes to the hospital/surgery center.  FAILURE TO FOLLOW THESE INSTRUCTIONS MAY RESULT IN THE CANCELLATION OF YOUR SURGERY PATIENT SIGNATURE_________________________________  NURSE SIGNATURE__________________________________  ________________________________________________________________________   Adam Phenix  An incentive spirometer is a tool that can help keep your lungs clear and active. This tool measures how well you are filling your lungs with each breath. Taking long deep breaths may help reverse or decrease the chance of developing breathing (pulmonary) problems (especially infection) following:  A long period of time when you are unable to move or be active. BEFORE THE PROCEDURE   If the spirometer includes an indicator to show your best effort, your nurse or respiratory therapist will set it to a desired goal.  If possible, sit up straight or lean slightly forward. Try not to slouch.  Hold the incentive spirometer in an upright position. INSTRUCTIONS FOR USE  1. Sit on the edge of your bed if possible, or sit up as far as you can in bed or on a chair. 2. Hold the incentive spirometer in an upright position. 3. Breathe out normally. 4. Place the mouthpiece in your mouth and seal your lips tightly around it. 5. Breathe in slowly and as deeply as possible, raising the piston or the ball toward the top of the column. 6. Hold your breath for 3-5 seconds or for as long as possible. Allow the piston or ball to fall to the bottom of the column. 7. Remove the mouthpiece from your mouth and breathe out normally. 8. Rest for a few seconds and repeat Steps 1 through 7 at least 10 times every 1-2 hours when you are awake. Take your time and take a few normal breaths between deep breaths. 9. The spirometer may include an indicator to show your best effort. Use the indicator as a goal to work toward during each repetition. 10. After each set of 10 deep breaths, practice coughing to be sure your lungs are clear. If you have an incision (the cut made at the time of surgery), support your incision when coughing by placing a pillow or rolled up towels firmly  against it. Once you are able to get out of bed, walk around indoors and cough well. You may stop using the incentive spirometer when  instructed by your caregiver.  RISKS AND COMPLICATIONS  Take your time so you do not get dizzy or light-headed.  If you are in pain, you may need to take or ask for pain medication before doing incentive spirometry. It is harder to take a deep breath if you are having pain. AFTER USE  Rest and breathe slowly and easily.  It can be helpful to keep track of a log of your progress. Your caregiver can provide you with a simple table to help with this. If you are using the spirometer at home, follow these instructions: [place] IF:   You are having difficultly using the spirometer.  You have trouble using the spirometer as often as instructed.  Your pain medication is not giving enough relief while using the spirometer.  You develop fever of 100.5 F (38.1 C) or higher. SEEK IMMEDIATE MEDICAL CARE IF:   You cough up bloody sputum that had not been present before.  You develop fever of 102 F (38.9 C) or greater.  You develop worsening pain at or near the incision site. MAKE SURE YOU:   Understand these instructions.  Will watch your condition.  Will get help right away if you are not doing well or get worse. Document Released: 04/22/2007 Document Revised: 03/03/2012 Document Reviewed: 06/23/2007 ExitCare Patient Information 2014 Exit[employer], [place].   ________________________________________________________________________  WHAT IS A BLOOD TRANSFUSION? Blood Transfusion Information  A transfusion is the replacement of blood or some of its parts. Blood is made up of multiple cells which provide different functions.  Red blood cells carry oxygen and are used for blood loss replacement.  White blood cells fight against infection.  Platelets control bleeding.  Plasma helps clot blood.  Other blood products are available for specialized needs, such as hemophilia or other clotting disorders. BEFORE THE TRANSFUSION  Who gives blood for transfusions?   Healthy  volunteers who are fully evaluated to make sure their blood is safe. This is blood bank blood. Transfusion therapy is the safest it has ever been in the practice of medicine. Before blood is taken from a donor, a complete history is taken to make sure that person has no history of diseases nor engages in risky social behavior (examples are intravenous drug use or sexual activity with multiple partners). The donor's travel history is screened to minimize risk of transmitting infections, such as malaria. The donated blood is tested for signs of infectious diseases, such as HIV and hepatitis. The blood is then tested to be sure it is compatible with you in order to minimize the chance of a transfusion reaction. If you or a relative donates blood, this is often done in anticipation of surgery and is not appropriate for emergency situations. It takes many days to process the donated blood. RISKS AND COMPLICATIONS Although transfusion therapy is very safe and saves many lives, the main dangers of transfusion include:   Getting an infectious disease.  Developing a transfusion reaction. This is an allergic reaction to something in the blood you were given. Every precaution is taken to prevent this. The decision to have a blood transfusion has been considered carefully by your caregiver before blood is given. Blood is not given unless the benefits outweigh the risks. AFTER THE TRANSFUSION  Right after receiving a blood transfusion, you will usually feel much better and  more energetic. This is especially true if your red blood cells have gotten low (anemic). The transfusion raises the level of the red blood cells which carry oxygen, and this usually causes an energy increase.  The nurse administering the transfusion will monitor you carefully for complications. HOME CARE INSTRUCTIONS  No special instructions are needed after a transfusion. You may find your energy is better. Speak with your caregiver about any  limitations on activity for underlying diseases you may have. SEEK MEDICAL CARE IF:   Your condition is not improving after your transfusion.  You develop redness or irritation at the intravenous (IV) site. SEEK IMMEDIATE MEDICAL CARE IF:  Any of the following symptoms occur over the next 12 hours:  Shaking chills.  You have a temperature by mouth above 102 F (38.9 C), not controlled by medicine.  Chest, back, or muscle pain.  People around you feel you are not acting correctly or are confused.  Shortness of breath or difficulty breathing.  Dizziness and fainting.  You get a rash or develop hives.  You have a decrease in urine output.  Your urine turns a dark color or changes to pink, red, or brown. Any of the following symptoms occur over the next 10 days:  You have a temperature by mouth above 102 F (38.9 C), not controlled by medicine.  Shortness of breath.  Weakness after normal activity.  The white part of the eye turns yellow (jaundice).  You have a decrease in the amount of urine or are urinating less often.  Your urine turns a dark color or changes to pink, red, or brown. Document Released: 12/07/2000 Document Revised: 03/03/2012 Document Reviewed: 07/26/2008 [facility] Patient Information 2014 [place], [place].  _______________________________________________________________________

## 2019-11-26 ENCOUNTER — Other Ambulatory Visit: Payer: Self-pay

## 2019-11-26 ENCOUNTER — Encounter (HOSPITAL_COMMUNITY)
Admission: RE | Admit: 2019-11-26 | Discharge: 2019-11-26 | Disposition: A | Discharge status: Home / Self Care | Payer: Medicaid Other | Source: Ambulatory Visit | Attending: Orthopedic Surgery | Admitting: Orthopedic Surgery

## 2019-11-26 ENCOUNTER — Ambulatory Visit (HOSPITAL_COMMUNITY)
Admission: RE | Admit: 2019-11-26 | Discharge: 2019-11-26 | Disposition: A | Discharge status: Home / Self Care | Payer: Medicaid Other | Source: Ambulatory Visit | Attending: Orthopedic Surgery | Admitting: Orthopedic Surgery

## 2019-11-26 ENCOUNTER — Other Ambulatory Visit (HOSPITAL_COMMUNITY)
Admission: RE | Admit: 2019-11-26 | Discharge: 2019-11-26 | Disposition: A | Discharge status: Home / Self Care | Payer: Medicaid Other | Source: Ambulatory Visit | Attending: Orthopedic Surgery | Admitting: Orthopedic Surgery

## 2019-11-26 ENCOUNTER — Encounter (HOSPITAL_COMMUNITY): Payer: Self-pay

## 2019-11-26 DIAGNOSIS — Z01818 Encounter for other preprocedural examination: Secondary | ICD-10-CM | POA: Diagnosis not present

## 2019-11-26 DIAGNOSIS — M1711 Unilateral primary osteoarthritis, right knee: Secondary | ICD-10-CM | POA: Diagnosis present

## 2019-11-26 HISTORY — DX: Unspecified osteoarthritis, unspecified site: M19.90

## 2019-11-26 LAB — CBC WITH DIFFERENTIAL/PLATELET
Abs Immature Granulocytes: 0.02 10*3/uL (ref 0.00–0.07)
Basophils Absolute: 0.1 10*3/uL (ref 0.0–0.1)
Basophils Relative: 1 %
Eosinophils Absolute: 0.3 10*3/uL (ref 0.0–0.5)
Eosinophils Relative: 4 %
HCT: 41.6 % (ref 39.0–52.0)
Hemoglobin: 13.1 g/dL (ref 13.0–17.0)
Immature Granulocytes: 0 %
Lymphocytes Relative: 27 %
Lymphs Abs: 1.9 10*3/uL (ref 0.7–4.0)
MCH: 25.5 pg — ABNORMAL LOW (ref 26.0–34.0)
MCHC: 31.5 g/dL (ref 30.0–36.0)
MCV: 81.1 fL (ref 80.0–100.0)
Monocytes Absolute: 0.5 10*3/uL (ref 0.1–1.0)
Monocytes Relative: 7 %
Neutro Abs: 4.2 10*3/uL (ref 1.7–7.7)
Neutrophils Relative %: 61 %
Platelets: 385 10*3/uL (ref 150–400)
RBC: 5.13 MIL/uL (ref 4.22–5.81)
RDW: 15.3 % (ref 11.5–15.5)
WBC: 6.9 10*3/uL (ref 4.0–10.5)
nRBC: 0 % (ref 0.0–0.2)

## 2019-11-26 LAB — APTT: aPTT: 36 seconds (ref 24–36)

## 2019-11-26 LAB — BASIC METABOLIC PANEL
Anion gap: 11 (ref 5–15)
BUN: 26 mg/dL — ABNORMAL HIGH (ref 6–20)
CO2: 27 mmol/L (ref 22–32)
Calcium: 9.3 mg/dL (ref 8.9–10.3)
Chloride: 101 mmol/L (ref 98–111)
Creatinine, Ser: 1.48 mg/dL — ABNORMAL HIGH (ref 0.61–1.24)
GFR calc Af Amer: 59 mL/min — ABNORMAL LOW (ref >60)
GFR calc non Af Amer: 51 mL/min — ABNORMAL LOW (ref >60)
Glucose, Bld: 170 mg/dL — ABNORMAL HIGH (ref 70–99)
Potassium: 4 mmol/L (ref 3.5–5.1)
Sodium: 139 mmol/L (ref 135–145)

## 2019-11-26 LAB — HEMOGLOBIN A1C
Hgb A1c MFr Bld: 7.1 % — ABNORMAL HIGH (ref 4.8–5.6)
Mean Plasma Glucose: 157.07 mg/dL

## 2019-11-26 LAB — GLUCOSE, CAPILLARY: Glucose-Capillary: 190 mg/dL — ABNORMAL HIGH (ref 70–99)

## 2019-11-26 LAB — SURGICAL PCR SCREEN
MRSA, PCR: NEGATIVE
Staphylococcus aureus: POSITIVE — AB

## 2019-11-26 LAB — PROTIME-INR
INR: 0.9 (ref 0.8–1.2)
Prothrombin Time: 12.5 seconds (ref 11.4–15.2)

## 2019-11-26 LAB — URINALYSIS, ROUTINE W REFLEX MICROSCOPIC
Bacteria, UA: NONE SEEN
Bilirubin Urine: NEGATIVE
Glucose, UA: >=500 mg/dL — AB
Hgb urine dipstick: NEGATIVE
Ketones, ur: NEGATIVE mg/dL
Nitrite: NEGATIVE
Protein, ur: NEGATIVE mg/dL
Specific Gravity, Urine: 1.021 (ref 1.005–1.030)
pH: 5 (ref 5.0–8.0)

## 2019-11-26 LAB — ABO/RH: ABO/RH(D): O POS

## 2019-11-26 NOTE — Progress Notes (Addendum)
PCP - Dr. Angelica Chessman Cardiologist - N/A  Chest x-ray - none EKG - 11/26/2019 Stress Test - N/A ECHO - N/A Cardiac Cath - N/A  Sleep Study - N/A CPAP -N/A   Fasting Blood Sugar - 120-122 Checks Blood Sugar __2___ times a day  Blood Thinner Instructions:N/A Aspirin Instructions:N/A Last Dose:  Anesthesia review:  Chart given to Konrad Felix, PA to review lab results!  Patient has a history of colon cancer, DM, and HTN.  Patient denies shortness of breath, fever, cough and chest pain at PAT appointment   Patient verbalized understanding of instructions that were given to them at the PAT appointment. Patient was also instructed that they will need to review over the PAT instructions again at home before surgery.

## 2019-11-26 NOTE — H&P (Signed)
TOTAL KNEE ADMISSION H&P  Patient is being admitted for right total knee arthroplasty.  Subjective:  Chief Complaint:right knee pain.  HPI: Kyle Merritt, 58 y.o. male, has a history of pain and functional disability in the right knee due to arthritis and has failed non-surgical conservative treatments for greater than 12 weeks to includeNSAID's and/or analgesics, corticosteriod injections, weight reduction as appropriate and activity modification.  Onset of symptoms was gradual, starting couple years ago with gradually worsening course since that time. The patient noted no past surgery on the right knee(s).  Patient currently rates pain in the right knee(s) at 10 out of 10 with activity. Patient has night pain, worsening of pain with activity and weight bearing, pain that interferes with activities of daily living, pain with passive range of motion, crepitus and joint swelling.  Patient has evidence of periarticular osteophytes and joint space narrowing by imaging studies.   There is no active infection.  Patient Active Problem List   Diagnosis Date Noted  . Constipation 10/06/2018  . Olecranon bursitis of left elbow 06/04/2018  . Trigger finger, acquired 06/04/2018  . Cervical disc disorder with radiculopathy of cervical region 05/08/2018  . Other chronic pain 02/04/2018  . History of cocaine use 02/04/2018  . Hyperlipidemia 02/04/2018  . Colon cancer screening 11/29/2014  . Knee pain, acute 10/04/2014  . Essential hypertension 08/02/2014  . Dental abscess 08/02/2014  . Diabetes ([place]) 04/29/2014  . HTN (hypertension) 04/29/2014  . Dyslipidemia 04/29/2014  . History of colon cancer 04/29/2014  . Back pain 04/29/2014  . Erectile dysfunction 04/29/2014  . Tobacco abuse 07/27/2013  . PURE HYPERCHOLESTEROLEMIA 12/31/2008  . TESTOSTERONE DEFICIENCY 12/10/2008  . GERD 12/08/2008  . ONYCHOMYCOSIS, BILATERAL 11/09/2008  . DIABETES MELLITUS 11/09/2008  . Anemia 11/09/2008  . Malignant  neoplasm of colon ([place]) 12/24/2002   Past Medical History:  Diagnosis Date  . Colon cancer ([place])   . Diabetes mellitus without complication ([place])   . High cholesterol   . History of colon cancer   . Hypertension     Past Surgical History:  Procedure Laterality Date  . COLON SURGERY    . KNEE ARTHROSCOPY Bilateral   . TRIGGER FINGER RELEASE Right 05/22/2019    Current Facility-Administered Medications  Medication Dose Route Frequency Provider Last Rate Last Dose  . 0.9 %  sodium chloride infusion  500 mL Intravenous Once Mansouraty, Telford Nab., MD       Current Outpatient Medications  Medication Sig Dispense Refill Last Dose  . atorvastatin (LIPITOR) 40 MG tablet Take 1 tablet (40 mg total) by mouth daily. (Patient taking differently: Take 40 mg by mouth every evening. ) 90 tablet 3   . Dulaglutide (TRULICITY) 5.94 VO/5.9YT SOPN Inject 0.75 mg into the skin once a week. (Patient taking differently: Inject 0.75 mg into the skin every Friday. ) 4 pen 5   . lisinopril-hydrochlorothiazide (ZESTORETIC) 20-12.5 MG tablet TAKE 1 TABLET BY MOUTH DAILY. 30 tablet 3   . metFORMIN (GLUCOPHAGE) 1000 MG tablet Take 1 tablet (1,000 mg total) by mouth 2 (two) times daily with a meal. 60 tablet 5   . sildenafil (VIAGRA) 100 MG tablet Take 1 tablet (100 mg total) by mouth daily as needed for erectile dysfunction. 10 tablet 2   . ACCU-CHEK GUIDE test strip USE AS DIRECTED UP TO 4 TIMES DAILY. 100 each 0   . blood glucose meter kit and supplies KIT Dispense based on patient and insurance preference. Use up to four times daily as  directed. (FOR ICD-10  E11.9). 1 each 0   . clotrimazole-betamethasone (LOTRISONE) cream Apply to both feet and between toes bid x 4 weeks. (Patient not taking: Reported on 11/20/2019) 45 g 0 Not Taking at Unknown time  . JARDIANCE 10 MG TABS tablet TAKE 1 TABLET BY MOUTH DAILY. 30 tablet 5    No Known Allergies  Social History   Tobacco Use  . Smoking status: Former Smoker     Quit date: 2018    Years since quitting: 2.9  . Smokeless tobacco: Never Used  Substance Use Topics  . Alcohol use: No    Family History  Problem Relation Age of Onset  . Diabetes Mother   . Hypertension Mother   . Colon cancer Paternal Uncle   . Esophageal cancer Neg Hx   . Inflammatory bowel disease Neg Hx   . Liver disease Neg Hx   . Pancreatic cancer Neg Hx   . Stomach cancer Neg Hx      Review of Systems  Constitutional: Negative.   HENT: Negative.   Eyes: Negative.   Respiratory: Negative.   Cardiovascular: Negative.   Gastrointestinal: Positive for nausea.  Genitourinary: Negative.   Musculoskeletal: Positive for joint pain.  Skin: Negative.   Neurological: Negative.   Endo/Heme/Allergies: Bruises/bleeds easily.  Psychiatric/Behavioral: Positive for depression. The patient is nervous/anxious.     Objective:  Physical Exam  Constitutional: He is oriented to person, place, and time. He appears well-developed and well-nourished.  HENT:  Head: Normocephalic and atraumatic.  Eyes: Pupils are equal, round, and reactive to light.  Neck: Normal range of motion. Neck supple.  Cardiovascular: Intact distal pulses.  Respiratory: Effort normal.  Musculoskeletal:        General: Tenderness present.     Comments: Range of motion of the right knee is from 3-95 with an ouch collateral ligaments are stable tender along the lateral joint line 1+ effusion neurovascular intact distally.  The contralateral left knee is tender along the medial joint line 5 varus deformity range of motion there is 5/120.  Toes are pink and well perfused minimally decreased sensation to the foot pads.  Neurological: He is alert and oriented to person, place, and time.  Skin: Skin is warm and dry.  Psychiatric: He has a normal mood and affect. His behavior is normal. Judgment and thought content normal.    Vital signs in last 24 hours: BP: ()/()  Arterial Line BP: ()/()    Labs:   Estimated body mass index is 35.21 kg/m as calculated from the following:   Height as of 10/28/19: '5\' 9"'$  (1.753 m).   Weight as of 10/28/19: 108.1 kg.   Imaging Review Plain radiographs demonstrate  AP Rosen were lateral and sunrise x-rays of the right knee continue to show end-stage arthritis with erosion of the lateral tibial plateau lateral femoral condyle and large osteophytes.  On the AP and Lutricia Feil of the left knee he is down to bone-on-bone now.      Assessment/Plan:  End stage arthritis, right knee   The patient history, physical examination, clinical judgment of the provider and imaging studies are consistent with end stage degenerative joint disease of the right knee(s) and total knee arthroplasty is deemed medically necessary. The treatment options including medical management, injection therapy arthroscopy and arthroplasty were discussed at length. The risks and benefits of total knee arthroplasty were presented and reviewed. The risks due to aseptic loosening, infection, stiffness, patella tracking problems, thromboembolic complications and other imponderables were  discussed. The patient acknowledged the explanation, agreed to proceed with the plan and consent was signed. Patient is being admitted for inpatient treatment for surgery, pain control, PT, OT, prophylactic antibiotics, VTE prophylaxis, progressive ambulation and ADL's and discharge planning. The patient is planning to be discharged home with home health services     Patient's anticipated LOS is less than 2 midnights, meeting these requirements: - Younger than 57 - Lives within 1 hour of care - Has a competent adult at home to recover with post-op recover - NO history of  - Chronic pain requiring opiods  - Diabetes  - Coronary Artery Disease  - Heart failure  - Heart attack  - Stroke  - DVT/VTE  - Cardiac arrhythmia  - Respiratory Failure/COPD  - Renal failure  - Anemia  - Advanced Liver  disease

## 2019-11-27 NOTE — Care Plan (Signed)
Spoke with patient prior to surgery. He plans to discharge to home with family. HHPT referral to [facility] at [facility]. OPPT referral to [facility] OPPT on [place] walker ordered from [place].  Has care at home for same day discharge if needed.   Ladell Heads, [place]

## 2019-11-29 LAB — NOVEL CORONAVIRUS, NAA (HOSP ORDER, SEND-OUT TO REF LAB; TAT 18-24 HRS): SARS-CoV-2, NAA: NOT DETECTED

## 2019-11-29 MED ORDER — BUPIVACAINE LIPOSOME 1.3 % IJ SUSP
20.0000 mL | Freq: Once | INTRAMUSCULAR | Status: DC
  Filled 2019-11-29: qty 20

## 2019-11-29 MED ORDER — TRANEXAMIC ACID 1000 MG/10ML IV SOLN
2000.0000 mg | INTRAVENOUS | Status: DC
  Filled 2019-11-29: qty 20

## 2019-11-30 ENCOUNTER — Other Ambulatory Visit: Payer: Self-pay

## 2019-11-30 ENCOUNTER — Encounter (HOSPITAL_COMMUNITY)
Admission: RE | Disposition: A | Discharge status: Home / Self Care | Payer: Self-pay | Source: Home / Self Care | Attending: Orthopedic Surgery

## 2019-11-30 ENCOUNTER — Ambulatory Visit (HOSPITAL_COMMUNITY): Payer: Medicaid Other | Admitting: Physician Assistant

## 2019-11-30 ENCOUNTER — Ambulatory Visit (HOSPITAL_COMMUNITY): Payer: Medicaid Other | Admitting: Anesthesiology

## 2019-11-30 ENCOUNTER — Ambulatory Visit (HOSPITAL_COMMUNITY)
Admission: RE | Admit: 2019-11-30 | Discharge: 2019-12-01 | Disposition: A | Discharge status: Home / Self Care | Payer: Medicaid Other | Attending: Orthopedic Surgery | Admitting: Orthopedic Surgery

## 2019-11-30 ENCOUNTER — Telehealth: Payer: Self-pay

## 2019-11-30 ENCOUNTER — Encounter (HOSPITAL_COMMUNITY): Payer: Self-pay | Admitting: Emergency Medicine

## 2019-11-30 DIAGNOSIS — Z87891 Personal history of nicotine dependence: Secondary | ICD-10-CM | POA: Insufficient documentation

## 2019-11-30 DIAGNOSIS — Z7984 Long term (current) use of oral hypoglycemic drugs: Secondary | ICD-10-CM | POA: Insufficient documentation

## 2019-11-30 DIAGNOSIS — D62 Acute posthemorrhagic anemia: Secondary | ICD-10-CM | POA: Diagnosis not present

## 2019-11-30 DIAGNOSIS — E119 Type 2 diabetes mellitus without complications: Secondary | ICD-10-CM | POA: Diagnosis not present

## 2019-11-30 DIAGNOSIS — M25761 Osteophyte, right knee: Secondary | ICD-10-CM | POA: Diagnosis not present

## 2019-11-30 DIAGNOSIS — Z8249 Family history of ischemic heart disease and other diseases of the circulatory system: Secondary | ICD-10-CM | POA: Insufficient documentation

## 2019-11-30 DIAGNOSIS — Z85038 Personal history of other malignant neoplasm of large intestine: Secondary | ICD-10-CM | POA: Insufficient documentation

## 2019-11-30 DIAGNOSIS — Z96651 Presence of right artificial knee joint: Secondary | ICD-10-CM

## 2019-11-30 DIAGNOSIS — G8918 Other acute postprocedural pain: Secondary | ICD-10-CM | POA: Diagnosis not present

## 2019-11-30 DIAGNOSIS — Z79899 Other long term (current) drug therapy: Secondary | ICD-10-CM | POA: Diagnosis not present

## 2019-11-30 DIAGNOSIS — E785 Hyperlipidemia, unspecified: Secondary | ICD-10-CM | POA: Insufficient documentation

## 2019-11-30 DIAGNOSIS — I10 Essential (primary) hypertension: Secondary | ICD-10-CM | POA: Insufficient documentation

## 2019-11-30 DIAGNOSIS — M1711 Unilateral primary osteoarthritis, right knee: Secondary | ICD-10-CM | POA: Diagnosis not present

## 2019-11-30 DIAGNOSIS — E7800 Pure hypercholesterolemia, unspecified: Secondary | ICD-10-CM | POA: Insufficient documentation

## 2019-11-30 DIAGNOSIS — N529 Male erectile dysfunction, unspecified: Secondary | ICD-10-CM | POA: Diagnosis not present

## 2019-11-30 HISTORY — PX: TOTAL KNEE ARTHROPLASTY: SHX125

## 2019-11-30 LAB — TYPE AND SCREEN
ABO/RH(D): O POS
Antibody Screen: NEGATIVE

## 2019-11-30 LAB — GLUCOSE, CAPILLARY
Glucose-Capillary: 134 mg/dL — ABNORMAL HIGH (ref 70–99)
Glucose-Capillary: 120 mg/dL — ABNORMAL HIGH (ref 70–99)
Glucose-Capillary: 221 mg/dL — ABNORMAL HIGH (ref 70–99)

## 2019-11-30 SURGERY — ARTHROPLASTY, KNEE, TOTAL
Anesthesia: General | Site: Knee | Laterality: Right

## 2019-11-30 MED ORDER — CHLORHEXIDINE GLUCONATE 4 % EX LIQD: 60.0000 mL | Freq: Once | CUTANEOUS | Status: DC

## 2019-11-30 MED ORDER — PHENYLEPHRINE HCL-NACL 10-0.9 MG/250ML-% IV SOLN
INTRAVENOUS | Status: DC | PRN
  Administered 2019-11-30: 50 ug/min via INTRAVENOUS

## 2019-11-30 MED ORDER — SODIUM CHLORIDE (PF) 0.9 % IJ SOLN
INTRAMUSCULAR | Status: AC
  Filled 2019-11-30: qty 50

## 2019-11-30 MED ORDER — SODIUM CHLORIDE 0.9 % IR SOLN
Status: DC | PRN
  Administered 2019-11-30: 1000 mL

## 2019-11-30 MED ORDER — ROPIVACAINE HCL 7.5 MG/ML IJ SOLN
INTRAMUSCULAR | Status: DC | PRN
  Administered 2019-11-30: 20 mL via PERINEURAL

## 2019-11-30 MED ORDER — FENTANYL CITRATE (PF) 100 MCG/2ML IJ SOLN
25.0000 ug | INTRAMUSCULAR | Status: DC | PRN
  Administered 2019-11-30: 18:00:00 50 ug via INTRAVENOUS

## 2019-11-30 MED ORDER — EPINEPHRINE PF 1 MG/ML IJ SOLN: 1.0000 mg | Freq: Once | INTRAMUSCULAR | Status: DC

## 2019-11-30 MED ORDER — MEPIVACAINE HCL (PF) 2 % IJ SOLN
INTRAMUSCULAR | Status: DC | PRN
  Administered 2019-11-30: 3.5 mL via INTRATHECAL

## 2019-11-30 MED ORDER — ALUM & MAG HYDROXIDE-SIMETH 200-200-20 MG/5ML PO SUSP: 30.0000 mL | ORAL | Status: DC | PRN

## 2019-11-30 MED ORDER — ACETAMINOPHEN 160 MG/5ML PO SOLN: 325.0000 mg | ORAL | Status: DC | PRN

## 2019-11-30 MED ORDER — LISINOPRIL-HYDROCHLOROTHIAZIDE 20-12.5 MG PO TABS: 1.0000 | ORAL_TABLET | Freq: Every day | ORAL | Status: DC

## 2019-11-30 MED ORDER — FLEET ENEMA 7-19 GM/118ML RE ENEM: 1.0000 | ENEMA | Freq: Once | RECTAL | Status: DC | PRN

## 2019-11-30 MED ORDER — HYDROMORPHONE HCL 1 MG/ML IJ SOLN
0.5000 mg | INTRAMUSCULAR | Status: DC | PRN
  Administered 2019-11-30: 1 mg via INTRAVENOUS
  Filled 2019-11-30: qty 1

## 2019-11-30 MED ORDER — MIDAZOLAM HCL 5 MG/5ML IJ SOLN
INTRAMUSCULAR | Status: DC | PRN
  Administered 2019-11-30: 2 mg via INTRAVENOUS

## 2019-11-30 MED ORDER — POLYETHYLENE GLYCOL 3350 17 G PO PACK: 17.0000 g | PACK | Freq: Every day | ORAL | Status: DC | PRN

## 2019-11-30 MED ORDER — LISINOPRIL 20 MG PO TABS
20.0000 mg | ORAL_TABLET | Freq: Every day | ORAL | Status: DC
  Administered 2019-12-01: 20 mg via ORAL
  Filled 2019-11-30: qty 1

## 2019-11-30 MED ORDER — PHENYLEPHRINE HCL (PRESSORS) 10 MG/ML IV SOLN
INTRAVENOUS | Status: AC
  Filled 2019-11-30: qty 1

## 2019-11-30 MED ORDER — PANTOPRAZOLE SODIUM 40 MG PO TBEC
40.0000 mg | DELAYED_RELEASE_TABLET | Freq: Every day | ORAL | Status: DC
  Administered 2019-12-01: 10:00:00 40 mg via ORAL
  Filled 2019-11-30: qty 1

## 2019-11-30 MED ORDER — SODIUM CHLORIDE 0.45 % IV BOLUS: 500.0000 mL | INTRAVENOUS | Status: DC

## 2019-11-30 MED ORDER — ONDANSETRON HCL 4 MG PO TABS: 4.0000 mg | ORAL_TABLET | Freq: Four times a day (QID) | ORAL | Status: DC | PRN

## 2019-11-30 MED ORDER — DOCUSATE SODIUM 100 MG PO CAPS
100.0000 mg | ORAL_CAPSULE | Freq: Two times a day (BID) | ORAL | Status: DC
  Administered 2019-11-30 – 2019-12-01 (×2): 100 mg via ORAL
  Filled 2019-11-30 (×2): qty 1

## 2019-11-30 MED ORDER — ASPIRIN EC 81 MG PO TBEC: 81.0000 mg | DELAYED_RELEASE_TABLET | Freq: Two times a day (BID) | ORAL | 0 refills | Status: DC

## 2019-11-30 MED ORDER — DIPHENHYDRAMINE HCL 12.5 MG/5ML PO ELIX: 12.5000 mg | ORAL_SOLUTION | ORAL | Status: DC | PRN

## 2019-11-30 MED ORDER — BUPIVACAINE-EPINEPHRINE (PF) 0.25% -1:200000 IJ SOLN
INTRAMUSCULAR | Status: DC | PRN
  Administered 2019-11-30: 30 mL

## 2019-11-30 MED ORDER — BISACODYL 5 MG PO TBEC: 5.0000 mg | DELAYED_RELEASE_TABLET | Freq: Every day | ORAL | Status: DC | PRN

## 2019-11-30 MED ORDER — MEPERIDINE HCL 50 MG/ML IJ SOLN: 6.2500 mg | INTRAMUSCULAR | Status: DC | PRN

## 2019-11-30 MED ORDER — OXYCODONE HCL 5 MG PO TABS
5.0000 mg | ORAL_TABLET | ORAL | Status: DC | PRN
  Administered 2019-12-01 (×3): 10 mg via ORAL
  Filled 2019-11-30 (×3): qty 2

## 2019-11-30 MED ORDER — POVIDONE-IODINE 10 % EX SWAB
2.0000 "application " | Freq: Once | CUTANEOUS | Status: AC
  Administered 2019-11-30: 2 via TOPICAL

## 2019-11-30 MED ORDER — CELECOXIB 200 MG PO CAPS
200.0000 mg | ORAL_CAPSULE | Freq: Two times a day (BID) | ORAL | Status: DC
  Administered 2019-11-30 – 2019-12-01 (×2): 200 mg via ORAL
  Filled 2019-11-30 (×2): qty 1

## 2019-11-30 MED ORDER — CANAGLIFLOZIN 100 MG PO TABS
100.0000 mg | ORAL_TABLET | Freq: Every day | ORAL | Status: DC
  Administered 2019-12-01: 10:00:00 100 mg via ORAL
  Filled 2019-11-30 (×2): qty 1

## 2019-11-30 MED ORDER — MIDAZOLAM HCL 2 MG/2ML IJ SOLN
INTRAMUSCULAR | Status: AC
  Filled 2019-11-30: qty 2

## 2019-11-30 MED ORDER — PROPOFOL 10 MG/ML IV BOLUS
INTRAVENOUS | Status: AC
  Filled 2019-11-30: qty 20

## 2019-11-30 MED ORDER — FENTANYL CITRATE (PF) 100 MCG/2ML IJ SOLN
INTRAMUSCULAR | Status: AC
  Filled 2019-11-30: qty 2

## 2019-11-30 MED ORDER — BUPIVACAINE LIPOSOME 1.3 % IJ SUSP
20.0000 mL | Freq: Once | INTRAMUSCULAR | Status: DC
  Filled 2019-11-30: qty 20

## 2019-11-30 MED ORDER — ONDANSETRON HCL 4 MG/2ML IJ SOLN
INTRAMUSCULAR | Status: AC
  Filled 2019-11-30: qty 2

## 2019-11-30 MED ORDER — ACETAMINOPHEN 325 MG PO TABS: 325.0000 mg | ORAL_TABLET | Freq: Four times a day (QID) | ORAL | Status: DC | PRN

## 2019-11-30 MED ORDER — TRANEXAMIC ACID-NACL 1000-0.7 MG/100ML-% IV SOLN
1000.0000 mg | Freq: Once | INTRAVENOUS | Status: AC
  Administered 2019-12-01: 1000 mg via INTRAVENOUS
  Filled 2019-11-30: qty 100

## 2019-11-30 MED ORDER — MENTHOL 3 MG MT LOZG: 1.0000 | LOZENGE | OROMUCOSAL | Status: DC | PRN

## 2019-11-30 MED ORDER — ATORVASTATIN CALCIUM 40 MG PO TABS: 40.0000 mg | ORAL_TABLET | Freq: Every evening | ORAL | Status: DC

## 2019-11-30 MED ORDER — LIDOCAINE HCL (CARDIAC) PF 100 MG/5ML IV SOSY
PREFILLED_SYRINGE | INTRAVENOUS | Status: DC | PRN
  Administered 2019-11-30: 60 mg via INTRAVENOUS

## 2019-11-30 MED ORDER — SODIUM CHLORIDE (PF) 0.9 % IJ SOLN
INTRAMUSCULAR | Status: AC
  Filled 2019-11-30: qty 20

## 2019-11-30 MED ORDER — OXYCODONE HCL 5 MG/5ML PO SOLN: 5.0000 mg | Freq: Once | ORAL | Status: DC | PRN

## 2019-11-30 MED ORDER — PROPOFOL 10 MG/ML IV BOLUS
INTRAVENOUS | Status: DC | PRN
  Administered 2019-11-30: 30 mg via INTRAVENOUS

## 2019-11-30 MED ORDER — METOCLOPRAMIDE HCL 5 MG PO TABS: 5.0000 mg | ORAL_TABLET | Freq: Three times a day (TID) | ORAL | Status: DC | PRN

## 2019-11-30 MED ORDER — BUPIVACAINE HCL (PF) 0.25 % IJ SOLN
INTRAMUSCULAR | Status: AC
  Filled 2019-11-30: qty 30

## 2019-11-30 MED ORDER — ONDANSETRON HCL 4 MG/2ML IJ SOLN: 4.0000 mg | Freq: Four times a day (QID) | INTRAMUSCULAR | Status: DC | PRN

## 2019-11-30 MED ORDER — TRANEXAMIC ACID 1000 MG/10ML IV SOLN
INTRAVENOUS | Status: DC | PRN
  Administered 2019-11-30: 2000 mg via TOPICAL

## 2019-11-30 MED ORDER — METOCLOPRAMIDE HCL 5 MG/ML IJ SOLN: 5.0000 mg | Freq: Three times a day (TID) | INTRAMUSCULAR | Status: DC | PRN

## 2019-11-30 MED ORDER — BUPIVACAINE LIPOSOME 1.3 % IJ SUSP
INTRAMUSCULAR | Status: DC | PRN
  Administered 2019-11-30: 20 mL

## 2019-11-30 MED ORDER — PHENOL 1.4 % MT LIQD: 1.0000 | OROMUCOSAL | Status: DC | PRN

## 2019-11-30 MED ORDER — FENTANYL CITRATE (PF) 250 MCG/5ML IJ SOLN
INTRAMUSCULAR | Status: AC
  Filled 2019-11-30: qty 5

## 2019-11-30 MED ORDER — BUPIVACAINE HCL (PF) 0.25 % IJ SOLN: 30.0000 mL | Freq: Once | INTRAMUSCULAR | Status: DC

## 2019-11-30 MED ORDER — TIZANIDINE HCL 2 MG PO TABS: 2.0000 mg | ORAL_TABLET | Freq: Four times a day (QID) | ORAL | 0 refills | Status: DC | PRN

## 2019-11-30 MED ORDER — FENTANYL CITRATE (PF) 100 MCG/2ML IJ SOLN
50.0000 ug | INTRAMUSCULAR | Status: DC
  Administered 2019-11-30: 12:00:00 100 ug via INTRAVENOUS
  Filled 2019-11-30: qty 2

## 2019-11-30 MED ORDER — METHOCARBAMOL 500 MG IVPB - SIMPLE MED
500.0000 mg | Freq: Four times a day (QID) | INTRAVENOUS | Status: DC | PRN
  Administered 2019-11-30: 500 mg via INTRAVENOUS
  Filled 2019-11-30: qty 50

## 2019-11-30 MED ORDER — GABAPENTIN 100 MG PO CAPS
100.0000 mg | ORAL_CAPSULE | Freq: Three times a day (TID) | ORAL | Status: DC
  Administered 2019-11-30 – 2019-12-01 (×2): 100 mg via ORAL
  Filled 2019-11-30 (×2): qty 1

## 2019-11-30 MED ORDER — LACTATED RINGERS IV SOLN
INTRAVENOUS | Status: DC
  Administered 2019-11-30 (×2): via INTRAVENOUS

## 2019-11-30 MED ORDER — DEXAMETHASONE SODIUM PHOSPHATE 10 MG/ML IJ SOLN
INTRAMUSCULAR | Status: AC
  Filled 2019-11-30: qty 1

## 2019-11-30 MED ORDER — METFORMIN HCL 500 MG PO TABS
1000.0000 mg | ORAL_TABLET | Freq: Two times a day (BID) | ORAL | Status: DC
  Administered 2019-12-01: 1000 mg via ORAL
  Filled 2019-11-30: qty 2

## 2019-11-30 MED ORDER — ASPIRIN 81 MG PO CHEW
81.0000 mg | CHEWABLE_TABLET | Freq: Two times a day (BID) | ORAL | Status: DC
  Administered 2019-12-01: 10:00:00 81 mg via ORAL
  Filled 2019-11-30 (×2): qty 1

## 2019-11-30 MED ORDER — ONDANSETRON HCL 4 MG/2ML IJ SOLN
4.0000 mg | Freq: Once | INTRAMUSCULAR | Status: AC | PRN
  Administered 2019-11-30: 4 mg via INTRAVENOUS

## 2019-11-30 MED ORDER — LIDOCAINE 2% (20 MG/ML) 5 ML SYRINGE
INTRAMUSCULAR | Status: AC
  Filled 2019-11-30: qty 5

## 2019-11-30 MED ORDER — PROPOFOL 500 MG/50ML IV EMUL
INTRAVENOUS | Status: DC | PRN
  Administered 2019-11-30: 100 ug/kg/min via INTRAVENOUS

## 2019-11-30 MED ORDER — HYDRALAZINE HCL 20 MG/ML IJ SOLN
INTRAMUSCULAR | Status: AC
  Filled 2019-11-30: qty 1

## 2019-11-30 MED ORDER — MIDAZOLAM HCL 2 MG/2ML IJ SOLN
1.0000 mg | INTRAMUSCULAR | Status: DC
  Administered 2019-11-30: 12:00:00 2 mg via INTRAVENOUS
  Filled 2019-11-30: qty 2

## 2019-11-30 MED ORDER — OXYCODONE HCL 5 MG PO TABS: 5.0000 mg | ORAL_TABLET | Freq: Once | ORAL | Status: DC | PRN

## 2019-11-30 MED ORDER — OXYCODONE-ACETAMINOPHEN 5-325 MG PO TABS: 1.0000 | ORAL_TABLET | ORAL | 0 refills | Status: DC | PRN

## 2019-11-30 MED ORDER — HYDRALAZINE HCL 20 MG/ML IJ SOLN
5.0000 mg | Freq: Four times a day (QID) | INTRAMUSCULAR | Status: DC | PRN
  Administered 2019-11-30: 17:00:00 5 mg via INTRAVENOUS

## 2019-11-30 MED ORDER — HYDROCHLOROTHIAZIDE 12.5 MG PO CAPS
12.5000 mg | ORAL_CAPSULE | Freq: Every day | ORAL | Status: DC
  Administered 2019-12-01: 12.5 mg via ORAL
  Filled 2019-11-30: qty 1

## 2019-11-30 MED ORDER — CEFAZOLIN SODIUM-DEXTROSE 2-4 GM/100ML-% IV SOLN
2.0000 g | INTRAVENOUS | Status: AC
  Administered 2019-11-30: 2 g via INTRAVENOUS
  Filled 2019-11-30: qty 100

## 2019-11-30 MED ORDER — KCL IN DEXTROSE-NACL 20-5-0.45 MEQ/L-%-% IV SOLN
INTRAVENOUS | Status: DC
  Administered 2019-11-30 – 2019-12-01 (×2): via INTRAVENOUS
  Filled 2019-11-30 (×2): qty 1000

## 2019-11-30 MED ORDER — SODIUM CHLORIDE (PF) 0.9 % IJ SOLN
INTRAMUSCULAR | Status: DC | PRN
  Administered 2019-11-30: 70 mL

## 2019-11-30 MED ORDER — ACETAMINOPHEN 325 MG PO TABS: 325.0000 mg | ORAL_TABLET | ORAL | Status: DC | PRN

## 2019-11-30 MED ORDER — INSULIN ASPART 100 UNIT/ML ~~LOC~~ SOLN
0.0000 [IU] | Freq: Three times a day (TID) | SUBCUTANEOUS | Status: DC
  Administered 2019-12-01: 08:00:00 3 [IU] via SUBCUTANEOUS
  Administered 2019-12-01: 5 [IU] via SUBCUTANEOUS

## 2019-11-30 MED ORDER — BUPIVACAINE-EPINEPHRINE 0.25% -1:200000 IJ SOLN
INTRAMUSCULAR | Status: AC
  Filled 2019-11-30: qty 1

## 2019-11-30 MED ORDER — DEXAMETHASONE SODIUM PHOSPHATE 10 MG/ML IJ SOLN
INTRAMUSCULAR | Status: DC | PRN
  Administered 2019-11-30: 10 mg via INTRAVENOUS

## 2019-11-30 MED ORDER — METHOCARBAMOL 500 MG PO TABS
500.0000 mg | ORAL_TABLET | Freq: Four times a day (QID) | ORAL | Status: DC | PRN
  Administered 2019-12-01: 500 mg via ORAL
  Filled 2019-11-30 (×2): qty 1

## 2019-11-30 MED ORDER — BUPIVACAINE-EPINEPHRINE 0.25% -1:200000 IJ SOLN: 50.0000 mL | Freq: Once | INTRAMUSCULAR | Status: DC

## 2019-11-30 MED ORDER — TRANEXAMIC ACID-NACL 1000-0.7 MG/100ML-% IV SOLN
1000.0000 mg | INTRAVENOUS | Status: AC
  Administered 2019-11-30: 1000 mg via INTRAVENOUS
  Filled 2019-11-30: qty 100

## 2019-11-30 MED ORDER — METHOCARBAMOL 500 MG IVPB - SIMPLE MED
INTRAVENOUS | Status: AC
  Filled 2019-11-30: qty 50

## 2019-11-30 MED FILL — tiZANidine HCL 2 MG TABS: 2 | 15 days supply | Qty: 60 | Fill #0

## 2019-11-30 SURGICAL SUPPLY — 55 items
ATTUNE MED DOME PAT 41 KNEE (Knees) ×2 IMPLANT
ATTUNE MED DOME PAT 41MM KNEE (Knees) ×1 IMPLANT
ATTUNE PS FEM RT SZ 8 CEM KNEE (Femur) ×3 IMPLANT
ATTUNE PSRP INSR SZ8 7 KNEE (Insert) ×2 IMPLANT
ATTUNE PSRP INSR SZ8 7MM KNEE (Insert) ×1 IMPLANT
BAG DECANTER FOR FLEXI CONT (MISCELLANEOUS) ×3 IMPLANT
BAG ZIPLOCK 12X15 (MISCELLANEOUS) ×3 IMPLANT
BASE TIBIAL ROT PLAT SZ 10 KNE (Miscellaneous) ×1 IMPLANT
BLADE SAG 18X100X1.27 (BLADE) ×3 IMPLANT
BLADE SAW SGTL 11.0X1.19X90.0M (BLADE) ×3 IMPLANT
BLADE SURG SZ10 CARB STEEL (BLADE) ×6 IMPLANT
BNDG ELASTIC 6X10 VLCR STRL LF (GAUZE/BANDAGES/DRESSINGS) ×3 IMPLANT
BOWL SMART MIX CTS (DISPOSABLE) ×3 IMPLANT
CEMENT HV SMART SET (Cement) ×6 IMPLANT
COVER SURGICAL LIGHT HANDLE (MISCELLANEOUS) ×3 IMPLANT
COVER WAND RF STERILE (DRAPES) IMPLANT
CUFF TOURN SGL QUICK 34 (TOURNIQUET CUFF) ×2
CUFF TRNQT CYL 34X4.125X (TOURNIQUET CUFF) ×1 IMPLANT
DECANTER SPIKE VIAL GLASS SM (MISCELLANEOUS) ×9 IMPLANT
DRAPE U-SHAPE 47X51 STRL (DRAPES) ×3 IMPLANT
DRSG AQUACEL AG ADV 3.5X10 (GAUZE/BANDAGES/DRESSINGS) ×3 IMPLANT
DURAPREP 26ML APPLICATOR (WOUND CARE) ×3 IMPLANT
ELECT BLADE TIP CTD 4 INCH (ELECTRODE) ×3 IMPLANT
ELECT REM PT RETURN 15FT ADLT (MISCELLANEOUS) ×3 IMPLANT
GLOVE BIO SURGEON STRL SZ7.5 (GLOVE) ×3 IMPLANT
GLOVE BIO SURGEON STRL SZ8.5 (GLOVE) ×3 IMPLANT
GLOVE BIOGEL PI IND STRL 8 (GLOVE) ×1 IMPLANT
GLOVE BIOGEL PI IND STRL 9 (GLOVE) ×1 IMPLANT
GLOVE BIOGEL PI INDICATOR 8 (GLOVE) ×2
GLOVE BIOGEL PI INDICATOR 9 (GLOVE) ×2
GOWN STRL REUS W/TWL XL LVL3 (GOWN DISPOSABLE) ×6 IMPLANT
HANDPIECE INTERPULSE COAX TIP (DISPOSABLE) ×2
HOOD PEEL AWAY FLYTE STAYCOOL (MISCELLANEOUS) ×9 IMPLANT
KIT TURNOVER KIT A (KITS) ×3 IMPLANT
NEEDLE HYPO 21X1.5 SAFETY (NEEDLE) ×6 IMPLANT
NS IRRIG 1000ML POUR BTL (IV SOLUTION) IMPLANT
PACK ICE MAXI GEL EZY WRAP (MISCELLANEOUS) IMPLANT
PACK TOTAL KNEE CUSTOM (KITS) ×3 IMPLANT
PENCIL SMOKE EVACUATOR (MISCELLANEOUS) IMPLANT
PIN DRILL FIX HALF THREAD (BIT) ×3 IMPLANT
PIN STEINMAN FIXATION KNEE (PIN) ×3 IMPLANT
PROTECTOR NERVE ULNAR (MISCELLANEOUS) ×3 IMPLANT
SET HNDPC FAN SPRY TIP SCT (DISPOSABLE) ×1 IMPLANT
SLEEVE SURGEON STRL (DRAPES) ×3 IMPLANT
SPONGE LAP 18X18 RF (DISPOSABLE) ×3 IMPLANT
SUT VIC AB 1 CTX 36 (SUTURE) ×2
SUT VIC AB 1 CTX36XBRD ANBCTR (SUTURE) ×1 IMPLANT
SUT VIC AB 3-0 CT1 27 (SUTURE) ×6
SUT VIC AB 3-0 CT1 TAPERPNT 27 (SUTURE) ×3 IMPLANT
SYR CONTROL 10ML LL (SYRINGE) ×3 IMPLANT
TIBIAL BASE ROT PLAT SZ 10 KNE (Miscellaneous) ×3 IMPLANT
TRAY FOLEY MTR SLVR 16FR STAT (SET/KITS/TRAYS/PACK) IMPLANT
WATER STERILE IRR 1000ML POUR (IV SOLUTION) ×6 IMPLANT
WRAP KNEE MAXI GEL POST OP (GAUZE/BANDAGES/DRESSINGS) ×3 IMPLANT
YANKAUER SUCT BULB TIP 10FT TU (MISCELLANEOUS) ×3 IMPLANT

## 2019-11-30 NOTE — Anesthesia Procedure Notes (Signed)
Anesthesia Regional Block: Adductor canal block   Pre-Anesthetic Checklist: ,, timeout performed, Correct Patient, Correct Site, Correct Laterality, Correct Procedure, Correct Position, site marked, Risks and benefits discussed,  Surgical consent,  Pre-op evaluation,  At surgeon's request and post-op pain management  Laterality: Right  Prep: chloraprep       Needles:  Injection technique: Single-shot  Needle Type: Echogenic Stimulator Needle     Needle Length: 5cm  Needle Gauge: 22     Additional Needles:   Procedures:, nerve stimulator,,, ultrasound used (permanent image in chart),,,,  Narrative:  Start time: 11/30/2019 12:05 PM End time: 11/30/2019 12:10 PM Injection made incrementally with aspirations every 5 mL.  Performed by: Personally  Anesthesiologist: Janeece Riggers, MD  Additional Notes: Functioning IV was confirmed and monitors were applied.  A 68mm 22ga Arrow echogenic stimulator needle was used. Sterile prep and drape,hand hygiene and sterile gloves were used. Ultrasound guidance: relevant anatomy identified, needle position confirmed, local anesthetic spread visualized around nerve(s)., vascular puncture avoided.  Image printed for medical record. Negative aspiration and negative test dose prior to incremental administration of local anesthetic. The patient tolerated the procedure well.

## 2019-11-30 NOTE — Progress Notes (Signed)
Joanell Rising, PA contacted and was informed of pt's status.

## 2019-11-30 NOTE — Discharge Instructions (Signed)

## 2019-11-30 NOTE — Progress Notes (Signed)
AssistedDr. Oddono with right, ultrasound guided, adductor canal block. Side rails up, monitors on throughout procedure. See vital signs in flow sheet. Tolerated Procedure well.  

## 2019-11-30 NOTE — Progress Notes (Signed)
Upon being evaluated with the physical therapist, pt becomes weak and nauseated. Pt given nausea meds. Pt not feeling well at this time.

## 2019-11-30 NOTE — OR Nursing (Signed)
Pt stand and pivot up to bedside commode with 2 person assist.  Small bowel movement with urine noted in BSC.

## 2019-11-30 NOTE — Transfer of Care (Signed)
Immediate Anesthesia Transfer of Care Note  Patient: Rosaire Emerine  Procedure(s) Performed: RIGHT TOTAL KNEE ARTHROPLASTY (Right Knee)  Patient Location: PACU  Anesthesia Type:Spinal  Level of Consciousness: awake, alert , oriented and patient cooperative  Airway & Oxygen Therapy: Patient Spontanous Breathing and Patient connected to face mask oxygen  Post-op Assessment: Report given to RN and Post -op Vital signs reviewed and stable   Post vital signs: Reviewed and stable  Last Vitals:  Vitals Value Taken Time  BP    Temp    Pulse 69 11/30/19 1515  Resp 13 11/30/19 1515  SpO2 100 % 11/30/19 1515  Vitals shown include unvalidated device data.  Last Pain:  Vitals:   11/30/19 1043  TempSrc:   PainSc: 7       Patients Stated Pain Goal: 4 ([id number])  Complications: No apparent anesthesia complications

## 2019-11-30 NOTE — Anesthesia Preprocedure Evaluation (Addendum)
Anesthesia Evaluation  Patient identified by MRN, date of birth, ID band Patient awake    Reviewed: Allergy & Precautions, H&P , NPO status , Patient's Chart, lab work & pertinent test results, reviewed documented beta blocker date and time   Airway Mallampati: II  TM Distance: >3 FB Neck ROM: full    Dental no notable dental hx. (+) Edentulous Upper, Edentulous Lower   Pulmonary neg pulmonary ROS, former smoker,    Pulmonary exam normal breath sounds clear to auscultation       Cardiovascular Exercise Tolerance: Good hypertension, Pt. on medications  Rhythm:regular Rate:Normal     Neuro/Psych negative neurological ROS  negative psych ROS   GI/Hepatic Neg liver ROS, GERD  Medicated,  Endo/Other  diabetes  Renal/GU negative Renal ROS  negative genitourinary   Musculoskeletal   Abdominal   Peds  Hematology negative hematology ROS (+)   Anesthesia Other Findings   Reproductive/Obstetrics negative OB ROS                           Anesthesia Physical Anesthesia Plan  ASA: III  Anesthesia Plan: MAC and Spinal   Post-op Pain Management:  Regional for Post-op pain   Induction:   PONV Risk Score and Plan: 2  Airway Management Planned: Nasal Cannula, Simple Face Mask and Mask  Additional Equipment:   Intra-op Plan:   Post-operative Plan:   Informed Consent: I have reviewed the patients History and Physical, chart, labs and discussed the procedure including the risks, benefits and alternatives for the proposed anesthesia with the patient or authorized representative who has indicated his/her understanding and acceptance.     Dental Advisory Given  Plan Discussed with: CRNA, Anesthesiologist and Surgeon  Anesthesia Plan Comments: (  )       Anesthesia Quick Evaluation

## 2019-11-30 NOTE — Interval H&P Note (Signed)
History and Physical Interval Note:  11/30/2019 11:26 AM  Kyle Merritt  has presented today for surgery, with the diagnosis of RIGHT KNEE OSTEOARTHRITIS.  The various methods of treatment have been discussed with the patient and family. After consideration of risks, benefits and other options for treatment, the patient has consented to  Procedure(s): RIGHT TOTAL KNEE ARTHROPLASTY (Right) as a surgical intervention.  The patient's history has been reviewed, patient examined, no change in status, stable for surgery.  I have reviewed the patient's chart and labs.  Questions were answered to the patient's satisfaction.     [name]

## 2019-11-30 NOTE — OR Nursing (Signed)
Report received from Valinda Party, RN.

## 2019-11-30 NOTE — Progress Notes (Addendum)
Late note: Prior to arrival in PACU,  Dr Mayer Camel reported to RN pt will not be discharged today

## 2019-11-30 NOTE — Progress Notes (Signed)
Physical therapy on the way to evaluate the pt.

## 2019-11-30 NOTE — Telephone Encounter (Signed)
Our pharmacy does not dispense Percocet, the rx will be deleted.  Can you please send it to another pharmacy.  We dispense Tylenol #3 and Tramadol only.  Thanks!

## 2019-11-30 NOTE — Evaluation (Signed)
Physical Therapy Evaluation Patient Details Name: Kyle Merritt MRN: [id number] DOB: 1961-10-07 Today's Date: 11/30/2019   History of Present Illness  Patient is 58 y.o. male s/p Rt TKA on 11/30/19 with PMH significant for HTN, DM, OA, and colon cancer.  Clinical Impression  Kyle Merritt is a 58 y.o. male POD 0 s/p Rt TKA. Patient reports independence with mobility at baseline. Patient is now limited by functional impairments (see PT problem list below) and requires min assist for bed mobility. Patient was unable to perform transfers and gait due to onset of dizziness and nausea in sitting. His symptoms resolve with return to supine. Patient instructed in exercise to facilitate circulation. Patient will benefit from continued skilled PT interventions to address impairments and progress towards PLOF. Acute PT will follow to progress mobility and stair training in preparation for safe discharge home.    Follow Up Recommendations Follow surgeon's recommendation for DC plan and follow-up therapies    Equipment Recommendations  Rolling walker with 5" wheels    Recommendations for Other Services       Precautions / Restrictions Precautions Precautions: Fall Restrictions Weight Bearing Restrictions: No      Mobility  Bed Mobility Overal bed mobility: Needs Assistance Bed Mobility: Supine to Sit;Sit to Supine     Supine to sit: HOB elevated;Min assist Sit to supine: Min assist;HOB elevated   General bed mobility comments: min assist for LE mobility and to bring LE completely to EOB and to return LE's into bed for transfer to supine. Pt limited upon sitting up at EOB by onset of dizziness and nausea. BP has been high in PACU and remained high in sitting 157/103 with HR aroudn 98-100.  Transfers Overall transfer level: Needs assistance Equipment used: Rolling walker (2 wheeled)          General transfer comment: NT due to safety concerns secondaryu to dizziness and  nausea  Ambulation/Gait       Stairs     Wheelchair Mobility    Modified Rankin (Stroke Patients Only)       Balance Overall balance assessment: Needs assistance Sitting-balance support: Feet supported;Bilateral upper extremity supported Sitting balance-Leahy Scale: Fair            Pertinent Vitals/Pain Pain Assessment: Faces Faces Pain Scale: Hurts little more Pain Location: Rt knee; pt mostly limited by nausea Pain Descriptors / Indicators: Aching;Sore;Other (Comment)(nausea and dizziness) Pain Intervention(s): Limited activity within patient's tolerance;Monitored during session;RN gave pain meds during session(RN gave nausea meds)    Home Living Family/patient expects to be discharged to:: Private residence Living Arrangements: Spouse/significant other(pt's girlfriend and her son) Available Help at Discharge: Friend(s) Type of Home: House Home Access: Stairs to enter Entrance Stairs-Rails: Right Entrance Stairs-Number of Steps: 2 Home Layout: One level Home Equipment: Environmental consultant - 2 wheels      Prior Function Level of Independence: Independent          Hand Dominance        Extremity/Trunk Assessment   Upper Extremity Assessment Upper Extremity Assessment: Overall WFL for tasks assessed    Lower Extremity Assessment Lower Extremity Assessment: RLE deficits/detail RLE Deficits / Details: pt ael to perform LAQ with 4/5 strenght based on MMT RLE Sensation: WNL RLE Coordination: WNL    Cervical / Trunk Assessment Cervical / Trunk Assessment: Normal  Communication   Communication: No difficulties  Cognition Arousal/Alertness: Awake/alert Behavior During Therapy: WFL for tasks assessed/performed Overall Cognitive Status: Within Functional Limits for tasks assessed  General Comments: pt appeared uncomfortable and was reporting nausea      General Comments      Exercises Total Joint Exercises Ankle Circles/Pumps: AROM;10  reps;Supine;Both   Assessment/Plan    PT Assessment Patient needs continued PT services  PT Problem List Decreased strength;Decreased range of motion;Decreased knowledge of use of DME;Decreased mobility;Decreased activity tolerance;Decreased balance       PT Treatment Interventions DME instruction;Functional mobility training;Balance training;Patient/family education;Modalities;Gait training;Therapeutic activities;Stair training;Therapeutic exercise    PT Goals (Current goals can be found in the Care Plan section)  Acute Rehab PT Goals Patient Stated Goal: to not feel so sick and nauseous PT Goal Formulation: With patient Time For Goal Achievement: 12/07/19 Potential to Achieve Goals: Good    Frequency 7X/week    AM-PAC PT "6 Clicks" Mobility  Outcome Measure Help needed turning from your back to your side while in a flat bed without using bedrails?: A Little Help needed moving from lying on your back to sitting on the side of a flat bed without using bedrails?: A Little Help needed moving to and from a bed to a chair (including a wheelchair)?: A Lot Help needed standing up from a chair using your arms (e.g., wheelchair or bedside chair)?: A Lot Help needed to walk in hospital room?: A Lot Help needed climbing 3-5 steps with a railing? : A Lot 6 Click Score: 14    End of Session Equipment Utilized During Treatment: Gait belt Activity Tolerance: Treatment limited secondary to medical complications (Comment)(pt limit by nausea and dizziness, resolved with return to supine) Patient left: in bed;with call bell/phone within reach;with bed alarm set Nurse Communication: Mobility status PT Visit Diagnosis: Muscle weakness (generalized) (M62.81);Difficulty in walking, not elsewhere classified (R26.2)    Time: [id number] PT Time Calculation (min) (ACUTE ONLY): 20 min   Charges:   PT Evaluation $PT Eval Low Complexity: 1 Low         Kipp Brood, PT, DPT Physical  Therapist with [facility]  11/30/2019 7:26 PM

## 2019-11-30 NOTE — Op Note (Signed)
PATIENT ID:      Kyle Merritt  MRN:     [id number] DOB/AGE:    03/21/1961 / 58 y.o.       OPERATIVE REPORT   DATE OF PROCEDURE:  11/30/2019      PREOPERATIVE DIAGNOSIS:   RIGHT KNEE OSTEOARTHRITIS      Estimated body mass index is 32.36 kg/m as calculated from the following:   Height as of 11/26/19: 5\' 11"  (1.803 m).   Weight as of 11/26/19: 105.2 kg.                                                       POSTOPERATIVE DIAGNOSIS:   Same                                 PROCEDURE:  Procedure(s): RIGHT TOTAL KNEE ARTHROPLASTY Using DepuyAttune RP implants #8R Femur, #10Tibia, 7 mm Attune RP bearing, 41 Patella    SURGEON: [name]  ASSISTANT:   Kerry Hough. [employer]   (Present and scrubbed throughout the case, critical for assistance with exposure, retraction, instrumentation, and closure.)        ANESTHESIA: Spinl, 20cc Exparel, 50cc 0.25% Marcaine EBL: 5oo cc FLUID REPLACEMENT: 1800 cc crystaloid TOURNIQUET: DRAINS: None TRANEXAMIC ACID: 1gm IV, 2gm topical COMPLICATIONS:  None         INDICATIONS FOR PROCEDURE: The patient has  RIGHT KNEE OSTEOARTHRITIS, far Val deformities, XR shows bone on bone arthritis, lateral subluxation of tibia. Patient has failed all conservative measures including anti-inflammatory medicines, narcotics, attempts at exercise and weight loss, cortisone injections and viscosupplementation.  Risks and benefits of surgery have been discussed, questions answered.   DESCRIPTION OF PROCEDURE: The patient identified by armband, received  IV antibiotics, in the holding area at [facility]. Patient taken to the operating room, appropriate anesthetic monitors were attached, and Spinal anesthesia was  induced. IV Tranexamic acid was given.Tourniquet applied high to the operative thigh. Lateral post and foot positioner applied to the table, the lower extremity was then prepped and draped in usual sterile fashion from the toes to the tourniquet. Time-out procedure  was performed. The skin and subcutaneous tissue along the incision was injected with 20 cc of a mixture of Exparel and Marcaine solution, using a 20-gauge by 1-1/2 inch needle. We began the operation, with the knee flexed 130 degrees, by making the anterior midline incision starting at handbreadth above the patella going over the patella 1 cm medial to and 4 cm distal to the tibial tubercle. Small bleeders in the skin and the subcutaneous tissue identified and cauterized. Transverse retinaculum was incised and reflected medially and a medial parapatellar arthrotomy was accomplished. the patella was everted and theprepatellar fat pad resected. The superficial medial collateral ligament was then elevated from anterior to posterior along the proximal flare of the tibia and anterior half of the menisci resected. The knee was hyperflexed exposing bone on bone arthritis. Peripheral and notch osteophytes as well as the cruciate ligaments were then resected. We continued to work our way around posteriorly along the proximal tibia, and externally rotated the tibia subluxing it out from underneath the femur. A McHale PCL retractor was placed through the notch and a lateral Hohmann retractor placed, and we then entered  the proximal tibia in line with the Depuy starter drill in line with the axis of the tibia followed by an intramedullary guide rod and 0-degree posterior slope cutting guide. The tibial cutting guide, 4 degree posterior sloped, was pinned into place allowing resection of 10 mm of bone medially and 0 mm of bone laterally. Satisfied with the tibial resection, we then entered the distal femur 2 mm anterior to the PCL origin with the intramedullary guide rod and applied the distal femoral cutting guide set at 9 mm, with 5 degrees of valgus. This was pinned along the epicondylar axis. At this point, the distal femoral cut was accomplished without difficulty. We then sized for a #8R femoral component and pinned the  guide in 0 degrees of external rotation. The chamfer cutting guide was pinned into place. The anterior, posterior, and chamfer cuts were accomplished without difficulty followed by the Attune RP box cutting guide and the box cut. We also removed posterior osteophytes from the posterior femoral condyles. The posterior capsule was injected with Exparel solution. The knee was brought into full extension. We checked our extension gap and fit a 7 mm bearing. Distracting in extension with a lamina spreader,  bleeders in the posterior capsule, Posterior medial and posterior lateral gutter were cauterized.  The transexamic acid-soaked sponge was then placed in the gap of the knee in extension. The knee was flexed 30. The posterior patella cut was accomplished with the 9.5 mm Attune cutting guide, sized for a 55mm dome, and the fixation pegs drilled.The knee was then once again hyperflexed exposing the proximal tibia. We sized for a # 10 tibial base plate, applied the smokestack and the conical reamer followed by the the Delta fin keel punch. We then hammered into place the Attune RP trial femoral component, drilled the lugs, inserted a  7 mm trial bearing, trial patellar button, and took the knee through range of motion from 0-130 degrees. Medial and lateral ligamentous stability was checked. No thumb pressure was required for patellar Tracking. The tourniquet was not used. All trial components were removed, mating surfaces irrigated with pulse lavage, and dried with suction and sponges. 10 cc of the Exparel solution was applied to the cancellus bone of the patella distal femur and proximal tibia.  After waiting 30 seconds, the bony surfaces were again, dried with sponges. A double batch of DePuy HV cement was mixed and applied to all bony metallic mating surfaces except for the posterior condyles of the femur itself. In order, we hammered into place the tibial tray and removed excess cement, the femoral component and  removed excess cement. The final Attune RP bearing was inserted, and the knee brought to full extension with compression. The patellar button was clamped into place, and excess cement removed. The knee was held at 30 flexion with compression, while the cement cured. The wound was irrigated out with normal saline solution pulse lavage. The rest of the Exparel was injected into the parapatellar arthrotomy, subcutaneous tissues, and periosteal tissues. The parapatellar arthrotomy was closed with running #1 Vicryl suture. The subcutaneous tissue with 0 and 2-0 undyed Vicryl suture, and the skin with running 3-0 SQ vicryl. An Aquacil and Ace wrap were applied. The patient was taken to recovery room without difficulty.   [name] 11/30/2019, 7:06 AM

## 2019-11-30 NOTE — Anesthesia Procedure Notes (Signed)
Spinal  Patient location during procedure: OR Start time: 11/30/2019 12:30 PM End time: 11/30/2019 12:35 PM Staffing Anesthesiologist: Janeece Riggers, MD Preanesthetic Checklist Completed: patient identified, site marked, surgical consent, pre-op evaluation, timeout performed, IV checked, risks and benefits discussed and monitors and equipment checked Spinal Block Patient position: sitting Prep: DuraPrep Patient monitoring: heart rate, cardiac monitor, continuous pulse ox and blood pressure Approach: midline Location: L4-5 Injection technique: single-shot Needle Needle type: Sprotte  Needle gauge: 24 G Needle length: 9 cm Assessment Sensory level: T4

## 2019-12-01 ENCOUNTER — Encounter (HOSPITAL_COMMUNITY): Payer: Self-pay | Admitting: Orthopedic Surgery

## 2019-12-01 DIAGNOSIS — M1711 Unilateral primary osteoarthritis, right knee: Secondary | ICD-10-CM | POA: Diagnosis not present

## 2019-12-01 DIAGNOSIS — Z96651 Presence of right artificial knee joint: Secondary | ICD-10-CM | POA: Diagnosis not present

## 2019-12-01 LAB — CBC
HCT: 31 % — ABNORMAL LOW (ref 39.0–52.0)
Hemoglobin: 9.9 g/dL — ABNORMAL LOW (ref 13.0–17.0)
MCH: 25.8 pg — ABNORMAL LOW (ref 26.0–34.0)
MCHC: 31.9 g/dL (ref 30.0–36.0)
MCV: 80.7 fL (ref 80.0–100.0)
Platelets: 269 10*3/uL (ref 150–400)
RBC: 3.84 MIL/uL — ABNORMAL LOW (ref 4.22–5.81)
RDW: 15.4 % (ref 11.5–15.5)
WBC: 12.2 10*3/uL — ABNORMAL HIGH (ref 4.0–10.5)
nRBC: 0 % (ref 0.0–0.2)

## 2019-12-01 LAB — BASIC METABOLIC PANEL
Anion gap: 11 (ref 5–15)
BUN: 17 mg/dL (ref 6–20)
CO2: 22 mmol/L (ref 22–32)
Calcium: 8.7 mg/dL — ABNORMAL LOW (ref 8.9–10.3)
Chloride: 104 mmol/L (ref 98–111)
Creatinine, Ser: 1.16 mg/dL (ref 0.61–1.24)
GFR calc Af Amer: >60 mL/min (ref >60)
GFR calc non Af Amer: >60 mL/min (ref >60)
Glucose, Bld: 231 mg/dL — ABNORMAL HIGH (ref 70–99)
Potassium: 4.5 mmol/L (ref 3.5–5.1)
Sodium: 137 mmol/L (ref 135–145)

## 2019-12-01 LAB — HEMOGLOBIN A1C
Hgb A1c MFr Bld: 7.2 % — ABNORMAL HIGH (ref 4.8–5.6)
Mean Plasma Glucose: 159.94 mg/dL

## 2019-12-01 LAB — GLUCOSE, CAPILLARY
Glucose-Capillary: 186 mg/dL — ABNORMAL HIGH (ref 70–99)
Glucose-Capillary: 209 mg/dL — ABNORMAL HIGH (ref 70–99)

## 2019-12-01 MED FILL — ATORVASTATIN CALCIUM 40 MG: 40 | 90 days supply | Qty: 90 | Fill #2

## 2019-12-01 NOTE — Progress Notes (Signed)
RN reviewed discharge instructions with patient. All questions answered.    Paperwork given.  Prescriptions sent.     NT rolled patient down with all belongings to family car.

## 2019-12-01 NOTE — Anesthesia Postprocedure Evaluation (Signed)
Anesthesia Post Note  Patient: Kyle Merritt  Procedure(s) Performed: RIGHT TOTAL KNEE ARTHROPLASTY (Right Knee)     Patient location during evaluation: PACU Anesthesia Type: General Level of consciousness: awake and alert Pain management: pain level controlled Vital Signs Assessment: post-procedure vital signs reviewed and stable Respiratory status: spontaneous breathing, nonlabored ventilation, respiratory function stable and patient connected to nasal cannula oxygen Cardiovascular status: blood pressure returned to baseline and stable Postop Assessment: no apparent nausea or vomiting Anesthetic complications: no    Last Vitals:  Vitals:   12/01/19 0533 12/01/19 1003  BP: (!) 146/95 (!) 130/93  Pulse: 91 91  Resp: 18 18  Temp: 36.6 C 36.7 C  SpO2: 99% 100%    Last Pain:  Vitals:   12/01/19 1008  TempSrc:   PainSc: 5    Pain Goal: Patients Stated Pain Goal: 2 (12/01/19 [id number])                 Polk Minor

## 2019-12-01 NOTE — Progress Notes (Signed)
Physical Therapy Treatment Patient Details Name: Kyle Merritt MRN: [id number] DOB: 10-Jan-1961 Today's Date: 12/01/2019    History of Present Illness Patient is 58 y.o. male s/p Rt TKA on 11/30/19 with PMH significant for HTN, DM, OA, and colon cancer.    PT Comments    POD # 1 am session Pt did not D/C from PACU 2nd nausea.  Feeling better this morning.  Tolerated session well.  General bed mobility comments: demonstarted and instructred how to use a belt to self assist R LE off bed.  General transfer comment: <25% VC's on proper hand placement and safety with turn completion.  General Gait Details: <25% VC's on proper walker to self distance and safety with turns. General stair comments: 50% VC's on proper walker placement and proper sequencing.  Then returned to room to perform some TE's following HEP handout.  Instructed on proper tech, freq as well as use of ICE.   Addressed all mobility questions, discussed appropriate activity, educated on use of ICE.  Pt ready for D/C to home. Also educated on Zero block and proper LE positioning.     Follow Up Recommendations  Follow surgeon's recommendation for DC plan and follow-up therapies     Equipment Recommendations  Rolling walker with 5" wheels(delivered already in room)    Recommendations for Other Services       Precautions / Restrictions Precautions Precautions: Fall Precaution Comments: instructed "no pillow under knee" Restrictions Weight Bearing Restrictions: No Other Position/Activity Restrictions: WBAT    Mobility  Bed Mobility Overal bed mobility: Needs Assistance Bed Mobility: Supine to Sit     Supine to sit: Supervision;Min guard     General bed mobility comments: demonstarted and instructred how to use a belt to self assist R LE off bed  Transfers Overall transfer level: Needs assistance Equipment used: Rolling walker (2 wheeled) Transfers: Sit to/from [facility] Sit to Stand:  Supervision Stand pivot transfers: Supervision       General transfer comment: <25% VC's on proper hand placement and safety with turn completion  Ambulation/Gait Ambulation/Gait assistance: Supervision Gait Distance (Feet): 95 Feet Assistive device: Rolling walker (2 wheeled) Gait Pattern/deviations: Step-to pattern;Decreased stance time - right Gait velocity: decreased but functional   General Gait Details: <25% VC's on proper walker to self distance and safety with turns   Stairs Stairs: Yes Stairs assistance: Min guard;Supervision Stair Management: No rails;Step to pattern;Forwards;With walker Number of Stairs: 1 General stair comments: 50% VC's on proper walker placement and proper sequencing   Wheelchair Mobility    Modified Rankin (Stroke Patients Only)       Balance                                            Cognition Arousal/Alertness: Awake/alert Behavior During Therapy: WFL for tasks assessed/performed Overall Cognitive Status: Within Functional Limits for tasks assessed                                 General Comments: pt feeling better      Exercises   Total Knee Replacement TE's 5 reps B LE ankle pumps 5 reps towel squeezes 5 reps knee presses 5 reps heel slides  5 reps SAQ's 5 reps SLR's 5 reps ABD 5 reps LAQ's 5 reps knee bends 5 reps assisted knee  bends Followed by ICE    General Comments        Pertinent Vitals/Pain Pain Assessment: Faces Faces Pain Scale: Hurts little more Pain Location: R knee Pain Descriptors / Indicators: Aching;Discomfort;Grimacing Pain Intervention(s): Limited activity within patient's tolerance;Premedicated before session;Repositioned;Ice applied    Home Living                      Prior Function            PT Goals (current goals can now be found in the care plan section)      Frequency    7X/week      PT Plan Current plan remains appropriate     Co-evaluation              AM-PAC PT "6 Clicks" Mobility   Outcome Measure  Help needed turning from your back to your side while in a flat bed without using bedrails?: A Little Help needed moving from lying on your back to sitting on the side of a flat bed without using bedrails?: A Little Help needed moving to and from a bed to a chair (including a wheelchair)?: A Little Help needed standing up from a chair using your arms (e.g., wheelchair or bedside chair)?: A Little Help needed to walk in hospital room?: A Little Help needed climbing 3-5 steps with a railing? : A Little 6 Click Score: 18    End of Session Equipment Utilized During Treatment: Gait belt Activity Tolerance: Treatment limited secondary to medical complications (Comment) Patient left: in chair;with call bell/phone within reach Nurse Communication: Mobility status(pt ready for D/C to home after one session) PT Visit Diagnosis: Muscle weakness (generalized) (M62.81);Difficulty in walking, not elsewhere classified (R26.2)     Time: [id number] PT Time Calculation (min) (ACUTE ONLY): 30 min  Charges:  $Gait Training: 8-22 mins $Therapeutic Exercise: 8-22 mins                     Rica Koyanagi  PTA Acute  Rehabilitation Services Pager      [phone] Office      [phone]

## 2019-12-01 NOTE — Discharge Summary (Signed)
Patient ID: Kyle Merritt MRN: [id number] DOB/AGE: June 20, 1961 58 y.o.  Admit date: 11/30/2019 Discharge date: 12/01/2019  Admission Diagnoses:  Principal Problem:   Osteoarthritis of right knee Active Problems:   S/P TKR (total knee replacement), right   Discharge Diagnoses:  Same  Past Medical History:  Diagnosis Date  . Arthritis   . Colon cancer ([place])   . Diabetes mellitus without complication ([place])   . High cholesterol   . History of colon cancer   . Hypertension     Surgeries: Procedure(s): RIGHT TOTAL KNEE ARTHROPLASTY on 11/30/2019   Consultants:   Discharged Condition: Improved  Hospital Course: Kyle Merritt is an 58 y.o. male who was admitted 11/30/2019 for operative treatment ofOsteoarthritis of right knee. Patient has severe unremitting pain that affects sleep, daily activities, and work/hobbies. After pre-op clearance the patient was taken to the operating room on 11/30/2019 and underwent  Procedure(s): RIGHT TOTAL KNEE ARTHROPLASTY.    Patient was given perioperative antibiotics:  Anti-infectives (From admission, onward)   Start     Dose/Rate Route Frequency Ordered Stop   11/30/19 1015  ceFAZolin (ANCEF) IVPB 2g/100 mL premix     2 g 200 mL/hr over 30 Minutes Intravenous On call to O.R. 11/30/19 1011 11/30/19 1236       Patient was given sequential compression devices, early ambulation, and chemoprophylaxis to prevent DVT.  Patient benefited maximally from hospital stay and there were no complications.    Recent vital signs:  Patient Vitals for the past 24 hrs:  BP Temp Temp src Pulse Resp SpO2  12/01/19 1003 (!) 130/93 98 F (36.7 C) Oral 91 18 100 %  12/01/19 0533 (!) 146/95 97.8 F (36.6 C) Oral 91 18 99 %  12/01/19 0200 (!) 146/95 97.9 F (36.6 C) Oral 91 17 100 %  11/30/19 2238 140/85 98 F (36.7 C) Oral 96 17 100 %  11/30/19 2130 (!) 137/91 98.1 F (36.7 C) Oral 88 18 100 %  11/30/19 2030 (!) 144/89 98.3 F (36.8 C) - 93 20 94 %   11/30/19 1945 (!) 152/90 (!) 97.4 F (36.3 C) - 88 (!) 23 100 %  11/30/19 1930 - - - 88 (!) 24 100 %  11/30/19 1830 - - - 87 20 100 %  11/30/19 1730 (!) 168/96 - - 87 (!) 21 99 %  11/30/19 1700 (!) 157/111 - - 71 16 100 %  11/30/19 1645 (!) 159/97 - - 69 17 100 %  11/30/19 1630 (!) 156/101 - - 70 20 100 %  11/30/19 1615 (!) 154/97 - - 66 19 100 %  11/30/19 1600 (!) 145/95 - - 70 14 100 %  11/30/19 1545 (!) 137/93 - - 74 18 100 %  11/30/19 1530 116/85 - - 71 14 100 %  11/30/19 1515 114/83 (!) 97.3 F (36.3 C) - 69 13 100 %  11/30/19 1215 (!) 139/103 - - 71 12 100 %  11/30/19 1210 (!) 140/92 - - 73 12 100 %     Recent laboratory studies:  Recent Labs    12/01/19 0415  WBC 12.2*  HGB 9.9*  HCT 31.0*  PLT 269  NA 137  K 4.5  CL 104  CO2 22  BUN 17  CREATININE 1.16  GLUCOSE 231*  CALCIUM 8.7*     Discharge Medications:   Allergies as of 12/01/2019      Reactions   Crab [shellfish Allergy]    itching      Medication List  TAKE these medications   Accu-Chek Guide test strip Generic drug: glucose blood USE AS DIRECTED UP TO 4 TIMES DAILY. Notes to patient: Home regimen   aspirin EC 81 MG tablet Take 1 tablet (81 mg total) by mouth 2 (two) times daily.   atorvastatin 40 MG tablet Commonly known as: LIPITOR Take 1 tablet (40 mg total) by mouth daily. What changed: when to take this   blood glucose meter kit and supplies Kit Dispense based on patient and insurance preference. Use up to four times daily as directed. (FOR ICD-10  E11.9). Notes to patient: Home regimen   clotrimazole-betamethasone cream Commonly known as: Lotrisone Apply to both feet and between toes bid x 4 weeks. Notes to patient: Home regimen   Jardiance 10 MG Tabs tablet Generic drug: empagliflozin TAKE 1 TABLET BY MOUTH DAILY.   lisinopril-hydrochlorothiazide 20-12.5 MG tablet Commonly known as: ZESTORETIC TAKE 1 TABLET BY MOUTH DAILY.   metFORMIN 1000 MG tablet Commonly known  as: GLUCOPHAGE Take 1 tablet (1,000 mg total) by mouth 2 (two) times daily with a meal.   oxyCODONE-acetaminophen 5-325 MG tablet Commonly known as: PERCOCET/ROXICET Take 1 tablet by mouth every 4 (four) hours as needed for severe pain.   sildenafil 100 MG tablet Commonly known as: VIAGRA Take 1 tablet (100 mg total) by mouth daily as needed for erectile dysfunction.   tiZANidine 2 MG tablet Commonly known as: ZANAFLEX Take 1 tablet (2 mg total) by mouth every 6 (six) hours as needed. Notes to patient: Muscle relaxer   Trulicity 6.76 HM/0.9OB Sopn Generic drug: Dulaglutide Inject 0.75 mg into the skin once a week. What changed: when to take this Notes to patient: Please call PCP and ask when to resume this medication            Durable Medical Equipment  (From admission, onward)         Start     Ordered   11/30/19 2040  DME Walker rolling  Once    Question:  Patient needs a walker to treat with the following condition  Answer:  Status post right knee replacement   11/30/19 2039   11/30/19 2040  DME 3 n 1  Once     11/30/19 2039           Discharge Care Instructions  (From admission, onward)         Start     Ordered   12/01/19 0000  Weight bearing as tolerated     12/01/19 1204          Diagnostic Studies: Dg Chest 2 View  Result Date: 11/26/2019 CLINICAL DATA:  Preop for knee surgery. EXAM: CHEST - 2 VIEW COMPARISON:  None. FINDINGS: The heart size and mediastinal contours are within normal limits. Both lungs are clear. The visualized skeletal structures are unremarkable. IMPRESSION: No active cardiopulmonary disease. Electronically Signed   By: Marijo Conception M.D.   On: 11/26/2019 14:00    Disposition: Discharge disposition: 01-Home or Self Care       Discharge Instructions    Call MD / Call 911   Complete by: As directed    If you experience chest pain or shortness of breath, CALL 911 and be transported to the hospital emergency room.  If  you develope a fever above 101 F, pus (white drainage) or increased drainage or redness at the wound, or calf pain, call your surgeon's office.   Constipation Prevention   Complete by: As directed  Drink plenty of fluids.  Prune juice may be helpful.  You may use a stool softener, such as Colace (over the counter) 100 mg twice a day.  Use MiraLax (over the counter) for constipation as needed.   Diet - low sodium heart healthy   Complete by: As directed    Driving restrictions   Complete by: As directed    No driving for 2 weeks   Increase activity slowly as tolerated   Complete by: As directed    Patient may shower   Complete by: As directed    You may shower without a dressing once there is no drainage.  Do not wash over the wound.  If drainage remains, cover wound with plastic wrap and then shower.   Weight bearing as tolerated   Complete by: As directed       Follow-up Information    Frederik Pear, MD. Go on 12/15/2019.   Specialty: Orthopedic Surgery Why: Your appointment is scheduled to 9:45  Contact information: [address] [place] [address] [phone]        [facility] Follow up.   Specialty: Home Health Services Why: HHPT will see you at home for prior to you starting outpatient physical therapy  Contact information: [address] [place] [address] [phone]        [facility]. Go on 12/14/2019.   Why: You are scheduled to start outpatient physical therapy at the [facility] on [address] at 11:30.  Please arrive at 11:15 to complete  your paperwork            Signed: Joanell Rising 12/01/2019, 12:05 PM

## 2019-12-01 NOTE — Progress Notes (Signed)
PATIENT ID: Kyle Merritt  MRN: [id number]  DOB/AGE:  1961-10-04 / 58 y.o.  1 Day Post-Op Procedure(s) (LRB): RIGHT TOTAL KNEE ARTHROPLASTY (Right)    PROGRESS NOTE Subjective: Patient is alert, oriented, no Nausea, no Vomiting, yes passing gas. Taking PO well. Denies SOB, Chest or Calf Pain. Using Incentive Spirometer, PAS in place. Ambulate sat up dizzy, Patient reports pain as 4/10.Patient thinks dizziness was related to getting pain medications.  Objective: Vital signs in last 24 hours: Vitals:   11/30/19 2130 11/30/19 2238 12/01/19 0200 12/01/19 0533  BP: (Abnormal) 137/91 140/85 (Abnormal) 146/95 (Abnormal) 146/95  Pulse: 88 96 91 91  Resp: 18 17 17 18   Temp: 98.1 F (36.7 C) 98 F (36.7 C) 97.9 F (36.6 C) 97.8 F (36.6 C)  TempSrc: Oral Oral Oral Oral  SpO2: 100% 100% 100% 99%  Weight:      Height:          Intake/Output from previous day: I/O last 3 completed shifts: In: 2989.7 [P.O.:500; I.V.:2289.7; IV Piggyback:200] Out: [id number] [Urine:1525; Blood:150]   Intake/Output this shift: No intake/output data recorded.   LABORATORY DATA: Recent Labs    11/30/19 1014 11/30/19 1632 11/30/19 2217 12/01/19 0415  WBC  --   --   --  12.2*  HGB  --   --   --  9.9*  HCT  --   --   --  31.0*  PLT  --   --   --  269  NA  --   --   --  137  K  --   --   --  4.5  CL  --   --   --  104  CO2  --   --   --  22  BUN  --   --   --  17  CREATININE  --   --   --  1.16  GLUCOSE  --   --   --  231*  GLUCAP 134* 120* 221*  --   CALCIUM  --   --   --  8.7*    Examination: Neurologically intact ABD soft Neurovascular intact Sensation intact distally Intact pulses distally Dorsiflexion/Plantar flexion intact Incision: dressing C/D/I No cellulitis present Compartment soft}  Assessment:   1 Day Post-Op Procedure(s) (LRB): RIGHT TOTAL KNEE ARTHROPLASTY (Right) ADDITIONAL DIAGNOSIS: Expected Acute Blood Loss Anemia, Diabetes and Hypertension  Patient's anticipated LOS is  less than 2 midnights, meeting these requirements: - Younger than 3 - Lives within 1 hour of care - Has a competent adult at home to recover with post-op recover - NO history of  - Chronic pain requiring opiods  - Diabetes  - Coronary Artery Disease  - Heart failure  - Heart attack  - Stroke  - DVT/VTE  - Cardiac arrhythmia  - Respiratory Failure/COPD  - Renal failure  - Anemia  - Advanced Liver disease       Plan: PT/OT WBAT, AROM and PROM  DVT Prophylaxis:  SCDx72hrs, ASA 81 mg BID x 2 weeks DISCHARGE PLAN: Home, today if passes PT DISCHARGE NEEDS: HHPT, Walker and 3-in-1 comode seat     Kyle Merritt 12/01/2019, 7:37 AM Patient ID: Kyle Merritt, male   DOB: May 23, 1961, 58 y.o.   MRN: [id number]

## 2019-12-02 DIAGNOSIS — Z471 Aftercare following joint replacement surgery: Secondary | ICD-10-CM | POA: Diagnosis not present

## 2019-12-08 DIAGNOSIS — Z471 Aftercare following joint replacement surgery: Secondary | ICD-10-CM | POA: Diagnosis not present

## 2019-12-10 DIAGNOSIS — Z471 Aftercare following joint replacement surgery: Secondary | ICD-10-CM | POA: Diagnosis not present

## 2019-12-11 DIAGNOSIS — Z471 Aftercare following joint replacement surgery: Secondary | ICD-10-CM | POA: Diagnosis not present

## 2019-12-14 ENCOUNTER — Encounter: Payer: Self-pay | Admitting: Physical Therapy

## 2019-12-14 ENCOUNTER — Ambulatory Visit: Payer: Medicaid Other | Attending: Orthopedic Surgery | Admitting: Physical Therapy

## 2019-12-14 ENCOUNTER — Other Ambulatory Visit: Payer: Self-pay

## 2019-12-14 DIAGNOSIS — R2689 Other abnormalities of gait and mobility: Secondary | ICD-10-CM | POA: Insufficient documentation

## 2019-12-14 DIAGNOSIS — R600 Localized edema: Secondary | ICD-10-CM | POA: Insufficient documentation

## 2019-12-14 DIAGNOSIS — M25561 Pain in right knee: Secondary | ICD-10-CM | POA: Diagnosis not present

## 2019-12-14 DIAGNOSIS — M25661 Stiffness of right knee, not elsewhere classified: Secondary | ICD-10-CM | POA: Insufficient documentation

## 2019-12-14 DIAGNOSIS — M6281 Muscle weakness (generalized): Secondary | ICD-10-CM | POA: Insufficient documentation

## 2019-12-14 NOTE — Patient Instructions (Signed)
Access Code: [email or web address]  URL: [email or web address]  Date: 12/14/2019  Prepared by: Jeral Pinch   Exercises Seated Knee Flexion Extension AAROM with Overpressure - 10 reps - 5-10 hold - 3x daily Seated Long Arc Quad - 10 reps - 10 hold - 3x daily Standing Heel Raise with Support - 10 reps - 2 sets - 1-2 hold - 3x daily Standing Single Leg Stance with Counter Support - 10 reps - 3 sets - 1x daily

## 2019-12-14 NOTE — Therapy (Signed)
[place], [place], [address] Phone: [phone]   Fax:  [phone]  Physical Therapy Evaluation  Patient Details  Name: Kyle Merritt MRN: [id number] Date of Birth: 1961/12/17 Referring Provider (PT): Dr Frederik Pear   Encounter Date: 12/14/2019  PT End of Session - 12/14/19 1129    Visit Number  1    Authorization Type  MCD    PT Start Time  1129       Past Medical History:  Diagnosis Date  . Arthritis   . Colon cancer ([place])   . Diabetes mellitus without complication ([place])   . High cholesterol   . History of colon cancer   . Hypertension     Past Surgical History:  Procedure Laterality Date  . COLON SURGERY    . KNEE ARTHROSCOPY Bilateral   . 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 TRIGGER FINGER RELEASE Right 05/22/2019    There were no vitals filed for this visit.   Subjective Assessment - 12/14/19 1130    Subjective  Pt reports progressive Rt knee issues and had elective TKA 2 wks ago.  Currently he is working on bending the knee and doing lifts    Pertinent History  being driven by his girlfriend    How long can you sit comfortably?  1-2 min    How long can you walk comfortably?  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  Yes    Pain Score  3     Pain Location  Knee    Pain Orientation  Right    Pain Descriptors / Indicators  Aching;Sore    Pain Type  Surgical pain    Pain Onset  1 to 4 weeks ago    Pain Frequency  Constant    Aggravating Factors   different positions and bending    Pain Relieving Factors  lying with it straight out         [facility] PT Assessment - 12/14/19 0001      Assessment   Medical Diagnosis  Rt TKA    Referring Provider (PT)  Dr Frederik Pear     Onset Date/Surgical Date  11/30/19    Hand Dominance  Left    Next MD Visit  12/01/2019    Prior Therapy  HHPT for 3 visits      Precautions   Precaution Comments  TKA      Balance Screen   Has the patient fallen in the past 6 months  No    Has the patient had a decrease in activity level because of a fear of falling?   No    Is the patient reluctant to leave their home because of a fear of falling?   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    Leisure  nothing particular      Observation/Other Assessments  Other Surveys   Lower Extremity Functional Scale    Lower Extremity Functional Scale   11.3%      Observation/Other Assessments-Edema    Edema  Circumferential      Circumferential Edema   Circumferential - Right  51 cm    Circumferential - Left   42.5cm      Posture/Postural Control   Posture/Postural Control  Postural limitations    Postural Limitations  Flexed trunk   at hips     ROM / Strength   AROM / PROM / Strength  AROM;PROM;Strength      AROM   AROM Assessment Site  Knee    Right/Left Knee  Left;Right    Right Knee Extension  -3    Right Knee Flexion  90    Left Knee Extension  0    Left Knee Flexion  125      PROM   PROM Assessment Site  Knee    Right/Left Knee  Right    Right Knee Flexion  100      Strength   Strength Assessment Site  Hip;Knee;Ankle    Right/Left Hip  Right   Lt WNL   Right Hip Flexion  4/5   (+) quad lag   Right Hip ABduction  4/5    Right/Left Knee  Right   Lt WNL   Right Knee Flexion  3+/5    Right Knee Extension  4-/5    Right/Left Ankle  --   5/5     Transfers   Comments  intermittently has to assist Rt LE up to bed with left                 Objective measurements completed on examination: See above findings.      [place] Adult PT Treatment/Exercise - 12/14/19 0001      Exercises   Exercises  Knee/Hip      Knee/Hip Exercises: Aerobic   Nustep  5' L5 UE/LE for knee ROM      Knee/Hip Exercises: Standing   Heel Raises  Both;10 reps   UE assist      Knee/Hip Exercises: Seated   Other Seated Knee/Hip Exercises  self knee flexion stretching      Modalities   Modalities  Vasopneumatic      Vasopneumatic   Number Minutes Vasopneumatic   15 minutes    Vasopnuematic Location   Knee    Vasopneumatic Pressure  Medium    Vasopneumatic Temperature   3*             PT Education - 12/14/19 1202    Education Details  HEP and POC    Person(s) Educated  Patient    Methods  Explanation;Demonstration;Handout    Comprehension  Returned demonstration;Verbalized understanding       PT Short Term Goals - 12/14/19 1124      PT SHORT TERM GOAL #1   Title  I with initial HEP for knee ROM and gait ( 01/04/2020) )    Baseline  only doing light exercise for knee ROM    Time  3    Period  Weeks    Status  New    Target Date  01/04/20      PT SHORT TERM GOAL #2   Title  demo Rt knee flexion =/> 110 degrees to allow ease with stepping up a stair ( 01/04/2020)    Baseline  90 degrees - not doing stairs    Time  3  Period  Weeks    Status  New    Target Date  01/04/20      PT SHORT TERM GOAL #3   Title  demo strong contraction of Rt quad to assist with standing stabilizaiton ( 01/04/2020)    Baseline  poor quad contraction with quad lag with SLR    Time  3    Period  Weeks    Status  New    Target Date  01/04/20      PT SHORT TERM GOAL #4   Title  ambulate iwth least restrictive AD and upright posture ( 01/04/2020)    Baseline  walking with single AC and flexed posture,    Time  3    Period  Weeks    Status  New    Target Date  01/04/20        PT Long Term Goals - 12/14/19 1206      PT  LONG TERM GOAL #1   Title  to be established after first three visits      PT LONG TERM GOAL #2   Title  -----      PT LONG TERM GOAL #3   Title  ------      PT LONG TERM GOAL #4   Title  ------             Plan - 12/14/19 1209    Clinical Impression Statement  58 yo male presents 2 wks s/p elective Rt TKA.  He needs UE assist for transfers and to assist Rt LE at times.  Ambulates with forward flexed posture and lateral lean using single AC - he was instruted by HHPT. He has limited knee ROM, LE weakness, gait abnormalities, a lot of swelling in the knee joint and decreased proprioceptin.  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  He would benefit from PT to restore his PLOF and address the defecits.    Personal Factors and Comorbidities  Comorbidity 3+    Comorbidities  DM, HTN, arthritis, h/o neck issues.    Examination-Activity Limitations  Bathing;Bed Mobility;Sit;Sleep;Squat;Hygiene/Grooming;Lift;Other    Stability/Clinical Decision Making  Stable/Uncomplicated    Clinical Decision Making  Low    Rehab Potential  Excellent    PT Frequency  3x / week    PT Treatment/Interventions  Iontophoresis 4mg /ml Dexamethasone;Gait training;Taping;Vasopneumatic Device;Patient/family education;Functional mobility training;Moist Heat;Ultrasound;Passive range of motion;Therapeutic exercise;Cryotherapy;Scientist, product/process development;Neuromuscular re-education;Manual techniques    PT Next Visit Plan  progress Rt knee ROM, quad contraction/LE strength and mobility - will need reassessment to get more visits for second wk of Jan    Consulted and Agree with Plan of Care  Patient       Patient will benefit from skilled therapeutic intervention in order to improve the following deficits and impairments:  Abnormal gait, Decreased range of motion, Pain, Decreased activity tolerance, Decreased balance, Decreased knowledge of use of DME, Impaired flexibility, Increased edema, Decreased  strength, Decreased mobility  Visit Diagnosis: Stiffness of right knee, not elsewhere classified - Plan: PT plan of care cert/re-cert  Muscle weakness (generalized) - Plan: PT plan of care cert/re-cert  Other abnormalities of gait and mobility - Plan: PT plan of care cert/re-cert  Localized edema - Plan: PT plan of care cert/re-cert  Acute pain of right knee - Plan: PT plan of care cert/re-cert     Problem List Patient Active Problem List   Diagnosis Date Noted  . S/P TKR (total knee replacement), right 11/30/2019  .  Osteoarthritis of right knee 11/26/2019  . Constipation 10/06/2018  . Olecranon bursitis of left elbow 06/04/2018  . Trigger finger, acquired 06/04/2018  . Cervical disc disorder with radiculopathy of cervical region 05/08/2018  . Other chronic pain 02/04/2018  . History of cocaine use 02/04/2018  . Hyperlipidemia 02/04/2018  . Colon cancer screening 11/29/2014  . Knee pain, acute 10/04/2014  . Essential hypertension 08/02/2014  . Dental abscess 08/02/2014  . Diabetes ([place]) 04/29/2014  . HTN (hypertension) 04/29/2014  . Dyslipidemia 04/29/2014  . History of colon cancer 04/29/2014  . Back pain 04/29/2014  . Erectile dysfunction 04/29/2014  . Tobacco abuse 07/27/2013  . PURE HYPERCHOLESTEROLEMIA 12/31/2008  . TESTOSTERONE DEFICIENCY 12/10/2008  . GERD 12/08/2008  . ONYCHOMYCOSIS, BILATERAL 11/09/2008  . DIABETES MELLITUS 11/09/2008  . Anemia 11/09/2008  . Malignant neoplasm of colon ([place]) 12/24/2002    Boneta Lucks rPT  12/14/2019, 12:19 PM  [facility] [address] [place], [place], [address] Phone: [phone]   Fax:  [phone]  Name: Drey Goodman MRN: [id number] Date of Birth: December 05, 1961

## 2019-12-15 DIAGNOSIS — M1711 Unilateral primary osteoarthritis, right knee: Secondary | ICD-10-CM | POA: Diagnosis not present

## 2019-12-15 DIAGNOSIS — Z96651 Presence of right artificial knee joint: Secondary | ICD-10-CM | POA: Diagnosis not present

## 2019-12-28 ENCOUNTER — Encounter: Payer: Self-pay | Admitting: Physical Therapy

## 2019-12-28 ENCOUNTER — Other Ambulatory Visit: Payer: Self-pay

## 2019-12-28 ENCOUNTER — Ambulatory Visit: Payer: Medicaid Other | Attending: Internal Medicine | Admitting: Physical Therapy

## 2019-12-28 DIAGNOSIS — M6281 Muscle weakness (generalized): Secondary | ICD-10-CM

## 2019-12-28 DIAGNOSIS — M25661 Stiffness of right knee, not elsewhere classified: Secondary | ICD-10-CM | POA: Diagnosis not present

## 2019-12-28 DIAGNOSIS — M25561 Pain in right knee: Secondary | ICD-10-CM | POA: Diagnosis not present

## 2019-12-28 DIAGNOSIS — R2689 Other abnormalities of gait and mobility: Secondary | ICD-10-CM | POA: Diagnosis not present

## 2019-12-28 DIAGNOSIS — R600 Localized edema: Secondary | ICD-10-CM | POA: Diagnosis not present

## 2019-12-28 MED FILL — LISINOPRIL-HCTZ 20-12.5 MG: 20-12.5 | 30 days supply | Qty: 30 | Fill #1

## 2019-12-28 NOTE — Therapy (Signed)
[place], [place], [address] Phone: [phone]   Fax:  [phone]  Physical Therapy Treatment  Patient Details  Name: Kyle Merritt MRN: [id number] Date of Birth: 04-05-1961 Referring Provider (PT): Dr Frederik Pear   Encounter Date: 12/28/2019  PT End of Session - 12/28/19 1151    Visit Number  2    Number of Visits  4    Date for PT Re-Evaluation  01/04/20    Authorization Type  MCD 3 visits    Authorization Time Period  12/28/2019-01/10/2020    Authorization - Visit Number  1    Authorization - Number of Visits  3    PT Start Time  [id number]    PT Stop Time  [id number]    PT Time Calculation (min)  50 min       Past Medical History:  Diagnosis Date  . Arthritis   . Colon cancer ([place])   . Diabetes mellitus without complication ([place])   . High cholesterol   . History of colon cancer   . Hypertension     Past Surgical History:  Procedure Laterality Date  . COLON SURGERY    . KNEE ARTHROSCOPY Bilateral   . 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 TRIGGER FINGER RELEASE Right 05/22/2019    There were no vitals filed for this visit.  Subjective Assessment - 12/28/19 1148    Subjective  Doing well until Friday morning when my right ankle swelled and is painful to walk on. Also, my leftr big toe is swollen too. It might be gout. It runs in the family. I ate Seafood which has previoulsy made me swell.,    Currently in Pain?  Yes    Pain Score  3     Pain Location  Ankle    Pain Orientation  Right    Pain Descriptors / Indicators  Sore    Aggravating Factors   ambulation    Pain Relieving Factors  rest         [facility] PT Assessment - 12/28/19 0001      AROM   Right Knee Extension  -3   quad lag    Right Knee Flexion  104                   [facility] Adult PT  Treatment/Exercise - 12/28/19 0001      Knee/Hip Exercises: Stretches   Active Hamstring Stretch Limitations  EOM       Knee/Hip Exercises: Aerobic   Nustep  5' L5 UE/LE for knee ROM      Knee/Hip Exercises: Standing   Heel Raises  Both;10 reps   UE assist    Functional Squat  10 reps    Other Standing Knee Exercises  Marching at counter top       Knee/Hip Exercises: Seated   Long Arc Quad  20 reps    Other Seated Knee/Hip Exercises  self knee flexion stretching      Knee/Hip Exercises: Supine   Quad Sets  20 reps    Heel Slides  15 reps    Straight Leg Raises  10 reps;2 sets      Knee/Hip Exercises: Sidelying   Hip ABduction  10 reps               PT Short Term Goals -  12/14/19 1124      PT SHORT TERM GOAL #1   Title  I with initial HEP for knee ROM and gait ( 01/04/2020) )    Baseline  only doing light exercise for knee ROM    Time  3    Period  Weeks    Status  New    Target Date  01/04/20      PT SHORT TERM GOAL #2   Title  demo Rt knee flexion =/> 110 degrees to allow ease with stepping up a stair ( 01/04/2020)    Baseline  90 degrees - not doing stairs    Time  3    Period  Weeks    Status  New    Target Date  01/04/20      PT SHORT TERM GOAL #3   Title  demo strong contraction of Rt quad to assist with standing stabilizaiton ( 01/04/2020)    Baseline  poor quad contraction with quad lag with SLR    Time  3    Period  Weeks    Status  New    Target Date  01/04/20      PT SHORT TERM GOAL #4   Title  ambulate iwth least restrictive AD and upright posture ( 01/04/2020)    Baseline  walking with single AC and flexed posture,    Time  3    Period  Weeks    Status  New    Target Date  01/04/20        PT Long Term Goals - 12/14/19 1206      PT LONG TERM GOAL #1   Title  to be established after first three visits      PT LONG TERM GOAL #2   Title  -----      PT LONG TERM GOAL #3   Title  ------      PT LONG TERM GOAL #4   Title  ------             Plan - 12/28/19 1201    Clinical Impression Statement  Pt arrives reporting swollen ankle on right after eating seafood over the holiday. He reports also that is left great toe has swollen as well. He reports this has happened in the past with eating seafood. No redness, warmth or pain in calf. Pt was advised of signs/ symptoms of DVT and instructed to seek medical attention if his symptoms change. Good imorovement in knee flexion. He has full passive extension and a slight quad lag. Continued with ROM and LE strength, Vaso at end of session for edema.    PT Next Visit Plan  progress Rt knee ROM, quad contraction/LE strength and mobility - will need reassessment to get more visits for second wk of Jan    PT Home Exercise Plan  Accesss Code At [facility]: QS, SLR, seated heel slide, LAQ, standing mini squats       Patient will benefit from skilled therapeutic intervention in order to improve the following deficits and impairments:  Abnormal gait, Decreased range of motion, Pain, Decreased activity tolerance, Decreased balance, Decreased knowledge of use of DME, Impaired flexibility, Increased edema, Decreased strength, Decreased mobility  Visit Diagnosis: Stiffness of right knee, not elsewhere classified  Muscle weakness (generalized)  Other abnormalities of gait and mobility  Localized edema     Problem List Patient Active Problem List   Diagnosis Date Noted  . S/P TKR (total knee replacement), right 11/30/2019  .  Osteoarthritis of right knee 11/26/2019  . Constipation 10/06/2018  . Olecranon bursitis of left elbow 06/04/2018  . Trigger finger, acquired 06/04/2018  . Cervical disc disorder with radiculopathy of cervical region 05/08/2018  . Other chronic pain 02/04/2018  . History of cocaine use 02/04/2018  . Hyperlipidemia 02/04/2018  . Colon cancer screening 11/29/2014  . Knee pain, acute 10/04/2014  . Essential hypertension 08/02/2014  . Dental abscess  08/02/2014  . Diabetes ([place]) 04/29/2014  . HTN (hypertension) 04/29/2014  . Dyslipidemia 04/29/2014  . History of colon cancer 04/29/2014  . Back pain 04/29/2014  . Erectile dysfunction 04/29/2014  . Tobacco abuse 07/27/2013  . PURE HYPERCHOLESTEROLEMIA 12/31/2008  . TESTOSTERONE DEFICIENCY 12/10/2008  . GERD 12/08/2008  . ONYCHOMYCOSIS, BILATERAL 11/09/2008  . DIABETES MELLITUS 11/09/2008  . Anemia 11/09/2008  . Malignant neoplasm of colon [facility]) 12/24/2002    Dorene Ar, PTA 12/28/2019, 12:36 PM  [facility] [address] [place], [place], [address] Phone: [phone]   Fax:  [phone]  Name: Kyle Merritt MRN: [id number] Date of Birth: [birth year]

## 2019-12-30 ENCOUNTER — Encounter: Payer: Self-pay | Admitting: Physical Therapy

## 2019-12-30 ENCOUNTER — Other Ambulatory Visit: Payer: Self-pay

## 2019-12-30 ENCOUNTER — Ambulatory Visit: Payer: Medicaid Other | Admitting: Physical Therapy

## 2019-12-30 DIAGNOSIS — M25661 Stiffness of right knee, not elsewhere classified: Secondary | ICD-10-CM | POA: Diagnosis not present

## 2019-12-30 DIAGNOSIS — R2689 Other abnormalities of gait and mobility: Secondary | ICD-10-CM

## 2019-12-30 DIAGNOSIS — M25561 Pain in right knee: Secondary | ICD-10-CM

## 2019-12-30 DIAGNOSIS — M6281 Muscle weakness (generalized): Secondary | ICD-10-CM | POA: Diagnosis not present

## 2019-12-30 DIAGNOSIS — R600 Localized edema: Secondary | ICD-10-CM | POA: Diagnosis not present

## 2019-12-30 NOTE — Therapy (Signed)
[place], [place], [address] Phone: [phone]   Fax:  [phone]  Physical Therapy Treatment  Patient Details  Name: Kyle Merritt MRN: [id number] Date of Birth: [birth year] Referring Provider (PT): Dr Frederik Pear   Encounter Date: 12/30/2019  PT End of Session - 12/30/19 1021    Visit Number  3    Number of Visits  4    Date for PT Re-Evaluation  01/04/20    Authorization Type  MCD 3 visits    Authorization Time Period  12/28/2019-01/10/2020    Authorization - Visit Number  2    Authorization - Number of Visits  3    PT Start Time  1016    PT Stop Time  1115    PT Time Calculation (min)  59 min       Past Medical History:  Diagnosis Date  . Arthritis   . Colon cancer ([place])   . Diabetes mellitus without complication ([place])   . High cholesterol   . History of colon cancer   . Hypertension     Past Surgical History:  Procedure Laterality Date  . COLON SURGERY    . KNEE ARTHROSCOPY Bilateral   . 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 TRIGGER FINGER RELEASE Right 05/22/2019    There were no vitals filed for this visit.  Subjective Assessment - 12/30/19 1020    Subjective  Ankle swelling went down and ankle feels better. The left toe is till swollen.    Currently in Pain?  No/denies    Pain Score  0-No pain    Pain Location  Knee    Pain Orientation  Right                       [facility] Adult PT Treatment/Exercise - 12/30/19 0001      Knee/Hip Exercises: Stretches   Active Hamstring Stretch Limitations  --      Knee/Hip Exercises: Aerobic   Nustep  5' L5 UE/LE for knee ROM      Knee/Hip Exercises: Standing   Heel Raises  Both;10 reps   UE assist    Hip Abduction  10 reps    Abduction Limitations  each side , needs bilat UE     Forward  Step Up  1 set;15 reps;Hand Hold: 1;Step Height: 4"    Functional Squat  10 reps    Other Standing Knee Exercises  tandem stance     Other Standing Knee Exercises  Marching in parallel bars       Knee/Hip Exercises: Seated   Long Arc Quad  10 reps;2 sets    Long Arc Quad Weight  3 lbs.    Other Seated Knee/Hip Exercises  self knee flexion stretching    Other Seated Knee/Hip Exercises  scoot to edge x 2     Marching  10 reps    Marching Limitations  right , 3#     Sit to Sand  10 reps   needs UE to rise, cues for controlled descent      Knee/Hip Exercises: Supine   Short Arc Quad Sets  10 reps;2 sets    Short Arc Quad Sets Limitations  3#    Bridges  10 reps    Straight Leg Raises  10 reps;2 sets  Knee/Hip Exercises: Sidelying   Hip ABduction  10 reps      Vasopneumatic   Number Minutes Vasopneumatic   15 minutes    Vasopnuematic Location   Knee    Vasopneumatic Pressure  Medium    Vasopneumatic Temperature   3*               PT Short Term Goals - 12/14/19 1124      PT SHORT TERM GOAL #1   Title  I with initial HEP for knee ROM and gait ( 01/04/2020) )    Baseline  only doing light exercise for knee ROM    Time  3    Period  Weeks    Status  New    Target Date  01/04/20      PT SHORT TERM GOAL #2   Title  demo Rt knee flexion =/> 110 degrees to allow ease with stepping up a stair ( 01/04/2020)    Baseline  90 degrees - not doing stairs    Time  3    Period  Weeks    Status  New    Target Date  01/04/20      PT SHORT TERM GOAL #3   Title  demo strong contraction of Rt quad to assist with standing stabilizaiton ( 01/04/2020)    Baseline  poor quad contraction with quad lag with SLR    Time  3    Period  Weeks    Status  New    Target Date  01/04/20      PT SHORT TERM GOAL #4   Title  ambulate iwth least restrictive AD and upright posture ( 01/04/2020)    Baseline  walking with single AC and flexed posture,    Time  3    Period  Weeks    Status  New     Target Date  01/04/20        PT Long Term Goals - 12/14/19 1206      PT LONG TERM GOAL #1   Title  to be established after first three visits      PT LONG TERM GOAL #2   Title  -----      PT LONG TERM GOAL #3   Title  ------      PT LONG TERM GOAL #4   Title  ------            Plan - 12/30/19 1102    Clinical Impression Statement  Pt arrives reporting decreased ankle swelling and no pain in right ankle or knee today. Increased closed chain challenges with frequent rest breaks due to SOB. Increased UE assist needed with closed chain. Vaso repeated at end of session for edema.    PT Next Visit Plan  progress Rt knee ROM, quad contraction/LE strength and mobility - assess and request additional medicad vists- needs to schedule    PT Home Exercise Plan  Accesss Code At [facility]: QS, SLR, seated heel slide, LAQ, standing mini squats       Patient will benefit from skilled therapeutic intervention in order to improve the following deficits and impairments:  Abnormal gait, Decreased range of motion, Pain, Decreased activity tolerance, Decreased balance, Decreased knowledge of use of DME, Impaired flexibility, Increased edema, Decreased strength, Decreased mobility  Visit Diagnosis: Stiffness of right knee, not elsewhere classified  Muscle weakness (generalized)  Other abnormalities of gait and mobility  Localized edema  Acute pain of right knee  Problem List Patient Active Problem List   Diagnosis Date Noted  . S/P TKR (total knee replacement), right 11/30/2019  . Osteoarthritis of right knee 11/26/2019  . Constipation 10/06/2018  . Olecranon bursitis of left elbow 06/04/2018  . Trigger finger, acquired 06/04/2018  . Cervical disc disorder with radiculopathy of cervical region 05/08/2018  . Other chronic pain 02/04/2018  . History of cocaine use 02/04/2018  . Hyperlipidemia 02/04/2018  . Colon cancer screening 11/29/2014  . Knee pain, acute  10/04/2014  . Essential hypertension 08/02/2014  . Dental abscess 08/02/2014  . Diabetes ([place]) 04/29/2014  . HTN (hypertension) 04/29/2014  . Dyslipidemia 04/29/2014  . History of colon cancer 04/29/2014  . Back pain 04/29/2014  . Erectile dysfunction 04/29/2014  . Tobacco abuse 07/27/2013  . PURE HYPERCHOLESTEROLEMIA 12/31/2008  . TESTOSTERONE DEFICIENCY 12/10/2008  . GERD 12/08/2008  . ONYCHOMYCOSIS, BILATERAL 11/09/2008  . DIABETES MELLITUS 11/09/2008  . Anemia 11/09/2008  . Malignant neoplasm of colon [facility]) 12/24/2002    Dorene Ar, PTA 12/30/2019, 11:05 AM  [facility] [address] [place], [place], [address] Phone: [phone]   Fax:  [phone]  Name: Palash Douse MRN: [id number] Date of Birth: September 01, 1961

## 2020-01-01 ENCOUNTER — Ambulatory Visit: Payer: Medicaid Other | Admitting: Physical Therapy

## 2020-01-06 ENCOUNTER — Encounter: Payer: Self-pay | Admitting: Nurse Practitioner

## 2020-01-06 ENCOUNTER — Ambulatory Visit (INDEPENDENT_AMBULATORY_CARE_PROVIDER_SITE_OTHER): Payer: Medicaid Other | Admitting: Nurse Practitioner

## 2020-01-06 ENCOUNTER — Other Ambulatory Visit: Payer: Self-pay

## 2020-01-06 VITALS — BP 128/48 | HR 110 | Temp 97.8°F | Resp 16 | Ht 69.0 in | Wt 204.0 lb

## 2020-01-06 DIAGNOSIS — M25472 Effusion, left ankle: Secondary | ICD-10-CM | POA: Diagnosis not present

## 2020-01-06 DIAGNOSIS — E1169 Type 2 diabetes mellitus with other specified complication: Secondary | ICD-10-CM | POA: Diagnosis not present

## 2020-01-06 DIAGNOSIS — E782 Mixed hyperlipidemia: Secondary | ICD-10-CM | POA: Diagnosis not present

## 2020-01-06 DIAGNOSIS — M25572 Pain in left ankle and joints of left foot: Secondary | ICD-10-CM

## 2020-01-06 DIAGNOSIS — R5383 Other fatigue: Secondary | ICD-10-CM

## 2020-01-06 DIAGNOSIS — I10 Essential (primary) hypertension: Secondary | ICD-10-CM

## 2020-01-06 DIAGNOSIS — R634 Abnormal weight loss: Secondary | ICD-10-CM | POA: Diagnosis not present

## 2020-01-06 LAB — POCT GLYCOSYLATED HEMOGLOBIN (HGB A1C): Hemoglobin A1C: 6.6 % — AB (ref 4.0–5.6)

## 2020-01-06 LAB — POCT URINALYSIS DIPSTICK
Bilirubin, UA: NEGATIVE
Glucose, UA: POSITIVE — AB
Ketones, UA: NEGATIVE
Nitrite, UA: NEGATIVE
Protein, UA: NEGATIVE
Spec Grav, UA: 1.015 (ref 1.010–1.025)
Urobilinogen, UA: 0.2 E.U./dL
pH, UA: 5 (ref 5.0–8.0)

## 2020-01-06 LAB — GLUCOSE, POCT (MANUAL RESULT ENTRY): POC Glucose: 149 mg/dl — AB (ref 70–99)

## 2020-01-06 NOTE — Progress Notes (Signed)
Established Patient Office Visit  Subjective:  Patient ID: Kyle Merritt, male    DOB: 01-24-1961  Age: 59 y.o. MRN: [id number]  CC:  Chief Complaint  Patient presents with  . Ankle Pain    pain and swelling in left ankle   . Diabetes  . Hypertension  . Hyperlipidemia  . Fatigue    HPI Kyle Merritt presents for follow up.  He is status post right total knee arthroscopy on 11/30/2019. He was doing well until he left foot swollen. He is in physical therapy but admits that he stopped with the left foot pain. He admits that he energy level is down. He denies a history of anemia. He denies any bleeding. He has a decrease appetite. He has lost about 20 pound in one month. He denies any chest pain or shortness of breath. He does get fatigued with walking. He is having numbness and tingling with hands and feet. He denies any nausea or vomiting. He feels like he when he is in pain he is not able to eat.  He does have a history of colon cancer.   Past Medical History:  Diagnosis Date  . Arthritis   . Colon cancer ([place])   . Diabetes mellitus without complication ([place])   . High cholesterol   . History of colon cancer   . Hypertension     Past Surgical History:  Procedure Laterality Date  . COLON SURGERY    . KNEE ARTHROSCOPY Bilateral   . 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 TRIGGER FINGER RELEASE Right 05/22/2019    Family History  Problem Relation Age of Onset  . Diabetes Mother   . Hypertension Mother   . Colon cancer Paternal Uncle   . Esophageal cancer Neg Hx   . Inflammatory bowel disease Neg Hx   . Liver disease Neg Hx   . Pancreatic cancer Neg Hx   . Stomach cancer Neg Hx     Social History   Socioeconomic History  . Marital status: Divorced    Spouse name: Not on file  . Number of children: Not on file  . Years of education:  Not on file  . Highest education level: Not on file  Occupational History  . Not on file  Tobacco Use  . Smoking status: Former Smoker    Quit date: 2018    Years since quitting: 3.0  . Smokeless tobacco: Never Used  Substance and Sexual Activity  . Alcohol use: No  . Drug use: No  . Sexual activity: Yes  Other Topics Concern  . Not on file  Social History Narrative  . Not on file   Social Determinants of Health   Financial Resource Strain:   . Difficulty of Paying Living Expenses: Not on file  Food Insecurity:   . Worried About Charity fundraiser in the Last Year: Not on file  . Ran Out of Food in the Last Year: Not on file  Transportation Needs:   . Lack of Transportation (Medical): Not on file  . Lack of Transportation (Non-Medical): Not on file  Physical Activity:   . Days of Exercise per Week: Not on file  . Minutes of Exercise per Session: Not on file  Stress:   . Feeling of Stress : Not on file  Social Connections:   . Frequency of Communication with Friends and  Family: Not on file  . Frequency of Social Gatherings with Friends and Family: Not on file  . Attends Religious Services: Not on file  . Active Member of Clubs or Organizations: Not on file  . Attends Archivist Meetings: Not on file  . Marital Status: Not on file  Intimate Partner Violence:   . Fear of Current or Ex-Partner: Not on file  . Emotionally Abused: Not on file  . Physically Abused: Not on file  . Sexually Abused: Not on file    Outpatient Medications Prior to Visit  Medication Sig Dispense Refill  . ACCU-CHEK GUIDE test strip USE AS DIRECTED UP TO 4 TIMES DAILY. 100 each 0  . atorvastatin (LIPITOR) 40 MG tablet Take 1 tablet (40 mg total) by mouth daily. (Patient taking differently: Take 40 mg by mouth every evening. ) 90 tablet 3  . blood glucose meter kit and supplies KIT Dispense based on patient and insurance preference. Use up to four times daily as directed. (FOR ICD-10   E11.9). 1 each 0  . JARDIANCE 10 MG TABS tablet TAKE 1 TABLET BY MOUTH DAILY. 30 tablet 5  . lisinopril-hydrochlorothiazide (ZESTORETIC) 20-12.5 MG tablet TAKE 1 TABLET BY MOUTH DAILY. 30 tablet 3  . metFORMIN (GLUCOPHAGE) 1000 MG tablet Take 1 tablet (1,000 mg total) by mouth 2 (two) times daily with a meal. 60 tablet 5  . sildenafil (VIAGRA) 100 MG tablet Take 1 tablet (100 mg total) by mouth daily as needed for erectile dysfunction. 10 tablet 2  . tiZANidine (ZANAFLEX) 2 MG tablet Take 1 tablet (2 mg total) by mouth every 6 (six) hours as needed. 60 tablet 0  . traMADol (ULTRAM) 50 MG tablet Take by mouth every 6 (six) hours as needed. Given by dr. Malva Cogan for knee surgery.    [name] aspirin EC 81 MG tablet Take 1 tablet (81 mg total) by mouth 2 (two) times daily. (Patient not taking: Reported on 01/06/2020) 60 tablet 0  . Dulaglutide (TRULICITY) 5.00 BB/0.4UG SOPN Inject 0.75 mg into the skin once a week. (Patient not taking: Reported on 01/06/2020) 4 pen 5  . clotrimazole-betamethasone (LOTRISONE) cream Apply to both feet and between toes bid x 4 weeks. 45 g 0  . oxyCODONE-acetaminophen (PERCOCET/ROXICET) 5-325 MG tablet Take 1 tablet by mouth every 4 (four) hours as needed for severe pain. (Patient not taking: Reported on 01/06/2020) 30 tablet 0   Facility-Administered Medications Prior to Visit  Medication Dose Route Frequency Provider Last Rate Last Admin  . 0.9 %  sodium chloride infusion  500 mL Intravenous Once Mansouraty, Telford Nab., MD        Allergies  Allergen Reactions  . Crab [Shellfish Allergy]     itching    ROS Review of Systems  Constitutional: Positive for appetite change, fatigue and unexpected weight change.  HENT: Negative.   Eyes: Negative.   Respiratory: Negative.   Cardiovascular: Negative.   Gastrointestinal: Negative.   Endocrine: Negative.   Genitourinary: Negative.   Musculoskeletal: Positive for joint swelling.       Right knee   Skin: Negative.    Allergic/Immunologic: Negative.   Neurological: Negative.   Hematological: Negative.   Psychiatric/Behavioral: Negative.       Objective:    Physical Exam  Constitutional: He is oriented to person, place, and time. He appears well-developed.  HENT:  Head: Normocephalic and atraumatic.  Cardiovascular: Regular rhythm.  Murmur heard. Pulmonary/Chest: Effort normal and breath sounds normal.  Abdominal: Soft. Bowel sounds  are normal.  Musculoskeletal:        General: Tenderness and edema present.     Cervical back: Normal range of motion.     Comments: Right knee Mild warmth with palpation well-healed surgical scar  Neurological: He is alert and oriented to person, place, and time.  Skin: Skin is warm and dry.  Psychiatric: He has a normal mood and affect. His behavior is normal. Judgment and thought content normal.    BP (!) 128/48 (BP Location: Right Arm, Patient Position: Sitting, Cuff Size: Large)   Pulse (!) 110   Temp 97.8 F (36.6 C) (Oral)   Resp 16   Ht 5' 9"  (1.753 m)   Wt 204 lb (92.5 kg)   SpO2 100%   BMI 30.13 kg/m  Wt Readings from Last 3 Encounters:  01/06/20 204 lb (92.5 kg)  11/30/19 232 lb (105.2 kg)  11/26/19 232 lb (105.2 kg)     Health Maintenance Due  Topic Date Due  . FOOT EXAM  04/29/2019  . OPHTHALMOLOGY EXAM  07/24/2019    There are no preventive care reminders to display for this patient.  Lab Results  Component Value Date   TSH 0.463 02/03/2010   Lab Results  Component Value Date   WBC 12.2 (H) 12/01/2019   HGB 9.9 (L) 12/01/2019   HCT 31.0 (L) 12/01/2019   MCV 80.7 12/01/2019   PLT 269 12/01/2019   Lab Results  Component Value Date   NA 137 12/01/2019   K 4.5 12/01/2019   CO2 22 12/01/2019   GLUCOSE 231 (H) 12/01/2019   BUN 17 12/01/2019   CREATININE 1.16 12/01/2019   BILITOT 0.8 05/25/2019   ALKPHOS 91 05/25/2019   AST 13 05/25/2019   ALT 16 05/25/2019   PROT 7.1 05/25/2019   ALBUMIN 4.2 05/25/2019   CALCIUM 8.7  (L) 12/01/2019   ANIONGAP 11 12/01/2019   Lab Results  Component Value Date   CHOL 176 01/29/2018   Lab Results  Component Value Date   HDL 74 01/29/2018   Lab Results  Component Value Date   LDLCALC 82 01/29/2018   Lab Results  Component Value Date   TRIG 102 01/29/2018   Lab Results  Component Value Date   CHOLHDL 2.4 01/29/2018   Lab Results  Component Value Date   HGBA1C 6.6 (A) 01/06/2020      Assessment & Plan:   Problem List Items Addressed This Visit      High   Diabetes ([place]) - Primary   Relevant Orders   Glucose (CBG) (Completed)   Urinalysis Dipstick (Completed)   HgB A1c (Completed)   Urine Culture   HTN (hypertension)   Relevant Orders   Urinalysis Dipstick (Completed)   Hyperlipidemia   Relevant Orders   Lipid Panel    Other Visit Diagnoses    Fatigue, unspecified type       Relevant Orders   CBC   Comprehensive metabolic panel   TSH   Pain and swelling of left ankle       Relevant Medications   traMADol (ULTRAM) 50 MG tablet   Other Relevant Orders   Uric Acid   Sedimentation rate   CRP   Weight loss, abnormal       Relevant Orders   TSH      No orders of the defined types were placed in this encounter.   Follow-up: No follow-ups on file.    Vevelyn Francois, NP

## 2020-01-06 NOTE — Patient Instructions (Signed)
Fatigue If you have fatigue, you feel tired all the time and have a lack of energy or a lack of motivation. Fatigue may make it difficult to start or complete tasks because of exhaustion. In general, occasional or mild fatigue is often a normal response to activity or life. However, long-lasting (chronic) or extreme fatigue may be a symptom of a medical condition. Follow these instructions at home: General instructions  Watch your fatigue for any changes.  Go to bed and get up at the same time every day.  Avoid fatigue by pacing yourself during the day and getting enough sleep at night.  Maintain a healthy weight. Medicines  Take over-the-counter and prescription medicines only as told by your health care provider.  Take a multivitamin, if told by your health care provider.  Do not use herbal or dietary supplements unless they are approved by your health care provider. Activity   Exercise regularly, as told by your health care provider.  Use or practice techniques to help you relax, such as yoga, tai chi, meditation, or massage therapy. Eating and drinking   Avoid heavy meals in the evening.  Eat a well-balanced diet, which includes lean proteins, whole grains, plenty of fruits and vegetables, and low-fat dairy products.  Avoid consuming too much caffeine.  Avoid the use of alcohol.  Drink enough fluid to keep your urine pale yellow. Lifestyle  Change situations that cause you stress. Try to keep your work and personal schedule in balance.  Do not use any products that contain nicotine or tobacco, such as cigarettes and e-cigarettes. If you need help quitting, ask your health care provider.  Do not use drugs. Contact a health care provider if:  Your fatigue does not get better.  You have a fever.  You suddenly lose or gain weight.  You have headaches.  You have trouble falling asleep or sleeping through the night.  You feel angry, guilty, anxious, or  sad.  You are unable to have a bowel movement (constipation).  Your skin is dry.  You have swelling in your legs or another part of your body. Get help right away if:  You feel confused.  Your vision is blurry.  You feel faint or you pass out.  You have a severe headache.  You have severe pain in your abdomen, your back, or the area between your waist and hips (pelvis).  You have chest pain, shortness of breath, or an irregular or fast heartbeat.  You are unable to urinate, or you urinate less than normal.  You have abnormal bleeding, such as bleeding from the rectum, vagina, nose, lungs, or nipples.  You vomit blood.  You have thoughts about hurting yourself or others. If you ever feel like you may hurt yourself or others, or have thoughts about taking your own life, get help right away. You can go to your nearest emergency department or call:  Your local emergency services (911 in the [place]  A suicide crisis helpline, such as the [facility] at 9164775271. This is open 24 hours a day. Summary  If you have fatigue, you feel tired all the time and have a lack of energy or a lack of motivation.  Fatigue may make it difficult to start or complete tasks because of exhaustion.  Long-lasting (chronic) or extreme fatigue may be a symptom of a medical condition.  Exercise regularly, as told by your health care provider.  Change situations that cause you stress. Try to keep your  work and personal schedule in balance. This information is not intended to replace advice given to you by your health care provider. Make sure you discuss any questions you have with your health care provider. Document Revised: 07/01/2019 Document Reviewed: 09/04/2017 Elsevier Patient Education  [place]. Anemia  Anemia is a condition in which you do not have enough red blood cells or hemoglobin. Hemoglobin is a substance in red blood cells that carries oxygen.  When you do not have enough red blood cells or hemoglobin (are anemic), your body cannot get enough oxygen and your organs may not work properly. As a result, you may feel very tired or have other problems. What are the causes? Common causes of anemia include:  Excessive bleeding. Anemia can be caused by excessive bleeding inside or outside the body, including bleeding from the intestine or from periods in women.  Poor nutrition.  Long-lasting (chronic) kidney, thyroid, and liver disease.  Bone marrow disorders.  Cancer and treatments for cancer.  HIV (human immunodeficiency virus) and AIDS (acquired immunodeficiency syndrome).  Treatments for HIV and AIDS.  Spleen problems.  Blood disorders.  Infections, medicines, and autoimmune disorders that destroy red blood cells. What are the signs or symptoms? Symptoms of this condition include:  Minor weakness.  Dizziness.  Headache.  Feeling heartbeats that are irregular or faster than normal (palpitations).  Shortness of breath, especially with exercise.  Paleness.  Cold sensitivity.  Indigestion.  Nausea.  Difficulty sleeping.  Difficulty concentrating. Symptoms may occur suddenly or develop slowly. If your anemia is mild, you may not have symptoms. How is this diagnosed? This condition is diagnosed based on:  Blood tests.  Your medical history.  A physical exam.  Bone marrow biopsy. Your health care provider may also check your stool (feces) for blood and may do additional testing to look for the cause of your bleeding. You may also have other tests, including:  Imaging tests, such as a CT scan or MRI.  Endoscopy.  Colonoscopy. How is this treated? Treatment for this condition depends on the cause. If you continue to lose a lot of blood, you may need to be treated at a hospital. Treatment may include:  Taking supplements of iron, vitamin P50, or folic acid.  Taking a hormone medicine (erythropoietin)  that can help to stimulate red blood cell growth.  Having a blood transfusion. This may be needed if you lose a lot of blood.  Making changes to your diet.  Having surgery to remove your spleen. Follow these instructions at home:  Take over-the-counter and prescription medicines only as told by your health care provider.  Take supplements only as told by your health care provider.  Follow any diet instructions that you were given.  Keep all follow-up visits as told by your health care provider. This is important. Contact a health care provider if:  You develop new bleeding anywhere in the body. Get help right away if:  You are very weak.  You are short of breath.  You have pain in your abdomen or chest.  You are dizzy or feel faint.  You have trouble concentrating.  You have bloody or black, tarry stools.  You vomit repeatedly or you vomit up blood. Summary  Anemia is a condition in which you do not have enough red blood cells or enough of a substance in your red blood cells that carries oxygen (hemoglobin).  Symptoms may occur suddenly or develop slowly.  If your anemia is mild, you may  not have symptoms.  This condition is diagnosed with blood tests as well as a medical history and physical exam. Other tests may be needed.  Treatment for this condition depends on the cause of the anemia. This information is not intended to replace advice given to you by your health care provider. Make sure you discuss any questions you have with your health care provider. Document Revised: 11/22/2017 Document Reviewed: 01/11/2017 Elsevier Patient Education  [place].

## 2020-01-07 ENCOUNTER — Other Ambulatory Visit: Payer: Self-pay | Admitting: Nurse Practitioner

## 2020-01-07 DIAGNOSIS — R7982 Elevated C-reactive protein (CRP): Secondary | ICD-10-CM

## 2020-01-07 DIAGNOSIS — R700 Elevated erythrocyte sedimentation rate: Secondary | ICD-10-CM | POA: Insufficient documentation

## 2020-01-07 LAB — URIC ACID: Uric Acid: 8.1 mg/dL (ref 3.8–8.4)

## 2020-01-07 LAB — LIPID PANEL
Chol/HDL Ratio: 2.8 ratio (ref 0.0–5.0)
Cholesterol, Total: 115 mg/dL (ref 100–199)
HDL: 41 mg/dL (ref >39)
LDL Chol Calc (NIH): 56 mg/dL (ref 0–99)
Triglycerides: 92 mg/dL (ref 0–149)
VLDL Cholesterol Cal: 18 mg/dL (ref 5–40)

## 2020-01-07 LAB — C-REACTIVE PROTEIN: CRP: 150 mg/L — ABNORMAL HIGH (ref 0–10)

## 2020-01-07 LAB — TSH: TSH: 1.64 u[IU]/mL (ref 0.450–4.500)

## 2020-01-07 LAB — SEDIMENTATION RATE: Sed Rate: 61 mm/hr — ABNORMAL HIGH (ref 0–30)

## 2020-01-08 ENCOUNTER — Telehealth: Payer: Self-pay | Admitting: Nurse Practitioner

## 2020-01-08 LAB — URINE CULTURE

## 2020-01-08 NOTE — Telephone Encounter (Signed)
Called Kyle Merritt to evaluate current status.  He admits that he continues not to be eating well he denies any shortness of breath or chest pains.  He feels like he is walking a little better but he does still get fatigued after increase ambulation.  He will return to therapy on Monday.   Hemoglobin 13.1 prior to surgery after surgery hemoglobin 9.9.  On 01/05/2019 Hemoglobin 9.0.  He denies any blood loss or blood in stool.  He denies any constipation or diarrhea.  He denies any bloating, nausea or vomiting. He admits that he is having problems getting used to his dentures because he is only had them a few months.  He also admits that his current diet is effective because of how it is being prepared.  He is staying with a friend and he is not used to the cooking.   He admits that he did suffer from food poisoning some time ago and he felt similar.   He was treated for the colon cancer in [facility].  His last colonoscopy was October 2019 he did have some polyps.  The recommendation was to have a repeat colonoscopy 3 to 5 years. Encourage patient to follow-up with any changes.  If he develops shortness of breath chest pain he is to go to the emergency room for further evaluation.

## 2020-01-11 ENCOUNTER — Emergency Department (HOSPITAL_COMMUNITY): Payer: Medicaid Other

## 2020-01-11 ENCOUNTER — Telehealth: Payer: Self-pay | Admitting: Nurse Practitioner

## 2020-01-11 ENCOUNTER — Telehealth: Payer: Self-pay | Admitting: Family Medicine

## 2020-01-11 ENCOUNTER — Encounter (HOSPITAL_COMMUNITY): Payer: Self-pay

## 2020-01-11 ENCOUNTER — Emergency Department (HOSPITAL_COMMUNITY)
Admission: EM | Admit: 2020-01-11 | Discharge: 2020-01-12 | Disposition: A | Discharge status: Home / Self Care | Payer: Medicaid Other | Attending: Emergency Medicine | Admitting: Emergency Medicine

## 2020-01-11 ENCOUNTER — Other Ambulatory Visit: Payer: Self-pay

## 2020-01-11 DIAGNOSIS — R5383 Other fatigue: Secondary | ICD-10-CM | POA: Diagnosis not present

## 2020-01-11 DIAGNOSIS — Z96651 Presence of right artificial knee joint: Secondary | ICD-10-CM | POA: Insufficient documentation

## 2020-01-11 DIAGNOSIS — Z79899 Other long term (current) drug therapy: Secondary | ICD-10-CM | POA: Insufficient documentation

## 2020-01-11 DIAGNOSIS — I10 Essential (primary) hypertension: Secondary | ICD-10-CM | POA: Diagnosis not present

## 2020-01-11 DIAGNOSIS — Z87891 Personal history of nicotine dependence: Secondary | ICD-10-CM | POA: Insufficient documentation

## 2020-01-11 DIAGNOSIS — R0602 Shortness of breath: Secondary | ICD-10-CM | POA: Insufficient documentation

## 2020-01-11 DIAGNOSIS — Z20822 Contact with and (suspected) exposure to covid-19: Secondary | ICD-10-CM | POA: Insufficient documentation

## 2020-01-11 DIAGNOSIS — R079 Chest pain, unspecified: Secondary | ICD-10-CM | POA: Diagnosis not present

## 2020-01-11 DIAGNOSIS — E119 Type 2 diabetes mellitus without complications: Secondary | ICD-10-CM | POA: Insufficient documentation

## 2020-01-11 DIAGNOSIS — Z7984 Long term (current) use of oral hypoglycemic drugs: Secondary | ICD-10-CM | POA: Diagnosis not present

## 2020-01-11 LAB — COMPREHENSIVE METABOLIC PANEL
ALT: 9 U/L (ref 0–44)
AST: 10 U/L — ABNORMAL LOW (ref 15–41)
Albumin: 3.3 g/dL — ABNORMAL LOW (ref 3.5–5.0)
Alkaline Phosphatase: 86 U/L (ref 38–126)
Anion gap: 13 (ref 5–15)
BUN: 24 mg/dL — ABNORMAL HIGH (ref 8–23)
CO2: 25 mmol/L (ref 22–32)
Calcium: 9.4 mg/dL (ref 8.9–10.3)
Chloride: 96 mmol/L — ABNORMAL LOW (ref 98–111)
Creatinine, Ser: 1.6 mg/dL — ABNORMAL HIGH (ref 0.61–1.24)
GFR calc Af Amer: 53 mL/min — ABNORMAL LOW (ref >60)
GFR calc non Af Amer: 46 mL/min — ABNORMAL LOW (ref >60)
Glucose, Bld: 234 mg/dL — ABNORMAL HIGH (ref 70–99)
Potassium: 3.7 mmol/L (ref 3.5–5.1)
Sodium: 134 mmol/L — ABNORMAL LOW (ref 135–145)
Total Bilirubin: 0.6 mg/dL (ref 0.3–1.2)
Total Protein: 8.1 g/dL (ref 6.5–8.1)

## 2020-01-11 LAB — CBC
HCT: 28.7 % — ABNORMAL LOW (ref 39.0–52.0)
Hemoglobin: 8.9 g/dL — ABNORMAL LOW (ref 13.0–17.0)
MCH: 24.4 pg — ABNORMAL LOW (ref 26.0–34.0)
MCHC: 31 g/dL (ref 30.0–36.0)
MCV: 78.6 fL — ABNORMAL LOW (ref 80.0–100.0)
Platelets: 678 10*3/uL — ABNORMAL HIGH (ref 150–400)
RBC: 3.65 MIL/uL — ABNORMAL LOW (ref 4.22–5.81)
RDW: 14.8 % (ref 11.5–15.5)
WBC: 8 10*3/uL (ref 4.0–10.5)
nRBC: 0 % (ref 0.0–0.2)

## 2020-01-11 MED FILL — metFORMIN HCL 1000 MG TABS: 1000 | 30 days supply | Qty: 60 | Fill #4

## 2020-01-11 NOTE — Telephone Encounter (Signed)
Please review. Patient c/o fatigue after being out yesterday.

## 2020-01-11 NOTE — Telephone Encounter (Signed)
Spoke with Kyle Merritt on the phone.  59 year old male with a history of colon cancer treated in [place] is status post total right knee approximately 1 month ago.   He is able to carry on a full conversation without any shortness of breath.  He has been having fatigue but now he is having shortness of breath on exertion.  He is status post total right knee approximately 1 month ago.  Hemoglobin prior to surgery was 13.3 hemoglobin after surgery 9.9 retake hemoglobin 9.0.  He has a decreased appetite and has had weight loss.  He admits this is related to pain and the lack of wanting specific meals being prepared.  I instructed to Kyle Merritt that I am concerned with his history of cancer and recent surgery that he may have a blood clot.  I encouraged the patient to go to the emergency room for evaluation to rule out pulmonary embolism.

## 2020-01-11 NOTE — ED Triage Notes (Signed)
Patient c/o SOB and fatigue x 2 weeks. Patient denies any chest pain. Patient had knee surgery 1 month ago. patient also has history of colon cancer. Patient called his PCP today and was told to come to the ED to rule out a PE.

## 2020-01-12 ENCOUNTER — Ambulatory Visit: Payer: Medicaid Other | Admitting: Physical Therapy

## 2020-01-12 ENCOUNTER — Emergency Department (HOSPITAL_COMMUNITY): Payer: Medicaid Other

## 2020-01-12 ENCOUNTER — Encounter (HOSPITAL_COMMUNITY): Payer: Self-pay

## 2020-01-12 DIAGNOSIS — R0602 Shortness of breath: Secondary | ICD-10-CM | POA: Diagnosis not present

## 2020-01-12 LAB — POC SARS CORONAVIRUS 2 AG -  ED: SARS Coronavirus 2 Ag: NEGATIVE

## 2020-01-12 LAB — TROPONIN I (HIGH SENSITIVITY): Troponin I (High Sensitivity): 9 ng/L (ref <18)

## 2020-01-12 MED ORDER — SODIUM CHLORIDE 0.9 % IV SOLN: INTRAVENOUS | Status: DC

## 2020-01-12 MED ORDER — SODIUM CHLORIDE (PF) 0.9 % IJ SOLN
INTRAMUSCULAR | Status: AC
  Filled 2020-01-12: qty 50

## 2020-01-12 MED ORDER — IOHEXOL 350 MG/ML SOLN
100.0000 mL | Freq: Once | INTRAVENOUS | Status: AC | PRN
  Administered 2020-01-12: 100 mL via INTRAVENOUS

## 2020-01-12 MED ORDER — SODIUM CHLORIDE 0.9 % IV BOLUS
500.0000 mL | Freq: Once | INTRAVENOUS | Status: AC
  Administered 2020-01-12: 500 mL via INTRAVENOUS

## 2020-01-12 NOTE — Discharge Instructions (Signed)
Drink lots of water and have your kidney function rechecked in one week by your doctor

## 2020-01-12 NOTE — ED Provider Notes (Signed)
[facility] DEPT Provider Note   CSN: [id number] Arrival date & time: 01/11/20  1053     History Chief Complaint  Patient presents with  . Shortness of Breath  . Fatigue    Rollo Farquhar is a 59 y.o. male.  The history is provided by the patient.  Shortness of Breath Severity:  Moderate Onset quality:  Gradual Duration:  2 weeks Timing:  Constant Progression:  Unchanged Chronicity:  New Context: activity   Relieved by:  Nothing Worsened by:  Nothing Ineffective treatments:  None tried Associated symptoms: no abdominal pain, no chest pain, no claudication, no cough, no diaphoresis, no ear pain, no fever, no headaches, no hemoptysis, no neck pain, no PND, no rash, no sore throat, no sputum production, no syncope, no swollen glands, no vomiting and no wheezing   Risk factors: recent surgery   Patient who had knee surgery in November started exercising and feels winded and fatigued with activity.  Sent in by doctor for rule out PE     Past Medical History:  Diagnosis Date  . Arthritis   . Colon cancer ([place])   . Diabetes mellitus without complication ([place])   . High cholesterol   . History of colon cancer   . Hypertension     Patient Active Problem List   Diagnosis Date Noted  . Elevated sed rate 01/07/2020  . Elevated C-reactive protein (CRP) 01/07/2020  . S/P TKR (total knee replacement), right 11/30/2019  . Osteoarthritis of right knee 11/26/2019  . Constipation 10/06/2018  . Olecranon bursitis of left elbow 06/04/2018  . Trigger finger, acquired 06/04/2018  . Cervical disc disorder with radiculopathy of cervical region 05/08/2018  . Other chronic pain 02/04/2018  . History of cocaine use 02/04/2018  . Hyperlipidemia 02/04/2018  . Colon cancer screening 11/29/2014  . Knee pain, acute 10/04/2014  . Essential hypertension 08/02/2014  . Dental abscess 08/02/2014  . Diabetes ([place]) 04/29/2014  . HTN (hypertension) 04/29/2014  .  Dyslipidemia 04/29/2014  . History of colon cancer 04/29/2014  . Back pain 04/29/2014  . Erectile dysfunction 04/29/2014  . Tobacco abuse 07/27/2013  . Pure hypercholesterolemia 12/31/2008  . TESTOSTERONE DEFICIENCY 12/10/2008  . GERD 12/08/2008  . ONYCHOMYCOSIS, BILATERAL 11/09/2008  . DIABETES MELLITUS 11/09/2008  . Anemia 11/09/2008  . Malignant neoplasm of colon ([place]) 12/24/2002    Past Surgical History:  Procedure Laterality Date  . COLON SURGERY    . KNEE ARTHROSCOPY Bilateral   . 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 TRIGGER FINGER RELEASE Right 05/22/2019       Family History  Problem Relation Age of Onset  . Diabetes Mother   . Hypertension Mother   . Colon cancer Paternal Uncle   . Esophageal cancer Neg Hx   . Inflammatory bowel disease Neg Hx   . Liver disease Neg Hx   . Pancreatic cancer Neg Hx   . Stomach cancer Neg Hx     Social History   Tobacco Use  . Smoking status: Former Smoker    Quit date: 2018    Years since quitting: 3.0  . Smokeless tobacco: Never Used  Substance Use Topics  . Alcohol use: No  . Drug use: No    Home Medications Prior to Admission medications   Medication Sig Start Date End Date Taking? Authorizing Provider  atorvastatin (LIPITOR) 40 MG tablet  Take 1 tablet (40 mg total) by mouth daily. Patient taking differently: Take 40 mg by mouth every evening.  02/16/19  Yes Lanae Boast, FNP  JARDIANCE 10 MG TABS tablet TAKE 1 TABLET BY MOUTH DAILY. 11/23/19  Yes Jegede, Olugbemiga E, MD  lisinopril-hydrochlorothiazide (ZESTORETIC) 20-12.5 MG tablet TAKE 1 TABLET BY MOUTH DAILY. 11/06/19  Yes Azzie Glatter, FNP  metFORMIN (GLUCOPHAGE) 1000 MG tablet Take 1 tablet (1,000 mg total) by mouth 2 (two) times daily with a meal. 08/11/19  Yes Lanae Boast, FNP  traMADol (ULTRAM) 50 MG tablet Take  50 mg by mouth every 6 (six) hours as needed. Given by dr. Malva Cogan for knee surgery.    Yes [provider]  ACCU-CHEK GUIDE test strip USE AS DIRECTED UP TO 4 TIMES DAILY. 04/08/19   Lanae Boast, FNP  aspirin EC 81 MG tablet Take 1 tablet (81 mg total) by mouth 2 (two) times daily. Patient not taking: Reported on 01/06/2020 11/30/19   Leighton Parody, PA-C  blood glucose meter kit and supplies KIT Dispense based on patient and insurance preference. Use up to four times daily as directed. (FOR ICD-10  E11.9). 02/16/19   Lanae Boast, FNP  Dulaglutide (TRULICITY) 8.25 KN/3.9JQ SOPN Inject 0.75 mg into the skin once a week. Patient not taking: Reported on 01/06/2020 08/26/19   Lanae Boast, FNP  sildenafil (VIAGRA) 100 MG tablet Take 1 tablet (100 mg total) by mouth daily as needed for erectile dysfunction. 09/23/19   Tresa Garter, MD  tiZANidine (ZANAFLEX) 2 MG tablet Take 1 tablet (2 mg total) by mouth every 6 (six) hours as needed. Patient not taking: Reported on 01/12/2020 11/30/19   Leighton Parody, PA-C    Allergies    Otho Darner allergy]  Review of Systems   Review of Systems  Constitutional: Positive for fatigue. Negative for diaphoresis and fever.  HENT: Negative for ear pain and sore throat.   Eyes: Negative for visual disturbance.  Respiratory: Positive for shortness of breath. Negative for cough, hemoptysis, sputum production and wheezing.   Cardiovascular: Negative for chest pain, claudication, syncope and PND.  Gastrointestinal: Negative for abdominal pain and vomiting.  Genitourinary: Negative for difficulty urinating.  Musculoskeletal: Negative for neck pain.  Skin: Negative for rash.  Neurological: Negative for headaches.  Psychiatric/Behavioral: Negative for agitation.  All other systems reviewed and are negative.   Physical Exam Updated Vital Signs BP 114/85 (BP Location: Right Arm)   Pulse (!) 104   Temp 98.9 F (37.2 C) (Oral)   Resp 18    Ht 5' 9"  (1.753 m)   Wt 92.5 kg   SpO2 100%   BMI 30.13 kg/m   Physical Exam Vitals and nursing note reviewed.  Constitutional:      Appearance: Normal appearance. He is not ill-appearing.  HENT:     Head: Normocephalic and atraumatic.     Nose: Nose normal.  Eyes:     Conjunctiva/sclera: Conjunctivae normal.     Pupils: Pupils are equal, round, and reactive to light.  Cardiovascular:     Rate and Rhythm: Normal rate and regular rhythm.     Pulses: Normal pulses.     Heart sounds: Normal heart sounds.  Pulmonary:     Effort: Pulmonary effort is normal.     Breath sounds: Normal breath sounds.  Abdominal:     General: Abdomen is flat. Bowel sounds are normal.     Tenderness: There is no abdominal tenderness. There is no guarding or  rebound.  Musculoskeletal:        General: No tenderness. Normal range of motion.     Cervical back: Normal range of motion and neck supple.     Right lower leg: No edema.     Left lower leg: No edema.  Skin:    General: Skin is warm and dry.     Capillary Refill: Capillary refill takes less than 2 seconds.  Neurological:     General: No focal deficit present.     Mental Status: He is alert and oriented to person, place, and time.     Deep Tendon Reflexes: Reflexes normal.  Psychiatric:        Mood and Affect: Mood normal.        Behavior: Behavior normal.     ED Results / Procedures / Treatments   Labs (all labs ordered are listed, but only abnormal results are displayed) Results for orders placed or performed during the hospital encounter of 01/11/20  CBC  Result Value Ref Range   WBC 8.0 4.0 - 10.5 K/uL   RBC 3.65 (L) 4.22 - 5.81 MIL/uL   Hemoglobin 8.9 (L) 13.0 - 17.0 g/dL   HCT 28.7 (L) 39.0 - 52.0 %   MCV 78.6 (L) 80.0 - 100.0 fL   MCH 24.4 (L) 26.0 - 34.0 pg   MCHC 31.0 30.0 - 36.0 g/dL   RDW 14.8 11.5 - 15.5 %   Platelets 678 (H) 150 - 400 K/uL   nRBC 0.0 0.0 - 0.2 %  Comprehensive metabolic panel  Result Value Ref Range     Sodium 134 (L) 135 - 145 mmol/L   Potassium 3.7 3.5 - 5.1 mmol/L   Chloride 96 (L) 98 - 111 mmol/L   CO2 25 22 - 32 mmol/L   Glucose, Bld 234 (H) 70 - 99 mg/dL   BUN 24 (H) 8 - 23 mg/dL   Creatinine, Ser 1.60 (H) 0.61 - 1.24 mg/dL   Calcium 9.4 8.9 - 10.3 mg/dL   Total Protein 8.1 6.5 - 8.1 g/dL   Albumin 3.3 (L) 3.5 - 5.0 g/dL   AST 10 (L) 15 - 41 U/L   ALT 9 0 - 44 U/L   Alkaline Phosphatase 86 38 - 126 U/L   Total Bilirubin 0.6 0.3 - 1.2 mg/dL   GFR calc non Af Amer 46 (L) >60 mL/min   GFR calc Af Amer 53 (L) >60 mL/min   Anion gap 13 5 - 15   DG Chest 2 View  Result Date: 01/11/2020 CLINICAL DATA:  Shortness of breath and fatigue for 2 weeks. No chest pain. Knee surgery 1 month ago. History of colon cancer. EXAM: CHEST - 2 VIEW COMPARISON:  November 26, 2019 FINDINGS: Healed left rib fractures. The heart, hila, mediastinum, lungs, and pleura are normal. IMPRESSION: No active cardiopulmonary disease. Electronically Signed   By: Dorise Bullion III M.D   On: 01/11/2020 12:55  = EKG EKG Interpretation  Date/Time:  Monday January 11 2020 11:11:12 EST Ventricular Rate:  99 PR Interval:    QRS Duration: 97 QT Interval:  341 QTC Calculation: 438 R Axis:   48 Text Interpretation: Sinus arrhythmia Multiform ventricular premature complexes Nonspecific T abnormalities, inferior leads No significant change since last tracing Abnormal ECG Confirmed by Carmin Muskrat [id number]) on 01/11/2020 11:14:34 AM   Radiology DG Chest 2 View  Result Date: 01/11/2020 CLINICAL DATA:  Shortness of breath and fatigue for 2 weeks. No chest pain. Knee surgery 1 month ago.  History of colon cancer. EXAM: CHEST - 2 VIEW COMPARISON:  November 26, 2019 FINDINGS: Healed left rib fractures. The heart, hila, mediastinum, lungs, and pleura are normal. IMPRESSION: No active cardiopulmonary disease. Electronically Signed   By: Dorise Bullion III M.D   On: 01/11/2020 12:55    Procedures Procedures (including  critical care time)  Medications Ordered in ED Medications  sodium chloride (PF) 0.9 % injection (has no administration in time range)  sodium chloride (PF) 0.9 % injection (has no administration in time range)  sodium chloride 0.9 % bolus 500 mL (has no administration in time range)  iohexol (OMNIPAQUE) 350 MG/ML injection 100 mL (100 mLs Intravenous Contrast Given 01/12/20 0201)    ED Course  I have reviewed the triage vital signs and the nursing notes.  Pertinent labs & imaging results that were available during my care of the patient were reviewed by me and considered in my medical decision making (see chart for details).   Ruled out for PE in the ED.  Given the time course of 2 weeks with ongoing symptoms one troponin is sufficient to exclude ACS heart score is 1 patient is very low risk for MACE.   Stable for discharge with close follow up.    I suspect this is deconditioning from not working out post surgery.   Minard Millirons was evaluated in Emergency Department on 01/12/2020 for the symptoms described in the history of present illness. He was evaluated in the context of the global COVID-19 pandemic, which necessitated consideration that the patient might be at risk for infection with the SARS-CoV-2 virus that causes COVID-19. Institutional protocols and algorithms that pertain to the evaluation of patients at risk for COVID-19 are in a state of rapid change based on information released by regulatory bodies including the CDC and federal and state organizations. These policies and algorithms were followed during the patient's care in the ED.   Final Clinical Impression(s) / ED Diagnoses  Return for intractable cough, coughing up blood,fevers >100.4 unrelieved by medication, shortness of breath, intractable vomiting, chest pain, shortness of breath, weakness,numbness, changes in speech, facial asymmetry,abdominal pain, passing out,Inability to tolerate liquids or food, cough, altered  mental status or any concerns. No signs of systemic illness or infection. The patient is nontoxic-appearing on exam and vital signs are within normal limits.   I have reviewed the triage vital signs and the nursing notes. Pertinent labs &imaging results that were available during my care of the patient were reviewed by me and considered in my medical decision making (see chart for details).  After history, exam, and medical workup I feel the patient has been appropriately medically screened and is safe for discharge home. Pertinent diagnoses were discussed with the patient. Patient was given returnprecautions.     Austin Pongratz, MD 01/12/20 [phone]

## 2020-01-13 LAB — COMPREHENSIVE METABOLIC PANEL
ALT: 6 IU/L (ref 0–44)
AST: 9 IU/L (ref 0–40)
Albumin/Globulin Ratio: 0.9 — ABNORMAL LOW (ref 1.2–2.2)
Albumin: 3.6 g/dL — ABNORMAL LOW (ref 3.8–4.9)
Alkaline Phosphatase: 112 IU/L (ref 39–117)
BUN/Creatinine Ratio: 12 (ref 10–24)
BUN: 18 mg/dL (ref 8–27)
Bilirubin Total: 0.3 mg/dL (ref 0.0–1.2)
CO2: 17 mmol/L — ABNORMAL LOW (ref 20–29)
Calcium: 10 mg/dL (ref 8.6–10.2)
Chloride: 93 mmol/L — ABNORMAL LOW (ref 96–106)
Creatinine, Ser: 1.5 mg/dL — ABNORMAL HIGH (ref 0.76–1.27)
GFR calc Af Amer: 58 mL/min/{1.73_m2} — ABNORMAL LOW (ref >59)
GFR calc non Af Amer: 50 mL/min/{1.73_m2} — ABNORMAL LOW (ref >59)
Globulin, Total: 4.2 g/dL (ref 1.5–4.5)
Glucose: 180 mg/dL — ABNORMAL HIGH (ref 65–99)
Potassium: 4.2 mmol/L (ref 3.5–5.2)
Sodium: 136 mmol/L (ref 134–144)
Total Protein: 7.8 g/dL (ref 6.0–8.5)

## 2020-01-13 LAB — CBC
Hematocrit: 30.3 % — ABNORMAL LOW (ref 37.5–51.0)
Hemoglobin: 9 g/dL — ABNORMAL LOW (ref 13.0–17.7)
MCH: 24.7 pg — ABNORMAL LOW (ref 26.6–33.0)
MCHC: 29.7 g/dL — ABNORMAL LOW (ref 31.5–35.7)
MCV: 83 fL (ref 79–97)
Platelets: 622 10*3/uL — ABNORMAL HIGH (ref 150–450)
RBC: 3.65 x10E6/uL — ABNORMAL LOW (ref 4.14–5.80)
RDW: 15.1 % (ref 11.6–15.4)
WBC: 8.5 10*3/uL (ref 3.4–10.8)

## 2020-01-13 LAB — SPECIMEN STATUS REPORT

## 2020-01-17 ENCOUNTER — Other Ambulatory Visit: Payer: Self-pay | Admitting: Family Medicine

## 2020-01-17 DIAGNOSIS — N529 Male erectile dysfunction, unspecified: Secondary | ICD-10-CM

## 2020-01-18 ENCOUNTER — Telehealth: Payer: Self-pay | Admitting: Family Medicine

## 2020-01-18 NOTE — Telephone Encounter (Signed)
This has been refilled.  Thanks. 

## 2020-01-25 ENCOUNTER — Ambulatory Visit: Payer: Medicaid Other | Attending: Internal Medicine | Admitting: Physical Therapy

## 2020-01-25 ENCOUNTER — Other Ambulatory Visit: Payer: Self-pay

## 2020-01-25 ENCOUNTER — Encounter: Payer: Self-pay | Admitting: Physical Therapy

## 2020-01-25 DIAGNOSIS — M25561 Pain in right knee: Secondary | ICD-10-CM | POA: Insufficient documentation

## 2020-01-25 DIAGNOSIS — R2689 Other abnormalities of gait and mobility: Secondary | ICD-10-CM | POA: Insufficient documentation

## 2020-01-25 DIAGNOSIS — M25661 Stiffness of right knee, not elsewhere classified: Secondary | ICD-10-CM | POA: Diagnosis present

## 2020-01-25 DIAGNOSIS — R600 Localized edema: Secondary | ICD-10-CM | POA: Diagnosis present

## 2020-01-25 DIAGNOSIS — M6281 Muscle weakness (generalized): Secondary | ICD-10-CM

## 2020-01-25 NOTE — Therapy (Signed)
[place], [place], [address] Phone: [phone]   Fax:  [phone]  Physical Therapy Treatment Resubmitted for additional visits  Patient Details  Name: Kyle Merritt MRN: [id number] Date of Birth: [birth year] Referring Provider (PT): Dr. Frederik Pear    Encounter Date: 01/25/2020  PT End of Session - 01/25/20 0936    Visit Number  4    Number of Visits  4    Authorization Type  Submitted for 12 additional visits 01/25/2020 -    Authorization - Visit Number  1    PT Start Time  9798    PT Stop Time  1110    PT Time Calculation (min)  55 min    Activity Tolerance  Patient tolerated treatment well    Behavior During Therapy  [facility] for tasks assessed/performed       Past Medical History:  Diagnosis Date  . Arthritis   . Colon cancer ([place])   . Diabetes mellitus without complication ([place])   . High cholesterol   . History of colon cancer   . Hypertension     Past Surgical History:  Procedure Laterality Date  . COLON SURGERY    . KNEE ARTHROSCOPY Bilateral   . 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 TRIGGER FINGER RELEASE Right 05/22/2019    There were no vitals filed for this visit.  Subjective Assessment - 01/25/20 0924    Subjective  Pt reporting ankle is much better today. Pt reporting no pain at rest.    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  No/denies    Pain Onset  1 to 4 weeks ago    Pain Frequency  Intermittent         [facility] PT Assessment - 01/25/20 0001      Assessment   Medical Diagnosis  R TKA    Referring Provider (PT)  Dr. Frederik Pear     Onset Date/Surgical Date  11/30/19    Hand Dominance  Left      Precautions   Precaution Comments  TKA      Balance  Screen   Has the patient fallen in the past 6 months  No    Is the patient reluctant to leave their home because of a fear of falling?   No      Circumferential Edema   Circumferential - Right  51.5 centimeters      AROM   Right Knee Extension  -3    Right Knee Flexion  105      PROM   PROM Assessment Site  Knee    Right/Left Knee  Right    Right Knee Extension  0    Right Knee Flexion  110      Strength   Right/Left Hip  Right    Right Hip Flexion  4+/5    Right/Left Knee  Right    Right Knee Flexion  4/5    Right Knee Extension  4/5                   [facility] Adult PT Treatment/Exercise - 01/25/20 0001      Knee/Hip Exercises: Aerobic   Nustep  5' L5 UE/LE for knee ROM  Knee/Hip Exercises: Standing   Heel Raises  Both;10 reps   UE assist    Hip Abduction  10 reps    Abduction Limitations  each side , needs bilat UE     Forward Step Up  1 set;15 reps;Hand Hold: 1;Step Height: 6"    Functional Squat  10 reps    Other Standing Knee Exercises  marching with single UE support      Knee/Hip Exercises: Seated   Long Arc Quad  10 reps;2 sets    Long Arc Quad Weight  4 lbs.    Sit to Sand  10 reps   needs UE to rise, cues for controlled descent      Knee/Hip Exercises: Supine   Short Arc Quad Sets  10 reps;2 sets    Short Arc Quad Sets Limitations  4#    Bridges  15 reps    Straight Leg Raises  Strengthening;15 reps    Straight Leg Raises Limitations  no extensor lag noted      Vasopneumatic   Number Minutes Vasopneumatic   10 minutes    Vasopnuematic Location   Knee    Vasopneumatic Pressure  Medium    Vasopneumatic Temperature   34 degrees             PT Education - 01/25/20 0925    Education Details  reviewed HEP    Person(s) Educated  Patient    Methods  Explanation;Demonstration    Comprehension  Verbalized understanding;Returned demonstration       PT Short Term Goals - 01/25/20 0928      PT SHORT TERM GOAL #1   Title  I with  initial HEP for knee ROM and gait ( 01/04/2020) )    Baseline  pt able to demonstrate and verbally recall his HEP    Time  3    Period  Weeks    Status  Achieved      PT SHORT TERM GOAL #2   Title  demo Rt knee flexion =/> 110 degrees to allow ease with stepping up a stair ( 01/04/2020)    Baseline  105 degrees actively R knee    Time  3    Period  Weeks    Status  On-going    Target Date  01/04/20      PT SHORT TERM GOAL #3   Title  demo strong contraction of Rt quad to assist with standing stabilizaiton ( 01/04/2020)    Baseline  pt able to perform SLR with no quad lag noted    Time  3    Period  Weeks    Status  Achieved      PT SHORT TERM GOAL #4   Title  ambulate iwth least restrictive AD and upright posture ( 01/04/2020)    Baseline  walking with straight cane with mild antalgic gait    Time  3    Status  On-going      PT SHORT TERM GOAL #5   Title  Pt will be able to report driving greater than 30 minutes with no pain reported.    Time  4    Period  Weeks    Status  New    Target Date  02/23/20        PT Long Term Goals - 01/25/20 0934      PT LONG TERM GOAL #1   Title  to be established after first three visits    Baseline  Pt showed  improvement in functional strength, but was not all 5/5    Time  6    Period  Weeks    Status  Partially Met    Target Date  03/07/20      PT LONG TERM GOAL #2   Title  Pt will be able to perform step up on greater than 10 inch step with single UE support.    Baseline  unable to currently    Time  6    Period  Weeks    Status  New            Plan - 01/25/20 9470    Clinical Impression Statement  Pt arriving to therpay reporting no pain at rest. Pt still amb with mild antalgic gait pattern with single point straight cane. Pt with 4/5 weakness noted in R knee flexion and extension. Pt presenting with no extensor lag today. Pt still progressing toward increased balance and strength in order to return to work stepping in/out  of trunk. Pt has met 50% of his goals set. 2 additional goals set this visit. Requesting 12 additional visits.    Personal Factors and Comorbidities  Comorbidity 3+    Comorbidities  DM, HTN, arthritis, h/o neck issues.    Examination-Activity Limitations  Bathing;Bed Mobility;Sit;Sleep;Squat;Hygiene/Grooming;Lift;Other    Stability/Clinical Decision Making  Stable/Uncomplicated    Rehab Potential  Excellent    PT Frequency  3x / week    PT Treatment/Interventions  Iontophoresis 26m/ml Dexamethasone;Gait training;Taping;Vasopneumatic Device;Patient/family education;Functional mobility training;Moist Heat;Ultrasound;Passive range of motion;Therapeutic exercise;Cryotherapy;EScientist, product/process developmentNeuromuscular re-education;Manual techniques    PT Next Visit Plan  progress Rt knee ROM, quad contraction/LE strength and mobility - assess and request additional medicad vists- needs to schedule    PT Home Exercise Plan  [facility], SLR, seated heel slide, LAQ, standing mini squats    Consulted and Agree with Plan of Care  Patient       Patient will benefit from skilled therapeutic intervention in order to improve the following deficits and impairments:  Abnormal gait, Decreased range of motion, Pain, Decreased activity tolerance, Decreased balance, Decreased knowledge of use of DME, Impaired flexibility, Increased edema, Decreased strength, Decreased mobility  Visit Diagnosis: Stiffness of right knee, not elsewhere classified  Muscle weakness (generalized)  Other abnormalities of gait and mobility  Localized edema  Acute pain of right knee     Problem List Patient Active Problem List   Diagnosis Date Noted  . Elevated sed rate 01/07/2020  . Elevated C-reactive protein (CRP) 01/07/2020  . S/P TKR (total knee replacement), right 11/30/2019  . Osteoarthritis of right knee 11/26/2019  . Constipation 10/06/2018  . Olecranon bursitis of left elbow  06/04/2018  . Trigger finger, acquired 06/04/2018  . Cervical disc disorder with radiculopathy of cervical region 05/08/2018  . Other chronic pain 02/04/2018  . History of cocaine use 02/04/2018  . Hyperlipidemia 02/04/2018  . Colon cancer screening 11/29/2014  . Knee pain, acute 10/04/2014  . Essential hypertension 08/02/2014  . Dental abscess 08/02/2014  . Diabetes ([place] 04/29/2014  . HTN (hypertension) 04/29/2014  . Dyslipidemia 04/29/2014  . History of colon cancer 04/29/2014  . Back pain 04/29/2014  . Erectile dysfunction 04/29/2014  . Tobacco abuse 07/27/2013  . Pure hypercholesterolemia 12/31/2008  . TESTOSTERONE DEFICIENCY 12/10/2008  . GERD 12/08/2008  . ONYCHOMYCOSIS, BILATERAL 11/09/2008  . DIABETES MELLITUS 11/09/2008  . Anemia 11/09/2008  . [place] of [place] ([place] 12/24/2002    JOretha Caprice  PT 01/25/2020, 9:56 AM  [place], [place], [address] Phone: [phone]   Fax:  [phone]  Name: Osman Calzadilla MRN: [id number] Date of Birth: 09/03/1961

## 2020-01-26 MED FILL — JARDIANCE 10 MG TABLET: 10 | 30 days supply | Qty: 30 | Fill #1

## 2020-01-26 MED FILL — LISINOPRIL-HCTZ 20-12.5 MG: 20-12.5 | 30 days supply | Qty: 30 | Fill #2

## 2020-01-29 ENCOUNTER — Ambulatory Visit: Payer: Medicaid Other | Admitting: Family Medicine

## 2020-01-29 ENCOUNTER — Encounter: Payer: Self-pay | Admitting: Nurse Practitioner

## 2020-01-29 ENCOUNTER — Ambulatory Visit (INDEPENDENT_AMBULATORY_CARE_PROVIDER_SITE_OTHER): Payer: Medicaid Other | Admitting: Nurse Practitioner

## 2020-01-29 ENCOUNTER — Other Ambulatory Visit: Payer: Self-pay

## 2020-01-29 VITALS — BP 131/74 | HR 100 | Temp 98.6°F | Resp 16 | Ht 69.0 in | Wt 215.0 lb

## 2020-01-29 DIAGNOSIS — I10 Essential (primary) hypertension: Secondary | ICD-10-CM

## 2020-01-29 DIAGNOSIS — R5383 Other fatigue: Secondary | ICD-10-CM | POA: Diagnosis not present

## 2020-01-29 DIAGNOSIS — Z96651 Presence of right artificial knee joint: Secondary | ICD-10-CM | POA: Diagnosis not present

## 2020-01-29 NOTE — Progress Notes (Signed)
Established Patient Office Visit  Subjective:  Patient ID: Kyle Merritt, male    DOB: April 20, 1961  Age: 59 y.o. MRN: [id number]  CC:  Chief Complaint  Patient presents with  . Follow-up    seen in ER was told to follow up to check kidney function     HPI [name] presents for follow up. He has HTN, DM, HLD, OA and Colon cancer. He is status post right total knee arthroscopy on 11/30/2019.[name] He was having fatigue, weight loss and pain. He admits that he is doing well. His appetite has improved. He has gained 12 pounds in the last month. His energy level has increased. He continues with his PT. He is grateful. Denies headache, dizziness, visual changes, shortness of breath, dyspnea on exertion, chest pain, nausea, vomiting or any edema.      Past Medical History:  Diagnosis Date  . Arthritis   . Colon cancer ([place])   . Diabetes mellitus without complication ([place])   . High cholesterol   . History of colon cancer   . Hypertension     Past Surgical History:  Procedure Laterality Date  . COLON SURGERY    . KNEE ARTHROSCOPY Bilateral   . 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 TRIGGER FINGER RELEASE Right 05/22/2019    Family History  Problem Relation Age of Onset  . Diabetes Mother   . Hypertension Mother   . Colon cancer Paternal Uncle   . Esophageal cancer Neg Hx   . Inflammatory bowel disease Neg Hx   . Liver disease Neg Hx   . Pancreatic cancer Neg Hx   . Stomach cancer Neg Hx     Social History   Socioeconomic History  . Marital status: Divorced    Spouse name: Not on file  . Number of children: Not on file  . Years of education: Not on file  . Highest education level: Not on file  Occupational History  . Not on file  Tobacco Use  . Smoking status: Former Smoker    Quit date: 2018    Years since quitting: 3.1  .  Smokeless tobacco: Never Used  Substance and Sexual Activity  . Alcohol use: No  . Drug use: No  . Sexual activity: Yes  Other Topics Concern  . Not on file  Social History Narrative  . Not on file   Social Determinants of Health   Financial Resource Strain:   . Difficulty of Paying Living Expenses: Not on file  Food Insecurity:   . Worried About Charity fundraiser in the Last Year: Not on file  . Ran Out of Food in the Last Year: Not on file  Transportation Needs:   . Lack of Transportation (Medical): Not on file  . Lack of Transportation (Non-Medical): Not on file  Physical Activity:   . Days of Exercise per Week: Not on file  . Minutes of Exercise per Session: Not on file  Stress:   . Feeling of Stress : Not on file  Social Connections:   . Frequency of Communication with Friends and Family: Not on file  . Frequency of Social Gatherings with Friends and Family: Not on file  . Attends Religious Services: Not on file  . Active Member of Clubs or Organizations: Not on file  . Attends Archivist Meetings: Not on file  .  Marital Status: Not on file  Intimate Partner Violence:   . Fear of Current or Ex-Partner: Not on file  . Emotionally Abused: Not on file  . Physically Abused: Not on file  . Sexually Abused: Not on file    Outpatient Medications Prior to Visit  Medication Sig Dispense Refill  . ACCU-CHEK GUIDE test strip USE AS DIRECTED UP TO 4 TIMES DAILY. 100 each 0  . atorvastatin (LIPITOR) 40 MG tablet Take 1 tablet (40 mg total) by mouth daily. (Patient taking differently: Take 40 mg by mouth every evening. ) 90 tablet 3  . blood glucose meter kit and supplies KIT Dispense based on patient and insurance preference. Use up to four times daily as directed. (FOR ICD-10  E11.9). 1 each 0  . JARDIANCE 10 MG TABS tablet TAKE 1 TABLET BY MOUTH DAILY. 30 tablet 5  . lisinopril-hydrochlorothiazide (ZESTORETIC) 20-12.5 MG tablet TAKE 1 TABLET BY MOUTH DAILY. 30  tablet 3  . metFORMIN (GLUCOPHAGE) 1000 MG tablet Take 1 tablet (1,000 mg total) by mouth 2 (two) times daily with a meal. 60 tablet 5  . sildenafil (VIAGRA) 100 MG tablet TAKE 1 TABLET BY MOUTH DAILY AS NEEDED FOR  ERECTILE  DYSFUNCTION 10 tablet 0  . traMADol (ULTRAM) 50 MG tablet Take 50 mg by mouth every 6 (six) hours as needed. Given by dr. Malva Cogan for knee surgery.     [name] aspirin EC 81 MG tablet Take 1 tablet (81 mg total) by mouth 2 (two) times daily. (Patient not taking: Reported on 01/06/2020) 60 tablet 0  . Dulaglutide (TRULICITY) 3.79 KW/4.0XB SOPN Inject 0.75 mg into the skin once a week. (Patient not taking: Reported on 01/06/2020) 4 pen 5  . tiZANidine (ZANAFLEX) 2 MG tablet Take 1 tablet (2 mg total) by mouth every 6 (six) hours as needed. (Patient not taking: Reported on 01/12/2020) 60 tablet 0   Facility-Administered Medications Prior to Visit  Medication Dose Route Frequency Provider Last Rate Last Admin  . 0.9 %  sodium chloride infusion  500 mL Intravenous Once Mansouraty, Telford Nab., MD        Allergies  Allergen Reactions  . Crab [Shellfish Allergy]     itching    ROS Review of Systems  All other systems reviewed and are negative.     Objective:    Physical Exam  Constitutional: He is oriented to person, place, and time. He appears well-developed and well-nourished.  HENT:  Head: Normocephalic.  Cardiovascular: Normal rate, regular rhythm and normal heart sounds.  Pulmonary/Chest: Effort normal and breath sounds normal.  Abdominal: Soft.  Musculoskeletal:     Cervical back: Normal range of motion.     Comments: Well healing surgical knee incision   Neurological: He is alert and oriented to person, place, and time.  Skin: Skin is warm and dry.  Psychiatric: He has a normal mood and affect. His behavior is normal. Judgment and thought content normal.    BP 131/74 (BP Location: Right Arm, Patient Position: Sitting, Cuff Size: Large)   Pulse 100   Temp 98.6  F (37 C) (Oral)   Resp 16   Ht 5' 9"  (1.753 m)   Wt 215 lb (97.5 kg)   SpO2 100%   BMI 31.75 kg/m  Wt Readings from Last 3 Encounters:  01/29/20 215 lb (97.5 kg)  01/11/20 204 lb (92.5 kg)  01/06/20 204 lb (92.5 kg)     Health Maintenance Due  Topic Date Due  . FOOT EXAM  04/29/2019  . OPHTHALMOLOGY EXAM  07/24/2019    There are no preventive care reminders to display for this patient.  Lab Results  Component Value Date   TSH 1.640 01/06/2020   Lab Results  Component Value Date   WBC 8.0 01/11/2020   HGB 8.9 (L) 01/11/2020   HCT 28.7 (L) 01/11/2020   MCV 78.6 (L) 01/11/2020   PLT 678 (H) 01/11/2020   Lab Results  Component Value Date   NA 140 01/29/2020   K 4.5 01/29/2020   CO2 25 01/11/2020   GLUCOSE 149 (H) 01/29/2020   BUN 15 01/29/2020   CREATININE 1.18 01/29/2020   BILITOT 0.5 01/29/2020   ALKPHOS 121 (H) 01/29/2020   AST 8 01/29/2020   ALT 9 01/11/2020   PROT 7.2 01/29/2020   ALBUMIN 3.9 01/29/2020   CALCIUM 9.8 01/29/2020   ANIONGAP 13 01/11/2020   Lab Results  Component Value Date   CHOL 115 01/06/2020   Lab Results  Component Value Date   HDL 41 01/06/2020   Lab Results  Component Value Date   LDLCALC 56 01/06/2020   Lab Results  Component Value Date   TRIG 92 01/06/2020   Lab Results  Component Value Date   CHOLHDL 2.8 01/06/2020   Lab Results  Component Value Date   HGBA1C 6.6 (A) 01/06/2020      Assessment & Plan:   Problem List Items Addressed This Visit      High   Essential hypertension   Relevant Orders   Comp. Metabolic Panel (12) (Completed)     Medium   S/P TKR (total knee replacement), right    Other Visit Diagnoses    Fatigue, unspecified type    -  Primary   improved  FU as scheduled      No orders of the defined types were placed in this encounter.   Follow-up: Return in about 3 months (around 04/27/2020).    Vevelyn Francois, NP

## 2020-01-30 DIAGNOSIS — M1712 Unilateral primary osteoarthritis, left knee: Secondary | ICD-10-CM | POA: Diagnosis not present

## 2020-01-30 LAB — COMP. METABOLIC PANEL (12)
AST: 8 IU/L (ref 0–40)
Albumin/Globulin Ratio: 1.2 (ref 1.2–2.2)
Albumin: 3.9 g/dL (ref 3.8–4.8)
Alkaline Phosphatase: 121 IU/L — ABNORMAL HIGH (ref 39–117)
BUN/Creatinine Ratio: 13 (ref 10–24)
BUN: 15 mg/dL (ref 8–27)
Bilirubin Total: 0.5 mg/dL (ref 0.0–1.2)
Calcium: 9.8 mg/dL (ref 8.6–10.2)
Chloride: 100 mmol/L (ref 96–106)
Creatinine, Ser: 1.18 mg/dL (ref 0.76–1.27)
GFR calc Af Amer: 77 mL/min/{1.73_m2} (ref >59)
GFR calc non Af Amer: 66 mL/min/{1.73_m2} (ref >59)
Globulin, Total: 3.3 g/dL (ref 1.5–4.5)
Glucose: 149 mg/dL — ABNORMAL HIGH (ref 65–99)
Potassium: 4.5 mmol/L (ref 3.5–5.2)
Sodium: 140 mmol/L (ref 134–144)
Total Protein: 7.2 g/dL (ref 6.0–8.5)

## 2020-02-02 ENCOUNTER — Encounter: Payer: Self-pay | Admitting: Physical Therapy

## 2020-02-02 ENCOUNTER — Ambulatory Visit: Payer: Medicaid Other | Admitting: Physical Therapy

## 2020-02-02 ENCOUNTER — Other Ambulatory Visit: Payer: Self-pay

## 2020-02-02 DIAGNOSIS — M25661 Stiffness of right knee, not elsewhere classified: Secondary | ICD-10-CM

## 2020-02-02 DIAGNOSIS — M25561 Pain in right knee: Secondary | ICD-10-CM

## 2020-02-02 DIAGNOSIS — R600 Localized edema: Secondary | ICD-10-CM

## 2020-02-02 DIAGNOSIS — R2689 Other abnormalities of gait and mobility: Secondary | ICD-10-CM

## 2020-02-02 DIAGNOSIS — M6281 Muscle weakness (generalized): Secondary | ICD-10-CM

## 2020-02-02 NOTE — Therapy (Signed)
[place], [place], [address] Phone: [phone]   Fax:  [phone]  Physical Therapy Treatment  Patient Details  Name: Kyle Merritt MRN: [id number] Date of Birth: 05-25-1961 Referring Provider (PT): Dr. Frederik Pear    Encounter Date: 02/02/2020  PT End of Session - 02/02/20 0932    Visit Number  5    Number of Visits  16    Date for PT Re-Evaluation  01/04/20    Authorization Type  CCME approved 12 additional visits    Authorization Time Period  02/02/20-03/14/20    PT Start Time  0814    PT Stop Time  1016    PT Time Calculation (min)  48 min       Past Medical History:  Diagnosis Date  . Arthritis   . Colon cancer ([place])   . Diabetes mellitus without complication ([place])   . High cholesterol   . History of colon cancer   . Hypertension     Past Surgical History:  Procedure Laterality Date  . COLON SURGERY    . KNEE ARTHROSCOPY Bilateral   . 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 TRIGGER FINGER RELEASE Right 05/22/2019    There were no vitals filed for this visit.  Subjective Assessment - 02/02/20 0930    Subjective  Just stiffness. Pain is 1/10. I have been working on my knee myself.    Currently in Pain?  Yes    Pain Score  1     Pain Location  Knee    Pain Orientation  Left    Pain Descriptors / Indicators  Tightness   stiff   Aggravating Factors   after exercises    Pain Relieving Factors  rest         [facility] PT Assessment - 02/02/20 0001      AROM   Right Knee Extension  -3    Right Knee Flexion  116                   [facility] Adult PT Treatment/Exercise - 02/02/20 0001      Knee/Hip Exercises: Standing   Heel Raises  Both;10 reps   UE assist    Hip Abduction  Right;Left;20 reps    Lateral Step Up  20 reps;Hand Hold: 1;Step  Height: 4"    Forward Step Up  20 reps;Hand Hold: 1;Step Height: 6"    Functional Squat  15 reps    SLS  3-4 sec best bilat     Other Standing Knee Exercises  tandem stance  >1 min    Other Standing Knee Exercises  Marching in parallel bars       Knee/Hip Exercises: Seated   Long Arc Quad  20 reps    Long Arc Quad Weight  5 lbs.    Sit to [employer]  10 reps   no UE      Knee/Hip Exercises: Supine   Quad Sets  20 reps    Straight Leg Raises  Strengthening;15 reps      Modalities   Modalities  Cryotherapy      Cryotherapy   Number Minutes Cryotherapy  10 Minutes    Cryotherapy Location  Knee    Type of Cryotherapy  Ice pack               PT  Short Term Goals - 02/02/20 1002      PT SHORT TERM GOAL #1   Title  I with initial HEP for knee ROM and gait ( 01/04/2020) )    Baseline  pt able to demonstrate and verbally recall his HEP    Time  3    Period  Weeks    Status  Achieved    Target Date  01/04/20      PT SHORT TERM GOAL #2   Title  demo Rt knee flexion =/> 110 degrees to allow ease with stepping up a stair ( 01/04/2020)    Baseline  116 degrees actively R knee    Time  3    Period  Weeks    Status  Achieved    Target Date  01/04/20      PT SHORT TERM GOAL #3   Title  demo strong contraction of Rt quad to assist with standing stabilizaiton ( 01/04/2020)    Baseline  pt able to perform SLR with no quad lag noted    Time  3    Period  Weeks    Status  Achieved      PT SHORT TERM GOAL #4   Title  ambulate with least restrictive AD and upright posture ( 01/04/2020)    Baseline  without AD, min antalgic gait    Time  3    Period  Weeks    Status  Achieved      PT SHORT TERM GOAL #5   Title  Pt will be able to report driving greater than 30 minutes with no pain reported.    Baseline  returned to driving without difficulty up to 30 minutes    Time  4    Period  Weeks    Status  Achieved        PT Long Term Goals - 01/25/20 0934      PT LONG TERM GOAL #1    Title  to be established after first three visits    Baseline  Pt showed improvement in functional strength, but was not all 5/5    Time  6    Period  Weeks    Status  Partially Met    Target Date  03/07/20      PT LONG TERM GOAL #2   Title  Pt will be able to perform step up on greater than 10 inch step with single UE support.    Baseline  unable to currently    Time  6    Period  Weeks    Status  New            Plan - 02/02/20 1006    Clinical Impression Statement  Pt arrives without AD and min antalgic gait. He has been working on sit-stands and step ups as well as tandem balance. Able to progress with SLS and continued with closed chain strengthening. His flexion has improved to 116 actively. Needs more quad strength.    PT Next Visit Plan  quad strength, SLS    PT Home Exercise Plan  Accesss Code At [facility]: QS, SLR, seated heel slide, LAQ, standing mini squats       Patient will benefit from skilled therapeutic intervention in order to improve the following deficits and impairments:  Abnormal gait, Decreased range of motion, Pain, Decreased activity tolerance, Decreased balance, Decreased knowledge of use of DME, Impaired flexibility, Increased edema, Decreased strength, Decreased mobility  Visit Diagnosis: Stiffness of  right knee, not elsewhere classified  Muscle weakness (generalized)  Other abnormalities of gait and mobility  Localized edema  Acute pain of right knee     Problem List Patient Active Problem List   Diagnosis Date Noted  . Elevated sed rate 01/07/2020  . Elevated C-reactive protein (CRP) 01/07/2020  . S/P TKR (total knee replacement), right 11/30/2019  . Osteoarthritis of right knee 11/26/2019  . Constipation 10/06/2018  . Olecranon bursitis of left elbow 06/04/2018  . Trigger finger, acquired 06/04/2018  . Cervical disc disorder with radiculopathy of cervical region 05/08/2018  . Other chronic pain 02/04/2018  . History of  cocaine use 02/04/2018  . Hyperlipidemia 02/04/2018  . Colon cancer screening 11/29/2014  . Knee pain, acute 10/04/2014  . Essential hypertension 08/02/2014  . Dental abscess 08/02/2014  . Diabetes ([place]) 04/29/2014  . HTN (hypertension) 04/29/2014  . Dyslipidemia 04/29/2014  . History of colon cancer 04/29/2014  . Back pain 04/29/2014  . Erectile dysfunction 04/29/2014  . Tobacco abuse 07/27/2013  . Pure hypercholesterolemia 12/31/2008  . TESTOSTERONE DEFICIENCY 12/10/2008  . GERD 12/08/2008  . ONYCHOMYCOSIS, BILATERAL 11/09/2008  . DIABETES MELLITUS 11/09/2008  . Anemia 11/09/2008  . Malignant neoplasm of colon [facility]) 12/24/2002    Dorene Ar, PTA 02/02/2020, 10:09 AM  [facility] [address] [place], [place], [address] Phone: [phone]   Fax:  [phone]  Name: Kyle Merritt MRN: [id number] Date of Birth: 27-Jul-1961

## 2020-02-04 ENCOUNTER — Other Ambulatory Visit: Payer: Self-pay

## 2020-02-04 ENCOUNTER — Encounter: Payer: Self-pay | Admitting: Physical Therapy

## 2020-02-04 ENCOUNTER — Ambulatory Visit: Payer: Medicaid Other | Admitting: Physical Therapy

## 2020-02-04 DIAGNOSIS — M25561 Pain in right knee: Secondary | ICD-10-CM

## 2020-02-04 DIAGNOSIS — M25661 Stiffness of right knee, not elsewhere classified: Secondary | ICD-10-CM

## 2020-02-04 DIAGNOSIS — R2689 Other abnormalities of gait and mobility: Secondary | ICD-10-CM

## 2020-02-04 DIAGNOSIS — M6281 Muscle weakness (generalized): Secondary | ICD-10-CM

## 2020-02-04 DIAGNOSIS — R600 Localized edema: Secondary | ICD-10-CM

## 2020-02-04 NOTE — Therapy (Addendum)
[place], [place], [address] Phone: [phone]   Fax:  [phone]  Physical Therapy Treatment/Discharge  Patient Details  Name: Kyle Merritt MRN: [id number] Date of Birth: 03-Nov-1961 Referring Provider (PT): Dr. Frederik Pear    Encounter Date: 02/04/2020  PT End of Session - 02/04/20 0936    Visit Number  6    Number of Visits  16    Authorization Type  CCME approved 12 additional visits    Authorization Time Period  02/02/20-03/14/20    Authorization - Visit Number  2    Authorization - Number of Visits  3    PT Start Time  0930    PT Stop Time  1030    PT Time Calculation (min)  60 min       Past Medical History:  Diagnosis Date  . Arthritis   . Colon cancer ([place])   . Diabetes mellitus without complication ([place])   . High cholesterol   . History of colon cancer   . Hypertension     Past Surgical History:  Procedure Laterality Date  . COLON SURGERY    . KNEE ARTHROSCOPY Bilateral   . 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 TRIGGER FINGER RELEASE Right 05/22/2019    There were no vitals filed for this visit.  Subjective Assessment - 02/04/20 0936    Subjective  No pain. I would like to use the ice compression machine this time.    Currently in Pain?  No/denies                       [facility] Adult PT Treatment/Exercise - 02/04/20 0001      Knee/Hip Exercises: Stretches   Gastroc Stretch Limitations  runners stretch       Knee/Hip Exercises: Aerobic   Nustep  6' L5 LE  nly for knee ROM      Knee/Hip Exercises: Machines for Strengthening   Cybex Knee Extension  5# bilat for concentric and Rt only for eccentric     Cybex Knee Flexion  15# bilat concentric and Rt only eccentric      Knee/Hip Exercises: Standing   Heel Raises  Both;10 reps    UE assist    Hip Abduction  Right;Left;20 reps    Lateral Step Up  20 reps;Hand Hold: 1;Step Height: 6"    Forward Step Up  20 reps;Hand Hold: 1;Step Height: 8"    Functional Squat  15 reps    Functional Squat Limitations  sink squat with cues for technique     SLS  10 sec best     Other Standing Knee Exercises  tandem stance  >1 min      Knee/Hip Exercises: Supine   Bridges  15 reps    Bridges Limitations  with DF    Straight Leg Raises  10 reps   2 sets   Straight Leg Raises Limitations  2lb, cues to maintaining extension       Knee/Hip Exercises: Sidelying   Hip ABduction  10 reps    Hip ABduction Limitations  2 sets each LE      Vasopneumatic   Number Minutes Vasopneumatic   15 minutes    Vasopnuematic Location   Knee    Vasopneumatic Pressure  Medium    Vasopneumatic Temperature   34 degrees  PT Short Term Goals - 02/02/20 1002      PT SHORT TERM GOAL #1   Title  I with initial HEP for knee ROM and gait ( 01/04/2020) )    Baseline  pt able to demonstrate and verbally recall his HEP    Time  3    Period  Weeks    Status  Achieved    Target Date  01/04/20      PT SHORT TERM GOAL #2   Title  demo Rt knee flexion =/> 110 degrees to allow ease with stepping up a stair ( 01/04/2020)    Baseline  116 degrees actively R knee    Time  3    Period  Weeks    Status  Achieved    Target Date  01/04/20      PT SHORT TERM GOAL #3   Title  demo strong contraction of Rt quad to assist with standing stabilizaiton ( 01/04/2020)    Baseline  pt able to perform SLR with no quad lag noted    Time  3    Period  Weeks    Status  Achieved      PT SHORT TERM GOAL #4   Title  ambulate with least restrictive AD and upright posture ( 01/04/2020)    Baseline  without AD, min antalgic gait    Time  3    Period  Weeks    Status  Achieved      PT SHORT TERM GOAL #5   Title  Pt will be able to report driving greater than 30 minutes with no pain reported.     Baseline  returned to driving without difficulty up to 30 minutes    Time  4    Period  Weeks    Status  Achieved        PT Long Term Goals - 01/25/20 0934      PT LONG TERM GOAL #1   Title  to be established after first three visits    Baseline  Pt showed improvement in functional strength, but was not all 5/5    Time  6    Period  Weeks    Status  Partially Met    Target Date  03/07/20      PT LONG TERM GOAL #2   Title  Pt will be able to perform step up on greater than 10 inch step with single UE support.    Baseline  unable to currently    Time  6    Period  Weeks    Status  New            Plan - 02/04/20 1024    Clinical Impression Statement  Pt arrived without pain. Continued with strength challenges and SLS.Sharma Covert. he tolerated session Well. Weak in hip abductors.    PT Next Visit Plan  quad strength, SLS, hip strength    PT Home Exercise Plan  Accesss Code At [facility]: QS, SLR, seated heel slide, LAQ, standing mini squats       Patient will benefit from skilled therapeutic intervention in order to improve the following deficits and impairments:  Abnormal gait, Decreased range of motion, Pain, Decreased activity tolerance, Decreased balance, Decreased knowledge of use of DME, Impaired flexibility, Increased edema, Decreased strength, Decreased mobility  Visit Diagnosis: Stiffness of right knee, not elsewhere classified  Muscle weakness (generalized)  Other abnormalities of gait and mobility  Localized edema  Acute pain of  right knee     Problem List Patient Active Problem List   Diagnosis Date Noted  . Elevated sed rate 01/07/2020  . Elevated C-reactive protein (CRP) 01/07/2020  . S/P TKR (total knee replacement), right 11/30/2019  . Osteoarthritis of right knee 11/26/2019  . Constipation 10/06/2018  . Olecranon bursitis of left elbow 06/04/2018  . Trigger finger, acquired 06/04/2018  . Cervical disc disorder with radiculopathy of  cervical region 05/08/2018  . Other chronic pain 02/04/2018  . History of cocaine use 02/04/2018  . Hyperlipidemia 02/04/2018  . Colon cancer screening 11/29/2014  . Knee pain, acute 10/04/2014  . Essential hypertension 08/02/2014  . Dental abscess 08/02/2014  . Diabetes ([place]) 04/29/2014  . HTN (hypertension) 04/29/2014  . Dyslipidemia 04/29/2014  . History of colon cancer 04/29/2014  . Back pain 04/29/2014  . Erectile dysfunction 04/29/2014  . Tobacco abuse 07/27/2013  . Pure hypercholesterolemia 12/31/2008  . TESTOSTERONE DEFICIENCY 12/10/2008  . GERD 12/08/2008  . ONYCHOMYCOSIS, BILATERAL 11/09/2008  . DIABETES MELLITUS 11/09/2008  . Anemia 11/09/2008  . Malignant neoplasm of colon [facility]) 12/24/2002    Dorene Ar, PTA 02/04/2020, 10:27 AM  [facility] [address] [place], [place], [address] Phone: [phone]   Fax:  [phone]  Name: Kyle Merritt MRN: [id number] Date of Birth: 1961/02/11   PHYSICAL THERAPY DISCHARGE SUMMARY  Visits from Start of Care: 6  Current functional level related to goals / functional outcomes: Unknown, pt has not returned for tx, attempted to call, no answer and no VM set up   Remaining deficits: unknown   Education / Equipment: HEP Plan:                                                    Patient goals were partially met. Patient is being discharged due to not returning since the last visit.  ?????    Jeral Pinch, PT 03/23/20 10:40 AM

## 2020-02-04 NOTE — Addendum Note (Signed)
Addended by: Oretha Caprice on: 02/04/2020 07:15 PM   Modules accepted: Orders

## 2020-02-05 ENCOUNTER — Ambulatory Visit: Payer: Medicaid Other | Admitting: Podiatry

## 2020-02-05 ENCOUNTER — Encounter: Payer: Self-pay | Admitting: Podiatry

## 2020-02-05 DIAGNOSIS — M79675 Pain in left toe(s): Secondary | ICD-10-CM | POA: Diagnosis not present

## 2020-02-05 DIAGNOSIS — M79674 Pain in right toe(s): Secondary | ICD-10-CM

## 2020-02-05 DIAGNOSIS — L600 Ingrowing nail: Secondary | ICD-10-CM | POA: Diagnosis not present

## 2020-02-05 DIAGNOSIS — B351 Tinea unguium: Secondary | ICD-10-CM

## 2020-02-05 DIAGNOSIS — E119 Type 2 diabetes mellitus without complications: Secondary | ICD-10-CM

## 2020-02-05 NOTE — Patient Instructions (Addendum)
Apply antibiotic ointment and a band aid after soaking  EPSOM SALT FOOT SOAK INSTRUCTIONS  1.  Place 1/4 cup of epsom salts in 2 quarts of warm tap water. IF YOU ARE DIABETIC, OR HAVE NEUROPATHY,  CHECK THE TEMPERATURE OF THE WATER WITH YOUR ELBOW.  2.  Submerge your foot/feet in the solution and soak for 20 minutes.      3.  Next, remove your foot or feet from solution, blot dry the affected area.    4.  Apply antibiotic ointment and cover with fabric band-aid .  5.  This soak should be done once a day for 3 days days.   6.  Monitor for any signs/symptoms of infection such as redness, swelling, odor, drainage, increased pain, or non-healing of digit.   7.  Please do not hesitate to call the office and speak to a Nurse or Doctor if you have questions.   8.  If you experience fever, chills, nightsweats, nausea or vomiting with worsening of digit, please go to the emergency room.    Diabetes Mellitus and Foot Care Foot care is an important part of your health, especially when you have diabetes. Diabetes may cause you to have problems because of poor blood flow (circulation) to your feet and legs, which can cause your skin to:  Become thinner and drier.  Break more easily.  Heal more slowly.  Peel and crack. You may also have nerve damage (neuropathy) in your legs and feet, causing decreased feeling in them. This means that you may not notice minor injuries to your feet that could lead to more serious problems. Noticing and addressing any potential problems early is the best way to prevent future foot problems. How to care for your feet Foot hygiene  Wash your feet daily with warm water and mild soap. Do not use hot water. Then, pat your feet and the areas between your toes until they are completely dry. Do not soak your feet as this can dry your skin.  Trim your toenails straight across. Do not dig under them or around the cuticle. File the edges of your nails with an emery board  or nail file.  Apply a moisturizing lotion or petroleum jelly to the skin on your feet and to dry, brittle toenails. Use lotion that does not contain alcohol and is unscented. Do not apply lotion between your toes. Shoes and socks  Wear clean socks or stockings every day. Make sure they are not too tight. Do not wear knee-high stockings since they may decrease blood flow to your legs.  Wear shoes that fit properly and have enough cushioning. Always look in your shoes before you put them on to be sure there are no objects inside.  To break in new shoes, wear them for just a few hours a day. This prevents injuries on your feet. Wounds, scrapes, corns, and calluses  Check your feet daily for blisters, cuts, bruises, sores, and redness. If you cannot see the bottom of your feet, use a mirror or ask someone for help.  Do not cut corns or calluses or try to remove them with medicine.  If you find a minor scrape, cut, or break in the skin on your feet, keep it and the skin around it clean and dry. You may clean these areas with mild soap and water. Do not clean the area with peroxide, alcohol, or iodine.  If you have a wound, scrape, corn, or callus on your foot, look at it  several times a day to make sure it is healing and not infected. Check for: ? Redness, swelling, or pain. ? Fluid or blood. ? Warmth. ? Pus or a bad smell. General instructions  Do not cross your legs. This may decrease blood flow to your feet.  Do not use heating pads or hot water bottles on your feet. They may burn your skin. If you have lost feeling in your feet or legs, you may not know this is happening until it is too late.  Protect your feet from hot and cold by wearing shoes, such as at the beach or on hot pavement.  Schedule a complete foot exam at least once a year (annually) or more often if you have foot problems. If you have foot problems, report any cuts, sores, or bruises to your health care provider  immediately. Contact a health care provider if:  You have a medical condition that increases your risk of infection and you have any cuts, sores, or bruises on your feet.  You have an injury that is not healing.  You have redness on your legs or feet.  You feel burning or tingling in your legs or feet.  You have pain or cramps in your legs and feet.  Your legs or feet are numb.  Your feet always feel cold.  You have pain around a toenail. Get help right away if:  You have a wound, scrape, corn, or callus on your foot and: ? You have pain, swelling, or redness that gets worse. ? You have fluid or blood coming from the wound, scrape, corn, or callus. ? Your wound, scrape, corn, or callus feels warm to the touch. ? You have pus or a bad smell coming from the wound, scrape, corn, or callus. ? You have a fever. ? You have a red line going up your leg. Summary  Check your feet every day for cuts, sores, red spots, swelling, and blisters.  Moisturize feet and legs daily.  Wear shoes that fit properly and have enough cushioning.  If you have foot problems, report any cuts, sores, or bruises to your health care provider immediately.  Schedule a complete foot exam at least once a year (annually) or more often if you have foot problems. This information is not intended to replace advice given to you by your health care provider. Make sure you discuss any questions you have with your health care provider. Document Revised: 09/02/2019 Document Reviewed: 01/11/2017 Elsevier Patient Education  [place].

## 2020-02-07 NOTE — Progress Notes (Signed)
Subjective: Kyle Merritt presents today for follow up of preventative diabetic foot care and painful mycotic nails b/l that are difficult to trim. Pain interferes with ambulation. Aggravating factors include wearing enclosed shoe gear. Pain is relieved with periodic professional debridement.   He relates he has had an episode of his left great toe swelling and painful. It has since resolved. He recalls bunion area being tender as well. He states he was told it was not gout.   Allergies  Allergen Reactions  . Crab [Shellfish Allergy]     itching     Objective: There were no vitals filed for this visit.  Vascular Examination:  Capillary fill time to digits <3s b/l, palpable DP pulses b/l, palpable PT pulses b/l, pedal hair sparse b/l and skin temperature gradient within normal limits b/l  Dermatological Examination: Pedal skin with normal turgor, texture and tone bilaterally, no open wounds bilaterally, no interdigital macerations bilaterally, toenails 1-5 b/l elongated, dystrophic, thickened, crumbly with subungual debris and hyperkeratotic lesion(s) submet head 1 b/l.  No erythema, no edema, no drainage, no flocculence.  Incurvated nailplate right great toe b/l border(s) with tenderness to palpation. No erythema, no edema, no drainage noted.  Musculoskeletal: Normal muscle strength 5/5 to all lower extremity muscle groups bilaterally, no pain crepitus or joint limitation noted with ROM b/l and bunion deformity noted b/l  Neurological: Protective sensation intact 5/5 intact bilaterally with 10g monofilament b/l and vibratory sensation intact b/l  Assessment: 1. Pain due to onychomycosis of toenails of both feet   2. Controlled type 2 diabetes mellitus without complication, without long-term current use of insulin ([place])   3. Ingrown toenail without infection     Plan: -Continue diabetic foot care principles. Literature dispensed on today.  -Toenails 1-5 b/l were debrided in length  and girth without iatrogenic bleeding. -calluses were debrided without complication or incident. Total number debrided =2 submet head 1 b/l -Patient to continue soft, supportive shoe gear daily. -Patient to report any pedal injuries to medical professional immediately. Offending nail border debrided and curretaged right hallux. Border cleansed with alcohol and triple antibiotic applied. No further treatment required by patient/caregiver. Dispensed written instructions for once daily epsom salt soaks for 3 days for ingrown toenail.  -Patient/POA to call should there be question/concern in the interim.  Return in about 3 months (around 05/04/2020) for diabetic nail trim.

## 2020-02-08 ENCOUNTER — Telehealth: Payer: Self-pay | Admitting: Family Medicine

## 2020-02-08 ENCOUNTER — Ambulatory Visit: Payer: Medicaid Other | Admitting: Physical Therapy

## 2020-02-08 ENCOUNTER — Other Ambulatory Visit: Payer: Self-pay

## 2020-02-08 DIAGNOSIS — N529 Male erectile dysfunction, unspecified: Secondary | ICD-10-CM

## 2020-02-08 MED ORDER — SILDENAFIL CITRATE 100 MG PO TABS: ORAL_TABLET | ORAL | 0 refills | Status: DC

## 2020-02-09 MED FILL — metFORMIN HCL 1000 MG TABS: 1000 | 30 days supply | Qty: 60 | Fill #5

## 2020-02-09 NOTE — Telephone Encounter (Signed)
done

## 2020-02-10 ENCOUNTER — Ambulatory Visit: Payer: Medicaid Other | Admitting: Physical Therapy

## 2020-02-15 ENCOUNTER — Ambulatory Visit: Payer: Medicaid Other | Admitting: Physical Therapy

## 2020-02-15 ENCOUNTER — Telehealth: Payer: Self-pay | Admitting: Physical Therapy

## 2020-02-15 NOTE — Telephone Encounter (Signed)
Pt was called about missed PT appointment today 02/15/2020 at 9:15. Pt did not answer the phone and voice mail box has not been established.   Kearney Hard, PT 02/15/20 4:28 PM

## 2020-02-17 ENCOUNTER — Ambulatory Visit: Payer: Medicaid Other | Admitting: Physical Therapy

## 2020-02-25 ENCOUNTER — Other Ambulatory Visit: Payer: Self-pay | Admitting: Family Medicine

## 2020-02-25 DIAGNOSIS — E1169 Type 2 diabetes mellitus with other specified complication: Secondary | ICD-10-CM

## 2020-02-25 MED FILL — metFORMIN HCL 1000 MG TABS: 1000 | 30 days supply | Qty: 60 | Fill #0

## 2020-02-25 MED FILL — JARDIANCE 10 MG TABLET: 10 | 30 days supply | Qty: 30 | Fill #2

## 2020-02-25 MED FILL — LISINOPRIL-HCTZ 20-12.5 MG: 20-12.5 | 30 days supply | Qty: 30 | Fill #3

## 2020-03-08 IMAGING — CR DG CHEST 2V
2 series · 2 of 2 positions shown · non-contrast
Comparison: None.

CLINICAL DATA: Preop for knee surgery.

EXAM:
CHEST - 2 VIEW

[w chest pa]
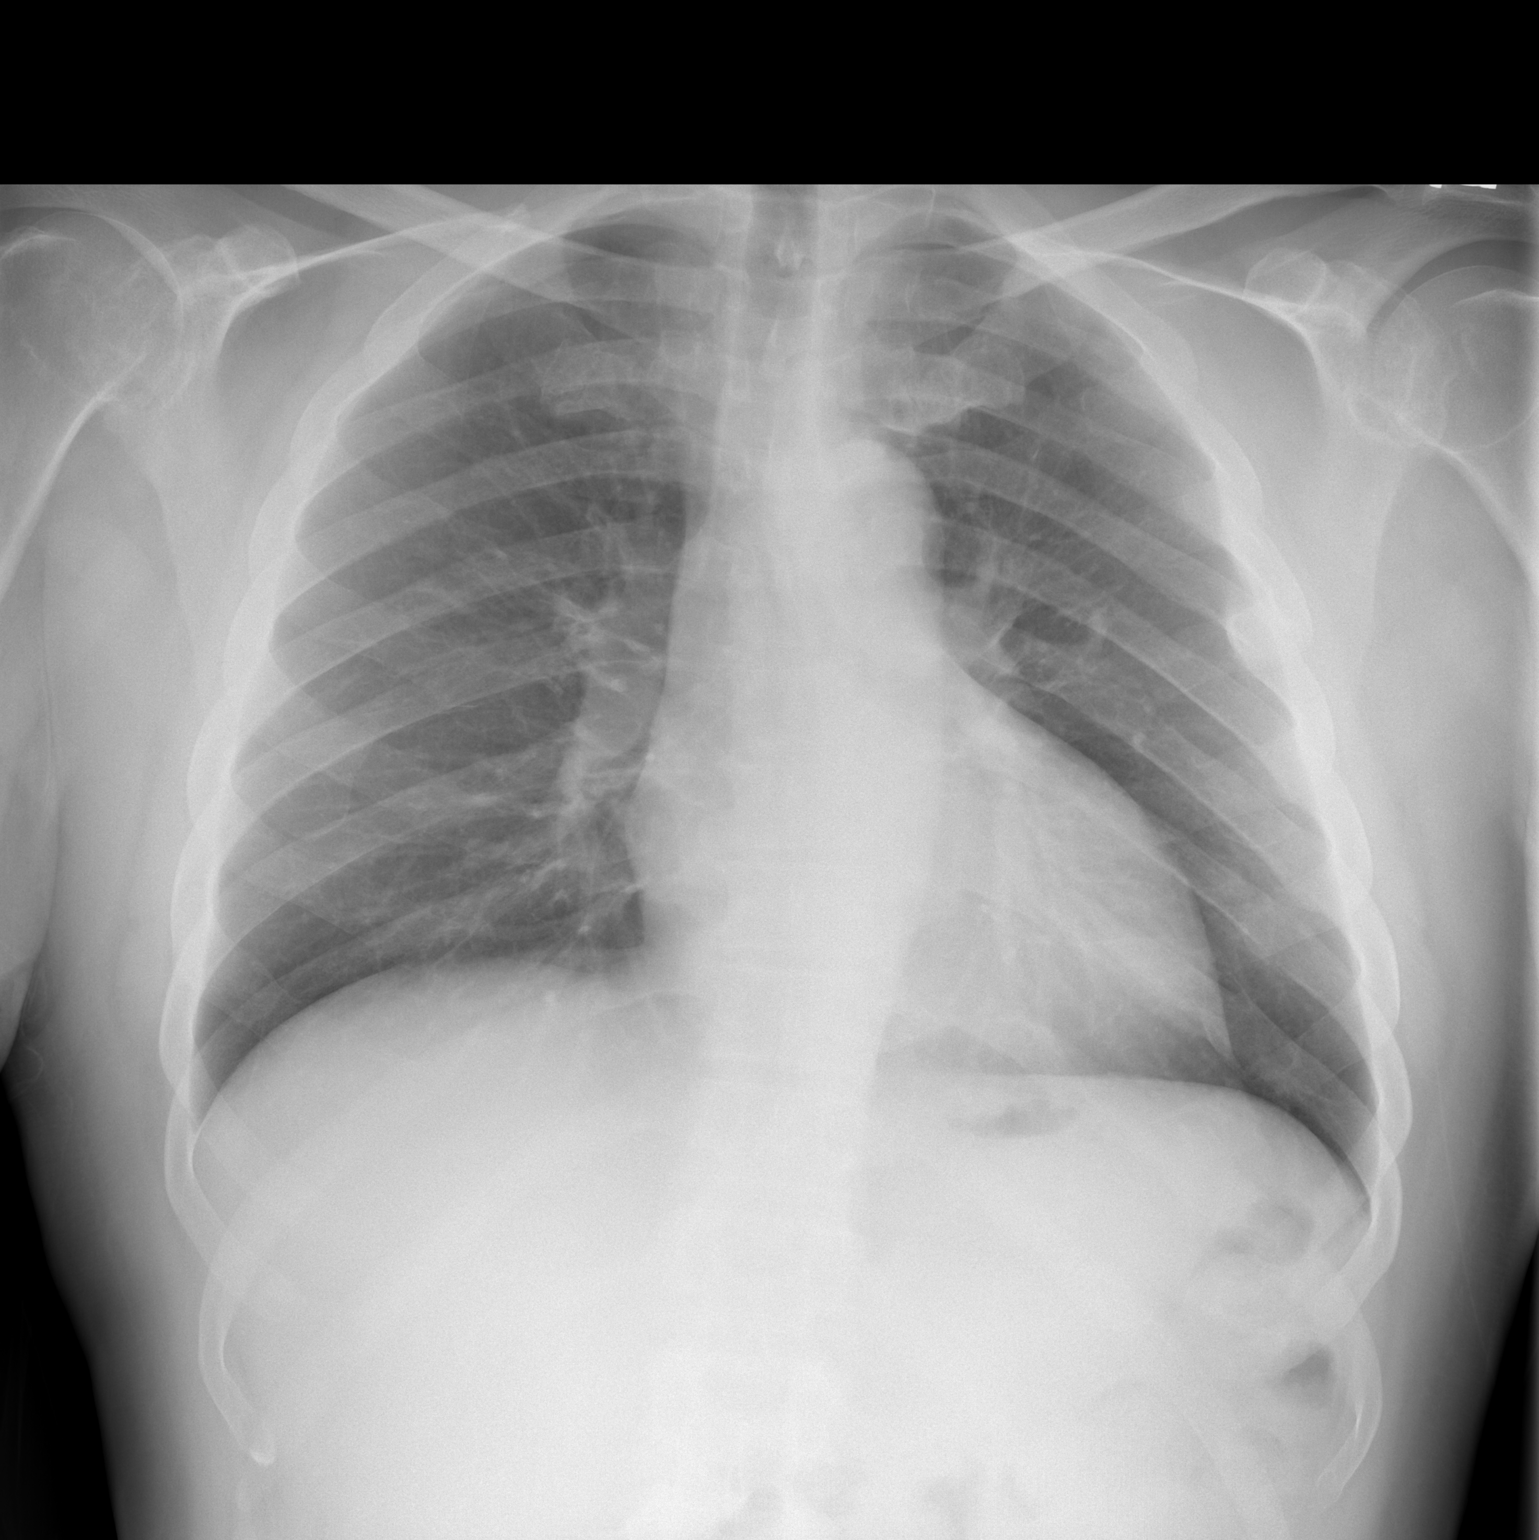

[w chest lat]
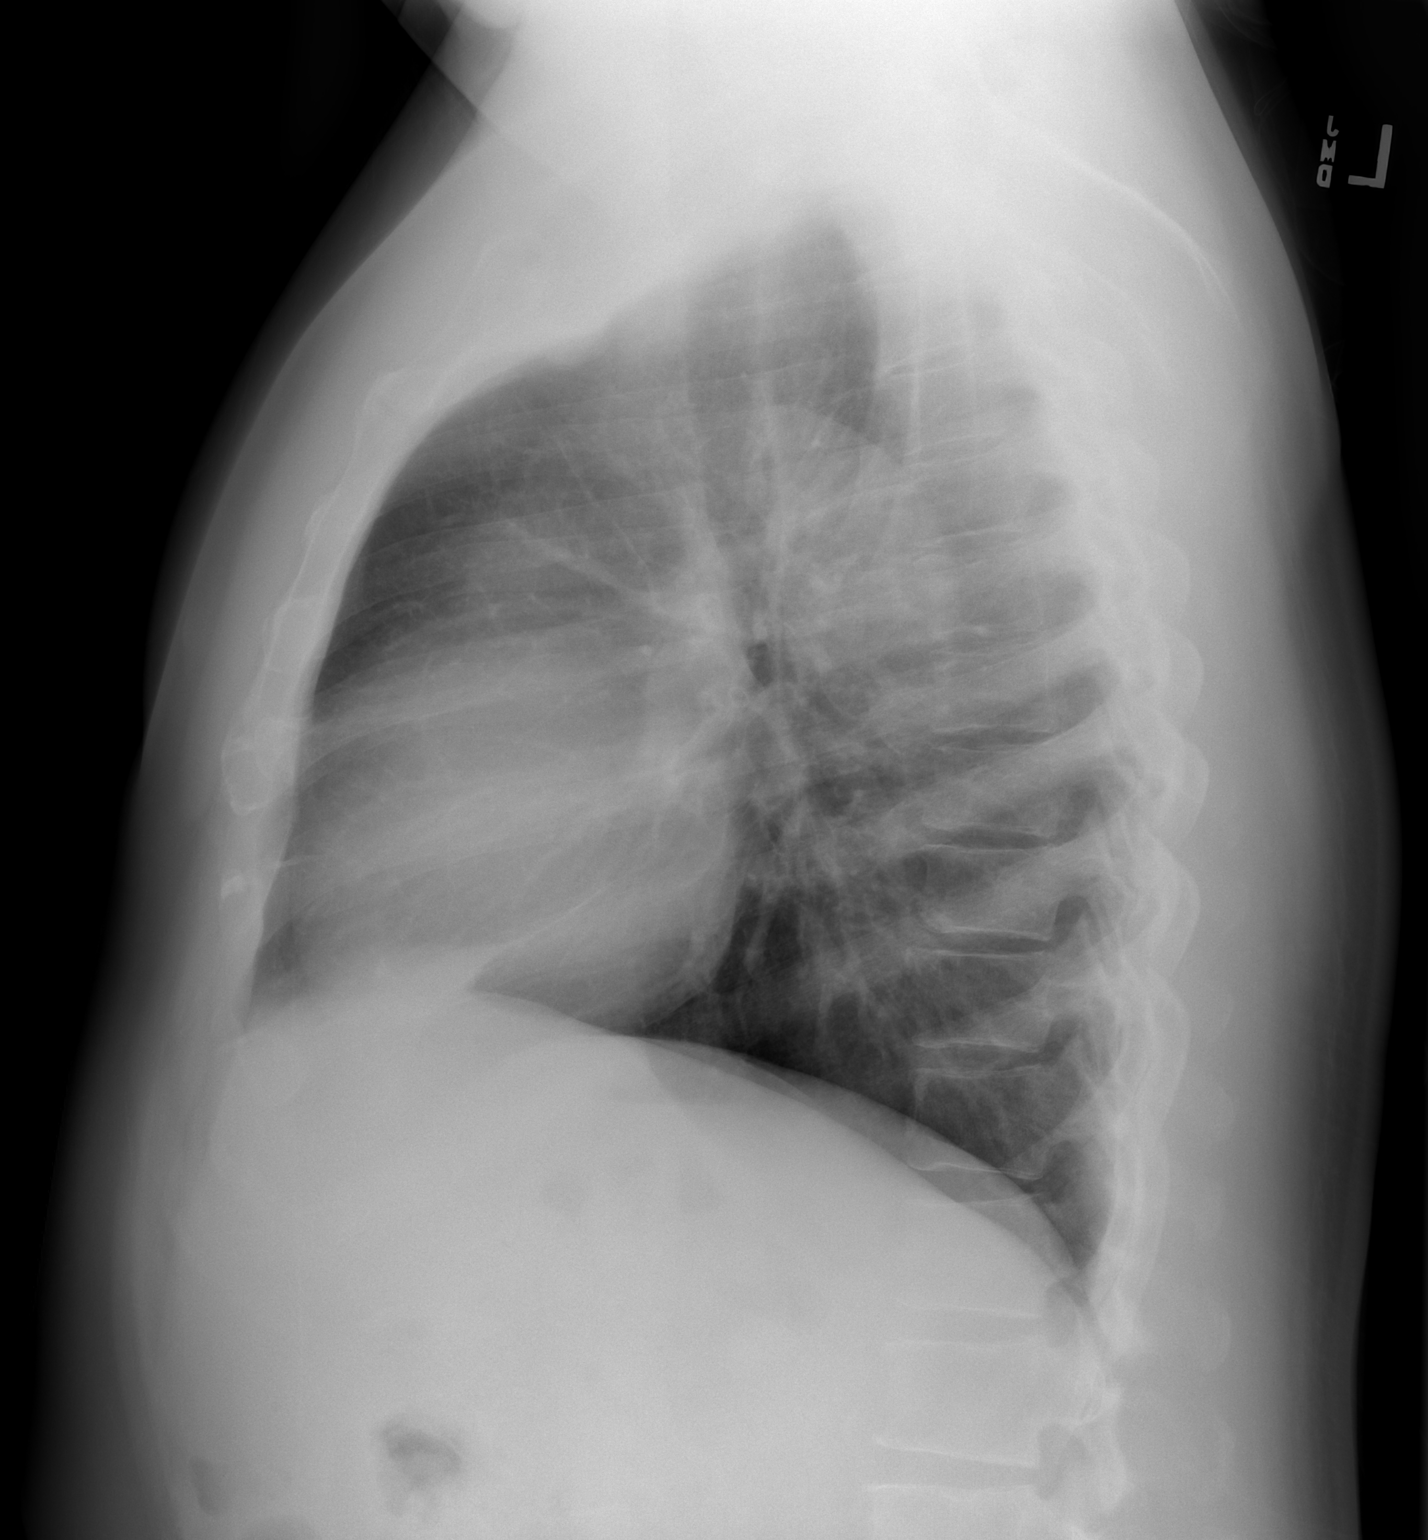

[2 of 2 positions shown; findings below may reference images not displayed]

FINDINGS: The heart size and mediastinal contours are within normal limits.
Both lungs are clear. The visualized skeletal structures are
unremarkable.
IMPRESSION: No active cardiopulmonary disease.

## 2020-03-24 ENCOUNTER — Other Ambulatory Visit: Payer: Self-pay | Admitting: Family Medicine

## 2020-03-24 DIAGNOSIS — I10 Essential (primary) hypertension: Secondary | ICD-10-CM

## 2020-03-24 MED FILL — LISINOPRIL-HCTZ 20-12.5 MG: 20-12.5 | 30 days supply | Qty: 30 | Fill #0

## 2020-03-24 MED FILL — metFORMIN HCL 1000 MG TABS: 1000 | 30 days supply | Qty: 60 | Fill #1

## 2020-03-24 MED FILL — JARDIANCE 10 MG TABLET: 10 | 30 days supply | Qty: 30 | Fill #3

## 2020-04-26 ENCOUNTER — Ambulatory Visit: Payer: Medicaid Other | Admitting: Family Medicine

## 2020-04-27 ENCOUNTER — Ambulatory Visit: Payer: Medicaid Other | Admitting: Family Medicine

## 2020-04-27 MED FILL — LISINOPRIL-HCTZ 20-12.5 MG: 20-12.5 | 30 days supply | Qty: 30 | Fill #1

## 2020-04-27 MED FILL — JARDIANCE 10 MG TABLET: 10 | 30 days supply | Qty: 30 | Fill #4

## 2020-04-27 MED FILL — metFORMIN HCL 1000 MG TABS: 1000 | 30 days supply | Qty: 60 | Fill #2

## 2020-05-06 ENCOUNTER — Ambulatory Visit: Payer: Medicaid Other | Admitting: Podiatry

## 2020-05-17 ENCOUNTER — Ambulatory Visit (INDEPENDENT_AMBULATORY_CARE_PROVIDER_SITE_OTHER): Payer: Medicaid Other | Admitting: Family Medicine

## 2020-05-17 ENCOUNTER — Encounter: Payer: Self-pay | Admitting: Family Medicine

## 2020-05-17 ENCOUNTER — Other Ambulatory Visit: Payer: Self-pay

## 2020-05-17 VITALS — BP 150/70 | HR 75 | Temp 98.2°F | Ht 69.0 in | Wt 221.6 lb

## 2020-05-17 DIAGNOSIS — N529 Male erectile dysfunction, unspecified: Secondary | ICD-10-CM

## 2020-05-17 DIAGNOSIS — R7309 Other abnormal glucose: Secondary | ICD-10-CM | POA: Diagnosis not present

## 2020-05-17 DIAGNOSIS — R1084 Generalized abdominal pain: Secondary | ICD-10-CM | POA: Diagnosis not present

## 2020-05-17 DIAGNOSIS — Z96651 Presence of right artificial knee joint: Secondary | ICD-10-CM | POA: Diagnosis not present

## 2020-05-17 DIAGNOSIS — Z09 Encounter for follow-up examination after completed treatment for conditions other than malignant neoplasm: Secondary | ICD-10-CM

## 2020-05-17 DIAGNOSIS — I10 Essential (primary) hypertension: Secondary | ICD-10-CM | POA: Diagnosis not present

## 2020-05-17 DIAGNOSIS — E1169 Type 2 diabetes mellitus with other specified complication: Secondary | ICD-10-CM

## 2020-05-17 LAB — POCT URINALYSIS DIPSTICK
Bilirubin, UA: NEGATIVE
Blood, UA: NEGATIVE
Glucose, UA: POSITIVE — AB
Ketones, UA: NEGATIVE
Leukocytes, UA: NEGATIVE
Nitrite, UA: NEGATIVE
Protein, UA: NEGATIVE
Spec Grav, UA: 1.02 (ref 1.010–1.025)
Urobilinogen, UA: 0.2 E.U./dL
pH, UA: 6 (ref 5.0–8.0)

## 2020-05-17 LAB — GLUCOSE, POCT (MANUAL RESULT ENTRY): POC Glucose: 128 mg/dl — AB (ref 70–99)

## 2020-05-17 NOTE — Progress Notes (Signed)
Patient [facility]  Established Patient Office Visit  Subjective:  Patient ID: Kyle Merritt, male    DOB: Aug 28, 1961  Age: 59 y.o. MRN: [id number]  CC:  Chief Complaint  Patient presents with  . Follow-up    HPI Kyle Merritt is a 59 year old male who presents for Follow Up today.   Past Medical History:  Diagnosis Date  . Arthritis   . Colon cancer ([place])   . Diabetes mellitus without complication ([place])   . High cholesterol   . History of colon cancer   . History of colon cancer 2004  . Hypertension    Patient Active Problem List   Diagnosis Date Noted  . Elevated sed rate 01/07/2020  . Elevated C-reactive protein (CRP) 01/07/2020  . S/P TKR (total knee replacement), right 11/30/2019  . Osteoarthritis of right knee 11/26/2019  . Constipation 10/06/2018  . Olecranon bursitis of left elbow 06/04/2018  . Trigger finger, acquired 06/04/2018  . Cervical disc disorder with radiculopathy of cervical region 05/08/2018  . Other chronic pain 02/04/2018  . History of cocaine use 02/04/2018  . Hyperlipidemia 02/04/2018  . Colon cancer screening 11/29/2014  . Knee pain, acute 10/04/2014  . Essential hypertension 08/02/2014  . Dental abscess 08/02/2014  . Diabetes ([place]) 04/29/2014  . HTN (hypertension) 04/29/2014  . Dyslipidemia 04/29/2014  . History of colon cancer 04/29/2014  . Back pain 04/29/2014  . Erectile dysfunction 04/29/2014  . Tobacco abuse 07/27/2013  . Pure hypercholesterolemia 12/31/2008  . TESTOSTERONE DEFICIENCY 12/10/2008  . GERD 12/08/2008  . ONYCHOMYCOSIS, BILATERAL 11/09/2008  . DIABETES MELLITUS 11/09/2008  . Anemia 11/09/2008  . Malignant neoplasm of colon ([place]) 12/24/2002   Current Status: This will be Mr. Crill initial office visit with me. He is currently seeing Dionisio David, NP for his PCP needs. Since his last office visit, he is doing well with no complaints. His most recent normal range of preprandial  blood glucose levels have been between 130-135. He has seen low range of 119 and high of 200 since his last office visit. He denies fatigue, frequent urination, blurred vision, excessive hunger, excessive thirst, weight gain, weight loss, and poor wound healing. He continues to check his feet regularly. He denies visual changes, chest pain, cough, shortness of breath, heart palpitations, and falls. He has occasional headaches and dizziness with position changes. Denies severe headaches, confusion, seizures, double vision, and blurred vision, nausea and vomiting.He is s/p right knee surgery, which he occasionally takes Acetaminophen. He denies fevers, chills, recent infections, weight loss, and night sweats. No reports of GI problems such as diarrhea, and constipation. He has no reports of blood in stools, dysuria and hematuria. No depression or anxiety reported today. He denies suicidal ideations, homicidal ideations, or auditory hallucinations. He is taking all medications as prescribed. He denies pain today.   Past Surgical History:  Procedure Laterality Date  . COLON SURGERY    . KNEE ARTHROSCOPY Bilateral   . 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 TRIGGER FINGER RELEASE Right 05/22/2019    Family History  Problem Relation Age of Onset  . Diabetes Mother   . Hypertension Mother   . Colon cancer Paternal Uncle   . Esophageal cancer Neg Hx   . Inflammatory bowel disease Neg Hx   . Liver disease Neg Hx   . Pancreatic  cancer Neg Hx   . Stomach cancer Neg Hx     Social History   Socioeconomic History  . Marital status: Divorced    Spouse name: Not on file  . Number of children: Not on file  . Years of education: Not on file  . Highest education level: Not on file  Occupational History  . Not on file  Tobacco Use  . Smoking status: Former Smoker     Quit date: 2018    Years since quitting: 3.3  . Smokeless tobacco: Never Used  Substance and Sexual Activity  . Alcohol use: No  . Drug use: No  . Sexual activity: Yes  Other Topics Concern  . Not on file  Social History Narrative  . Not on file   Social Determinants of Health   Financial Resource Strain:   . Difficulty of Paying Living Expenses:   Food Insecurity:   . Worried About Charity fundraiser in the Last Year:   . Arboriculturist in the Last Year:   Transportation Needs:   . Film/video editor (Medical):   [name] Lack of Transportation (Non-Medical):   Physical Activity:   . Days of Exercise per Week:   . Minutes of Exercise per Session:   Stress:   . Feeling of Stress :   Social Connections:   . Frequency of Communication with Friends and Family:   . Frequency of Social Gatherings with Friends and Family:   . Attends Religious Services:   . Active Member of Clubs or Organizations:   . Attends Archivist Meetings:   [name] Marital Status:   Intimate Partner Violence:   . Fear of Current or Ex-Partner:   . Emotionally Abused:   [name] Physically Abused:   . Sexually Abused:     Outpatient Medications Prior to Visit  Medication Sig Dispense Refill  . ACCU-CHEK GUIDE test strip USE AS DIRECTED UP TO 4 TIMES DAILY. 100 each 0  . atorvastatin (LIPITOR) 40 MG tablet Take 1 tablet (40 mg total) by mouth daily. (Patient taking differently: Take 40 mg by mouth every evening. ) 90 tablet 3  . blood glucose meter kit and supplies KIT Dispense based on patient and insurance preference. Use up to four times daily as directed. (FOR ICD-10  E11.9). 1 each 0  . JARDIANCE 10 MG TABS tablet TAKE 1 TABLET BY MOUTH DAILY. 30 tablet 5  . lisinopril-hydrochlorothiazide (ZESTORETIC) 20-12.5 MG tablet TAKE 1 TABLET BY MOUTH DAILY. 30 tablet 3  . metFORMIN (GLUCOPHAGE) 1000 MG tablet TAKE 1 TABLET (1,000 MG TOTAL) BY MOUTH 2 (TWO) TIMES DAILY WITH A MEAL. 60 tablet 5  . sildenafil  (VIAGRA) 100 MG tablet TAKE 1 TABLET BY MOUTH DAILY AS NEEDED FOR  ERECTILE  DYSFUNCTION 10 tablet 0  . aspirin EC 81 MG tablet Take 1 tablet (81 mg total) by mouth 2 (two) times daily. (Patient not taking: Reported on 05/17/2020) 60 tablet 0  . betamethasone acetate-betamethasone sodium phosphate (CELESTONE) 6 (3-3) MG/ML injection Inject 1 unit    10CC (EQUAL PARTS BETAMETHASONE, MARCAINE, AND LIDOCAINE) INTO LEFT KNEE JOINT.    . Dulaglutide (TRULICITY) 1.74 YC/1.4GY SOPN Inject 0.75 mg into the skin once a week. (Patient not taking: Reported on 01/06/2020) 4 pen 5  . tiZANidine (ZANAFLEX) 2 MG tablet Take 1 tablet (2 mg total) by mouth every 6 (six) hours as needed. (Patient not taking: Reported on 05/17/2020) 60 tablet 0  . traMADol (ULTRAM) 50 MG  tablet Take 50 mg by mouth every 6 (six) hours as needed. Given by dr. Malva Cogan for knee surgery.      Facility-Administered Medications Prior to Visit  Medication Dose Route Frequency Provider Last Rate Last Admin  . 0.9 %  sodium chloride infusion  500 mL Intravenous Once Mansouraty, Telford Nab., MD        Allergies  Allergen Reactions  . Crab [Shellfish Allergy]     itching    ROS Review of Systems  Constitutional: Negative.   HENT: Negative.   Eyes: Negative.   Respiratory: Negative.   Cardiovascular: Negative.   Gastrointestinal: Negative.   Endocrine: Negative.   Genitourinary: Negative.   Musculoskeletal: Positive for arthralgias (generalized joint pain).  Skin: Negative.   Allergic/Immunologic: Negative.   Neurological: Positive for dizziness (occasional ) and headaches (occasional ).  Hematological: Negative.   Psychiatric/Behavioral: Negative.    Objective:    Physical Exam  Constitutional: He is oriented to person, place, and time. He appears well-developed and well-nourished.  HENT:  Head: Normocephalic and atraumatic.  Eyes: Conjunctivae are normal.  Cardiovascular: Normal rate, regular rhythm, normal heart sounds and  intact distal pulses.  Pulmonary/Chest: Effort normal and breath sounds normal.  Abdominal: Soft. Bowel sounds are normal.  Musculoskeletal:     Cervical back: Normal range of motion and neck supple.     Comments: Mild limited ROM noted in in right knee r/t recent surgery.   Neurological: He is alert and oriented to person, place, and time. He has normal reflexes.  Skin: Skin is warm and dry.  Psychiatric: He has a normal mood and affect. His behavior is normal. Judgment and thought content normal.  Nursing note and vitals reviewed.   BP (!) 150/70   Pulse 75   Temp 98.2 F (36.8 C)   Ht 5' 9"  (1.753 m)   Wt 221 lb 9.6 oz (100.5 kg)   SpO2 100%   BMI 32.72 kg/m  Wt Readings from Last 3 Encounters:  05/17/20 221 lb 9.6 oz (100.5 kg)  01/29/20 215 lb (97.5 kg)  01/11/20 204 lb (92.5 kg)     Health Maintenance Due  Topic Date Due  . COVID-19 Vaccine (1) Never done  . OPHTHALMOLOGY EXAM  07/24/2019    There are no preventive care reminders to display for this patient.  Lab Results  Component Value Date   TSH 1.640 01/06/2020   Lab Results  Component Value Date   WBC 8.0 01/11/2020   HGB 8.9 (L) 01/11/2020   HCT 28.7 (L) 01/11/2020   MCV 78.6 (L) 01/11/2020   PLT 678 (H) 01/11/2020   Lab Results  Component Value Date   NA 140 01/29/2020   K 4.5 01/29/2020   CO2 25 01/11/2020   GLUCOSE 149 (H) 01/29/2020   BUN 15 01/29/2020   CREATININE 1.18 01/29/2020   BILITOT 0.5 01/29/2020   ALKPHOS 121 (H) 01/29/2020   AST 8 01/29/2020   ALT 9 01/11/2020   PROT 7.2 01/29/2020   ALBUMIN 3.9 01/29/2020   CALCIUM 9.8 01/29/2020   ANIONGAP 13 01/11/2020   Lab Results  Component Value Date   CHOL 115 01/06/2020   Lab Results  Component Value Date   HDL 41 01/06/2020   Lab Results  Component Value Date   LDLCALC 56 01/06/2020   Lab Results  Component Value Date   TRIG 92 01/06/2020   Lab Results  Component Value Date   CHOLHDL 2.8 01/06/2020   Lab Results  Component Value Date   HGBA1C 6.6 (A) 01/06/2020   Assessment & Plan:   1. Essential hypertension The current medical regimen is effective; blood pressure is stable today; continue present plan and medications as prescribed. He will continue to take medications as prescribed, to decrease high sodium intake, excessive alcohol intake, increase potassium intake, smoking cessation, and increase physical activity of at least 30 minutes of cardio activity daily. He will continue to follow Heart Healthy or DASH diet. - POCT urinalysis dipstick  2. Type 2 diabetes mellitus with other specified complication, without long-term current use of insulin ([facility]) He will continue medication as prescribed, to decrease foods/beverages high in sugars and carbs and follow Heart Healthy or DASH diet. Increase physical activity to at least 30 minutes cardio exercise daily.  - POCT glucose (manual entry)  3. Hemoglobin A1c less than 7.0% Hgb A1c is stable at 6.6 today. Monitor.   4. Erectile dysfunction, unspecified erectile dysfunction type  5. S/P TKR (total knee replacement), right Stable. Well-healed scar. Mild pain.   6. Generalized abdominal pain Results are pending.  - H. pylori breath test  7. Follow up He will follow up in 6 months.   No orders of the defined types were placed in this encounter.   Orders Placed This Encounter  Procedures  . H. pylori breath test  . POCT urinalysis dipstick  . POCT glucose (manual entry)    Referral Orders  No referral(s) requested today    Kathe Becton,  MSN, FNP-BC [place],  [id number] [phone] fax   Problem List Items Addressed This Visit      Cardiovascular and Mediastinum   Essential hypertension - Primary   Relevant Orders   POCT urinalysis dipstick (Completed)     Endocrine   Diabetes ([place])   Relevant Orders    POCT glucose (manual entry) (Completed)     Other   Erectile dysfunction   S/P TKR (total knee replacement), right    Other Visit Diagnoses    Hemoglobin A1c less than 7.0%       Generalized abdominal pain       Relevant Orders   H. pylori breath test   Follow up          No orders of the defined types were placed in this encounter.   Follow-up: Return in about 6 months (around 11/17/2020).    Azzie Glatter, FNP

## 2020-05-19 DIAGNOSIS — R1084 Generalized abdominal pain: Secondary | ICD-10-CM | POA: Insufficient documentation

## 2020-05-19 DIAGNOSIS — R7309 Other abnormal glucose: Secondary | ICD-10-CM | POA: Insufficient documentation

## 2020-05-19 LAB — H. PYLORI BREATH TEST: H pylori Breath Test: POSITIVE — AB

## 2020-05-25 ENCOUNTER — Other Ambulatory Visit: Payer: Self-pay | Admitting: Family Medicine

## 2020-05-25 DIAGNOSIS — A048 Other specified bacterial intestinal infections: Secondary | ICD-10-CM

## 2020-05-26 MED ORDER — AMOXICILLIN 500 MG PO TABS: ORAL_TABLET | ORAL | 0 refills | Status: DC

## 2020-05-26 MED ORDER — OMEPRAZOLE 20 MG PO CPDR: 20.0000 mg | DELAYED_RELEASE_CAPSULE | Freq: Two times a day (BID) | ORAL | 0 refills | Status: DC

## 2020-05-26 MED ORDER — CLARITHROMYCIN 500 MG PO TABS: 500.0000 mg | ORAL_TABLET | Freq: Two times a day (BID) | ORAL | 0 refills | Status: DC

## 2020-05-26 MED FILL — AMOXICILLIN 500 MG CAPSULE: 500 | 14 days supply | Qty: 56 | Fill #0

## 2020-05-26 MED FILL — CLARITHROMYCIN 500 MG TAB: 500 | 14 days supply | Qty: 28 | Fill #0

## 2020-05-26 MED FILL — OMEPRAZOLE 20 MG CAP: 20 | 14 days supply | Qty: 28 | Fill #0

## 2020-05-27 ENCOUNTER — Other Ambulatory Visit: Payer: Self-pay | Admitting: Family Medicine

## 2020-05-30 DIAGNOSIS — M25522 Pain in left elbow: Secondary | ICD-10-CM | POA: Diagnosis not present

## 2020-07-01 ENCOUNTER — Other Ambulatory Visit: Payer: Self-pay | Admitting: Nurse Practitioner

## 2020-07-01 DIAGNOSIS — H04123 Dry eye syndrome of bilateral lacrimal glands: Secondary | ICD-10-CM | POA: Diagnosis not present

## 2020-07-01 DIAGNOSIS — H538 Other visual disturbances: Secondary | ICD-10-CM | POA: Diagnosis not present

## 2020-07-01 DIAGNOSIS — H25813 Combined forms of age-related cataract, bilateral: Secondary | ICD-10-CM | POA: Diagnosis not present

## 2020-07-01 DIAGNOSIS — E1169 Type 2 diabetes mellitus with other specified complication: Secondary | ICD-10-CM

## 2020-07-01 DIAGNOSIS — H35033 Hypertensive retinopathy, bilateral: Secondary | ICD-10-CM | POA: Diagnosis not present

## 2020-07-01 DIAGNOSIS — H11153 Pinguecula, bilateral: Secondary | ICD-10-CM | POA: Diagnosis not present

## 2020-07-01 MED FILL — LISINOPRIL-HCTZ 20-12.5 MG: 20-12.5 | 30 days supply | Qty: 30 | Fill #3

## 2020-07-04 MED FILL — JARDIANCE 10 MG TABLET: 10 | 30 days supply | Qty: 30 | Fill #0

## 2020-07-08 ENCOUNTER — Ambulatory Visit: Payer: Medicaid Other | Admitting: Podiatry

## 2020-07-29 ENCOUNTER — Other Ambulatory Visit: Payer: Self-pay | Admitting: Nurse Practitioner

## 2020-07-29 DIAGNOSIS — I10 Essential (primary) hypertension: Secondary | ICD-10-CM

## 2020-07-29 MED FILL — JARDIANCE 10 MG TABLET: 10 | 30 days supply | Qty: 30 | Fill #0

## 2020-07-29 MED FILL — metFORMIN HCL 1000 MG TABS: 1000 | 30 days supply | Qty: 60 | Fill #4

## 2020-07-29 MED FILL — LISINOPRIL-HCTZ 20-12.5 MG: 20-12.5 | 30 days supply | Qty: 30 | Fill #0

## 2020-08-15 ENCOUNTER — Ambulatory Visit (INDEPENDENT_AMBULATORY_CARE_PROVIDER_SITE_OTHER): Payer: Medicaid Other | Admitting: Nurse Practitioner

## 2020-08-15 ENCOUNTER — Other Ambulatory Visit: Payer: Self-pay | Admitting: Nurse Practitioner

## 2020-08-15 ENCOUNTER — Other Ambulatory Visit: Payer: Self-pay

## 2020-08-15 ENCOUNTER — Encounter: Payer: Self-pay | Admitting: Nurse Practitioner

## 2020-08-15 VITALS — BP 134/84 | HR 88 | Temp 97.7°F | Ht 69.0 in | Wt 218.0 lb

## 2020-08-15 DIAGNOSIS — E785 Hyperlipidemia, unspecified: Secondary | ICD-10-CM

## 2020-08-15 DIAGNOSIS — H5789 Other specified disorders of eye and adnexa: Secondary | ICD-10-CM | POA: Diagnosis not present

## 2020-08-15 DIAGNOSIS — K219 Gastro-esophageal reflux disease without esophagitis: Secondary | ICD-10-CM | POA: Diagnosis not present

## 2020-08-15 DIAGNOSIS — E1169 Type 2 diabetes mellitus with other specified complication: Secondary | ICD-10-CM

## 2020-08-15 DIAGNOSIS — I10 Essential (primary) hypertension: Secondary | ICD-10-CM | POA: Diagnosis not present

## 2020-08-15 DIAGNOSIS — R202 Paresthesia of skin: Secondary | ICD-10-CM

## 2020-08-15 LAB — POCT URINALYSIS DIPSTICK
Bilirubin, UA: NEGATIVE
Blood, UA: NEGATIVE
Glucose, UA: POSITIVE — AB
Ketones, UA: NEGATIVE
Leukocytes, UA: NEGATIVE
Nitrite, UA: NEGATIVE
Protein, UA: NEGATIVE
Spec Grav, UA: 1.02 (ref 1.010–1.025)
Urobilinogen, UA: 0.2 E.U./dL
pH, UA: 5.5 (ref 5.0–8.0)

## 2020-08-15 LAB — POCT GLYCOSYLATED HEMOGLOBIN (HGB A1C)
HbA1c POC (<> result, manual entry): 6.6 % (ref 4.0–5.6)
HbA1c, POC (controlled diabetic range): 6.6 % (ref 0.0–7.0)
HbA1c, POC (prediabetic range): 6.6 % — AB (ref 5.7–6.4)
Hemoglobin A1C: 6.6 % — AB (ref 4.0–5.6)

## 2020-08-15 MED ORDER — LISINOPRIL-HYDROCHLOROTHIAZIDE 20-12.5 MG PO TABS: 1.0000 | ORAL_TABLET | Freq: Every day | ORAL | 3 refills | Status: DC

## 2020-08-15 MED ORDER — OMEPRAZOLE 40 MG PO CPDR: 40.0000 mg | DELAYED_RELEASE_CAPSULE | Freq: Every day | ORAL | 3 refills | Status: DC

## 2020-08-15 MED ORDER — ATORVASTATIN CALCIUM 40 MG PO TABS: 40.0000 mg | ORAL_TABLET | Freq: Every evening | ORAL | 3 refills | Status: DC

## 2020-08-15 MED ORDER — METFORMIN HCL 1000 MG PO TABS: 1000.0000 mg | ORAL_TABLET | Freq: Two times a day (BID) | ORAL | 3 refills | Status: DC

## 2020-08-15 MED ORDER — EMPAGLIFLOZIN 10 MG PO TABS: 10.0000 mg | ORAL_TABLET | Freq: Every day | ORAL | 3 refills | Status: DC

## 2020-08-15 MED FILL — OMEPRAZOLE DR 40 MG CAPSULE: 40 | 90 days supply | Qty: 90 | Fill #0

## 2020-08-15 MED FILL — ATORVASTATIN CALCIUM 40 MG: 40 | 90 days supply | Qty: 90 | Fill #0

## 2020-08-15 NOTE — Patient Instructions (Signed)
Anemia  Anemia is a condition in which you do not have enough red blood cells or hemoglobin. Hemoglobin is a substance in red blood cells that carries oxygen. When you do not have enough red blood cells or hemoglobin (are anemic), your body cannot get enough oxygen and your organs may not work properly. As a result, you may feel very tired or have other problems. What are the causes? Common causes of anemia include:  Excessive bleeding. Anemia can be caused by excessive bleeding inside or outside the body, including bleeding from the intestine or from periods in women.  Poor nutrition.  Long-lasting (chronic) kidney, thyroid, and liver disease.  Bone marrow disorders.  Cancer and treatments for cancer.  HIV (human immunodeficiency virus) and AIDS (acquired immunodeficiency syndrome).  Treatments for HIV and AIDS.  Spleen problems.  Blood disorders.  Infections, medicines, and autoimmune disorders that destroy red blood cells. What are the signs or symptoms? Symptoms of this condition include:  Minor weakness.  Dizziness.  Headache.  Feeling heartbeats that are irregular or faster than normal (palpitations).  Shortness of breath, especially with exercise.  Paleness.  Cold sensitivity.  Indigestion.  Nausea.  Difficulty sleeping.  Difficulty concentrating. Symptoms may occur suddenly or develop slowly. If your anemia is mild, you may not have symptoms. How is this diagnosed? This condition is diagnosed based on:  Blood tests.  Your medical history.  A physical exam.  Bone marrow biopsy. Your health care provider may also check your stool (feces) for blood and may do additional testing to look for the cause of your bleeding. You may also have other tests, including:  Imaging tests, such as a CT scan or MRI.  Endoscopy.  Colonoscopy. How is this treated? Treatment for this condition depends on the cause. If you continue to lose a lot of blood, you may  need to be treated at a hospital. Treatment may include:  Taking supplements of iron, vitamin N46, or folic acid.  Taking a hormone medicine (erythropoietin) that can help to stimulate red blood cell growth.  Having a blood transfusion. This may be needed if you lose a lot of blood.  Making changes to your diet.  Having surgery to remove your spleen. Follow these instructions at home:  Take over-the-counter and prescription medicines only as told by your health care provider.  Take supplements only as told by your health care provider.  Follow any diet instructions that you were given.  Keep all follow-up visits as told by your health care provider. This is important. Contact a health care provider if:  You develop new bleeding anywhere in the body. Get help right away if:  You are very weak.  You are short of breath.  You have pain in your abdomen or chest.  You are dizzy or feel faint.  You have trouble concentrating.  You have bloody or black, tarry stools.  You vomit repeatedly or you vomit up blood. Summary  Anemia is a condition in which you do not have enough red blood cells or enough of a substance in your red blood cells that carries oxygen (hemoglobin).  Symptoms may occur suddenly or develop slowly.  If your anemia is mild, you may not have symptoms.  This condition is diagnosed with blood tests as well as a medical history and physical exam. Other tests may be needed.  Treatment for this condition depends on the cause of the anemia. This information is not intended to replace advice given to you by  your health care provider. Make sure you discuss any questions you have with your health care provider. Document Revised: 11/22/2017 Document Reviewed: 01/11/2017 Elsevier Patient Education  2020 Else[employer] Inc.   

## 2020-08-15 NOTE — Progress Notes (Signed)
Kyle Merritt, [place]  [address] Phone:  [phone]   Fax:  [phone]   Established Patient Office Visit  Subjective:  Patient ID: Kyle Merritt, male    DOB: [birth year]  Age: 59 y.o. MRN: [id number]  CC:  Chief Complaint  Patient presents with   Follow-up    HPI Kyle Merritt presents for follow up. He  has a past medical history of Arthritis, Colon cancer ([place]), Diabetes mellitus without complication ([place]), High cholesterol, History of colon cancer, History of colon cancer (2004), and Hypertension.   Diabetes Mellitus Patient presents for follow up of diabetes. Current symptoms include: visual disturbances and parathesia to right finger tips. Symptoms have stabilized. Patient denies foot ulcerations, hyperglycemia, hypoglycemia , increased appetite, nausea, paresthesia of the feet, polydipsia, polyuria, vomiting and weight loss. Evaluation to date has included: fasting blood sugar, fasting lipid panel, hemoglobin A1C and microalbuminuria.  Home sugars: BGs consistently in an acceptable range. Current treatment: Continued metformin which has been effective, Continued statin which has been effective, Continued ACE inhibitor/ARB which has been effective and Continued Empagliflozin which has been effective. Last dilated eye exam: 06/2020.He is getting ready to do preop right cataract removal.  He denies any diagnosis of retinopathy  Hypertension Patient is here for follow-up of elevated blood pressure. He is not exercising and is adherent to a low-salt diet. Blood pressure is not monitored at home. Cardiac symptoms: none. Patient denies chest pain, dyspnea, exertional chest pressure/discomfort, fatigue, irregular heart beat, lower extremity edema, palpitations and syncope. Cardiovascular risk factors: advanced age (older than 43 for men, 38 for women), diabetes mellitus, dyslipidemia, hypertension, male gender, obesity (BMI >= 30 kg/m2) and sedentary  lifestyle. Use of agents associated with hypertension: none. History of target organ [place]  He is also being following up on elevated cholesterol. Compliance with treatment has been excellent. The patient exercises occasionally. Patient denies muscle pain associated with his medications.   Past Medical History:  Diagnosis Date   Arthritis    Colon cancer ([place])    Diabetes mellitus without complication ([place])    High cholesterol    History of colon cancer    History of colon cancer 2004   Hypertension     Past Surgical History:  Procedure Laterality Date   COLON SURGERY     KNEE ARTHROSCOPY Bilateral    TOTAL KNEE ARTHROPLASTY Right 11/30/2019   Procedure: RIGHT TOTAL KNEE ARTHROPLASTY;  Surgeon: Frederik Pear, MD;  Location: [facility];  Service: Orthopedics;  Laterality: Right;   TRIGGER FINGER RELEASE Right 05/22/2019    Family History  Problem Relation Age of Onset   Diabetes Mother    Hypertension Mother    Colon cancer Paternal Uncle    Esophageal cancer Neg Hx    Inflammatory bowel disease Neg Hx    Liver disease Neg Hx    Pancreatic cancer Neg Hx    Stomach cancer Neg Hx     Social History   Socioeconomic History   Marital status: Divorced    Spouse name: Not on file   Number of children: Not on file   Years of education: Not on file   Highest education level: Not on file  Occupational History   Not on file  Tobacco Use   Smoking status: Former Smoker    Quit date: 2018    Years since quitting: 3.6   Smokeless tobacco: Never Used  Scientific laboratory technician Use: Never used  Substance  and Sexual Activity   Alcohol use: No   Drug use: No   Sexual activity: Yes  Other Topics Concern   Not on file  Social History Narrative   Not on file   Social Determinants of Health   Financial Resource Strain:    Difficulty of Paying Living Expenses: Not on file  Food Insecurity:    Worried About [place] in the Last Year: Not on  file   Ran Out of Food in the Last Year: Not on file  Transportation Needs:    Lack of Transportation (Medical): Not on file   Lack of Transportation (Non-Medical): Not on file  Physical Activity:    Days of Exercise per Week: Not on file   Minutes of Exercise per Session: Not on file  Stress:    Feeling of Stress : Not on file  Social Connections:    Frequency of Communication with Friends and Family: Not on file   Frequency of Social Gatherings with Friends and Family: Not on file   Attends Religious Services: Not on file   Active Member of Clubs or Organizations: Not on file   Attends Archivist Meetings: Not on file   Marital Status: Not on file  Intimate Partner Violence:    Fear of Current or Ex-Partner: Not on file   Emotionally Abused: Not on file   Physically Abused: Not on file   Sexually Abused: Not on file    Outpatient Medications Prior to Visit  Medication Sig Dispense Refill   ACCU-CHEK GUIDE test strip USE AS DIRECTED UP TO 4 TIMES DAILY. 100 each 0   blood glucose meter kit and supplies KIT Dispense based on patient and insurance preference. Use up to four times daily as directed. (FOR ICD-10  E11.9). 1 each 0   sildenafil (VIAGRA) 100 MG tablet TAKE 1 TABLET BY MOUTH DAILY AS NEEDED FOR  ERECTILE  DYSFUNCTION 10 tablet 0   atorvastatin (LIPITOR) 40 MG tablet Take 1 tablet (40 mg total) by mouth daily. (Patient taking differently: Take 40 mg by mouth every evening. ) 90 tablet 3   JARDIANCE 10 MG TABS tablet TAKE 1 TABLET BY MOUTH DAILY. 30 tablet 5   lisinopril-hydrochlorothiazide (ZESTORETIC) 20-12.5 MG tablet TAKE 1 TABLET BY MOUTH DAILY. 30 tablet 3   metFORMIN (GLUCOPHAGE) 1000 MG tablet TAKE 1 TABLET (1,000 MG TOTAL) BY MOUTH 2 (TWO) TIMES DAILY WITH A MEAL. 60 tablet 5   amoxicillin (AMOXIL) 500 MG tablet Take 2 capsules (1,000 mg), by mouth, 2 times a day X 14 days. 56 tablet 0   omeprazole (PRILOSEC) 20 MG capsule Take 1  capsule (20 mg total) by mouth 2 (two) times daily before a meal for 14 days. 28 capsule 0   Facility-Administered Medications Prior to Visit  Medication Dose Route Frequency Provider Last Rate Last Admin   0.9 %  sodium chloride infusion  500 mL Intravenous Once Mansouraty, Telford Nab., MD        Allergies  Allergen Reactions   Otho Darner Allergy]     itching    ROS Review of Systems  Constitutional: Negative.   HENT: Negative.   Eyes: Positive for visual disturbance.       Right eye cataract surgery pending  Respiratory: Negative.   Cardiovascular: Negative.   Gastrointestinal: Negative.        Abdominal discomfort  Endocrine: Negative.   Genitourinary:       Nocturia 2-3  Musculoskeletal: Negative.  Skin: Negative.   Neurological: Positive for dizziness and numbness.       Right hand fingers  Hematological: Negative.   Psychiatric/Behavioral: Negative.       Objective:    Physical Exam Constitutional:      General: He is not in acute distress.    Appearance: He is not ill-appearing, toxic-appearing or diaphoretic.  HENT:     Head: Normocephalic and atraumatic.     Nose: Nose normal.     Mouth/Throat:     Mouth: Mucous membranes are moist.  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 Rhythm irregular.     Pulses: Normal pulses.     Comments: Regular irregularity in aortic valve  Pulmonary:     Effort: Pulmonary effort is normal.     Breath sounds: Normal breath sounds.  Abdominal:     General: Bowel sounds are normal.     Palpations: Abdomen is soft.     Tenderness: There is no right CVA tenderness or left CVA tenderness.  Musculoskeletal:        General: Normal range of motion.     Cervical back: Normal range of motion.  Skin:    General: Skin is warm.     Capillary Refill: Capillary refill takes less than 2 seconds.  Neurological:     General: No focal deficit present.      Mental Status: He is alert and oriented to person, place, and time.  Psychiatric:        Mood and Affect: Mood normal.        Behavior: Behavior normal.        Thought Content: Thought content normal.        Judgment: Judgment normal.     BP 134/84    Pulse 88    Temp 97.7 F (36.5 C) (Temporal)    Ht 5' 9"  (1.753 m)    Wt 218 lb (98.9 kg)    SpO2 100%    BMI 32.19 kg/m  Wt Readings from Last 3 Encounters:  08/15/20 218 lb (98.9 kg)  05/17/20 221 lb 9.6 oz (100.5 kg)  01/29/20 215 lb (97.5 kg)     There are no preventive care reminders to display for this patient.  There are no preventive care reminders to display for this patient.  Lab Results  Component Value Date   TSH 1.640 01/06/2020   Lab Results  Component Value Date   WBC 8.0 01/11/2020   HGB 8.9 (L) 01/11/2020   HCT 28.7 (L) 01/11/2020   MCV 78.6 (L) 01/11/2020   PLT 678 (H) 01/11/2020   Lab Results  Component Value Date   NA 140 01/29/2020   K 4.5 01/29/2020   CO2 25 01/11/2020   GLUCOSE 149 (H) 01/29/2020   BUN 15 01/29/2020   CREATININE 1.18 01/29/2020   BILITOT 0.5 01/29/2020   ALKPHOS 121 (H) 01/29/2020   AST 8 01/29/2020   ALT 9 01/11/2020   PROT 7.2 01/29/2020   ALBUMIN 3.9 01/29/2020   CALCIUM 9.8 01/29/2020   ANIONGAP 13 01/11/2020   Lab Results  Component Value Date   CHOL 115 01/06/2020   Lab Results  Component Value Date   HDL 41 01/06/2020   Lab Results  Component Value Date   LDLCALC 56 01/06/2020   Lab Results  Component Value Date   TRIG 92 01/06/2020   Lab Results  Component Value Date  CHOLHDL 2.8 01/06/2020   Lab Results  Component Value Date   HGBA1C 6.6 (A) 08/15/2020   HGBA1C 6.6 08/15/2020   HGBA1C 6.6 (A) 08/15/2020   HGBA1C 6.6 08/15/2020      Assessment & Plan:   Problem List Items Addressed This Visit      Cardiovascular and Mediastinum   Essential hypertension Encouraged on going compliance with current medication regimen Encouraged home  monitoring and recording BP <130/80 Eating a heart-healthy diet with less salt Encouraged regular physical activity  Recommend Weight loss      Relevant Medications   lisinopril-hydrochlorothiazide (ZESTORETIC) 20-12.5 MG tablet   atorvastatin (LIPITOR) 40 MG tablet     Endocrine   Diabetes ([place]) - Primary Encourage continued compliance with current treatment regimen.  Current A1c at goal. Encourage regular CBG monitoring Encourage contacting office if excessive hyperglycemia and or hypoglycemia Lifestyle modification with healthy diet (fewer calories, more high fiber foods, whole grains and non-starchy vegetables, lower fat meat and fish, low-fat diary include healthy oils) regular exercise (physical activity) and weight loss Regular dental visits encouraged Home BP monitoring also encouraged goal <130/80     Relevant Medications   empagliflozin (JARDIANCE) 10 MG TABS tablet   lisinopril-hydrochlorothiazide (ZESTORETIC) 20-12.5 MG tablet   metFORMIN (GLUCOPHAGE) 1000 MG tablet   atorvastatin (LIPITOR) 40 MG tablet   Other Relevant Orders   POCT HgB A1C (Completed)   POCT urinalysis dipstick (Completed)   Comp. Metabolic Panel (12)     Other   Hyperlipidemia  Continue dietary measures.  Continue regular exercise. Lipid-lowering medications: Atorvastatin 69m QHS   Relevant Medications   lisinopril-hydrochlorothiazide (ZESTORETIC) 20-12.5 MG tablet   atorvastatin (LIPITOR) 40 MG tablet   Other Relevant Orders   Lipid panel    Other Visit Diagnoses  Gastroesophageal reflux disease without esophagitis Discussed dietary changes Retrial omeprazole with an increase omeprazole 40 mg daily encourage patient to use 30 minutes before breakfast    Red eye     Follow up with opthalmology as scheduled   Right hand paresthesia  Discussed glycemic control If persist or worsen will evaluate for cervical components      Meds ordered this encounter  Medications   omeprazole  (PRILOSEC) 40 MG capsule    Sig: Take 1 capsule (40 mg total) by mouth daily.    Dispense:  90 capsule    Refill:  3    Order Specific Question:   Supervising Provider    Answer:   JTresa Garter[[[id number]]  empagliflozin (JARDIANCE) 10 MG TABS tablet    Sig: Take 1 tablet (10 mg total) by mouth daily.    Dispense:  90 tablet    Refill:  3    Order Specific Question:   Supervising Provider    Answer:   JTresa Garter[[[id number]]  lisinopril-hydrochlorothiazide (ZESTORETIC) 20-12.5 MG tablet    Sig: Take 1 tablet by mouth daily.    Dispense:  90 tablet    Refill:  3    Order Specific Question:   Supervising Provider    Answer:   JTresa Garter[[[id number]]  metFORMIN (GLUCOPHAGE) 1000 MG tablet    Sig: Take 1 tablet (1,000 mg total) by mouth 2 (two) times daily with a meal.    Dispense:  180 tablet    Refill:  3    Order Specific Question:   Supervising Provider    Answer:   JTresa Garter[[[id number]]  atorvastatin (LIPITOR) 40 MG tablet  Sig: Take 1 tablet (40 mg total) by mouth every evening.    Dispense:  90 tablet    Refill:  3    Order Specific Question:   Supervising Provider    Answer:   Tresa Garter [[id number]]    Follow-up: Return in about 3 months (around 11/15/2020) for Please schedule nurse visit .     Vevelyn Francois, NP

## 2020-08-16 LAB — COMP. METABOLIC PANEL (12)
AST: 12 IU/L (ref 0–40)
Albumin/Globulin Ratio: 1.5 (ref 1.2–2.2)
Albumin: 4.4 g/dL (ref 3.8–4.8)
Alkaline Phosphatase: 106 IU/L (ref 48–121)
BUN/Creatinine Ratio: 14 (ref 10–24)
BUN: 21 mg/dL (ref 8–27)
Bilirubin Total: 0.8 mg/dL (ref 0.0–1.2)
Calcium: 9.9 mg/dL (ref 8.6–10.2)
Chloride: 98 mmol/L (ref 96–106)
Creatinine, Ser: 1.45 mg/dL — ABNORMAL HIGH (ref 0.76–1.27)
GFR calc Af Amer: 60 mL/min/{1.73_m2} (ref >59)
GFR calc non Af Amer: 52 mL/min/{1.73_m2} — ABNORMAL LOW (ref >59)
Globulin, Total: 2.9 g/dL (ref 1.5–4.5)
Glucose: 123 mg/dL — ABNORMAL HIGH (ref 65–99)
Potassium: 4.7 mmol/L (ref 3.5–5.2)
Sodium: 138 mmol/L (ref 134–144)
Total Protein: 7.3 g/dL (ref 6.0–8.5)

## 2020-08-16 LAB — LIPID PANEL
Chol/HDL Ratio: 3 ratio (ref 0.0–5.0)
Cholesterol, Total: 199 mg/dL (ref 100–199)
HDL: 66 mg/dL (ref >39)
LDL Chol Calc (NIH): 116 mg/dL — ABNORMAL HIGH (ref 0–99)
Triglycerides: 96 mg/dL (ref 0–149)
VLDL Cholesterol Cal: 17 mg/dL (ref 5–40)

## 2020-08-18 ENCOUNTER — Other Ambulatory Visit: Payer: Self-pay

## 2020-08-18 ENCOUNTER — Ambulatory Visit (INDEPENDENT_AMBULATORY_CARE_PROVIDER_SITE_OTHER): Payer: Medicaid Other | Admitting: Nurse Practitioner

## 2020-08-18 ENCOUNTER — Other Ambulatory Visit: Payer: Self-pay | Admitting: Family Medicine

## 2020-08-18 DIAGNOSIS — I499 Cardiac arrhythmia, unspecified: Secondary | ICD-10-CM | POA: Diagnosis not present

## 2020-08-18 DIAGNOSIS — N529 Male erectile dysfunction, unspecified: Secondary | ICD-10-CM

## 2020-08-18 NOTE — Progress Notes (Signed)
   [place], [place]  [address] Phone:  [phone]   Fax:  [phone]  Adult ECG Report   Name: Kyle Merritt  Age: 59 y.o.  Gender: male    Rate:74  Rhythm: normal sinus rhythm and premature ventricular contractions (PVC)  QRS Axis: 102  PR Interval:190  QRS Duration: 406  QTc:450  Voltages: 100Hz   Conduction Disturbances: none  Other Abnormalities: none   Narrative Interpretation: NSR with PVC

## 2020-09-05 MED FILL — LISINOPRIL-HCTZ 20-12.5 MG: 20-12.5 | 90 days supply | Qty: 90 | Fill #0

## 2020-09-05 MED FILL — JARDIANCE 10 MG TABLET: 10 | 90 days supply | Qty: 90 | Fill #1

## 2020-09-05 MED FILL — metFORMIN HCL 1000 MG TABS: 1000 | 90 days supply | Qty: 180 | Fill #0

## 2020-09-09 NOTE — Telephone Encounter (Signed)
Please review  Thank you

## 2020-09-16 DIAGNOSIS — H25811 Combined forms of age-related cataract, right eye: Secondary | ICD-10-CM | POA: Diagnosis not present

## 2020-10-13 ENCOUNTER — Ambulatory Visit (HOSPITAL_COMMUNITY)
Admission: RE | Admit: 2020-10-13 | Discharge: 2020-10-13 | Disposition: A | Discharge status: Home / Self Care | Payer: Medicaid Other | Source: Ambulatory Visit | Attending: Nurse Practitioner | Admitting: Nurse Practitioner

## 2020-10-13 ENCOUNTER — Encounter: Payer: Self-pay | Admitting: Nurse Practitioner

## 2020-10-13 ENCOUNTER — Other Ambulatory Visit: Payer: Self-pay

## 2020-10-13 ENCOUNTER — Other Ambulatory Visit: Payer: Self-pay | Admitting: Nurse Practitioner

## 2020-10-13 ENCOUNTER — Ambulatory Visit (INDEPENDENT_AMBULATORY_CARE_PROVIDER_SITE_OTHER): Payer: Medicaid Other | Admitting: Nurse Practitioner

## 2020-10-13 VITALS — BP 105/84 | HR 95 | Temp 97.7°F | Resp 17 | Ht 72.0 in | Wt 211.0 lb

## 2020-10-13 DIAGNOSIS — Z130 Encounter for screening for diseases of the blood and blood-forming organs and certain disorders involving the immune mechanism: Secondary | ICD-10-CM

## 2020-10-13 DIAGNOSIS — I10 Essential (primary) hypertension: Secondary | ICD-10-CM | POA: Diagnosis not present

## 2020-10-13 DIAGNOSIS — E1169 Type 2 diabetes mellitus with other specified complication: Secondary | ICD-10-CM | POA: Diagnosis not present

## 2020-10-13 DIAGNOSIS — C189 Malignant neoplasm of colon, unspecified: Secondary | ICD-10-CM | POA: Diagnosis not present

## 2020-10-13 DIAGNOSIS — R0989 Other specified symptoms and signs involving the circulatory and respiratory systems: Secondary | ICD-10-CM

## 2020-10-13 DIAGNOSIS — R0602 Shortness of breath: Secondary | ICD-10-CM | POA: Diagnosis not present

## 2020-10-13 NOTE — Progress Notes (Signed)
[place], [place]  [address] Phone:  [phone]   Fax:  [phone]   Established Patient Office Visit  Subjective:  Patient ID: Kyle Merritt, male    DOB: [birth year]  Age: 59 y.o. MRN: [id number]  CC:  Chief Complaint  Patient presents with  . Follow-up    Pt states he is having some congestion, running nose coughing and alot o mucus. X7 days Pt states he would like to get tested for pneumonia.    HPI Kyle Merritt presents congestion and follow up. He  has a past medical history of Arthritis, Colon cancer ([place]), Diabetes mellitus without complication ([place]), High cholesterol, History of colon cancer, History of colon cancer (2004), and Hypertension.    Upper Respiratory Infection Patient complains of symptoms of a URI. Symptoms include congestion. Onset of symptoms was 1 week ago, and has been gradually improving since that time. Treatment to date: cough suppressants and decongestants.  He is currently being treated for HTN and DM. He is doing well with treatment regimen. His HgbA1c is at goal along with his BP. He is compliant with his diet. Denies headache, dizziness, visual changes, chest pain, nausea, vomiting or any edema.    Past Medical History:  Diagnosis Date  . Arthritis   . Colon cancer ([place])   . Diabetes mellitus without complication ([place])   . High cholesterol   . History of colon cancer   . History of colon cancer 2004  . Hypertension     Past Surgical History:  Procedure Laterality Date  . COLON SURGERY    . KNEE ARTHROSCOPY Bilateral   . 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 TRIGGER FINGER RELEASE Right 05/22/2019    Family History  Problem Relation Age of Onset  . Diabetes Mother   . Hypertension Mother   . Colon cancer Paternal Uncle   . Esophageal cancer Neg Hx    . Inflammatory bowel disease Neg Hx   . Liver disease Neg Hx   . Pancreatic cancer Neg Hx   . Stomach cancer Neg Hx     Social History   Socioeconomic History  . Marital status: Divorced    Spouse name: Not on file  . Number of children: Not on file  . Years of education: Not on file  . Highest education level: Not on file  Occupational History  . Not on file  Tobacco Use  . Smoking status: Former Smoker    Quit date: 2018    Years since quitting: 3.8  . Smokeless tobacco: Never Used  Vaping Use  . Vaping Use: Never used  Substance and Sexual Activity  . Alcohol use: No  . Drug use: No  . Sexual activity: Yes  Other Topics Concern  . Not on file  Social History Narrative  . Not on file   Social Determinants of Health   Financial Resource Strain:   . Difficulty of Paying Living Expenses: Not on file  Food Insecurity:   . Worried About Charity fundraiser in the Last Year: Not on file  . Ran Out of Food in the Last Year: Not on file  Transportation Needs:   . Lack of Transportation (Medical): Not on file  . Lack of Transportation (Non-Medical): Not on file  Physical Activity:   . Days of Exercise per  Week: Not on file  . Minutes of Exercise per Session: Not on file  Stress:   . Feeling of Stress : Not on file  Social Connections:   . Frequency of Communication with Friends and Family: Not on file  . Frequency of Social Gatherings with Friends and Family: Not on file  . Attends Religious Services: Not on file  . Active Member of Clubs or Organizations: Not on file  . Attends Archivist Meetings: Not on file  . Marital Status: Not on file  Intimate Partner Violence:   . Fear of Current or Ex-Partner: Not on file  . Emotionally Abused: Not on file  . Physically Abused: Not on file  . Sexually Abused: Not on file    Outpatient Medications Prior to Visit  Medication Sig Dispense Refill  . ACCU-CHEK GUIDE test strip USE AS DIRECTED UP TO 4 TIMES  DAILY. 100 each 0  . atorvastatin (LIPITOR) 40 MG tablet Take 1 tablet (40 mg total) by mouth every evening. 90 tablet 3  . blood glucose meter kit and supplies KIT Dispense based on patient and insurance preference. Use up to four times daily as directed. (FOR ICD-10  E11.9). 1 each 0  . empagliflozin (JARDIANCE) 10 MG TABS tablet Take 1 tablet (10 mg total) by mouth daily. 90 tablet 3  . lisinopril-hydrochlorothiazide (ZESTORETIC) 20-12.5 MG tablet Take 1 tablet by mouth daily. 90 tablet 3  . metFORMIN (GLUCOPHAGE) 1000 MG tablet Take 1 tablet (1,000 mg total) by mouth 2 (two) times daily with a meal. 180 tablet 3  . omeprazole (PRILOSEC) 40 MG capsule Take 1 capsule (40 mg total) by mouth daily. 90 capsule 3  . sildenafil (VIAGRA) 100 MG tablet TAKE 1 TABLET BY MOUTH ONCE DAILY AS NEEDED FOR ERECTILE DYSFUNCTION (Patient not taking: Reported on 10/13/2020) 10 tablet 0   Facility-Administered Medications Prior to Visit  Medication Dose Route Frequency Provider Last Rate Last Admin  . 0.9 %  sodium chloride infusion  500 mL Intravenous Once Mansouraty, Telford Nab., MD        Allergies  Allergen Reactions  . Crab [Shellfish Allergy]     itching    ROS Review of Systems  Constitutional: Positive for fatigue.  HENT: Positive for postnasal drip and sinus pressure. Negative for ear pain.   Eyes: Negative.   Respiratory: Positive for shortness of breath.   Cardiovascular: Negative for chest pain.  Gastrointestinal: Negative.   Endocrine: Negative.   Musculoskeletal: Negative.   Skin: Negative.   Allergic/Immunologic: Negative.   Neurological: Negative for dizziness and headaches.      Objective:    Physical Exam  BP 105/84 (BP Location: Left Arm, Patient Position: Sitting, Cuff Size: Normal)   Pulse 95   Temp 97.7 F (36.5 C)   Resp 17   Ht 6' (1.829 m)   Wt 211 lb (95.7 kg)   SpO2 99%   BMI 28.62 kg/m  Wt Readings from Last 3 Encounters:  10/13/20 211 lb (95.7 kg)   08/15/20 218 lb (98.9 kg)  05/17/20 221 lb 9.6 oz (100.5 kg)     There are no preventive care reminders to display for this patient.  There are no preventive care reminders to display for this patient.  Lab Results  Component Value Date   TSH 1.640 01/06/2020   Lab Results  Component Value Date   WBC 6.7 10/13/2020   HGB 14.3 10/13/2020   HCT 43.8 10/13/2020   MCV 80 10/13/2020  PLT 368 10/13/2020   Lab Results  Component Value Date   NA 139 10/13/2020   K 4.8 10/13/2020   CO2 25 01/11/2020   GLUCOSE 141 (H) 10/13/2020   BUN 26 10/13/2020   CREATININE 1.80 (H) 10/13/2020   BILITOT 1.0 10/13/2020   ALKPHOS 140 (H) 10/13/2020   AST 11 10/13/2020   ALT 9 01/11/2020   PROT 7.9 10/13/2020   ALBUMIN 4.9 (H) 10/13/2020   CALCIUM 10.2 10/13/2020   ANIONGAP 13 01/11/2020   Lab Results  Component Value Date   CHOL 199 08/15/2020   Lab Results  Component Value Date   HDL 66 08/15/2020   Lab Results  Component Value Date   LDLCALC 116 (H) 08/15/2020   Lab Results  Component Value Date   TRIG 96 08/15/2020   Lab Results  Component Value Date   CHOLHDL 3.0 08/15/2020   Lab Results  Component Value Date   HGBA1C 6.6 (A) 08/15/2020   HGBA1C 6.6 08/15/2020   HGBA1C 6.6 (A) 08/15/2020   HGBA1C 6.6 08/15/2020      Assessment & Plan:   Problem List Items Addressed This Visit      Cardiovascular and Mediastinum   Essential hypertension Encouraged on going compliance with current medication regimen Encouraged home monitoring and recording BP <130/80 Eating a heart-healthy diet with less salt Encouraged regular physical activity  Recommend Weight loss      Endocrine   Diabetes ([place]) - Primary Encourage compliance with current treatment regimen   Encourage regular CBG monitoring Encourage contacting office if excessive hyperglycemia and or hypoglycemia Lifestyle modification with healthy diet (fewer calories, more high fiber foods, whole grains and  non-starchy vegetables, lower fat meat and fish, low-fat diary include healthy oils) regular exercise (physical activity) and weight loss Opthalmology exam discussed  Nutritional consult recommended Regular dental visits encouraged Home BP monitoring also encouraged goal <130/80     Relevant Orders   Comp. Metabolic Panel (12)    Other Visit Diagnoses    Screening for deficiency anemia       Relevant Orders   CBC with Differential/Platelet   Vitamin B12   Chest congestion       Relevant Orders   DG Chest 2 View (Completed)      No orders of the defined types were placed in this encounter.   Follow-up: Return in about 3 months (around 01/13/2021).    Vevelyn Francois, NP

## 2020-10-13 NOTE — Patient Instructions (Signed)

## 2020-10-14 LAB — CBC WITH DIFFERENTIAL/PLATELET
Basophils Absolute: 0.1 10*3/uL (ref 0.0–0.2)
Basos: 1 %
EOS (ABSOLUTE): 0.2 10*3/uL (ref 0.0–0.4)
Eos: 3 %
Hematocrit: 43.8 % (ref 37.5–51.0)
Hemoglobin: 14.3 g/dL (ref 13.0–17.7)
Immature Grans (Abs): 0 10*3/uL (ref 0.0–0.1)
Immature Granulocytes: 0 %
Lymphocytes Absolute: 2.9 10*3/uL (ref 0.7–3.1)
Lymphs: 44 %
MCH: 26.2 pg — ABNORMAL LOW (ref 26.6–33.0)
MCHC: 32.6 g/dL (ref 31.5–35.7)
MCV: 80 fL (ref 79–97)
Monocytes Absolute: 0.3 10*3/uL (ref 0.1–0.9)
Monocytes: 5 %
Neutrophils Absolute: 3.2 10*3/uL (ref 1.4–7.0)
Neutrophils: 47 %
Platelets: 368 10*3/uL (ref 150–450)
RBC: 5.45 x10E6/uL (ref 4.14–5.80)
RDW: 14.6 % (ref 11.6–15.4)
WBC: 6.7 10*3/uL (ref 3.4–10.8)

## 2020-10-14 LAB — COMP. METABOLIC PANEL (12)
AST: 11 IU/L (ref 0–40)
Albumin/Globulin Ratio: 1.6 (ref 1.2–2.2)
Albumin: 4.9 g/dL — ABNORMAL HIGH (ref 3.8–4.8)
Alkaline Phosphatase: 140 IU/L — ABNORMAL HIGH (ref 44–121)
BUN/Creatinine Ratio: 14 (ref 10–24)
BUN: 26 mg/dL (ref 8–27)
Bilirubin Total: 1 mg/dL (ref 0.0–1.2)
Calcium: 10.2 mg/dL (ref 8.6–10.2)
Chloride: 99 mmol/L (ref 96–106)
Creatinine, Ser: 1.8 mg/dL — ABNORMAL HIGH (ref 0.76–1.27)
GFR calc Af Amer: 46 mL/min/{1.73_m2} — ABNORMAL LOW (ref >59)
GFR calc non Af Amer: 40 mL/min/{1.73_m2} — ABNORMAL LOW (ref >59)
Globulin, Total: 3 g/dL (ref 1.5–4.5)
Glucose: 141 mg/dL — ABNORMAL HIGH (ref 65–99)
Potassium: 4.8 mmol/L (ref 3.5–5.2)
Sodium: 139 mmol/L (ref 134–144)
Total Protein: 7.9 g/dL (ref 6.0–8.5)

## 2020-10-14 LAB — VITAMIN B12: Vitamin B-12: 624 pg/mL (ref 232–1245)

## 2020-10-18 ENCOUNTER — Other Ambulatory Visit: Payer: Self-pay | Admitting: Nurse Practitioner

## 2020-10-19 ENCOUNTER — Other Ambulatory Visit: Payer: Self-pay | Admitting: Nurse Practitioner

## 2020-10-19 DIAGNOSIS — N529 Male erectile dysfunction, unspecified: Secondary | ICD-10-CM

## 2020-10-20 ENCOUNTER — Other Ambulatory Visit: Payer: Self-pay | Admitting: Nurse Practitioner

## 2020-10-20 DIAGNOSIS — N1831 Chronic kidney disease, stage 3a: Secondary | ICD-10-CM

## 2020-10-20 DIAGNOSIS — H5213 Myopia, bilateral: Secondary | ICD-10-CM | POA: Diagnosis not present

## 2020-10-21 NOTE — Telephone Encounter (Signed)
Please review  Thank you

## 2020-11-11 DIAGNOSIS — D649 Anemia, unspecified: Secondary | ICD-10-CM | POA: Diagnosis not present

## 2020-11-11 DIAGNOSIS — N1831 Chronic kidney disease, stage 3a: Secondary | ICD-10-CM | POA: Diagnosis not present

## 2020-11-11 DIAGNOSIS — I129 Hypertensive chronic kidney disease with stage 1 through stage 4 chronic kidney disease, or unspecified chronic kidney disease: Secondary | ICD-10-CM | POA: Diagnosis not present

## 2020-11-11 DIAGNOSIS — E1122 Type 2 diabetes mellitus with diabetic chronic kidney disease: Secondary | ICD-10-CM | POA: Diagnosis not present

## 2020-11-11 DIAGNOSIS — E785 Hyperlipidemia, unspecified: Secondary | ICD-10-CM | POA: Diagnosis not present

## 2020-11-21 ENCOUNTER — Ambulatory Visit: Payer: Medicaid Other | Admitting: Nurse Practitioner

## 2020-11-24 ENCOUNTER — Other Ambulatory Visit: Payer: Self-pay | Admitting: Nephrology

## 2020-11-24 DIAGNOSIS — N1831 Chronic kidney disease, stage 3a: Secondary | ICD-10-CM

## 2020-11-28 ENCOUNTER — Other Ambulatory Visit: Payer: Self-pay | Admitting: Ophthalmology

## 2020-11-28 MED FILL — PREDNISOLONE AC 1% EYE DROP: 1 | 7 days supply | Qty: 5 | Fill #0

## 2020-11-30 ENCOUNTER — Other Ambulatory Visit: Payer: Self-pay | Admitting: Ophthalmology

## 2020-11-30 MED FILL — VALACYCLOVIR HCL 500 MG TAB: 500 | 30 days supply | Qty: 90 | Fill #0

## 2020-12-05 ENCOUNTER — Other Ambulatory Visit: Payer: Self-pay | Admitting: Ophthalmology

## 2020-12-05 MED FILL — DORZOLAMIDE-TIMOLOL EYE DRP: 22.3-6.8 | 30 days supply | Qty: 10 | Fill #0

## 2020-12-05 MED FILL — PREDNISOLONE AC 1% EYE DROP: 1 | 30 days supply | Qty: 10 | Fill #0

## 2020-12-05 MED FILL — ALPHAGAN P 0.1% DROPS: 0.1 | 30 days supply | Qty: 5 | Fill #0

## 2020-12-12 ENCOUNTER — Encounter: Payer: Self-pay | Admitting: Nurse Practitioner

## 2020-12-12 ENCOUNTER — Other Ambulatory Visit: Payer: Self-pay | Admitting: Nurse Practitioner

## 2020-12-12 ENCOUNTER — Other Ambulatory Visit: Payer: Self-pay

## 2020-12-12 ENCOUNTER — Ambulatory Visit (INDEPENDENT_AMBULATORY_CARE_PROVIDER_SITE_OTHER): Payer: Medicaid Other | Admitting: Nurse Practitioner

## 2020-12-12 ENCOUNTER — Ambulatory Visit
Admission: RE | Admit: 2020-12-12 | Discharge: 2020-12-12 | Disposition: A | Discharge status: Home / Self Care | Payer: Medicaid Other | Source: Ambulatory Visit | Attending: Nephrology | Admitting: Nephrology

## 2020-12-12 VITALS — BP 154/87 | HR 74 | Temp 98.1°F | Ht 72.0 in | Wt 231.0 lb

## 2020-12-12 DIAGNOSIS — R351 Nocturia: Secondary | ICD-10-CM | POA: Diagnosis not present

## 2020-12-12 DIAGNOSIS — N529 Male erectile dysfunction, unspecified: Secondary | ICD-10-CM | POA: Diagnosis not present

## 2020-12-12 DIAGNOSIS — N1831 Chronic kidney disease, stage 3a: Secondary | ICD-10-CM | POA: Diagnosis not present

## 2020-12-12 DIAGNOSIS — E1169 Type 2 diabetes mellitus with other specified complication: Secondary | ICD-10-CM | POA: Diagnosis not present

## 2020-12-12 DIAGNOSIS — N401 Enlarged prostate with lower urinary tract symptoms: Secondary | ICD-10-CM | POA: Diagnosis not present

## 2020-12-12 DIAGNOSIS — Z23 Encounter for immunization: Secondary | ICD-10-CM

## 2020-12-12 DIAGNOSIS — I10 Essential (primary) hypertension: Secondary | ICD-10-CM

## 2020-12-12 DIAGNOSIS — Z125 Encounter for screening for malignant neoplasm of prostate: Secondary | ICD-10-CM | POA: Diagnosis not present

## 2020-12-12 LAB — POCT GLYCOSYLATED HEMOGLOBIN (HGB A1C): Hemoglobin A1C: 6.2 % — AB (ref 4.0–5.6)

## 2020-12-12 LAB — GLUCOSE, POCT (MANUAL RESULT ENTRY): POC Glucose: 160 mg/dl — AB (ref 70–99)

## 2020-12-12 MED ORDER — TADALAFIL 20 MG PO TABS: 20.0000 mg | ORAL_TABLET | Freq: Every day | ORAL | 0 refills | Status: DC | PRN

## 2020-12-12 MED ORDER — TAMSULOSIN HCL 0.4 MG PO CAPS: 0.4000 mg | ORAL_CAPSULE | Freq: Every day | ORAL | 3 refills | Status: AC

## 2020-12-12 MED ORDER — TAMSULOSIN HCL 0.4 MG PO CAPS: 0.4000 mg | ORAL_CAPSULE | Freq: Every day | ORAL | 3 refills | Status: DC

## 2020-12-12 MED FILL — TAMSULOSIN HCL 0.4 MG CAP: 0.4 | 90 days supply | Qty: 90 | Fill #0

## 2020-12-12 NOTE — Progress Notes (Signed)
[place], [place]  [address] Phone:  [phone]   Fax:  [phone]   Established Patient Office Visit  Subjective:  Patient ID: Kyle Merritt, male    DOB: [birth year]  Age: 59 y.o. MRN: [id number]  CC: No chief complaint on file.   HPI Kyle Merritt presents for follow up. Kyle Merritt  has a past medical history of Arthritis, Colon cancer ([place]), Diabetes mellitus without complication ([place]), High cholesterol, History of colon cancer, History of colon cancer (2004), and Hypertension.  Hypertension Patient is here for follow-up of elevated blood pressure. Kyle Merritt is not exercising and is adherent to a low-salt diet. Blood pressure is not monitored at home. Cardiac symptoms: none. Patient denies chest pain, dyspnea, fatigue, irregular heart beat, lower extremity edema, palpitations and syncope. Cardiovascular risk factors: advanced age (older than 9 for men, 48 for women), diabetes mellitus, dyslipidemia, hypertension, male gender and obesity (BMI >= 30 kg/m2). Use of agents associated with hypertension: none. History of target organ damage: none.  Diabetes Mellitus Patient presents for follow up of diabetes. Current symptoms include: hyperglycemia and visual disturbances. Symptoms have stabilized. Patient denies foot ulcerations, hypoglycemia , increased appetite, nausea, paresthesia of the feet, polydipsia, polyuria, vomiting and weight loss. Evaluation to date has included: fasting blood sugar, fasting lipid panel, hemoglobin A1C and microalbuminuria.  Home sugars: BGs range between 100 and 200. Current treatment: Continued metformin which has been effective, Continued statin which has been effective, Continued ACE inhibitor/ARB which has been effective and Continued  empagliflozin 10 mg daily which has been effective. Last dilated eye exam: 2021 Continues to see Dr Schuyler Amor  Past Medical History:  Diagnosis Date  . Arthritis   . Colon cancer ([place])   . Diabetes  mellitus without complication ([place])   . High cholesterol   . History of colon cancer   . History of colon cancer 2004  . Hypertension     Past Surgical History:  Procedure Laterality Date  . COLON SURGERY    . KNEE ARTHROSCOPY Bilateral   . 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 TRIGGER FINGER RELEASE Right 05/22/2019    Family History  Problem Relation Age of Onset  . Diabetes Mother   . Hypertension Mother   . Colon cancer Paternal Uncle   . Esophageal cancer Neg Hx   . Inflammatory bowel disease Neg Hx   . Liver disease Neg Hx   . Pancreatic cancer Neg Hx   . Stomach cancer Neg Hx     Social History   Socioeconomic History  . Marital status: Divorced    Spouse name: Not on file  . Number of children: Not on file  . Years of education: Not on file  . Highest education level: Not on file  Occupational History  . Not on file  Tobacco Use  . Smoking status: Former Smoker    Quit date: 2018    Years since quitting: 3.9  . Smokeless tobacco: Never Used  Vaping Use  . Vaping Use: Never used  Substance and Sexual Activity  . Alcohol use: No  . Drug use: No  . Sexual activity: Yes  Other Topics Concern  . Not on file  Social History Narrative  . Not on file   Social Determinants of Health   Financial Resource Strain: Not on file  Food  Insecurity: Not on file  Transportation Needs: Not on file  Physical Activity: Not on file  Stress: Not on file  Social Connections: Not on file  Intimate Partner Violence: Not on file    Outpatient Medications Prior to Visit  Medication Sig Dispense Refill  . ACCU-CHEK GUIDE test strip USE AS DIRECTED UP TO 4 TIMES DAILY. 100 each 0  . atorvastatin (LIPITOR) 40 MG tablet Take 1 tablet (40 mg total) by mouth every evening. 90 tablet 3  . blood glucose meter kit and supplies KIT Dispense  based on patient and insurance preference. Use up to four times daily as directed. (FOR ICD-10  E11.9). 1 each 0  . empagliflozin (JARDIANCE) 10 MG TABS tablet Take 1 tablet (10 mg total) by mouth daily. 90 tablet 3  . ketorolac (ACULAR) 0.4 % SOLN     . lisinopril-hydrochlorothiazide (ZESTORETIC) 20-12.5 MG tablet Take 1 tablet by mouth daily. 90 tablet 3  . metFORMIN (GLUCOPHAGE) 1000 MG tablet Take 1 tablet (1,000 mg total) by mouth 2 (two) times daily with a meal. 180 tablet 3  . omeprazole (PRILOSEC) 40 MG capsule Take 1 capsule (40 mg total) by mouth daily. 90 capsule 3  . sildenafil (VIAGRA) 100 MG tablet TAKE 1 TABLET BY MOUTH ONCE DAILY AS NEEDED FOR ERECTILE DYSFUNCTION 10 tablet 0   Facility-Administered Medications Prior to Visit  Medication Dose Route Frequency Provider Last Rate Last Admin  . 0.9 %  sodium chloride infusion  500 mL Intravenous Once Mansouraty, Telford Nab., MD        Allergies  Allergen Reactions  . Crab [Shellfish Allergy]     itching    ROS Review of Systems  Genitourinary:       Weaker stream,nocturia and hesitancy sometimes urgency        Objective:    Physical Exam HENT:     Head: Normocephalic and atraumatic.     Nose: Nose normal.     Mouth/Throat:     Mouth: Mucous membranes are moist.  Cardiovascular:     Rate and Rhythm: Normal rate and regular rhythm.     Pulses: Normal pulses.  Pulmonary:     Effort: Pulmonary effort is normal.     Breath sounds: Normal breath sounds.  Abdominal:     Palpations: Abdomen is soft.  Musculoskeletal:        General: Normal range of motion.     Cervical back: Normal range of motion.  Skin:    General: Skin is warm and dry.     Capillary Refill: Capillary refill takes less than 2 seconds.  Neurological:     General: No focal deficit present.     Mental Status: Kyle Merritt is alert and oriented to person, place, and time.  Psychiatric:        Mood and Affect: Mood normal.        Behavior: Behavior  normal.        Thought Content: Thought content normal.        Judgment: Judgment normal.     BP (!) 154/87   Pulse 74   Temp 98.1 F (36.7 C) (Temporal)   Ht 6' (1.829 m)   Wt 231 lb (104.8 kg)   SpO2 99%   BMI 31.33 kg/m  Wt Readings from Last 3 Encounters:  12/12/20 231 lb (104.8 kg)  10/13/20 211 lb (95.7 kg)  08/15/20 218 lb (98.9 kg)     There are no preventive care reminders to display for this  patient.  There are no preventive care reminders to display for this patient.  Lab Results  Component Value Date   TSH 1.640 01/06/2020   Lab Results  Component Value Date   WBC 6.7 10/13/2020   HGB 14.3 10/13/2020   HCT 43.8 10/13/2020   MCV 80 10/13/2020   PLT 368 10/13/2020   Lab Results  Component Value Date   NA 139 10/13/2020   K 4.8 10/13/2020   CO2 25 01/11/2020   GLUCOSE 141 (H) 10/13/2020   BUN 26 10/13/2020   CREATININE 1.80 (H) 10/13/2020   BILITOT 1.0 10/13/2020   ALKPHOS 140 (H) 10/13/2020   AST 11 10/13/2020   ALT 9 01/11/2020   PROT 7.9 10/13/2020   ALBUMIN 4.9 (H) 10/13/2020   CALCIUM 10.2 10/13/2020   ANIONGAP 13 01/11/2020   Lab Results  Component Value Date   CHOL 199 08/15/2020   Lab Results  Component Value Date   HDL 66 08/15/2020   Lab Results  Component Value Date   LDLCALC 116 (H) 08/15/2020   Lab Results  Component Value Date   TRIG 96 08/15/2020   Lab Results  Component Value Date   CHOLHDL 3.0 08/15/2020   Lab Results  Component Value Date   HGBA1C 6.2 (A) 12/12/2020      Assessment & Plan:   Problem List Items Addressed This Visit      Cardiovascular and Mediastinum   Essential hypertension Encouraged on going compliance with current medication regimen Encouraged home monitoring and recording BP <130/80 Eating a heart-healthy diet with less salt Encouraged regular physical activity  Recommend Weight loss   Relevant Medications   tadalafil (CIALIS) 20 MG tablet     Endocrine   Diabetes ([facility]) -  Primary A1c stable at goal 6.2% Encourage continued compliance with current treatment regimen and regular CBG monitoring Encourage contacting office if excessive hyperglycemia and or hypoglycemia Lifestyle modification with healthy diet (fewer calories, more high fiber foods, whole grains and non-starchy vegetables, lower fat meat and fish, low-fat diary include healthy oils) regular exercise (physical activity) and weight loss Nutritional discussed Regular dental visits encouraged Home BP monitoring also encouraged goal <130/80     Relevant Orders   POCT glycosylated hemoglobin (Hb A1C)   POCT glucose (manual entry)     Other   Erectile dysfunction Trial Cialis 20 mg as needed    Other Visit Diagnoses    Stage 3a chronic kidney disease ([place])     Continue to follow up with Nephrology as scheduled   Screening PSA (prostate specific antigen)       Benign prostatic hyperplasia with nocturia   PSA screening pending Trial tamsulosin 0.4 mg reevaluate in 3 months      Meds ordered this encounter  Medications  . tadalafil (CIALIS) 20 MG tablet    Sig: Take 1 tablet (20 mg total) by mouth daily as needed for erectile dysfunction.    Dispense:  10 tablet    Refill:  0    Order Specific Question:   Supervising Provider    Answer:   Tresa Garter [id number]  . DISCONTD: tamsulosin (FLOMAX) 0.4 MG CAPS capsule    Sig: Take 1 capsule (0.4 mg total) by mouth daily.    Dispense:  90 capsule    Refill:  3    Order Specific Question:   Supervising Provider    Answer:   Tresa Garter [id number]  . tamsulosin (FLOMAX) 0.4 MG CAPS capsule    Sig:  Take 1 capsule (0.4 mg total) by mouth daily.    Dispense:  90 capsule    Refill:  3    Order Specific Question:   Supervising Provider    Answer:   Tresa Garter [[id number]]    Follow-up: Return in about 3 months (around 03/12/2021) for HTN and DM.    Vevelyn Francois, NP

## 2020-12-12 NOTE — Patient Instructions (Addendum)
Preventing Hypertension Hypertension, commonly called high blood pressure, is when the force of blood pumping through the arteries is too strong. Arteries are blood vessels that carry blood from the heart throughout the body. Over time, hypertension can damage the arteries and decrease blood flow to important parts of the body, including the brain, heart, and kidneys. Often, hypertension does not cause symptoms until blood pressure is very high. For this reason, it is important to have your blood pressure checked on a regular basis. Hypertension can often be prevented with diet and lifestyle changes. If you already have hypertension, you can control it with diet and lifestyle changes, as well as medicine. What nutrition changes can be made? Maintain a healthy diet. This includes:  Eating less salt (sodium). Ask your health care provider how much sodium is safe for you to have. The general recommendation is to consume less than 1 tsp (2,300 mg) of sodium a day. ? Do not add salt to your food. ? Choose low-sodium options when grocery shopping and eating out.  Limiting fats in your diet. You can do this by eating low-fat or fat-free dairy products and by eating less red meat.  Eating more fruits, vegetables, and whole grains. Make a goal to eat: ? 1-2 cups of fresh fruits and vegetables each day. ? 3-4 servings of whole grains each day.  Avoiding foods and beverages that have added sugars.  Eating fish that contain healthy fats (omega-3 fatty acids), such as mackerel or salmon. If you need help putting together a healthy eating plan, try the DASH diet. This diet is high in fruits, vegetables, and whole grains. It is low in sodium, red meat, and added sugars. DASH stands for Dietary Approaches to Stop Hypertension. What lifestyle changes can be made?   Lose weight if you are overweight. Losing just 3?5% of your body weight can help prevent or control hypertension. ? For example, if your present  weight is 200 lb (91 kg), a loss of 3-5% of your weight means losing 6-10 lb (2.7-4.5 kg). ? Ask your health care provider to help you with a diet and exercise plan to safely lose weight.  Get enough exercise. Do at least 150 minutes of moderate-intensity exercise each week. ? You could do this in short exercise sessions several times a day, or you could do longer exercise sessions a few times a week. For example, you could take a brisk 10-minute walk or bike ride, 3 times a day, for 5 days a week.  Find ways to reduce stress, such as exercising, meditating, listening to music, or taking a yoga class. If you need help reducing stress, ask your health care provider.  Do not smoke. This includes e-cigarettes. Chemicals in tobacco and nicotine products raise your blood pressure each time you smoke. If you need help quitting, ask your health care provider.  Avoid alcohol. If you drink alcohol, limit alcohol intake to no more than 1 drink a day for nonpregnant women and 2 drinks a day for men. One drink equals 12 oz of beer, 5 oz of wine, or 1 oz of hard liquor. Why are these changes important? Diet and lifestyle changes can help you prevent hypertension, and they may make you feel better overall and improve your quality of life. If you have hypertension, making these changes will help you control it and help prevent major complications, such as:  Hardening and narrowing of arteries that supply blood to: ? Your heart. This can cause a heart  attack. ? Your brain. This can cause a stroke. ? Your kidneys. This can cause kidney failure.  Stress on your heart muscle, which can cause heart failure. What can I do to lower my risk?  Work with your health care provider to make a hypertension prevention plan that works for you. Follow your plan and keep all follow-up visits as told by your health care provider.  Learn how to check your blood pressure at home. Make sure that you know your personal target  blood pressure, as told by your health care provider. How is this treated? In addition to diet and lifestyle changes, your health care provider may recommend medicines to help lower your blood pressure. You may need to try a few different medicines to find what works best for you. You also may need to take more than one medicine. Take over-the-counter and prescription medicines only as told by your health care provider. Where to find support Your health care provider can help you prevent hypertension and help you keep your blood pressure at a healthy level. Your local hospital or your community may also provide support services and prevention programs. The American Heart Association offers an online support network at: CheapBootlegs.com.cy Where to find more information Learn more about hypertension from:  Newborn, Lung, and Blood Institute: ElectronicHangman.is  Centers for Disease Control and Prevention: https://ingram.com/  American Academy of Family Physicians: http://familydoctor.org/familydoctor/en/diseases-conditions/high-blood-pressure.printerview.all.html Learn more about the DASH diet from:  [place], Lung, and [place]: https://www.reyes.com/ Contact a health care provider if:  You think you are having a reaction to medicines you have taken.  You have recurrent headaches or feel dizzy.  You have swelling in your ankles.  You have trouble with your vision. Summary  Hypertension often does not cause any symptoms until blood pressure is very high. It is important to get your blood pressure checked regularly.  Diet and lifestyle changes are the most important steps in preventing hypertension.  By keeping your blood pressure in a healthy range, you can prevent complications like heart attack, heart failure, stroke, and kidney failure.  Work with your health care  provider to make a hypertension prevention plan that works for you. This information is not intended to replace advice given to you by your health care provider. Make sure you discuss any questions you have with your health care provider. Document Revised: 04/03/2019 Document Reviewed: 08/20/2016 Elsevier Patient Education  2020 [place].  Benign Prostatic Hyperplasia  Benign prostatic hyperplasia (BPH) is an enlarged prostate gland that is caused by the normal aging process and not by cancer. The prostate is a walnut-sized gland that is involved in the production of semen. It is located in front of the rectum and below the bladder. The bladder stores urine and the urethra is the tube that carries the urine out of the body. The prostate may get bigger as a man gets older. An enlarged prostate can press on the urethra. This can make it harder to pass urine. The build-up of urine in the bladder can cause infection. Back pressure and infection may progress to bladder damage and kidney (renal) failure. What are the causes? This condition is part of a normal aging process. However, not all men develop problems from this condition. If the prostate enlarges away from the urethra, urine flow will not be blocked. If it enlarges toward the urethra and compresses it, there will be problems passing urine. What increases the risk? This condition is more likely to develop in  men over the age of 66 years. What are the signs or symptoms? Symptoms of this condition include:  Getting up often during the night to urinate.  Needing to urinate frequently during the day.  Difficulty starting urine flow.  Decrease in size and strength of your urine stream.  Leaking (dribbling) after urinating.  Inability to pass urine. This needs immediate treatment.  Inability to completely empty your bladder.  Pain when you pass urine. This is more common if there is also an infection.  Urinary tract infection  (UTI). How is this diagnosed? This condition is diagnosed based on your medical history, a physical exam, and your symptoms. Tests will also be done, such as:  A post-void bladder scan. This measures any amount of urine that may remain in your bladder after you finish urinating.  A digital rectal exam. In a rectal exam, your health care provider checks your prostate by putting a lubricated, gloved finger into your rectum to feel the back of your prostate gland. This exam detects the size of your gland and any abnormal lumps or growths.  An exam of your urine (urinalysis).  A prostate specific antigen (PSA) screening. This is a blood test used to screen for prostate cancer.  An ultrasound. This test uses sound waves to electronically produce a picture of your prostate gland. Your health care provider may refer you to a specialist in kidney and prostate diseases (urologist). How is this treated? Once symptoms begin, your health care provider will monitor your condition (active surveillance or watchful waiting). Treatment for this condition will depend on the severity of your condition. Treatment may include:  Observation and yearly exams. This may be the only treatment needed if your condition and symptoms are mild.  Medicines to relieve your symptoms, including: ? Medicines to shrink the prostate. ? Medicines to relax the muscle of the prostate.  Surgery in severe cases. Surgery may include: ? Prostatectomy. In this procedure, the prostate tissue is removed completely through an open incision or with a laparoscope or robotics. ? Transurethral resection of the prostate (TURP). In this procedure, a tool is inserted through the opening at the tip of the penis (urethra). It is used to cut away tissue of the inner core of the prostate. The pieces are removed through the same opening of the penis. This removes the blockage. ? Transurethral incision (TUIP). In this procedure, small cuts are made in  the prostate. This lessens the prostate's pressure on the urethra. ? Transurethral microwave thermotherapy (TUMT). This procedure uses microwaves to create heat. The heat destroys and removes a small amount of prostate tissue. ? Transurethral needle ablation (TUNA). This procedure uses radio frequencies to destroy and remove a small amount of prostate tissue. ? Interstitial laser coagulation ([place]). This procedure uses a laser to destroy and remove a small amount of prostate tissue. ? Transurethral electrovaporization (TUVP). This procedure uses electrodes to destroy and remove a small amount of prostate tissue. ? Prostatic urethral lift. This procedure inserts an implant to push the lobes of the prostate away from the urethra. Follow these instructions at home:  Take over-the-counter and prescription medicines only as told by your health care provider.  Monitor your symptoms for any changes. Contact your health care provider with any changes.  Avoid drinking large amounts of liquid before going to bed or out in public.  Avoid or reduce how much caffeine or alcohol you drink.  Give yourself time when you urinate.  Keep all follow-up visits as  told by your health care provider. This is important. Contact a health care provider if:  You have unexplained back pain.  Your symptoms do not get better with treatment.  You develop side effects from the medicine you are taking.  Your urine becomes very dark or has a bad smell.  Your lower abdomen becomes distended and you have trouble passing your urine. Get help right away if:  You have a fever or chills.  You suddenly cannot urinate.  You feel lightheaded, or very dizzy, or you faint.  There are large amounts of blood or clots in the urine.  Your urinary problems become hard to manage.  You develop moderate to severe low back or flank pain. The flank is the side of your body between the ribs and the hip. These symptoms may  represent a serious problem that is an emergency. Do not wait to see if the symptoms will go away. Get medical help right away. Call your local emergency services (911 in the [place] Do not drive yourself to the hospital. Summary  Benign prostatic hyperplasia (BPH) is an enlarged prostate that is caused by the normal aging process and not by cancer.  An enlarged prostate can press on the urethra. This can make it hard to pass urine.  This condition is part of a normal aging process and is more likely to develop in men over the age of 34 years.  Get help right away if you suddenly cannot urinate. This information is not intended to replace advice given to you by your health care provider. Make sure you discuss any questions you have with your health care provider. Document Revised: 11/04/2018 Document Reviewed: 01/14/2017 Elsevier Patient Education  2020 [employer].

## 2020-12-13 LAB — PSA: Prostate Specific Ag, Serum: 0.7 ng/mL (ref 0.0–4.0)

## 2020-12-28 ENCOUNTER — Telehealth: Payer: Self-pay | Admitting: Family Medicine

## 2020-12-28 ENCOUNTER — Other Ambulatory Visit: Payer: Self-pay | Admitting: Nurse Practitioner

## 2020-12-28 MED ORDER — ALBUTEROL SULFATE HFA 108 (90 BASE) MCG/ACT IN AERS: 2.0000 | INHALATION_SPRAY | Freq: Four times a day (QID) | RESPIRATORY_TRACT | 2 refills | Status: DC | PRN

## 2020-12-28 NOTE — Telephone Encounter (Signed)
Sent!

## 2020-12-29 MED FILL — PROAIR HFA 90 MCG INHALER: 108 (90 BAS | 3 days supply | Qty: 9 | Fill #0

## 2020-12-29 MED FILL — JARDIANCE 10 MG TABLET: 10 | 60 days supply | Qty: 60 | Fill #2

## 2020-12-29 MED FILL — LISINOPRIL-HCTZ 20-12.5 MG: 20-12.5 | 90 days supply | Qty: 90 | Fill #1

## 2020-12-29 MED FILL — METFORMIN HCL 1000 MG TABS: 1000 | 90 days supply | Qty: 180 | Fill #1

## 2021-01-28 DIAGNOSIS — Z20822 Contact with and (suspected) exposure to covid-19: Secondary | ICD-10-CM | POA: Diagnosis not present

## 2021-03-13 ENCOUNTER — Ambulatory Visit: Payer: Medicaid Other | Admitting: Nurse Practitioner

## 2021-03-13 ENCOUNTER — Ambulatory Visit (INDEPENDENT_AMBULATORY_CARE_PROVIDER_SITE_OTHER): Payer: Medicaid Other | Admitting: Internal Medicine

## 2021-03-13 ENCOUNTER — Encounter: Payer: Self-pay | Admitting: Internal Medicine

## 2021-03-13 ENCOUNTER — Other Ambulatory Visit: Payer: Self-pay | Admitting: Nurse Practitioner

## 2021-03-13 ENCOUNTER — Other Ambulatory Visit: Payer: Self-pay

## 2021-03-13 ENCOUNTER — Encounter: Payer: Self-pay | Admitting: Nurse Practitioner

## 2021-03-13 VITALS — BP 150/82 | HR 74 | Ht 72.0 in | Wt 233.8 lb

## 2021-03-13 VITALS — BP 147/79 | HR 60 | Ht 72.0 in | Wt 233.0 lb

## 2021-03-13 DIAGNOSIS — I493 Ventricular premature depolarization: Secondary | ICD-10-CM | POA: Diagnosis not present

## 2021-03-13 DIAGNOSIS — C189 Malignant neoplasm of colon, unspecified: Secondary | ICD-10-CM

## 2021-03-13 DIAGNOSIS — E785 Hyperlipidemia, unspecified: Secondary | ICD-10-CM

## 2021-03-13 DIAGNOSIS — I498 Other specified cardiac arrhythmias: Secondary | ICD-10-CM

## 2021-03-13 DIAGNOSIS — I10 Essential (primary) hypertension: Secondary | ICD-10-CM | POA: Diagnosis not present

## 2021-03-13 DIAGNOSIS — N1831 Chronic kidney disease, stage 3a: Secondary | ICD-10-CM | POA: Diagnosis not present

## 2021-03-13 DIAGNOSIS — I499 Cardiac arrhythmia, unspecified: Secondary | ICD-10-CM | POA: Diagnosis not present

## 2021-03-13 DIAGNOSIS — H524 Presbyopia: Secondary | ICD-10-CM | POA: Diagnosis not present

## 2021-03-13 DIAGNOSIS — E1169 Type 2 diabetes mellitus with other specified complication: Secondary | ICD-10-CM

## 2021-03-13 LAB — POCT URINALYSIS DIP (CLINITEK)
Bilirubin, UA: NEGATIVE
Blood, UA: NEGATIVE
Glucose, UA: NEGATIVE mg/dL
Ketones, POC UA: NEGATIVE mg/dL
Leukocytes, UA: NEGATIVE
Nitrite, UA: NEGATIVE
POC PROTEIN,UA: NEGATIVE
Spec Grav, UA: 1.015 (ref 1.010–1.025)
Urobilinogen, UA: 0.2 E.U./dL
pH, UA: 5 (ref 5.0–8.0)

## 2021-03-13 LAB — POCT GLYCOSYLATED HEMOGLOBIN (HGB A1C): Hemoglobin A1C: 7.2 % — AB (ref 4.0–5.6)

## 2021-03-13 MED ORDER — TRUE METRIX BLOOD GLUCOSE TEST VI STRP: ORAL_STRIP | 12 refills | Status: DC

## 2021-03-13 MED ORDER — SILDENAFIL CITRATE 100 MG PO TABS: 50.0000 mg | ORAL_TABLET | Freq: Every day | ORAL | 11 refills | Status: DC | PRN

## 2021-03-13 MED FILL — ACCU-CHEK GUIDE TEST STRIP: 25 days supply | Qty: 100 | Fill #0

## 2021-03-13 MED FILL — ACCU-CHEK SOFTCLIX LANCETS: 25 days supply | Qty: 100 | Fill #0

## 2021-03-13 MED FILL — ACCU-CHEK GUIDE W/DEVICE KI: W/DEVICE | 1 days supply | Qty: 1 | Fill #0

## 2021-03-13 NOTE — Patient Instructions (Signed)
Medication Instructions:  NO CHANGE  *If you need a refill on your cardiac medications before your next appointment, please call your pharmacy*   Lab Work: NONE If you have labs (blood work) drawn today and your tests are completely normal, you will receive your results only by: [name] MyChart Message (if you have MyChart) OR . A paper copy in the mail If you have any lab test that is abnormal or we need to change your treatment, we will call you to review the results.   Testing/Procedures: Your physician has requested that you have an echocardiogram. Echocardiography is a painless test that uses sound waves to create images of your heart. It provides your doctor with information about the size and shape of your heart and how well your heart's chambers and valves are working. This procedure takes approximately one hour. There are no restrictions for this procedure. -- [address] - [address] Floor   Follow-Up: At [facility], you and your health needs are our priority.  As part of our continuing mission to provide you with exceptional heart care, we have created designated Provider Care Teams.  These Care Teams include your primary Cardiologist (physician) and Advanced Practice Providers (APPs -  Physician Assistants and Nurse Practitioners) who all work together to provide you with the care you need, when you need it.  We recommend signing up for the patient portal called "MyChart".  Sign up information is provided on this After Visit Summary.  MyChart is used to connect with patients for Virtual Visits (Telemedicine).  Patients are able to view lab/test results, encounter notes, upcoming appointments, etc.  Non-urgent messages can be sent to your provider as well.   To learn more about what you can do with MyChart, go to [email or web address].    Your next appointment:   4-6 weeks - after echo  The format for your next appointment:   In Person  Provider:   You may see Dr. Debara Pickett  or one of the following Advanced Practice Providers on your designated Care Team:    Almyra Deforest, PA-C  Fabian Sharp, [place] or   Roby Lofts, [place]    Other Instructions

## 2021-03-13 NOTE — Progress Notes (Signed)
OFFICE CONSULT NOTE  Chief Complaint:  Irregular heartbeat  Primary Care Physician: Vevelyn Francois, NP  HPI:  Kyle Merritt is a 60 y.o. male who is being seen today for the evaluation of irregular heartbeat at the request of Vevelyn Francois, NP.  Kyle Merritt is a 60 year old male who currently is referred today by Dionisio David, NP.  In fact he was seen in the office this morning and noted to have an irregular heart rhythm.  There was a cancellation in the schedule today and he was sent over to the office.  Kyle Merritt is completely asymptomatic.  He was not aware that he had any irregular rhythms.  An EKG there was not performed however we did get an EKG here which shows sinus rhythm with PVCs.  It was also noted that he had missed beats on physical exam.  The EKG shows a pattern of bigeminy.  He denies any chest pain or shortness of breath.  He reports his father had a pacemaker.  He also had a brother with an enlarged heart.  Other medical problems include type 2 diabetes, hypertension, aortic atherosclerosis, and dyslipidemia.  PMHx:  Past Medical History:  Diagnosis Date  . Arthritis   . Colon cancer ([place])   . Diabetes mellitus without complication ([place])   . High cholesterol   . History of colon cancer   . History of colon cancer 2004  . Hypertension     Past Surgical History:  Procedure Laterality Date  . COLON SURGERY    . KNEE ARTHROSCOPY Bilateral   . 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 TRIGGER FINGER RELEASE Right 05/22/2019    FAMHx:  Family History  Problem Relation Age of Onset  . Diabetes Mother   . Hypertension Mother   . Colon cancer Paternal Uncle   . Esophageal cancer Neg Hx   . Inflammatory bowel disease Neg Hx   . Liver disease Neg Hx   . Pancreatic cancer Neg Hx   . Stomach cancer Neg Hx     SOCHx:   reports  that he quit smoking about 4 years ago. He has never used smokeless tobacco. He reports that he does not drink alcohol and does not use drugs.  ALLERGIES:  Allergies  Allergen Reactions  . Crab [Shellfish Allergy]     itching    ROS: Pertinent items noted in HPI and remainder of comprehensive ROS otherwise negative.  HOME MEDS: Current Outpatient Medications on File Prior to Visit  Medication Sig Dispense Refill  . albuterol (VENTOLIN HFA) 108 (90 Base) MCG/ACT inhaler Inhale 2 puffs into the lungs every 6 (six) hours as needed for wheezing or shortness of breath. 8 g 2  . atorvastatin (LIPITOR) 40 MG tablet Take 1 tablet (40 mg total) by mouth every evening. 90 tablet 3  . blood glucose meter kit and supplies KIT Dispense based on patient and insurance preference. Use up to four times daily as directed. (FOR ICD-10  E11.9). 1 each 0  . empagliflozin (JARDIANCE) 10 MG TABS tablet Take 1 tablet (10 mg total) by mouth daily. 90 tablet 3  . glucose blood (TRUE METRIX BLOOD GLUCOSE TEST) test strip Use as instructed 100 each 12  . ketorolac (ACULAR) 0.4 % SOLN     . lisinopril-hydrochlorothiazide (ZESTORETIC) 20-12.5 MG tablet Take 1 tablet by mouth daily. 90 tablet  3  . metFORMIN (GLUCOPHAGE) 1000 MG tablet Take 1 tablet (1,000 mg total) by mouth 2 (two) times daily with a meal. 180 tablet 3  . omeprazole (PRILOSEC) 40 MG capsule Take 1 capsule (40 mg total) by mouth daily. 90 capsule 3  . sildenafil (VIAGRA) 100 MG tablet Take 0.5 tablets (50 mg total) by mouth daily as needed for erectile dysfunction. 10 tablet 11  . tamsulosin (FLOMAX) 0.4 MG CAPS capsule Take 1 capsule (0.4 mg total) by mouth daily. 90 capsule 3  . valACYclovir (VALTREX) 500 MG tablet Take 500 mg by mouth 3 (three) times daily.     No current facility-administered medications on file prior to visit.    LABS/IMAGING: Results for orders placed or performed in visit on 03/13/21 (from the past 48 hour(s))  POCT  glycosylated hemoglobin (Hb A1C)     Status: Abnormal   Collection Time: 03/13/21  8:44 AM  Result Value Ref Range   Hemoglobin A1C 7.2 (A) 4.0 - 5.6 %   HbA1c POC (<> result, manual entry)     HbA1c, POC (prediabetic range)     HbA1c, POC (controlled diabetic range)    POCT URINALYSIS DIP (CLINITEK)     Status: Normal   Collection Time: 03/13/21 10:31 AM  Result Value Ref Range   Color, UA yellow yellow   Clarity, UA clear clear   Glucose, UA negative negative mg/dL   Bilirubin, UA negative negative   Ketones, POC UA negative negative mg/dL   Spec Grav, UA 1.015 1.010 - 1.025   Blood, UA negative negative   pH, UA 5.0 5.0 - 8.0   POC PROTEIN,UA negative negative, trace   Urobilinogen, UA 0.2 0.2 or 1.0 E.U./dL   Nitrite, UA Negative Negative   Leukocytes, UA Negative Negative   No results found.  LIPID PANEL:    Component Value Date/Time   CHOL 199 08/15/2020 1047   TRIG 96 08/15/2020 1047   HDL 66 08/15/2020 1047   CHOLHDL 3.0 08/15/2020 1047   CHOLHDL 4.0 08/02/2014 1151   VLDL 33 08/02/2014 1151   LDLCALC 116 (H) 08/15/2020 1047    WEIGHTS: Wt Readings from Last 3 Encounters:  03/13/21 233 lb 12.8 oz (106.1 kg)  03/13/21 233 lb (105.7 kg)  12/12/20 231 lb (104.8 kg)    VITALS: BP (!) 150/82   Pulse 74   Ht 6' (1.829 m)   Wt 233 lb 12.8 oz (106.1 kg)   SpO2 97%   BMI 31.71 kg/m   EXAM: General appearance: alert and no distress Neck: no carotid bruit, no JVD and thyroid not enlarged, symmetric, no tenderness/mass/nodules Lungs: clear to auscultation bilaterally Heart: regularly irregular rhythm Abdomen: soft, non-tender; bowel sounds normal; no masses,  no organomegaly Extremities: extremities normal, atraumatic, no cyanosis or edema Pulses: 2+ and symmetric Skin: Skin color, texture, turgor normal. No rashes or lesions Neurologic: Grossly normal Psych: Pleasant  EKG: Sinus rhythm with frequent PVCs and bigeminal pattern at 67, inferior T wave  changes- personally reviewed  ASSESSMENT: 1. 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 Type 2 diabetes 3. Hypertension 4. Dyslipidemia  PLAN: 1.   Kyle Merritt has frequent PVCs in a bigeminal pattern.  He is unaware of this symptomatically.  He has multiple cardiovascular risk factors and a family history of heart disease.  I would like to get an echocardiogram to evaluate for any decreased LV function.  Ultimately he may need an ischemia evaluation as well.  Plan follow-up with me afterwards.  Thanks for the kind referral.  Pixie Casino, MD, FACC, [place] Director of the [facility] Diplomate of the American Board of Clinical Lipidology Attending Cardiologist  Direct Dial: [phone]  Fax: [phone]  Website:  [email or web address] Trent Theisen 03/13/2021, 1:23 PM

## 2021-03-13 NOTE — Patient Instructions (Addendum)
Please contact your Opthalmaologist and schedule a Diabetic Eye Exam.   Diabetes Mellitus and Nutrition, Adult When you have diabetes, or diabetes mellitus, it is very important to have healthy eating habits because your blood sugar (glucose) levels are greatly affected by what you eat and drink. Eating healthy foods in the right amounts, at about the same times every day, can help you:  Control your blood glucose.  Lower your risk of heart disease.  Improve your blood pressure.  Reach or maintain a healthy weight. What can affect my meal plan? Every person with diabetes is different, and each person has different needs for a meal plan. Your health care provider may recommend that you work with a dietitian to make a meal plan that is best for you. Your meal plan may vary depending on factors such as:  The calories you need.  The medicines you take.  Your weight.  Your blood glucose, blood pressure, and cholesterol levels.  Your activity level.  Other health conditions you have, such as heart or kidney disease. How do carbohydrates affect me? Carbohydrates, also called carbs, affect your blood glucose level more than any other type of food. Eating carbs naturally raises the amount of glucose in your blood. Carb counting is a method for keeping track of how many carbs you eat. Counting carbs is important to keep your blood glucose at a healthy level, especially if you use insulin or take certain oral diabetes medicines. It is important to know how many carbs you can safely have in each meal. This is different for every person. Your dietitian can help you calculate how many carbs you should have at each meal and for each snack. How does alcohol affect me? Alcohol can cause a sudden decrease in blood glucose (hypoglycemia), especially if you use insulin or take certain oral diabetes medicines. Hypoglycemia can be a life-threatening condition. Symptoms of hypoglycemia, such as sleepiness,  dizziness, and confusion, are similar to symptoms of having too much alcohol.  Do not drink alcohol if: ? Your health care provider tells you not to drink. ? You are pregnant, may be pregnant, or are planning to become pregnant.  If you drink alcohol: ? Do not drink on an empty stomach. ? Limit how much you use to:  0-1 drink a day for women.  0-2 drinks a day for men. ? Be aware of how much alcohol is in your drink. In the [place] one drink equals one 12 oz bottle of beer (355 mL), one 5 oz glass of wine (148 mL), or one 1 oz glass of hard liquor (44 mL). ? Keep yourself hydrated with water, diet soda, or unsweetened iced tea.  Keep in mind that regular soda, juice, and other mixers may contain a lot of sugar and must be counted as carbs. What are tips for following this plan? Reading food labels  Start by checking the serving size on the "Nutrition Facts" label of packaged foods and drinks. The amount of calories, carbs, fats, and other nutrients listed on the label is based on one serving of the item. Many items contain more than one serving per package.  Check the total grams (g) of carbs in one serving. You can calculate the number of servings of carbs in one serving by dividing the total carbs by 15. For example, if a food has 30 g of total carbs per serving, it would be equal to 2 servings of carbs.  Check the number of grams (g) of  saturated fats and trans fats in one serving. Choose foods that have a low amount or none of these fats.  Check the number of milligrams (mg) of salt (sodium) in one serving. Most people should limit total sodium intake to less than 2,300 mg per day.  Always check the nutrition information of foods labeled as "low-fat" or "nonfat." These foods may be higher in added sugar or refined carbs and should be avoided.  Talk to your dietitian to identify your daily goals for nutrients listed on the label. Shopping  Avoid buying canned, pre-made, or  processed foods. These foods tend to be high in fat, sodium, and added sugar.  Shop around the outside edge of the grocery store. This is where you will most often find fresh fruits and vegetables, bulk grains, fresh meats, and fresh dairy. Cooking  Use low-heat cooking methods, such as baking, instead of high-heat cooking methods like deep frying.  Cook using healthy oils, such as olive, canola, or sunflower oil.  Avoid cooking with butter, cream, or high-fat meats. Meal planning  Eat meals and snacks regularly, preferably at the same times every day. Avoid going long periods of time without eating.  Eat foods that are high in fiber, such as fresh fruits, vegetables, beans, and whole grains. Talk with your dietitian about how many servings of carbs you can eat at each meal.  Eat 4-6 oz (112-168 g) of lean protein each day, such as lean meat, chicken, fish, eggs, or tofu. One ounce (oz) of lean protein is equal to: ? 1 oz (28 g) of meat, chicken, or fish. ? 1 egg. ?  cup (62 g) of tofu.  Eat some foods each day that contain healthy fats, such as avocado, nuts, seeds, and fish.   What foods should I eat? Fruits Berries. Apples. Oranges. Peaches. Apricots. Plums. Grapes. Mango. Papaya. Pomegranate. Kiwi. Cherries. Vegetables Lettuce. Spinach. Leafy greens, including kale, chard, collard greens, and mustard greens. Beets. Cauliflower. Cabbage. Broccoli. Carrots. Green beans. Tomatoes. Peppers. Onions. Cucumbers. Brussels sprouts. Grains Whole grains, such as whole-wheat or whole-grain bread, crackers, tortillas, cereal, and pasta. Unsweetened oatmeal. Quinoa. Brown or wild rice. Meats and other proteins Seafood. Poultry without skin. Lean cuts of poultry and beef. Tofu. Nuts. Seeds. Dairy Low-fat or fat-free dairy products such as milk, yogurt, and cheese. The items listed above may not be a complete list of foods and beverages you can eat. Contact a dietitian for more  information. What foods should I avoid? Fruits Fruits canned with syrup. Vegetables Canned vegetables. Frozen vegetables with butter or cream sauce. Grains Refined white flour and flour products such as bread, pasta, snack foods, and cereals. Avoid all processed foods. Meats and other proteins Fatty cuts of meat. Poultry with skin. Breaded or fried meats. Processed meat. Avoid saturated fats. Dairy Full-fat yogurt, cheese, or milk. Beverages Sweetened drinks, such as soda or iced tea. The items listed above may not be a complete list of foods and beverages you should avoid. Contact a dietitian for more information. Questions to ask a health care provider  Do I need to meet with a diabetes educator?  Do I need to meet with a dietitian?  What number can I call if I have questions?  When are the best times to check my blood glucose? Where to find more information:  American Diabetes Association: diabetes.org  Academy of Nutrition and Dietetics: www.eatright.[employer] of Diabetes and Digestive and Kidney Diseases: [employer]  Association of Diabetes Care and  Education Specialists: www.diabeteseducator.org Summary  It is important to have healthy eating habits because your blood sugar (glucose) levels are greatly affected by what you eat and drink.  A healthy meal plan will help you control your blood glucose and maintain a healthy lifestyle.  Your health care provider may recommend that you work with a dietitian to make a meal plan that is best for you.  Keep in mind that carbohydrates (carbs) and alcohol have immediate effects on your blood glucose levels. It is important to count carbs and to use alcohol carefully. This information is not intended to replace advice given to you by your health care provider. Make sure you discuss any questions you have with your health care provider. Document Revised: 11/17/2019 Document Reviewed: 11/17/2019 Elsevier  Patient Education  2021 [employer].

## 2021-03-13 NOTE — Progress Notes (Signed)
Kyle Merritt, [place]  [address] Phone:  [phone]   Fax:  [phone]   Established Patient Office Visit  Subjective:  Patient ID: Kyle Merritt, male    DOB: October 22, 1961  Age: 60 y.o. MRN: [id number]  CC:  Chief Complaint  Patient presents with  . Diabetes    HPI Kyle Merritt presents for follow up. He  has a past medical history of Arthritis, Colon cancer ([place]), Diabetes mellitus without complication ([place]), High cholesterol, History of colon cancer, History of colon cancer (2004), and Hypertension.   Diabetes Mellitus Patient presents for follow up of diabetes. Current symptoms include: hyperglycemia and visual disturbances. Symptoms have stabilized. Patient denies foot ulcerations, hypoglycemia , increased appetite, nausea, paresthesia of the feet, polydipsia and polyuria. Evaluation to date has included: fasting blood sugar, fasting lipid panel, hemoglobin A1C and microalbuminuria.  Home sugars: BGs have been labile ranging between 100 and 160. Current treatment: more intensive attention to diet which has been effective, Continued metformin which has been effective, Continued statin which has been effective and Continued ACE inhibitor/ARB which has been effective. Last dilated eye exam: 2022.  He admits that the Cialis is not effective.  He is requesting to be back on the Viagra.   Past Medical History:  Diagnosis Date  . Arthritis   . Colon cancer ([place])   . Diabetes mellitus without complication ([place])   . High cholesterol   . History of colon cancer   . History of colon cancer 2004  . Hypertension     Past Surgical History:  Procedure Laterality Date  . COLON SURGERY    . KNEE ARTHROSCOPY Bilateral   . 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 TRIGGER FINGER RELEASE Right 05/22/2019     Family History  Problem Relation Age of Onset  . Diabetes Mother   . Hypertension Mother   . Colon cancer Paternal Uncle   . Esophageal cancer Neg Hx   . Inflammatory bowel disease Neg Hx   . Liver disease Neg Hx   . Pancreatic cancer Neg Hx   . Stomach cancer Neg Hx     Social History   Socioeconomic History  . Marital status: Divorced    Spouse name: Not on file  . Number of children: Not on file  . Years of education: Not on file  . Highest education level: Not on file  Occupational History  . Not on file  Tobacco Use  . Smoking status: Former Smoker    Quit date: 2018    Years since quitting: 4.2  . Smokeless tobacco: Never Used  Vaping Use  . Vaping Use: Never used  Substance and Sexual Activity  . Alcohol use: No  . Drug use: No  . Sexual activity: Yes  Other Topics Concern  . Not on file  Social History Narrative  . Not on file   Social Determinants of Health   Financial Resource Strain: Not on file  Food Insecurity: Not on file  Transportation Needs: Not on file  Physical Activity: Not on file  Stress: Not on file  Social Connections: Not on file  Intimate Partner Violence: Not on file    Outpatient Medications Prior to Visit  Medication Sig Dispense Refill  . albuterol (VENTOLIN HFA) 108 (90 Base) MCG/ACT inhaler Inhale 2 puffs into the lungs every  6 (six) hours as needed for wheezing or shortness of breath. 8 g 2  . atorvastatin (LIPITOR) 40 MG tablet Take 1 tablet (40 mg total) by mouth every evening. 90 tablet 3  . blood glucose meter kit and supplies KIT Dispense based on patient and insurance preference. Use up to four times daily as directed. (FOR ICD-10  E11.9). 1 each 0  . empagliflozin (JARDIANCE) 10 MG TABS tablet Take 1 tablet (10 mg total) by mouth daily. 90 tablet 3  . ketorolac (ACULAR) 0.4 % SOLN     . lisinopril-hydrochlorothiazide (ZESTORETIC) 20-12.5 MG tablet Take 1 tablet by mouth daily. 90 tablet 3  . metFORMIN (GLUCOPHAGE)  1000 MG tablet Take 1 tablet (1,000 mg total) by mouth 2 (two) times daily with a meal. 180 tablet 3  . omeprazole (PRILOSEC) 40 MG capsule Take 1 capsule (40 mg total) by mouth daily. 90 capsule 3  . tamsulosin (FLOMAX) 0.4 MG CAPS capsule Take 1 capsule (0.4 mg total) by mouth daily. 90 capsule 3  . ACCU-CHEK GUIDE test strip USE AS DIRECTED UP TO 4 TIMES DAILY. 100 each 0  . tadalafil (CIALIS) 20 MG tablet Take 1 tablet (20 mg total) by mouth daily as needed for erectile dysfunction. 10 tablet 0  . valACYclovir (VALTREX) 500 MG tablet Take 500 mg by mouth 3 (three) times daily.    [name] 0.9 %  sodium chloride infusion      No facility-administered medications prior to visit.    Allergies  Allergen Reactions  . Crab [Shellfish Allergy]     itching    ROS Review of Systems  Constitutional: Negative.   Respiratory: Negative for cough, chest tightness and shortness of breath.   Cardiovascular: Negative for chest pain.  Neurological: Negative for syncope.  All other systems reviewed and are negative.     Objective:    Physical Exam HENT:     Head: Normocephalic and atraumatic.     Nose: Nose normal.     Mouth/Throat:     Mouth: Mucous membranes are moist.  Cardiovascular:     Rate and Rhythm: Normal rate. Rhythm irregular.     Pulses: Normal pulses.     Heart sounds: Normal heart sounds.  Pulmonary:     Effort: Pulmonary effort is normal.     Breath sounds: Normal breath sounds.  Abdominal:     General: Bowel sounds are normal.     Palpations: Abdomen is soft.  Musculoskeletal:     Cervical back: Normal range of motion.  Skin:    General: Skin is warm.     Capillary Refill: Capillary refill takes less than 2 seconds.  Neurological:     General: No focal deficit present.     Mental Status: He is alert and oriented to person, place, and time.  Psychiatric:        Mood and Affect: Mood normal.        Behavior: Behavior normal.        Thought Content: Thought content  normal.        Judgment: Judgment normal.     BP (!) 147/79   Pulse 60   Ht 6' (1.829 m)   Wt 233 lb (105.7 kg)   SpO2 100%   BMI 31.60 kg/m  Wt Readings from Last 3 Encounters:  03/13/21 233 lb (105.7 kg)  12/12/20 231 lb (104.8 kg)  10/13/20 211 lb (95.7 kg)     Health Maintenance Due  Topic Date Due  .  FOOT EXAM  02/04/2021    There are no preventive care reminders to display for this patient.  Lab Results  Component Value Date   TSH 1.640 01/06/2020   Lab Results  Component Value Date   WBC 6.7 10/13/2020   HGB 14.3 10/13/2020   HCT 43.8 10/13/2020   MCV 80 10/13/2020   PLT 368 10/13/2020   Lab Results  Component Value Date   NA 139 10/13/2020   K 4.8 10/13/2020   CO2 25 01/11/2020   GLUCOSE 141 (H) 10/13/2020   BUN 26 10/13/2020   CREATININE 1.80 (H) 10/13/2020   BILITOT 1.0 10/13/2020   ALKPHOS 140 (H) 10/13/2020   AST 11 10/13/2020   ALT 9 01/11/2020   PROT 7.9 10/13/2020   ALBUMIN 4.9 (H) 10/13/2020   CALCIUM 10.2 10/13/2020   ANIONGAP 13 01/11/2020   Lab Results  Component Value Date   CHOL 199 08/15/2020   Lab Results  Component Value Date   HDL 66 08/15/2020   Lab Results  Component Value Date   LDLCALC 116 (H) 08/15/2020   Lab Results  Component Value Date   TRIG 96 08/15/2020   Lab Results  Component Value Date   CHOLHDL 3.0 08/15/2020   Lab Results  Component Value Date   HGBA1C 7.2 (A) 03/13/2021      Assessment & Plan:   Problem List Items Addressed This Visit      Cardiovascular and Mediastinum   Essential hypertension Stable Encouraged on going compliance with current medication regimen Encouraged home monitoring and recording BP <130/80 Eating a heart-healthy diet with less salt Encouraged regular physical activity  Recommend Weight loss    Relevant Medications   sildenafil (VIAGRA) 100 MG tablet   Other Relevant Orders   POCT glycosylated hemoglobin (Hb A1C) (Completed)     Digestive   Malignant  neoplasm of colon (HCC)   Relevant Medications   valACYclovir (VALTREX) 500 MG tablet     Endocrine   Diabetes ([place]) - Primary Controlled however A1c slightly up from 3 months ago Encourage compliance with current treatment regimen Encourage regular CBG monitoring Encourage contacting office if excessive hyperglycemia and or hypoglycemia Lifestyle modification with healthy diet (fewer calories, more high fiber foods, whole grains and non-starchy vegetables, lower fat meat and fish, low-fat diary include healthy oils) regular exercise (physical activity) and weight loss Opthalmology exam completed Home BP monitoring also encouraged goal <130/80   Relevant Orders   POCT glycosylated hemoglobin (Hb A1C) (Completed)   Comp. Metabolic Panel (12)   POCT URINALYSIS DIP (CLINITEK)   Microalbumin, urine     Other   Hyperlipidemia   Relevant Medications   sildenafil (VIAGRA) 100 MG tablet   Other Relevant Orders   Lipid panel    Other Visit Diagnoses    Stage 3a chronic kidney disease ([place])       Relevant Orders   POCT glycosylated hemoglobin (Hb A1C) (Completed)   Cardiac arrhythmia, unspecified cardiac arrhythmia type    Persistent encouraged patient to be evaluated by cardiology   Relevant Medications   sildenafil (VIAGRA) 100 MG tablet   Other Relevant Orders   Ambulatory referral to Cardiology      Meds ordered this encounter  Medications  . sildenafil (VIAGRA) 100 MG tablet    Sig: Take 0.5 tablets (50 mg total) by mouth daily as needed for erectile dysfunction.    Dispense:  10 tablet    Refill:  11    Order Specific Question:  Supervising Provider    Answer:   Tresa Garter [[id number]]    Follow-up: Return in about 6 months (around 09/13/2021).    Vevelyn Francois, NP

## 2021-03-14 LAB — MICROALBUMIN, URINE: Microalbumin, Urine: 5.1 ug/mL

## 2021-03-25 IMAGING — US US RENAL
1 series · 14 of 25 positions shown · non-contrast
Comparison: None.

CLINICAL DATA: Chronic kidney disease.

EXAM:
RENAL / URINARY TRACT ULTRASOUND COMPLETE

[Series 1: us renal · 0.22mm/px · 14 of 34 slices shown]
[im 1/34]
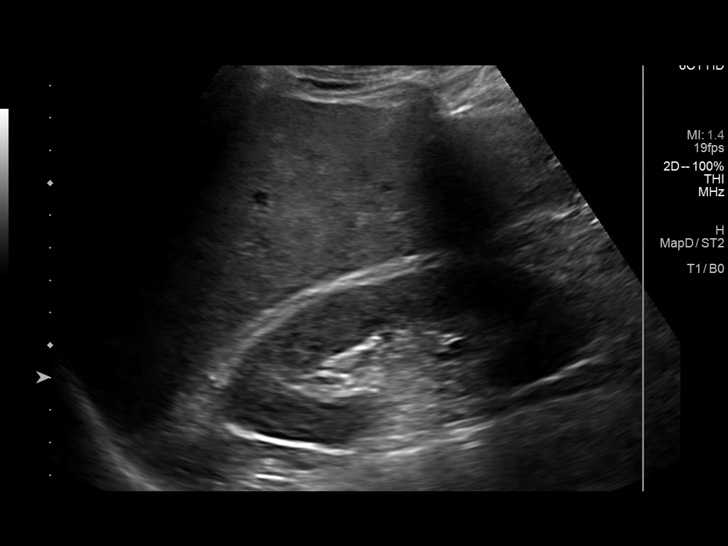
[im 3/34]
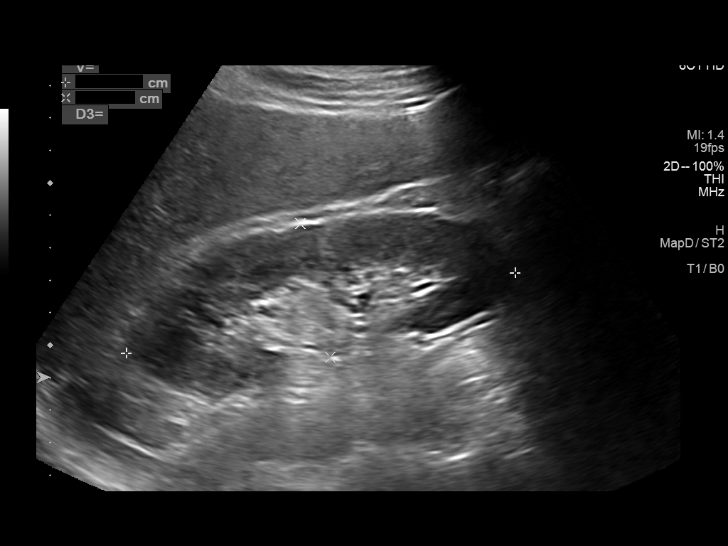
[im 6/34]
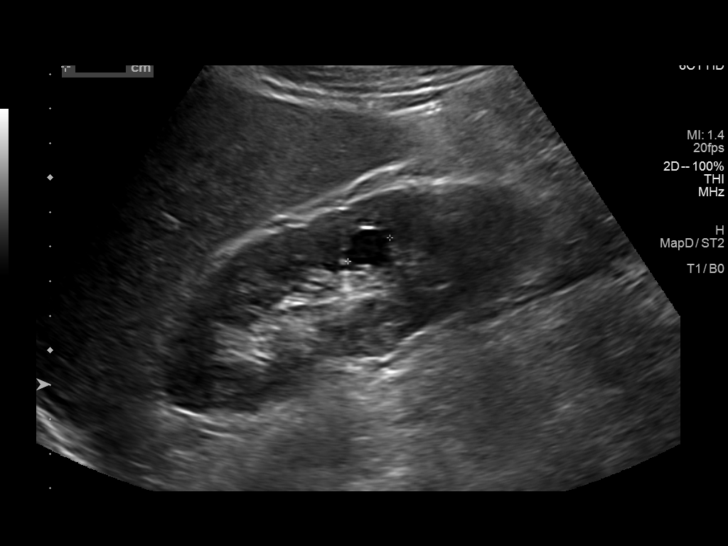
[im 9/34]
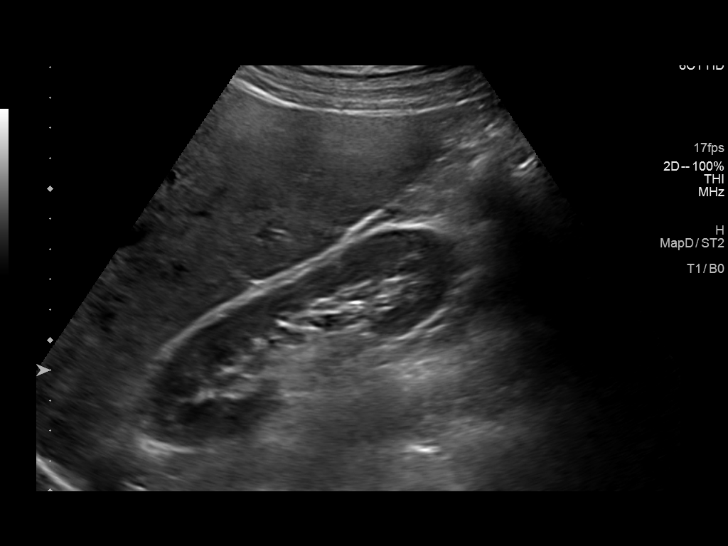
[im 12/34]
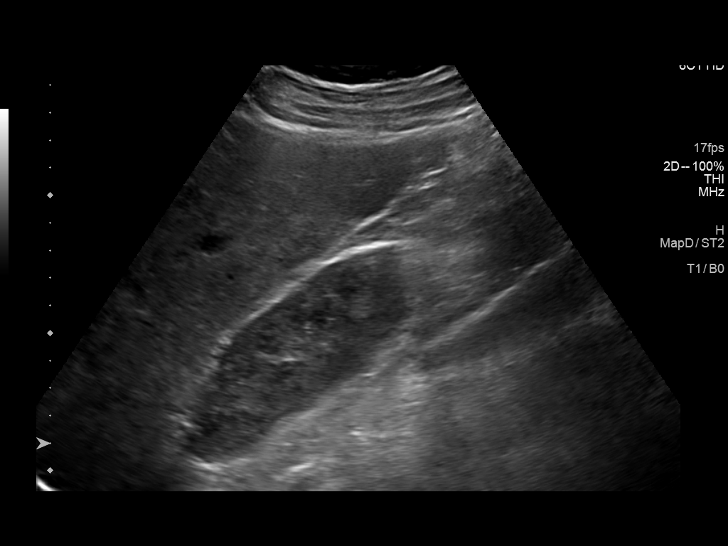
[im 13/34]
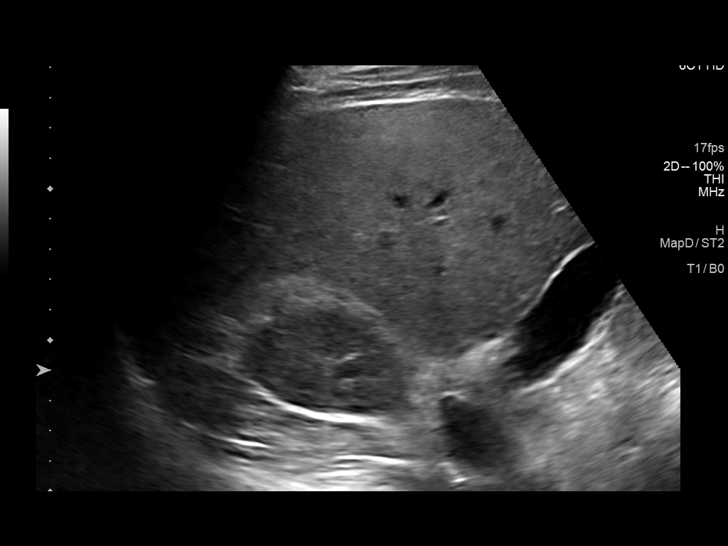
[im 16/34]
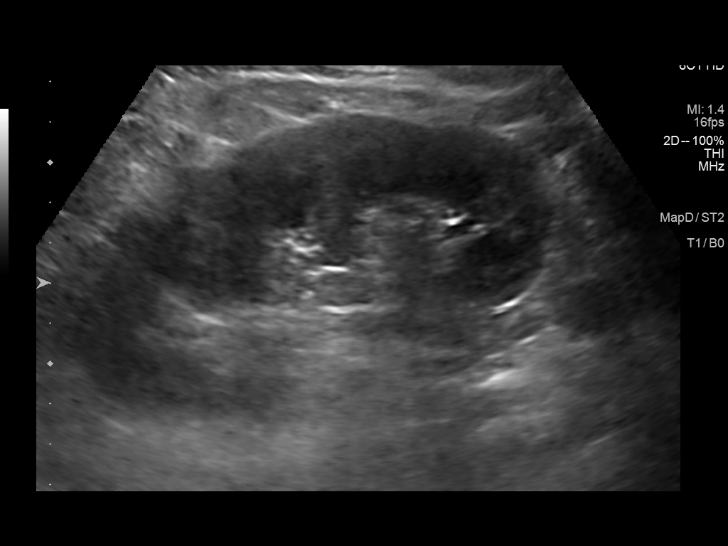
[im 18/34]
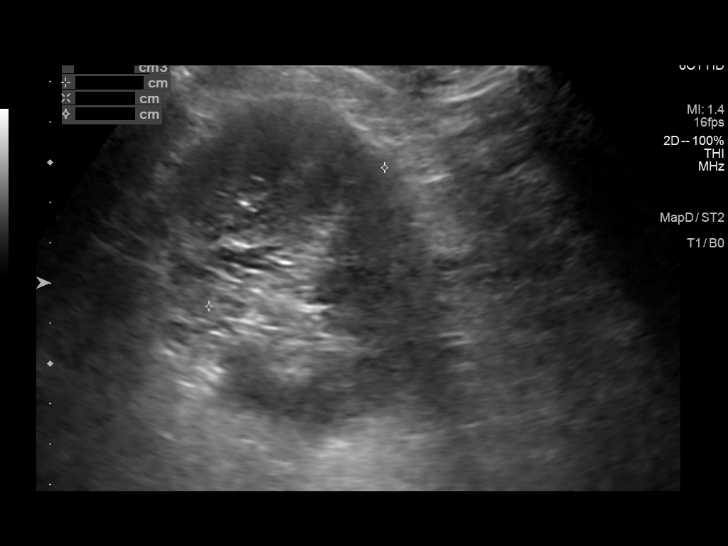
[im 21/34]
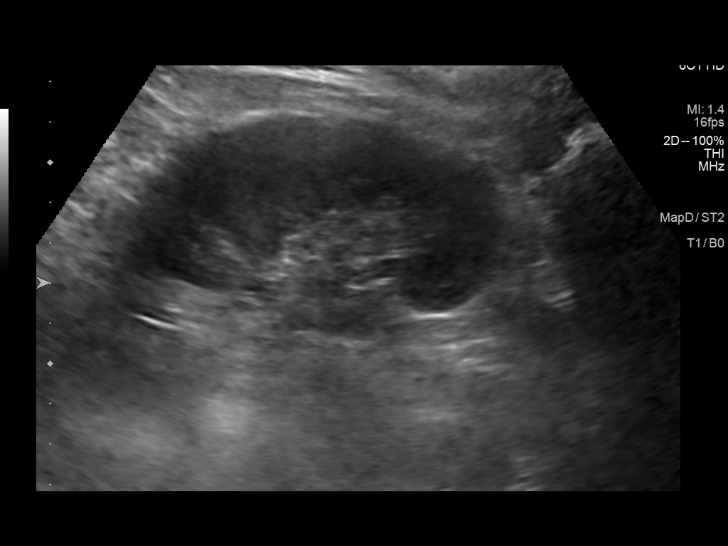
[im 23/34]
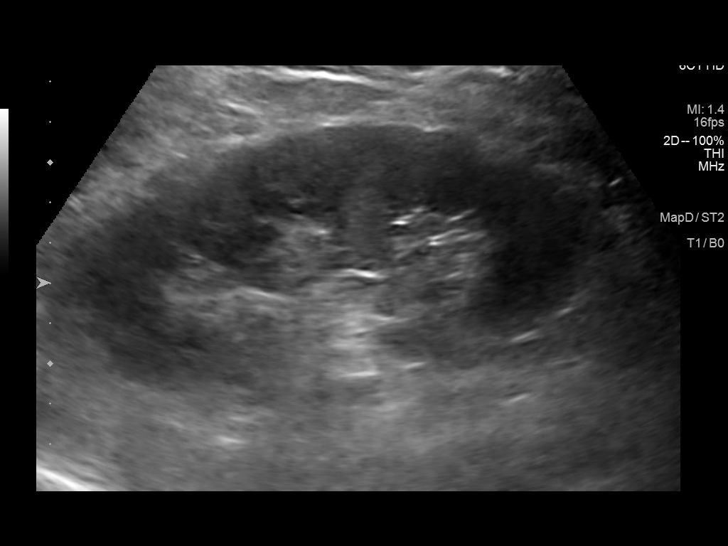
[im 25/34]
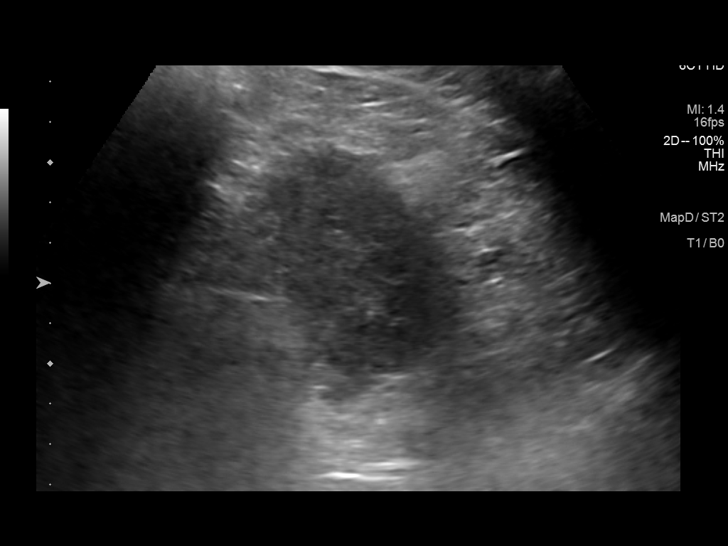
[im 28/34]
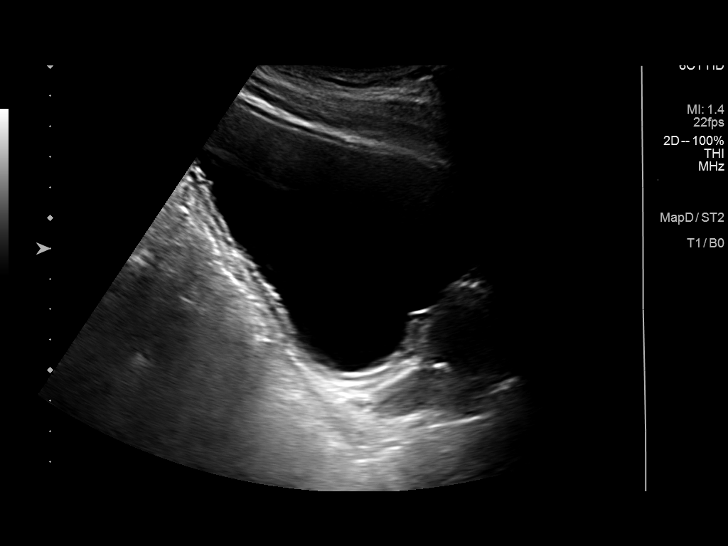
[im 31/34]
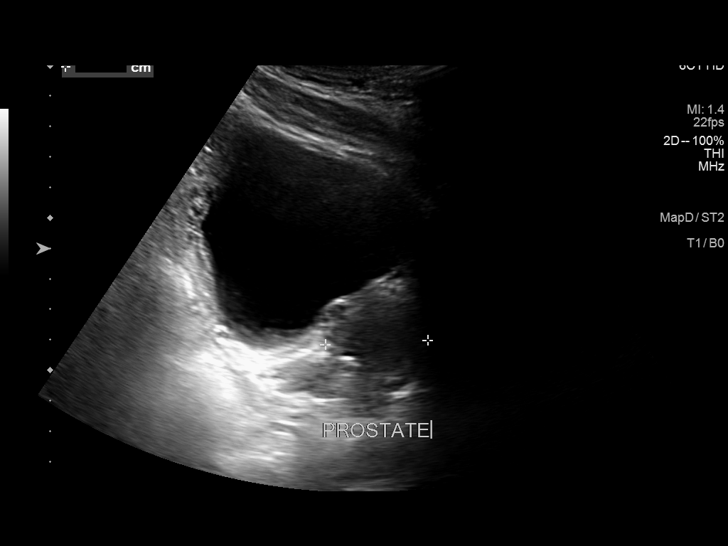
[im 34/34]
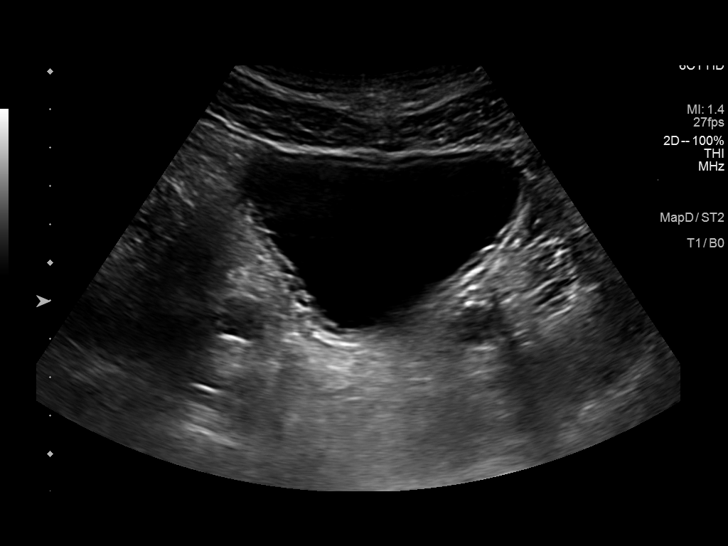

[14 of 25 positions shown; findings below may reference images not displayed]

FINDINGS: Right Kidney:

Renal measurements: 12.2 x 4.2 x 3.4 cm = volume: 144 mL. 14 mm
interpolar cyst evident. No hydronephrosis.

Left Kidney:

Renal measurements: 12.4 x 6.0 x 5.6 cm = volume: 215 mL.
Echogenicity within normal limits. No mass or hydronephrosis
visualized.

Bladder:

Appears normal for degree of bladder distention.

Other:

None.
IMPRESSION: 1. No evidence for hydronephrosis.
2. 14 mm cyst identified interpolar left kidney.

## 2021-04-10 ENCOUNTER — Ambulatory Visit (HOSPITAL_COMMUNITY): Payer: Medicaid Other

## 2021-04-13 ENCOUNTER — Telehealth (HOSPITAL_COMMUNITY): Payer: Self-pay | Admitting: Internal Medicine

## 2021-04-13 NOTE — Telephone Encounter (Signed)
Will forward to MD to make aware.

## 2021-04-13 NOTE — Telephone Encounter (Signed)
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 Thank you

## 2021-05-01 ENCOUNTER — Other Ambulatory Visit: Payer: Self-pay | Admitting: Nurse Practitioner

## 2021-05-01 ENCOUNTER — Other Ambulatory Visit: Payer: Self-pay

## 2021-05-01 MED ORDER — EMPAGLIFLOZIN 10 MG PO TABS
ORAL_TABLET | Freq: Every day | ORAL | 5 refills | Status: AC
  Filled 2021-05-01: qty 30, 30d supply, fill #0
  Filled 2021-08-09: qty 30, 30d supply, fill #1

## 2021-05-01 MED FILL — Tamsulosin HCl Cap 0.4 MG: ORAL | 90 days supply | Qty: 90 | Fill #0 | Status: AC

## 2021-05-01 MED FILL — Lisinopril & Hydrochlorothiazide Tab 20-12.5 MG: ORAL | 90 days supply | Qty: 90 | Fill #0 | Status: AC

## 2021-05-01 MED FILL — Metformin HCl Tab 1000 MG: ORAL | 90 days supply | Qty: 180 | Fill #0 | Status: AC

## 2021-05-02 ENCOUNTER — Telehealth: Payer: Self-pay

## 2021-05-02 ENCOUNTER — Other Ambulatory Visit: Payer: Self-pay

## 2021-05-02 NOTE — Telephone Encounter (Signed)
Pt wants a call back to see he has refill on his viagra??

## 2021-05-02 NOTE — Telephone Encounter (Signed)
Pharmacy confirmed patient has refills. Patient aware

## 2021-05-08 ENCOUNTER — Telehealth: Payer: Self-pay

## 2021-05-08 NOTE — Telephone Encounter (Signed)
Results have been faxed

## 2021-05-08 NOTE — Telephone Encounter (Signed)
Pt need a copy of his A1 C   His new number [phone]  Fax number [phone] Fast med for DOT physical

## 2021-05-10 ENCOUNTER — Other Ambulatory Visit: Payer: Self-pay

## 2021-05-10 ENCOUNTER — Ambulatory Visit (INDEPENDENT_AMBULATORY_CARE_PROVIDER_SITE_OTHER): Payer: Medicaid Other | Admitting: Internal Medicine

## 2021-05-10 ENCOUNTER — Encounter: Payer: Self-pay | Admitting: Internal Medicine

## 2021-05-10 VITALS — BP 112/66 | HR 67 | Ht 72.0 in | Wt 226.0 lb

## 2021-05-10 DIAGNOSIS — I493 Ventricular premature depolarization: Secondary | ICD-10-CM

## 2021-05-10 DIAGNOSIS — E119 Type 2 diabetes mellitus without complications: Secondary | ICD-10-CM | POA: Diagnosis not present

## 2021-05-10 DIAGNOSIS — I10 Essential (primary) hypertension: Secondary | ICD-10-CM

## 2021-05-10 NOTE — Progress Notes (Signed)
OFFICE CONSULT NOTE  Chief Complaint:  Irregular heartbeat  Primary Care Physician: Vevelyn Francois, NP  HPI:  Kyle Merritt is a 60 y.o. male who is being seen today for the evaluation of irregular heartbeat at the request of Vevelyn Francois, NP.  Kyle Merritt is a 60 year old male who currently is referred today by Dionisio David, NP.  In fact he was seen in the office this morning and noted to have an irregular heart rhythm.  There was a cancellation in the schedule today and he was sent over to the office.  Kyle Merritt is completely asymptomatic.  He was not aware that he had any irregular rhythms.  An EKG there was not performed however we did get an EKG here which shows sinus rhythm with PVCs.  It was also noted that he had missed beats on physical exam.  The EKG shows a pattern of bigeminy.  He denies any chest pain or shortness of breath.  He reports his father had a pacemaker.  He also had a brother with an enlarged heart.  Other medical problems include type 2 diabetes, hypertension, aortic atherosclerosis, and dyslipidemia.  05/10/2021  Kyle Merritt returns today for follow-up.  He was noted to have frequent PVCs at his last visit however today they are not present.  He says he thinks that they have gone away.  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  He is also made some changes in his diet.  His A1c is improved from 7.2-6 5%.  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  Pressure appears well controlled.  He denies shortness of breath or chest pain.  PMHx:  Past Medical History:  Diagnosis Date  . Arthritis   . Colon cancer ([place])   . Diabetes mellitus without complication ([place])   . High cholesterol   . History of colon cancer   . History of colon cancer 2004  . Hypertension     Past Surgical History:  Procedure Laterality Date  . COLON SURGERY    . KNEE ARTHROSCOPY Bilateral    . 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 TRIGGER FINGER RELEASE Right 05/22/2019    FAMHx:  Family History  Problem Relation Age of Onset  . Diabetes Mother   . Hypertension Mother   . Colon cancer Paternal Uncle   . Esophageal cancer Neg Hx   . Inflammatory bowel disease Neg Hx   . Liver disease Neg Hx   . Pancreatic cancer Neg Hx   . Stomach cancer Neg Hx     SOCHx:   reports that he quit smoking about 4 years ago. He has never used smokeless tobacco. He reports that he does not drink alcohol and does not use drugs.  ALLERGIES:  Allergies  Allergen Reactions  . Crab [Shellfish Allergy]     itching    ROS: Pertinent items noted in HPI and remainder of comprehensive ROS otherwise negative.  HOME MEDS: Current Outpatient Medications on File Prior to Visit  Medication Sig Dispense Refill  . Accu-Chek Softclix Lancets lancets USE AS INSTRUCTED 100 each 12  . atorvastatin (LIPITOR) 40 MG tablet Take 1 tablet (40 mg total) by mouth every evening. 90 tablet 3  . blood glucose meter kit  and supplies KIT Dispense based on patient and insurance preference. Use up to four times daily as directed. (FOR ICD-10  E11.9). 1 each 0  . Blood Glucose Monitoring Suppl (ACCU-CHEK GUIDE) w/Device KIT USE AS INSTRUCTED 1 kit 0  . dorzolamide-timolol (COSOPT) 22.3-6.8 MG/ML ophthalmic solution INSTILL 1 DROP INTO RIGHT EYE TWICE A DAY 10 mL 11  . empagliflozin (JARDIANCE) 10 MG TABS tablet TAKE 1 TABLET BY MOUTH DAILY. 30 tablet 5  . glucose blood (TRUE METRIX BLOOD GLUCOSE TEST) test strip Use as instructed 100 each 12  . glucose blood test strip USE AS INSTRUCTED 100 strip 12  . lisinopril-hydrochlorothiazide (ZESTORETIC) 20-12.5 MG tablet TAKE 1 TABLET BY MOUTH DAILY. 90 tablet 3  . metFORMIN (GLUCOPHAGE) 1000 MG tablet Take 1 tablet (1,000 mg total) by  mouth 2 (two) times daily with a meal. 180 tablet 3  . metFORMIN (GLUCOPHAGE) 1000 MG tablet TAKE 1 TABLET (1,000 MG TOTAL) BY MOUTH 2 (TWO) TIMES DAILY WITH A MEAL. 180 tablet 3  . omeprazole (PRILOSEC) 40 MG capsule Take 1 capsule (40 mg total) by mouth daily. 90 capsule 3  . prednisoLONE acetate (PRED FORTE) 1 % ophthalmic suspension INSTILL 1 DROP INTO RIGHT EYE FOUR TIMES A DAY 10 mL 0  . prednisoLONE acetate (PRED FORTE) 1 % ophthalmic suspension INSTILL 1 DROP INTO RIGHT EYE AS DIRECTED EVERY HOUR WHILE AWAKE TODAY, THEN STARTING TOMORROW EVERY 2 HOURS 5 mL 0  . sildenafil (VIAGRA) 100 MG tablet Take 0.5 tablets (50 mg total) by mouth daily as needed for erectile dysfunction. 10 tablet 11  . tamsulosin (FLOMAX) 0.4 MG CAPS capsule Take 1 capsule (0.4 mg total) by mouth daily. 90 capsule 3  . tamsulosin (FLOMAX) 0.4 MG CAPS capsule TAKE 1 CAPSULE (0.4 MG TOTAL) BY MOUTH DAILY. 90 capsule 3  . valACYclovir (VALTREX) 500 MG tablet Take 500 mg by mouth 3 (three) times daily.     No current facility-administered medications on file prior to visit.    LABS/IMAGING: No results found for this or any previous visit (from the past 48 hour(s)). No results found.  LIPID PANEL:    Component Value Date/Time   CHOL 199 08/15/2020 1047   TRIG 96 08/15/2020 1047   HDL 66 08/15/2020 1047   CHOLHDL 3.0 08/15/2020 1047   CHOLHDL 4.0 08/02/2014 1151   VLDL 33 08/02/2014 1151   LDLCALC 116 (H) 08/15/2020 1047    WEIGHTS: Wt Readings from Last 3 Encounters:  05/10/21 226 lb (102.5 kg)  03/13/21 233 lb 12.8 oz (106.1 kg)  03/13/21 233 lb (105.7 kg)    VITALS: BP 112/66   Pulse 67   Ht 6' (1.829 m)   Wt 226 lb (102.5 kg)   SpO2 99%   BMI 30.65 kg/m   EXAM: General appearance: alert and no distress Neck: no carotid bruit, no JVD and thyroid not enlarged, symmetric, no tenderness/mass/nodules Lungs: clear to auscultation bilaterally Heart: regular rate and rhythm, S1, S2 normal, no  murmur, click, rub or gallop Abdomen: soft, non-tender; bowel sounds normal; no masses,  no organomegaly Extremities: extremities normal, atraumatic, no cyanosis or edema Pulses: 2+ and symmetric Skin: Skin color, texture, turgor normal. No rashes or lesions Neurologic: Grossly normal Psych: Pleasant  EKG: Sinus rhythm at 65-personally reviewed  ASSESSMENT: 1. 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 Type 2 diabetes - 6.5% 3. Hypertension 4. Dyslipidemia  PLAN: 1.   Kyle Merritt to have had resolution of his frequent PVCs.  He denies any palpitations, chest pain or worsening shortness of breath.  His diabetes is better with A1c now 6.5.  He was just recently cleared for DOT to drive.  Blood pressure is well controlled.  Plan follow-up with me annually or sooner as necessary  Pixie Casino, MD, [facility], [place] Director of the [facility]  [facility] Diplomate of the American Board of Clinical Lipidology Attending Cardiologist  Direct Dial: [phone]  Fax: [phone]  Website:  [email or web address] 05/10/2021, 11:33 AM

## 2021-05-10 NOTE — Patient Instructions (Signed)

## 2021-05-25 ENCOUNTER — Ambulatory Visit: Payer: Medicaid Other | Admitting: Nurse Practitioner

## 2021-06-06 ENCOUNTER — Other Ambulatory Visit: Payer: Self-pay

## 2021-06-06 MED ORDER — LOTEPREDNOL ETABONATE 0.5 % OP SUSP
OPHTHALMIC | 1 refills | Status: DC
  Filled 2021-06-06: qty 5, 37d supply, fill #0
  Filled 2021-06-08: qty 5, 30d supply, fill #0

## 2021-06-06 MED ORDER — TIMOLOL MALEATE 0.5 % OP SOLN
OPHTHALMIC | 3 refills | Status: DC
  Filled 2021-06-06: qty 10, 90d supply, fill #0
  Filled 2021-08-09: qty 10, 90d supply, fill #1

## 2021-06-07 ENCOUNTER — Other Ambulatory Visit: Payer: Self-pay

## 2021-06-07 MED ORDER — EYSUVIS 0.25 % OP SUSP
OPHTHALMIC | 1 refills | Status: DC
  Filled 2021-06-07: qty 8.3, 31d supply, fill #0
  Filled 2021-08-09: qty 8.3, 34d supply, fill #1

## 2021-06-08 ENCOUNTER — Other Ambulatory Visit: Payer: Self-pay

## 2021-06-09 ENCOUNTER — Other Ambulatory Visit: Payer: Self-pay

## 2021-06-14 ENCOUNTER — Ambulatory Visit: Payer: Medicaid Other | Admitting: Nurse Practitioner

## 2021-07-07 ENCOUNTER — Ambulatory Visit: Payer: Medicaid Other | Admitting: Nurse Practitioner

## 2021-07-30 DIAGNOSIS — Z20822 Contact with and (suspected) exposure to covid-19: Secondary | ICD-10-CM | POA: Diagnosis not present

## 2021-08-09 ENCOUNTER — Other Ambulatory Visit: Payer: Self-pay

## 2021-08-10 ENCOUNTER — Other Ambulatory Visit: Payer: Self-pay

## 2021-08-10 MED FILL — Tamsulosin HCl Cap 0.4 MG: ORAL | 90 days supply | Qty: 90 | Fill #1 | Status: AC

## 2021-08-10 MED FILL — Lisinopril & Hydrochlorothiazide Tab 20-12.5 MG: ORAL | 90 days supply | Qty: 90 | Fill #1 | Status: AC

## 2021-08-10 MED FILL — Metformin HCl Tab 1000 MG: ORAL | 90 days supply | Qty: 180 | Fill #1 | Status: AC

## 2021-08-11 ENCOUNTER — Other Ambulatory Visit: Payer: Self-pay

## 2021-08-22 DIAGNOSIS — H409 Unspecified glaucoma: Secondary | ICD-10-CM | POA: Diagnosis not present

## 2021-08-22 DIAGNOSIS — J410 Simple chronic bronchitis: Secondary | ICD-10-CM | POA: Diagnosis not present

## 2021-08-22 DIAGNOSIS — Z1211 Encounter for screening for malignant neoplasm of colon: Secondary | ICD-10-CM | POA: Diagnosis not present

## 2021-08-22 DIAGNOSIS — Z1159 Encounter for screening for other viral diseases: Secondary | ICD-10-CM | POA: Diagnosis not present

## 2021-08-22 DIAGNOSIS — E1122 Type 2 diabetes mellitus with diabetic chronic kidney disease: Secondary | ICD-10-CM | POA: Diagnosis not present

## 2021-08-22 DIAGNOSIS — Z114 Encounter for screening for human immunodeficiency virus [HIV]: Secondary | ICD-10-CM | POA: Diagnosis not present

## 2021-08-22 DIAGNOSIS — I10 Essential (primary) hypertension: Secondary | ICD-10-CM | POA: Diagnosis not present

## 2021-08-22 DIAGNOSIS — E1136 Type 2 diabetes mellitus with diabetic cataract: Secondary | ICD-10-CM | POA: Diagnosis not present

## 2021-08-22 DIAGNOSIS — K30 Functional dyspepsia: Secondary | ICD-10-CM | POA: Diagnosis not present

## 2021-08-22 DIAGNOSIS — Z85038 Personal history of other malignant neoplasm of large intestine: Secondary | ICD-10-CM | POA: Diagnosis not present

## 2021-08-22 DIAGNOSIS — N138 Other obstructive and reflux uropathy: Secondary | ICD-10-CM | POA: Diagnosis not present

## 2021-08-22 DIAGNOSIS — Z7251 High risk heterosexual behavior: Secondary | ICD-10-CM | POA: Diagnosis not present

## 2021-11-09 DIAGNOSIS — Z113 Encounter for screening for infections with a predominantly sexual mode of transmission: Secondary | ICD-10-CM | POA: Diagnosis not present

## 2021-11-09 DIAGNOSIS — I2510 Atherosclerotic heart disease of native coronary artery without angina pectoris: Secondary | ICD-10-CM | POA: Diagnosis not present

## 2021-11-09 DIAGNOSIS — Z23 Encounter for immunization: Secondary | ICD-10-CM | POA: Diagnosis not present

## 2021-11-09 DIAGNOSIS — I10 Essential (primary) hypertension: Secondary | ICD-10-CM | POA: Diagnosis not present

## 2021-11-09 DIAGNOSIS — E1122 Type 2 diabetes mellitus with diabetic chronic kidney disease: Secondary | ICD-10-CM | POA: Diagnosis not present

## 2022-04-09 ENCOUNTER — Other Ambulatory Visit: Payer: Self-pay | Admitting: Nurse Practitioner

## 2022-04-28 ENCOUNTER — Other Ambulatory Visit: Payer: Self-pay | Admitting: Nurse Practitioner

## 2022-05-04 ENCOUNTER — Other Ambulatory Visit: Payer: Self-pay | Admitting: Nurse Practitioner

## 2022-05-05 ENCOUNTER — Other Ambulatory Visit: Payer: Self-pay | Admitting: Nurse Practitioner

## 2022-05-07 DIAGNOSIS — N529 Male erectile dysfunction, unspecified: Secondary | ICD-10-CM | POA: Diagnosis not present

## 2022-05-07 DIAGNOSIS — I10 Essential (primary) hypertension: Secondary | ICD-10-CM | POA: Diagnosis not present

## 2022-05-07 DIAGNOSIS — E785 Hyperlipidemia, unspecified: Secondary | ICD-10-CM | POA: Diagnosis not present

## 2022-05-07 DIAGNOSIS — E1122 Type 2 diabetes mellitus with diabetic chronic kidney disease: Secondary | ICD-10-CM | POA: Diagnosis not present

## 2022-05-07 DIAGNOSIS — Z85038 Personal history of other malignant neoplasm of large intestine: Secondary | ICD-10-CM | POA: Diagnosis not present

## 2022-05-07 DIAGNOSIS — N138 Other obstructive and reflux uropathy: Secondary | ICD-10-CM | POA: Diagnosis not present

## 2022-05-07 DIAGNOSIS — H539 Unspecified visual disturbance: Secondary | ICD-10-CM | POA: Diagnosis not present

## 2022-05-07 DIAGNOSIS — F17210 Nicotine dependence, cigarettes, uncomplicated: Secondary | ICD-10-CM | POA: Diagnosis not present

## 2022-05-30 ENCOUNTER — Other Ambulatory Visit: Payer: Self-pay

## 2022-05-30 ENCOUNTER — Ambulatory Visit (INDEPENDENT_AMBULATORY_CARE_PROVIDER_SITE_OTHER): Payer: Medicaid Other | Admitting: Nurse Practitioner

## 2022-05-30 ENCOUNTER — Encounter: Payer: Self-pay | Admitting: Nurse Practitioner

## 2022-05-30 VITALS — BP 168/98 | HR 75 | Temp 97.3°F | Ht 72.0 in | Wt 225.8 lb

## 2022-05-30 DIAGNOSIS — I159 Secondary hypertension, unspecified: Secondary | ICD-10-CM | POA: Diagnosis not present

## 2022-05-30 DIAGNOSIS — K219 Gastro-esophageal reflux disease without esophagitis: Secondary | ICD-10-CM

## 2022-05-30 DIAGNOSIS — E1169 Type 2 diabetes mellitus with other specified complication: Secondary | ICD-10-CM

## 2022-05-30 DIAGNOSIS — E785 Hyperlipidemia, unspecified: Secondary | ICD-10-CM | POA: Diagnosis not present

## 2022-05-30 LAB — POCT URINALYSIS DIP (CLINITEK)
Bilirubin, UA: NEGATIVE
Blood, UA: NEGATIVE
Glucose, UA: 500 mg/dL — AB
Ketones, POC UA: NEGATIVE mg/dL
Leukocytes, UA: NEGATIVE
Nitrite, UA: NEGATIVE
POC PROTEIN,UA: NEGATIVE
Spec Grav, UA: 1.01 (ref 1.010–1.025)
Urobilinogen, UA: 0.2 E.U./dL
pH, UA: 5.5 (ref 5.0–8.0)

## 2022-05-30 MED ORDER — ATORVASTATIN CALCIUM 40 MG PO TABS
40.0000 mg | ORAL_TABLET | Freq: Every evening | ORAL | 3 refills | Status: DC
  Filled 2022-05-30: qty 90, 90d supply, fill #0

## 2022-05-30 MED ORDER — CLONIDINE HCL 0.1 MG PO TABS
0.1000 mg | ORAL_TABLET | Freq: Once | ORAL | Status: AC
  Administered 2022-05-30: 0.1 mg via ORAL

## 2022-05-30 MED ORDER — OMEPRAZOLE 40 MG PO CPDR
40.0000 mg | DELAYED_RELEASE_CAPSULE | Freq: Every day | ORAL | 3 refills | Status: DC
  Filled 2022-05-30: qty 90, 90d supply, fill #0

## 2022-05-30 MED ORDER — SILDENAFIL CITRATE 100 MG PO TABS
50.0000 mg | ORAL_TABLET | Freq: Every day | ORAL | 11 refills | Status: DC | PRN
  Filled 2022-05-30: qty 10, 20d supply, fill #0

## 2022-05-30 MED ORDER — BLOOD GLUCOSE MONITOR KIT: PACK | 0 refills | Status: AC

## 2022-05-30 MED ORDER — METFORMIN HCL 1000 MG PO TABS
1000.0000 mg | ORAL_TABLET | Freq: Two times a day (BID) | ORAL | 3 refills | Status: DC
  Filled 2022-05-30: qty 180, 90d supply, fill #0

## 2022-05-30 MED ORDER — LISINOPRIL-HYDROCHLOROTHIAZIDE 20-12.5 MG PO TABS
1.0000 | ORAL_TABLET | Freq: Every day | ORAL | 3 refills | Status: DC
  Filled 2022-05-30: qty 90, 90d supply, fill #0

## 2022-05-30 MED ORDER — LISINOPRIL-HYDROCHLOROTHIAZIDE 20-25 MG PO TABS
1.0000 | ORAL_TABLET | Freq: Every day | ORAL | 3 refills | Status: DC
  Filled 2022-05-30: qty 30, 30d supply, fill #0

## 2022-05-30 MED ORDER — TRUE METRIX BLOOD GLUCOSE TEST VI STRP
ORAL_STRIP | 12 refills | Status: DC
  Filled 2022-05-30: qty 100, 25d supply, fill #0

## 2022-05-30 NOTE — Progress Notes (Signed)
@Patient  ID: Kyle Merritt, male    DOB: May 02, 1961, 61 y.o.   MRN: [id number]  Chief Complaint  Patient presents with   Follow-up    Pt is here for DM follow up visit. Pt is requesting glucometer also need refill on all medications     Referring provider: Vevelyn Francois, NP    HPI   Kyle Merritt presents for follow up. He  has a past medical history of Arthritis, Colon cancer ([place]), Diabetes mellitus without complication ([place]), High cholesterol, History of colon cancer, History of colon cancer (2004), and Hypertension.    Diabetes Mellitus Patient presents for follow up of diabetes. Current symptoms include: hyperglycemia and weight gain. Patient denies foot ulcerations, hypoglycemia , increased appetite, nausea, paresthesia of the feet, polydipsia and polyuria. Evaluation to date has included: fasting blood sugar, fasting lipid panel, hemoglobin A1C and microalbuminuria.  Home sugars: not checking. Current treatment: more intensive attention to diet which has not been effective, Continued metformin which has been effective, Continued statin which has been effective and Continued ACE inhibitor/ARB which has been effective. Last dilated eye exam: 2022. Patient admits that he is not eating healthy.    Allergies  Allergen Reactions   Crab [Shellfish Allergy]     itching    Immunization History  Administered Date(s) Administered   Influenza Whole 12/08/2008, 02/03/2010   Influenza,inj,Quad PF,6+ Mos 10/17/2018, 08/26/2019, 12/12/2020   Pneumococcal Polysaccharide-23 03/24/2009, 01/29/2018   Td 12/08/2008   Tdap 01/29/2018    Past Medical History:  Diagnosis Date   Arthritis    Colon cancer ([place])    Diabetes mellitus without complication ([place])    High cholesterol    History of colon cancer    History of colon cancer 2004   Hypertension     Tobacco History: Social History   Tobacco Use  Smoking Status Former   Types: Cigarettes   Quit date: 2018   Years since  quitting: 5.4  Smokeless Tobacco Never   Counseling given: Not Answered   Outpatient Encounter Medications as of 05/30/2022  Medication Sig   lisinopril-hydrochlorothiazide (ZESTORETIC) 20-25 MG tablet Take 1 tablet by mouth daily.   Loteprednol Etabonate (EYSUVIS) 0.25 % SUSP Instill 1 drop into right eye twice a day   timolol (TIMOPTIC) 0.5 % ophthalmic solution Instill 1 drop into right eye every morning   [DISCONTINUED] blood glucose meter kit and supplies KIT Dispense based on patient and insurance preference. Use up to four times daily as directed. (FOR ICD-10  E11.9).   [DISCONTINUED] glucose blood (TRUE METRIX BLOOD GLUCOSE TEST) test strip Use as instructed   [DISCONTINUED] sildenafil (VIAGRA) 100 MG tablet Take 0.5 tablets (50 mg total) by mouth daily as needed for erectile dysfunction.   atorvastatin (LIPITOR) 40 MG tablet Take 1 tablet (40 mg total) by mouth every evening.   blood glucose meter kit and supplies KIT Dispense based on patient and insurance preference. Use up to four times daily as directed. (FOR ICD-10  E11.9).   glucose blood (TRUE METRIX BLOOD GLUCOSE TEST) test strip Use as instructed   loteprednol (LOTEMAX) 0.5 % ophthalmic suspension Instill 1 drop into right eye twice a day   metFORMIN (GLUCOPHAGE) 1000 MG tablet TAKE 1 TABLET (1,000 MG TOTAL) BY MOUTH 2 (TWO) TIMES DAILY WITH A MEAL.   metFORMIN (GLUCOPHAGE) 1000 MG tablet Take 1 tablet (1,000 mg total) by mouth 2 (two) times daily with a meal.   omeprazole (PRILOSEC) 40 MG capsule Take 1 capsule (40 mg total)  by mouth daily.   sildenafil (VIAGRA) 100 MG tablet Take 0.5 tablets (50 mg total) by mouth daily as needed for erectile dysfunction.   valACYclovir (VALTREX) 500 MG tablet Take 500 mg by mouth 3 (three) times daily. (Patient not taking: Reported on 05/30/2022)   [DISCONTINUED] atorvastatin (LIPITOR) 40 MG tablet Take 1 tablet (40 mg total) by mouth every evening.   [DISCONTINUED]  lisinopril-hydrochlorothiazide (ZESTORETIC) 20-12.5 MG tablet TAKE 1 TABLET BY MOUTH DAILY.   [DISCONTINUED] lisinopril-hydrochlorothiazide (ZESTORETIC) 20-12.5 MG tablet TAKE 1 TABLET BY MOUTH DAILY.   [DISCONTINUED] metFORMIN (GLUCOPHAGE) 1000 MG tablet Take 1 tablet (1,000 mg total) by mouth 2 (two) times daily with a meal.   [DISCONTINUED] omeprazole (PRILOSEC) 40 MG capsule Take 1 capsule (40 mg total) by mouth daily.   Facility-Administered Encounter Medications as of 05/30/2022  Medication   cloNIDine (CATAPRES) tablet 0.1 mg     Review of Systems  Review of Systems  Constitutional: Negative.   HENT: Negative.    Cardiovascular: Negative.   Gastrointestinal: Negative.   Allergic/Immunologic: Negative.   Neurological: Negative.   Psychiatric/Behavioral: Negative.        Physical Exam  BP (!) 168/98   Pulse 75   Temp (!) 97.3 F (36.3 C)   Ht 6' (1.829 m)   Wt 225 lb 12.8 oz (102.4 kg)   SpO2 100%   BMI 30.62 kg/m   Wt Readings from Last 5 Encounters:  05/30/22 225 lb 12.8 oz (102.4 kg)  05/10/21 226 lb (102.5 kg)  03/13/21 233 lb 12.8 oz (106.1 kg)  03/13/21 233 lb (105.7 kg)  12/12/20 231 lb (104.8 kg)     Physical Exam Vitals and nursing note reviewed.  Constitutional:      General: He is not in acute distress.    Appearance: He is well-developed.  Cardiovascular:     Rate and Rhythm: Normal rate and regular rhythm.  Pulmonary:     Effort: Pulmonary effort is normal.     Breath sounds: Normal breath sounds.  Skin:    General: Skin is warm and dry.  Neurological:     Mental Status: He is alert and oriented to person, place, and time.     Lab Results:  CBC    Component Value Date/Time   WBC 6.7 10/13/2020 1507   WBC 8.0 01/11/2020 2017   RBC 5.45 10/13/2020 1507   RBC 3.65 (L) 01/11/2020 2017   HGB 14.3 10/13/2020 1507   HCT 43.8 10/13/2020 1507   PLT 368 10/13/2020 1507   MCV 80 10/13/2020 1507   MCH 26.2 (L) 10/13/2020 1507   MCH 24.4  (L) 01/11/2020 2017   MCHC 32.6 10/13/2020 1507   MCHC 31.0 01/11/2020 2017   RDW 14.6 10/13/2020 1507   LYMPHSABS 2.9 10/13/2020 1507   MONOABS 0.5 11/26/2019 0952   EOSABS 0.2 10/13/2020 1507   BASOSABS 0.1 10/13/2020 1507    BMET    Component Value Date/Time   NA 139 10/13/2020 1507   K 4.8 10/13/2020 1507   CL 99 10/13/2020 1507   CO2 25 01/11/2020 2017   GLUCOSE 141 (H) 10/13/2020 1507   GLUCOSE 234 (H) 01/11/2020 2017   BUN 26 10/13/2020 1507   CREATININE 1.80 (H) 10/13/2020 1507   CREATININE 1.64 (H) 08/02/2014 1151   CALCIUM 10.2 10/13/2020 1507   GFRNONAA 40 (L) 10/13/2020 1507   GFRAA 46 (L) 10/13/2020 1507    BNP No results found for: BNP  ProBNP No results found for: PROBNP  Imaging: No results found.   Assessment & Plan:   Diabetes ([place]) - blood glucose meter kit and supplies KIT; Dispense based on patient and insurance preference. Use up to four times daily as directed. (FOR ICD-10  E11.9).  Dispense: 1 each; Refill: 0 - glucose blood (TRUE METRIX BLOOD GLUCOSE TEST) test strip; Use as instructed  Dispense: 100 each; Refill: 12 - metFORMIN (GLUCOPHAGE) 1000 MG tablet; Take 1 tablet (1,000 mg total) by mouth 2 (two) times daily with a meal.  Dispense: 180 tablet; Refill: 3 - CBC - Comprehensive metabolic panel - Hemoglobin A1c  2. Gastroesophageal reflux disease without esophagitis  - omeprazole (PRILOSEC) 40 MG capsule; Take 1 capsule (40 mg total) by mouth daily.  Dispense: 90 capsule; Refill: 3  3. Hyperlipidemia, unspecified hyperlipidemia type  - atorvastatin (LIPITOR) 40 MG tablet; Take 1 tablet (40 mg total) by mouth every evening.  Dispense: 90 tablet; Refill: 3  4. Secondary hypertension  - cloNIDine (CATAPRES) tablet 0.1 mg  Will increase dosage on lisinopril   Follow up:  Follow up in 1 week recheck BP     Fenton Foy, NP 05/30/2022

## 2022-05-30 NOTE — Patient Instructions (Addendum)
1. Type 2 diabetes mellitus with other specified complication, without long-term current use of insulin (HCC)  - blood glucose meter kit and supplies KIT; Dispense based on patient and insurance preference. Use up to four times daily as directed. (FOR ICD-10  E11.9).  Dispense: 1 each; Refill: 0 - glucose blood (TRUE METRIX BLOOD GLUCOSE TEST) test strip; Use as instructed  Dispense: 100 each; Refill: 12 - metFORMIN (GLUCOPHAGE) 1000 MG tablet; Take 1 tablet (1,000 mg total) by mouth 2 (two) times daily with a meal.  Dispense: 180 tablet; Refill: 3 - CBC - Comprehensive metabolic panel - Hemoglobin A1c  2. Gastroesophageal reflux disease without esophagitis  - omeprazole (PRILOSEC) 40 MG capsule; Take 1 capsule (40 mg total) by mouth daily.  Dispense: 90 capsule; Refill: 3  3. Hyperlipidemia, unspecified hyperlipidemia type  - atorvastatin (LIPITOR) 40 MG tablet; Take 1 tablet (40 mg total) by mouth every evening.  Dispense: 90 tablet; Refill: 3  4. Secondary hypertension  - cloNIDine (CATAPRES) tablet 0.1 mg  Will increase dosage on lisinopril   Follow up:  Follow up in 1 week recheck BP

## 2022-05-30 NOTE — Assessment & Plan Note (Signed)
-  blood glucose meter kit and supplies KIT; Dispense based on patient and insurance preference. Use up to four times daily as directed. (FOR ICD-10  E11.9).  Dispense: 1 each; Refill: 0 - glucose blood (TRUE METRIX BLOOD GLUCOSE TEST) test strip; Use as instructed  Dispense: 100 each; Refill: 12 - metFORMIN (GLUCOPHAGE) 1000 MG tablet; Take 1 tablet (1,000 mg total) by mouth 2 (two) times daily with a meal.  Dispense: 180 tablet; Refill: 3 - CBC - Comprehensive metabolic panel - Hemoglobin A1c  2. Gastroesophageal reflux disease without esophagitis  - omeprazole (PRILOSEC) 40 MG capsule; Take 1 capsule (40 mg total) by mouth daily.  Dispense: 90 capsule; Refill: 3  3. Hyperlipidemia, unspecified hyperlipidemia type  - atorvastatin (LIPITOR) 40 MG tablet; Take 1 tablet (40 mg total) by mouth every evening.  Dispense: 90 tablet; Refill: 3  4. Secondary hypertension  - cloNIDine (CATAPRES) tablet 0.1 mg  Will increase dosage on lisinopril   Follow up:  Follow up in 1 week recheck BP

## 2022-05-31 ENCOUNTER — Other Ambulatory Visit: Payer: Self-pay | Admitting: Nurse Practitioner

## 2022-05-31 ENCOUNTER — Other Ambulatory Visit: Payer: Self-pay

## 2022-05-31 DIAGNOSIS — E1169 Type 2 diabetes mellitus with other specified complication: Secondary | ICD-10-CM

## 2022-05-31 LAB — COMPREHENSIVE METABOLIC PANEL
ALT: 15 IU/L (ref 0–44)
AST: 16 IU/L (ref 0–40)
Albumin/Globulin Ratio: 1.6 (ref 1.2–2.2)
Albumin: 4.5 g/dL (ref 3.8–4.8)
Alkaline Phosphatase: 107 IU/L (ref 44–121)
BUN/Creatinine Ratio: 19 (ref 10–24)
BUN: 30 mg/dL — ABNORMAL HIGH (ref 8–27)
Bilirubin Total: 0.3 mg/dL (ref 0.0–1.2)
CO2: 23 mmol/L (ref 20–29)
Calcium: 9.8 mg/dL (ref 8.6–10.2)
Chloride: 101 mmol/L (ref 96–106)
Creatinine, Ser: 1.61 mg/dL — ABNORMAL HIGH (ref 0.76–1.27)
Globulin, Total: 2.9 g/dL (ref 1.5–4.5)
Glucose: 145 mg/dL — ABNORMAL HIGH (ref 70–99)
Potassium: 4.2 mmol/L (ref 3.5–5.2)
Sodium: 140 mmol/L (ref 134–144)
Total Protein: 7.4 g/dL (ref 6.0–8.5)
eGFR: 48 mL/min/{1.73_m2} — ABNORMAL LOW (ref >59)

## 2022-05-31 LAB — CBC
Hematocrit: 41.5 % (ref 37.5–51.0)
Hemoglobin: 13.9 g/dL (ref 13.0–17.7)
MCH: 27 pg (ref 26.6–33.0)
MCHC: 33.5 g/dL (ref 31.5–35.7)
MCV: 81 fL (ref 79–97)
Platelets: 327 10*3/uL (ref 150–450)
RBC: 5.15 x10E6/uL (ref 4.14–5.80)
RDW: 14.5 % (ref 11.6–15.4)
WBC: 5.4 10*3/uL (ref 3.4–10.8)

## 2022-05-31 LAB — HEMOGLOBIN A1C
Est. average glucose Bld gHb Est-mCnc: 163 mg/dL
Hgb A1c MFr Bld: 7.3 % — ABNORMAL HIGH (ref 4.8–5.6)

## 2022-05-31 MED ORDER — ACCU-CHEK GUIDE VI STRP
ORAL_STRIP | 12 refills | Status: AC
  Filled 2022-05-31: qty 100, fill #0

## 2022-05-31 MED ORDER — ACCU-CHEK SOFTCLIX LANCETS MISC
5 refills | Status: AC
  Filled 2022-05-31: qty 100, fill #0

## 2022-06-06 ENCOUNTER — Ambulatory Visit: Payer: Medicaid Other | Admitting: Nurse Practitioner

## 2022-06-11 DIAGNOSIS — H35039 Hypertensive retinopathy, unspecified eye: Secondary | ICD-10-CM | POA: Diagnosis not present

## 2022-06-15 ENCOUNTER — Ambulatory Visit: Payer: Medicaid Other

## 2022-09-10 ENCOUNTER — Encounter: Payer: Self-pay | Admitting: Nurse Practitioner

## 2022-09-10 ENCOUNTER — Ambulatory Visit (INDEPENDENT_AMBULATORY_CARE_PROVIDER_SITE_OTHER): Payer: Medicaid Other | Admitting: Nurse Practitioner

## 2022-09-10 ENCOUNTER — Ambulatory Visit (HOSPITAL_COMMUNITY)
Admission: RE | Admit: 2022-09-10 | Discharge: 2022-09-10 | Disposition: A | Discharge status: Home / Self Care | Payer: Medicaid Other | Source: Ambulatory Visit | Attending: Nurse Practitioner | Admitting: Nurse Practitioner

## 2022-09-10 VITALS — BP 128/93 | HR 81 | Temp 97.0°F | Ht 72.0 in | Wt 231.0 lb

## 2022-09-10 DIAGNOSIS — E1169 Type 2 diabetes mellitus with other specified complication: Secondary | ICD-10-CM

## 2022-09-10 DIAGNOSIS — I10 Essential (primary) hypertension: Secondary | ICD-10-CM | POA: Diagnosis not present

## 2022-09-10 DIAGNOSIS — S6292XA Unspecified fracture of left wrist and hand, initial encounter for closed fracture: Secondary | ICD-10-CM | POA: Diagnosis not present

## 2022-09-10 DIAGNOSIS — S6992XA Unspecified injury of left wrist, hand and finger(s), initial encounter: Secondary | ICD-10-CM | POA: Insufficient documentation

## 2022-09-10 DIAGNOSIS — Z1211 Encounter for screening for malignant neoplasm of colon: Secondary | ICD-10-CM

## 2022-09-10 DIAGNOSIS — E785 Hyperlipidemia, unspecified: Secondary | ICD-10-CM | POA: Diagnosis not present

## 2022-09-10 DIAGNOSIS — R202 Paresthesia of skin: Secondary | ICD-10-CM | POA: Diagnosis not present

## 2022-09-10 DIAGNOSIS — R200 Anesthesia of skin: Secondary | ICD-10-CM | POA: Diagnosis not present

## 2022-09-10 MED ORDER — ATORVASTATIN CALCIUM 40 MG PO TABS: 40.0000 mg | ORAL_TABLET | Freq: Every evening | ORAL | 3 refills | Status: DC

## 2022-09-10 MED ORDER — METFORMIN HCL 1000 MG PO TABS: 1000.0000 mg | ORAL_TABLET | Freq: Two times a day (BID) | ORAL | 3 refills | Status: DC

## 2022-09-10 MED ORDER — GLIPIZIDE 5 MG PO TABS: 5.0000 mg | ORAL_TABLET | Freq: Two times a day (BID) | ORAL | 3 refills | Status: DC

## 2022-09-10 MED ORDER — LISINOPRIL-HYDROCHLOROTHIAZIDE 20-25 MG PO TABS: 1.0000 | ORAL_TABLET | Freq: Every day | ORAL | 3 refills | Status: DC

## 2022-09-10 NOTE — Patient Instructions (Signed)
1. Type 2 diabetes mellitus with other specified complication, without long-term current use of insulin (HCC)  - POCT glycosylated hemoglobin (Hb A1C) - Ambulatory referral to Podiatry - metFORMIN (GLUCOPHAGE) 1000 MG tablet; Take 1 tablet (1,000 mg total) by mouth 2 (two) times daily with a meal.  Dispense: 180 tablet; Refill: 3  2. Colon cancer screening  - Ambulatory referral to Gastroenterology  3. Numbness and tingling in left arm  - Ambulatory referral to Neurology  4. Hyperlipidemia, unspecified hyperlipidemia type  - atorvastatin (LIPITOR) 40 MG tablet; Take 1 tablet (40 mg total) by mouth every evening.  Dispense: 90 tablet; Refill: 3  5. Hypertension, unspecified type  - lisinopril-hydrochlorothiazide (ZESTORETIC) 20-25 MG tablet; Take 1 tablet by mouth daily.  Dispense: 90 tablet; Refill: 3  6. Injury of finger of left hand, initial encounter  - DG Hand Complete Left; Future  Follow up:  Follow up in 3 months

## 2022-09-10 NOTE — Assessment & Plan Note (Signed)
-   POCT glycosylated hemoglobin (Hb A1C) - Ambulatory referral to Podiatry - metFORMIN (GLUCOPHAGE) 1000 MG tablet; Take 1 tablet (1,000 mg total) by mouth 2 (two) times daily with a meal.  Dispense: 180 tablet; Refill: 3  2. Colon cancer screening  - Ambulatory referral to Gastroenterology  3. Numbness and tingling in left arm  - Ambulatory referral to Neurology  4. Hyperlipidemia, unspecified hyperlipidemia type  - atorvastatin (LIPITOR) 40 MG tablet; Take 1 tablet (40 mg total) by mouth every evening.  Dispense: 90 tablet; Refill: 3  5. Hypertension, unspecified type  - lisinopril-hydrochlorothiazide (ZESTORETIC) 20-25 MG tablet; Take 1 tablet by mouth daily.  Dispense: 90 tablet; Refill: 3  6. Injury of finger of left hand, initial encounter  - DG Hand Complete Left; Future  Follow up:  Follow up in 3 months

## 2022-09-10 NOTE — Progress Notes (Unsigned)
@Patient  ID: Kyle Merritt, male    DOB: Jan 06, 1961, 61 y.o.   MRN: [id number]  Chief Complaint  Patient presents with   Hypertension    Pt is here for 3 month's HTN follow up visit. Pt is requesting a colonoscopy pt thinks his LT pink is broken no pain. Pt had this issue for 2 months     Referring provider: Fenton Foy, NP  HPI  Kyle Merritt presents for follow up. He  has a past medical history of Arthritis, Colon cancer ([place]), Diabetes mellitus without complication ([place]), High cholesterol, History of colon cancer, History of colon cancer (2004), and Hypertension.      Diabetes Mellitus Patient presents for follow up of diabetes. Current symptoms include: hyperglycemia and weight gain. Patient denies foot ulcerations, hypoglycemia , increased appetite, nausea, paresthesia of the feet, polydipsia and polyuria. Evaluation to date has included: fasting blood sugar, fasting lipid panel, hemoglobin A1C and microalbuminuria.  Home sugars: not checking. Current treatment: more intensive attention to diet which has not been effective, Continued metformin which has been effective, Continued statin which has been effective and Continued ACE inhibitor/ARB which has been effective. Last dilated eye exam: 2022. Patient admits that he is not eating healthy.   Patient has history of colon cancer. States that it is time for another colonoscopy. Will place referral to GI.  Left arm numbness and tingling - especially at night. Left pinky finger hurts - thinks he might have broken the finger walking his dog. The leash got wrapped around his finger.   Also requesting referral to podiatry for diabetic foot care.    Allergies  Allergen Reactions   Crab [Shellfish Allergy]     itching    Immunization History  Administered Date(s) Administered   Influenza Whole 12/08/2008, 02/03/2010   Influenza,inj,Quad PF,6+ Mos 10/17/2018, 08/26/2019, 12/12/2020   Pneumococcal Polysaccharide-23 03/24/2009,  01/29/2018   Td 12/08/2008   Tdap 01/29/2018    Past Medical History:  Diagnosis Date   Arthritis    Colon cancer ([place])    Diabetes mellitus without complication ([place])    High cholesterol    History of colon cancer    History of colon cancer 2004   Hypertension     Tobacco History: Social History   Tobacco Use  Smoking Status Former   Types: Cigarettes   Quit date: 2018   Years since quitting: 5.7  Smokeless Tobacco Never   Counseling given: Not Answered   Outpatient Encounter Medications as of 09/10/2022  Medication Sig   Accu-Chek Softclix Lancets lancets Use as directed twice daily.   blood glucose meter kit and supplies KIT Dispense based on patient and insurance preference. Use up to four times daily as directed. (FOR ICD-10  E11.9).   glipiZIDE (GLUCOTROL) 5 MG tablet Take 1 tablet (5 mg total) by mouth 2 (two) times daily before a meal.   glucose blood (ACCU-CHEK GUIDE) test strip Use as instructed twice daily.   loteprednol (LOTEMAX) 0.5 % ophthalmic suspension Instill 1 drop into right eye twice a day   Loteprednol Etabonate (EYSUVIS) 0.25 % SUSP Instill 1 drop into right eye twice a day   sildenafil (VIAGRA) 100 MG tablet Take 0.5 tablets (50 mg total) by mouth daily as needed for erectile dysfunction.   [DISCONTINUED] atorvastatin (LIPITOR) 40 MG tablet Take 1 tablet (40 mg total) by mouth every evening.   [DISCONTINUED] lisinopril-hydrochlorothiazide (ZESTORETIC) 20-25 MG tablet Take 1 tablet by mouth daily.   [DISCONTINUED] metFORMIN (GLUCOPHAGE) 1000 MG tablet  Take 1 tablet (1,000 mg total) by mouth 2 (two) times daily with a meal.   atorvastatin (LIPITOR) 40 MG tablet Take 1 tablet (40 mg total) by mouth every evening.   lisinopril-hydrochlorothiazide (ZESTORETIC) 20-25 MG tablet Take 1 tablet by mouth daily.   metFORMIN (GLUCOPHAGE) 1000 MG tablet Take 1 tablet (1,000 mg total) by mouth 2 (two) times daily with a meal.   omeprazole (PRILOSEC) 40 MG capsule  Take 1 capsule (40 mg total) by mouth daily. (Patient not taking: Reported on 09/10/2022)   timolol (TIMOPTIC) 0.5 % ophthalmic solution Instill 1 drop into right eye every morning (Patient not taking: Reported on 09/10/2022)   valACYclovir (VALTREX) 500 MG tablet Take 500 mg by mouth 3 (three) times daily. (Patient not taking: Reported on 05/30/2022)   [DISCONTINUED] metFORMIN (GLUCOPHAGE) 1000 MG tablet TAKE 1 TABLET (1,000 MG TOTAL) BY MOUTH 2 (TWO) TIMES DAILY WITH A MEAL.   No facility-administered encounter medications on file as of 09/10/2022.     Review of Systems  Review of Systems  Constitutional: Negative.   HENT: Negative.    Cardiovascular: Negative.   Gastrointestinal: Negative.   Allergic/Immunologic: Negative.   Neurological: Negative.   Psychiatric/Behavioral: Negative.         Physical Exam  BP (!) 164/99 (BP Location: Left Arm, Patient Position: Sitting, Cuff Size: Large)   Pulse 87   Temp (!) 97 F (36.1 C)   Ht 6' (1.829 m)   Wt 231 lb (104.8 kg)   SpO2 100%   BMI 31.33 kg/m   Wt Readings from Last 5 Encounters:  09/10/22 231 lb (104.8 kg)  06/06/22 227 lb 12.8 oz (103.3 kg)  05/30/22 225 lb 12.8 oz (102.4 kg)  05/10/21 226 lb (102.5 kg)  03/13/21 233 lb 12.8 oz (106.1 kg)     Physical Exam Vitals and nursing note reviewed.  Constitutional:      General: He is not in acute distress.    Appearance: He is well-developed.  Cardiovascular:     Rate and Rhythm: Normal rate and regular rhythm.  Pulmonary:     Effort: Pulmonary effort is normal.     Breath sounds: Normal breath sounds.  Skin:    General: Skin is warm and dry.  Neurological:     Mental Status: He is alert and oriented to person, place, and time.      Lab Results:  CBC    Component Value Date/Time   WBC 5.4 05/30/2022 1554   WBC 8.0 01/11/2020 2017   RBC 5.15 05/30/2022 1554   RBC 3.65 (L) 01/11/2020 2017   HGB 13.9 05/30/2022 1554   HCT 41.5 05/30/2022 1554   PLT 327  05/30/2022 1554   MCV 81 05/30/2022 1554   MCH 27.0 05/30/2022 1554   MCH 24.4 (L) 01/11/2020 2017   MCHC 33.5 05/30/2022 1554   MCHC 31.0 01/11/2020 2017   RDW 14.5 05/30/2022 1554   LYMPHSABS 2.9 10/13/2020 1507   MONOABS 0.5 11/26/2019 0952   EOSABS 0.2 10/13/2020 1507   BASOSABS 0.1 10/13/2020 1507    BMET    Component Value Date/Time   NA 140 05/30/2022 1554   K 4.2 05/30/2022 1554   CL 101 05/30/2022 1554   CO2 23 05/30/2022 1554   GLUCOSE 145 (H) 05/30/2022 1554   GLUCOSE 234 (H) 01/11/2020 2017   BUN 30 (H) 05/30/2022 1554   CREATININE 1.61 (H) 05/30/2022 1554   CREATININE 1.64 (H) 08/02/2014 1151   CALCIUM 9.8 05/30/2022 1554  GFRNONAA 40 (L) 10/13/2020 1507   GFRAA 46 (L) 10/13/2020 1507     Assessment & Plan:   Diabetes ([place]) - POCT glycosylated hemoglobin (Hb A1C) - Ambulatory referral to Podiatry - metFORMIN (GLUCOPHAGE) 1000 MG tablet; Take 1 tablet (1,000 mg total) by mouth 2 (two) times daily with a meal.  Dispense: 180 tablet; Refill: 3  2. Colon cancer screening  - Ambulatory referral to Gastroenterology  3. Numbness and tingling in left arm  - Ambulatory referral to Neurology  4. Hyperlipidemia, unspecified hyperlipidemia type  - atorvastatin (LIPITOR) 40 MG tablet; Take 1 tablet (40 mg total) by mouth every evening.  Dispense: 90 tablet; Refill: 3  5. Hypertension, unspecified type  - lisinopril-hydrochlorothiazide (ZESTORETIC) 20-25 MG tablet; Take 1 tablet by mouth daily.  Dispense: 90 tablet; Refill: 3  6. Injury of finger of left hand, initial encounter  - DG Hand Complete Left; Future  Follow up:  Follow up in 3 months     Fenton Foy, NP 09/10/2022

## 2022-09-11 LAB — POCT GLYCOSYLATED HEMOGLOBIN (HGB A1C)
HbA1c POC (<> result, manual entry): 7.7 % (ref 4.0–5.6)
HbA1c, POC (controlled diabetic range): 7.7 % — AB (ref 0.0–7.0)
HbA1c, POC (prediabetic range): 7.7 % — AB (ref 5.7–6.4)
Hemoglobin A1C: 7.7 % — AB (ref 4.0–5.6)

## 2022-09-27 ENCOUNTER — Ambulatory Visit: Payer: Medicaid Other | Attending: Nurse Practitioner | Admitting: Nurse Practitioner

## 2022-09-27 ENCOUNTER — Encounter: Payer: Self-pay | Admitting: Nurse Practitioner

## 2022-09-27 VITALS — BP 140/90 | HR 79 | Wt 234.0 lb

## 2022-09-27 DIAGNOSIS — E782 Mixed hyperlipidemia: Secondary | ICD-10-CM | POA: Diagnosis not present

## 2022-09-27 DIAGNOSIS — I498 Other specified cardiac arrhythmias: Secondary | ICD-10-CM | POA: Diagnosis not present

## 2022-09-27 DIAGNOSIS — E119 Type 2 diabetes mellitus without complications: Secondary | ICD-10-CM

## 2022-09-27 DIAGNOSIS — I493 Ventricular premature depolarization: Secondary | ICD-10-CM

## 2022-09-27 DIAGNOSIS — I10 Essential (primary) hypertension: Secondary | ICD-10-CM

## 2022-09-27 NOTE — Progress Notes (Signed)
Office Visit    Patient Name: Kyle Merritt Date of Encounter: 09/27/2022  Primary Care Provider:  Fenton Foy, NP Primary Cardiologist:  Pixie Casino, MD  Chief Complaint    61 year old male with a history of frequent PVCs (bigeminy), hypertension, hyperlipidemia, type 2 diabetes and colon cancer who presents for follow-up related to palpitations.  Past Medical History    Past Medical History:  Diagnosis Date   Arthritis    Colon cancer ([place])    Diabetes mellitus without complication ([place])    High cholesterol    History of colon cancer    History of colon cancer 2004   Hypertension    Past Surgical History:  Procedure Laterality Date   COLON SURGERY     KNEE ARTHROSCOPY Bilateral    TOTAL KNEE ARTHROPLASTY Right 11/30/2019   Procedure: RIGHT TOTAL KNEE ARTHROPLASTY;  Surgeon: Frederik Pear, MD;  Location: [facility];  Service: Orthopedics;  Laterality: Right;   TRIGGER FINGER RELEASE Right 05/22/2019    Allergies  Allergies  Allergen Reactions   Crab [Shellfish Allergy]     itching    History of Present Illness    61 year old male with the above past medical history including frequent PVCs (bigeminy), hypertension, hyperlipidemia, type 2 diabetes and colon cancer.  He was initially referred to Dr. Debara Pickett for the evaluation of irregular heart rhythm.  EKG at the time showed sinus rhythm with PVCs of bigeminy.  He denied any associated symptoms.  Echocardiogram was recommended but not performed due to difficulty scheduling.  He was last seen in the office on 05/10/2021 and was stable from a cardiac standpoint.  He denied any worsening palpitations, denied symptoms concerning for angina.  BP was well controlled.  His PCP in September 2023 and reported numbness and tingling in his left arm, he was referred to neurology.  He presents today for follow-up. Since his last visit he has been stable from a cardiac standpoint. He denies any recent palpitations. BP has been  slightly elevated.  He was recently started on lisinopril-hydrochlorothiazide per his PCP.  He denies any chest pain, dyspnea. Overall, he reports feeling well and other than his mildly elevated BP, he denies any additional concerns today.   Home Medications    Current Outpatient Medications  Medication Sig Dispense Refill   Accu-Chek Softclix Lancets lancets Use as directed twice daily. 100 each 5   atorvastatin (LIPITOR) 40 MG tablet Take 1 tablet (40 mg total) by mouth every evening. 90 tablet 3   blood glucose meter kit and supplies KIT Dispense based on patient and insurance preference. Use up to four times daily as directed. (FOR ICD-10  E11.9). 1 each 0   glipiZIDE (GLUCOTROL) 5 MG tablet Take 1 tablet (5 mg total) by mouth 2 (two) times daily before a meal. 60 tablet 3   glucose blood (ACCU-CHEK GUIDE) test strip Use as instructed twice daily. 100 each 12   lisinopril-hydrochlorothiazide (ZESTORETIC) 20-25 MG tablet Take 1 tablet by mouth daily. 90 tablet 3   loteprednol (LOTEMAX) 0.5 % ophthalmic suspension Instill 1 drop into right eye twice a day 5 mL 1   Loteprednol Etabonate (EYSUVIS) 0.25 % SUSP Instill 1 drop into right eye twice a day 8.3 mL 1   metFORMIN (GLUCOPHAGE) 1000 MG tablet Take 1 tablet (1,000 mg total) by mouth 2 (two) times daily with a meal. 180 tablet 3   omeprazole (PRILOSEC) 40 MG capsule Take 1 capsule (40 mg total) by mouth daily. [place]  capsule 3   sildenafil (VIAGRA) 100 MG tablet Take 0.5 tablets (50 mg total) by mouth daily as needed for erectile dysfunction. 10 tablet 11   timolol (TIMOPTIC) 0.5 % ophthalmic solution Instill 1 drop into right eye every morning 5 mL 3   valACYclovir (VALTREX) 500 MG tablet Take 500 mg by mouth 3 (three) times daily.     No current facility-administered medications for this visit.     Review of Systems    He denies chest pain, palpitations, dyspnea, pnd, orthopnea, n, v, dizziness, syncope, edema, weight gain, or early  satiety. All other systems reviewed and are otherwise negative except as noted above.   Physical Exam    VS:  BP (!) 140/90   Pulse 79   Wt 234 lb (106.1 kg)   SpO2 98%   BMI 31.74 kg/m  GEN: Well nourished, well developed, in no acute distress. HEENT: normal. Neck: Supple, no JVD, carotid bruits, or masses. Cardiac: RRR, no murmurs, rubs, or gallops. No clubbing, cyanosis, edema.  Radials/DP/PT 2+ and equal bilaterally.  Respiratory:  Respirations regular and unlabored, clear to auscultation bilaterally. GI: Soft, nontender, nondistended, BS + x 4. MS: no deformity or atrophy. Skin: warm and dry, no rash. Neuro:  Strength and sensation are intact. Psych: Normal affect.  Accessory Clinical Findings    ECG personally reviewed by me today -sinus rhythm, 79 bpm, occasional PVCs- no acute changes.   Lab Results  Component Value Date   WBC 5.4 05/30/2022   HGB 13.9 05/30/2022   HCT 41.5 05/30/2022   MCV 81 05/30/2022   PLT 327 05/30/2022   Lab Results  Component Value Date   CREATININE 1.61 (H) 05/30/2022   BUN 30 (H) 05/30/2022   NA 140 05/30/2022   K 4.2 05/30/2022   CL 101 05/30/2022   CO2 23 05/30/2022   Lab Results  Component Value Date   ALT 15 05/30/2022   AST 16 05/30/2022   ALKPHOS 107 05/30/2022   BILITOT 0.3 05/30/2022   Lab Results  Component Value Date   CHOL 199 08/15/2020   HDL 66 08/15/2020   LDLCALC 116 (H) 08/15/2020   TRIG 96 08/15/2020   CHOLHDL 3.0 08/15/2020    Lab Results  Component Value Date   HGBA1C 7.7 (A) 09/10/2022   HGBA1C 7.7 09/10/2022   HGBA1C 7.7 (A) 09/10/2022   HGBA1C 7.7 (A) 09/10/2022    Assessment & Plan    1. 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 He denies any recent palpitations. Stable.  2. Hypertension: BP has been elevated above goal. He was recently started on lisinopril-hydrochlorothiazide per PCP.  Continue to monitor.  If BP remains elevated above goal of  130/80, consider further titration of BP medications.  3. Hyperlipidemia: Recent lipid profile available.  Monitored and managed per PCP.  Continue Lipitor.  4. Type 2 diabetes: A1c was 7.7 in 08/2022.  Monitor managed per PCP.  5. Disposition: Follow-up in 6 months with Dr. Debara Pickett.  Lenna Sciara, NP 09/27/2022, 5:22 PM

## 2022-09-27 NOTE — Patient Instructions (Signed)
Medication Instructions:  Your physician recommends that you continue on your current medications as directed. Please refer to the Current Medication list given to you today.   *If you need a refill on your cardiac medications before your next appointment, please call your pharmacy*   Lab Work: NONE ordered at this time of appointment   If you have labs (blood work) drawn today and your tests are completely normal, you will receive your results only by: [place] (if you have MyChart) OR A paper copy in the mail If you have any lab test that is abnormal or we need to change your treatment, we will call you to review the results.   Testing/Procedures: NONE ordered at this time of appointment     Follow-Up: At [facility], you and your health needs are our priority.  As part of our continuing mission to provide you with exceptional heart care, we have created designated Provider Care Teams.  These Care Teams include your primary Cardiologist (physician) and Advanced Practice Providers (APPs -  Physician Assistants and Nurse Practitioners) who all work together to provide you with the care you need, when you need it.  We recommend signing up for the patient portal called "MyChart".  Sign up information is provided on this After Visit Summary.  MyChart is used to connect with patients for Virtual Visits (Telemedicine).  Patients are able to view lab/test results, encounter notes, upcoming appointments, etc.  Non-urgent messages can be sent to your provider as well.   To learn more about what you can do with MyChart, go to [email or web address].    Your next appointment:   6 month(s)  The format for your next appointment:   In Person  Provider:   None     Other Instructions   Important Information About Sugar

## 2022-09-28 ENCOUNTER — Encounter: Payer: Self-pay | Admitting: Podiatrist

## 2022-09-28 ENCOUNTER — Ambulatory Visit (INDEPENDENT_AMBULATORY_CARE_PROVIDER_SITE_OTHER): Payer: Medicaid Other | Admitting: Podiatrist

## 2022-09-28 DIAGNOSIS — B351 Tinea unguium: Secondary | ICD-10-CM | POA: Diagnosis not present

## 2022-09-28 DIAGNOSIS — M79609 Pain in unspecified limb: Secondary | ICD-10-CM | POA: Diagnosis not present

## 2022-09-28 DIAGNOSIS — E1169 Type 2 diabetes mellitus with other specified complication: Secondary | ICD-10-CM

## 2022-09-28 NOTE — Progress Notes (Signed)
Chief Complaint  Patient presents with   Nail Problem    Routine foot care     Subjective: Kyle Merritt presents today for follow up of preventative diabetic foot care and painful mycotic nails b/l that are difficult to trim. Pain interferes with ambulation. Aggravating factors include wearing enclosed shoe gear. Pain is relieved with periodic professional debridement.    Relates no new pedal complaints at todays visit.         Allergies  Allergen Reactions   Crab [Shellfish Allergy]        itching      Objective: There were no vitals filed for this visit.   Vascular Examination:  Capillary fill time to digits <3s b/l, palpable DP pulses b/l, palpable PT pulses b/l, pedal hair sparse b/l and skin temperature gradient within normal limits b/l   Dermatological Examination: Pedal skin with normal turgor, texture and tone bilaterally, no open wounds bilaterally, no interdigital macerations bilaterally, toenails 1-5 b/l elongated, dystrophic, thickened, crumbly with subungual debris noted.  No erythema, no edema, no drainage, no flocculence.   Incurvated nailplate right great toe b/l border(s) with tenderness to palpation. No erythema, no edema, no drainage noted.   Musculoskeletal: Normal muscle strength 5/5 to all lower extremity muscle groups bilaterally, no pain crepitus or joint limitation noted with ROM b/l and bunion deformity noted b/l   Neurological: Protective sensation intact 5/5 intact bilaterally with 10g monofilament b/l and vibratory sensation intact b/l   Assessment:   ICD-10-CM   1. Pain due to onychomycosis of nail  B35.1    M79.609     2. Type 2 diabetes mellitus with other specified complication, without long-term current use of insulin (HCC)  E11.69          Plan: -Continue diabetic foot care principles.  -Toenails 1-5 b/l were debrided in length and girth without iatrogenic bleeding. Hallux nails trimmed in slant back fashion in corners without  incident.  He will watch closely for any signs of infection.  -Patient to continue soft, supportive shoe gear daily. -Patient to report any pedal injuries to medical professional immediately. -Patient/POA to call should there be question/concern in the interim.

## 2022-10-19 ENCOUNTER — Encounter: Payer: Self-pay | Admitting: Nurse Practitioner

## 2022-10-19 ENCOUNTER — Ambulatory Visit (INDEPENDENT_AMBULATORY_CARE_PROVIDER_SITE_OTHER): Payer: Medicaid Other | Admitting: Nurse Practitioner

## 2022-10-19 VITALS — BP 140/85 | HR 80 | Temp 97.8°F | Resp 20 | Ht 72.0 in | Wt 240.0 lb

## 2022-10-19 DIAGNOSIS — Z23 Encounter for immunization: Secondary | ICD-10-CM | POA: Diagnosis not present

## 2022-10-19 DIAGNOSIS — R202 Paresthesia of skin: Secondary | ICD-10-CM | POA: Diagnosis not present

## 2022-10-19 DIAGNOSIS — E1169 Type 2 diabetes mellitus with other specified complication: Secondary | ICD-10-CM | POA: Diagnosis not present

## 2022-10-19 DIAGNOSIS — R200 Anesthesia of skin: Secondary | ICD-10-CM | POA: Diagnosis not present

## 2022-10-19 LAB — POCT GLYCOSYLATED HEMOGLOBIN (HGB A1C): HbA1c, POC (prediabetic range): 6.3 % (ref 5.7–6.4)

## 2022-10-19 MED ORDER — PREDNISONE 20 MG PO TABS: 20.0000 mg | ORAL_TABLET | Freq: Every day | ORAL | 0 refills | Status: AC

## 2022-10-19 MED ORDER — GLIPIZIDE 5 MG PO TABS: 2.5000 mg | ORAL_TABLET | Freq: Every day | ORAL | 3 refills | Status: DC

## 2022-10-19 NOTE — Assessment & Plan Note (Signed)
-   HgB A1c - glipiZIDE (GLUCOTROL) 5 MG tablet; Take 0.5 tablets (2.5 mg total) by mouth daily before breakfast.  Dispense: 60 tablet; Refill: 3  2. Need for immunization against influenza  - Flu Vaccine QUAD 64moIM (Fluarix, Fluzone & Alfiuria Quad PF)  3. Numbness and tingling in left arm  - predniSONE (DELTASONE) 20 MG tablet; Take 1 tablet (20 mg total) by mouth daily with breakfast for 5 days.  Dispense: 5 tablet; Refill: 0    Follow up:  Follow up in 3 months

## 2022-10-19 NOTE — Progress Notes (Signed)
_0  ID: Kyle Merritt, male    DOB: Nov 04, 1961, 61 y.o.   MRN: [id number]  Chief Complaint  Patient presents with   Diabetes    Referring provider: Fenton Foy, NP   HPI  Kyle Merritt presents for follow up. He  has a past medical history of Arthritis, Colon cancer ([place]), Diabetes mellitus without complication ([place]), High cholesterol, History of colon cancer, History of colon cancer (2004), and Hypertension.      Diabetes Mellitus Patient presents for follow up of diabetes. Current symptoms include: hyperglycemia and weight gain. Patient denies foot ulcerations, hypoglycemia , increased appetite, nausea, paresthesia of the feet, polydipsia and polyuria. Evaluation to date has included: fasting blood sugar, fasting lipid panel, hemoglobin A1C and microalbuminuria.  Home sugars: not checking. Current treatment: more intensive attention to diet which has not been effective, Continued metformin which has been effective, Continued statin which has been effective and Continued ACE inhibitor/ARB which has been effective. Last dilated eye exam: 2022. Patient states that his blood sugars have been dropping too low. We will decrease glipizide to 2.5 mg daily.    Patient continues to have left arm numbness and tingling - especially at night.  We will trial a round of prednisone.  Patient does have an upcoming appointment scheduled with neurology to address this issue.  Denies f/c/s, n/v/d, hemoptysis, PND, leg swelling Denies chest pain or edema       Allergies  Allergen Reactions   Crab [Shellfish Allergy]     itching    Immunization History  Administered Date(s) Administered   Influenza Whole 12/08/2008, 02/03/2010   Influenza,inj,Quad PF,6+ Mos 10/17/2018, 08/26/2019, 12/12/2020, 10/19/2022   Pneumococcal Polysaccharide-23 03/24/2009, 01/29/2018   Td 12/08/2008   Tdap 01/29/2018    Past Medical History:  Diagnosis Date   Arthritis    Colon cancer ([place])    Diabetes  mellitus without complication ([place])    High cholesterol    History of colon cancer    History of colon cancer 2004   Hypertension     Tobacco History: Social History   Tobacco Use  Smoking Status Former   Types: Cigarettes   Quit date: 2018   Years since quitting: 5.8  Smokeless Tobacco Never   Counseling given: Not Answered   Outpatient Encounter Medications as of 10/19/2022  Medication Sig   atorvastatin (LIPITOR) 40 MG tablet Take 1 tablet (40 mg total) by mouth every evening.   glipiZIDE (GLUCOTROL) 5 MG tablet Take 0.5 tablets (2.5 mg total) by mouth daily before breakfast.   lisinopril-hydrochlorothiazide (ZESTORETIC) 20-25 MG tablet Take 1 tablet by mouth daily.   Loteprednol Etabonate (EYSUVIS) 0.25 % SUSP Instill 1 drop into right eye twice a day   metFORMIN (GLUCOPHAGE) 1000 MG tablet Take 1 tablet (1,000 mg total) by mouth 2 (two) times daily with a meal.   predniSONE (DELTASONE) 20 MG tablet Take 1 tablet (20 mg total) by mouth daily with breakfast for 5 days.   [DISCONTINUED] glipiZIDE (GLUCOTROL) 5 MG tablet Take 1 tablet (5 mg total) by mouth 2 (two) times daily before a meal.   Accu-Chek Softclix Lancets lancets Use as directed twice daily.   blood glucose meter kit and supplies KIT Dispense based on patient and insurance preference. Use up to four times daily as directed. (FOR ICD-10  E11.9).   glucose blood (ACCU-CHEK GUIDE) test strip Use as instructed twice daily.   loteprednol (LOTEMAX) 0.5 % ophthalmic suspension Instill 1 drop into right eye twice a day  omeprazole (PRILOSEC) 40 MG capsule Take 1 capsule (40 mg total) by mouth daily.   sildenafil (VIAGRA) 100 MG tablet Take 0.5 tablets (50 mg total) by mouth daily as needed for erectile dysfunction.   timolol (TIMOPTIC) 0.5 % ophthalmic solution Instill 1 drop into right eye every morning   valACYclovir (VALTREX) 500 MG tablet Take 500 mg by mouth 3 (three) times daily.   No facility-administered  encounter medications on file as of 10/19/2022.     Review of Systems  Review of Systems  Constitutional: Negative.   HENT: Negative.    Cardiovascular: Negative.   Gastrointestinal: Negative.   Allergic/Immunologic: Negative.   Neurological:  Positive for numbness (left arm).  Psychiatric/Behavioral: Negative.         Physical Exam  BP (!) 140/85 (BP Location: Left Arm, Patient Position: Sitting, Cuff Size: Large)   Pulse 80   Temp 97.8 F (36.6 C) (Oral)   Resp 20   Ht 6' (1.829 m)   Wt 240 lb (108.9 kg)   SpO2 99%   BMI 32.55 kg/m   Wt Readings from Last 5 Encounters:  10/19/22 240 lb (108.9 kg)  09/27/22 234 lb (106.1 kg)  09/10/22 231 lb (104.8 kg)  06/06/22 227 lb 12.8 oz (103.3 kg)  05/30/22 225 lb 12.8 oz (102.4 kg)     Physical Exam   Lab Results:  CBC    Component Value Date/Time   WBC 5.4 05/30/2022 1554   WBC 8.0 01/11/2020 2017   RBC 5.15 05/30/2022 1554   RBC 3.65 (L) 01/11/2020 2017   HGB 13.9 05/30/2022 1554   HCT 41.5 05/30/2022 1554   PLT 327 05/30/2022 1554   MCV 81 05/30/2022 1554   MCH 27.0 05/30/2022 1554   MCH 24.4 (L) 01/11/2020 2017   MCHC 33.5 05/30/2022 1554   MCHC 31.0 01/11/2020 2017   RDW 14.5 05/30/2022 1554   LYMPHSABS 2.9 10/13/2020 1507   MONOABS 0.5 11/26/2019 0952   EOSABS 0.2 10/13/2020 1507   BASOSABS 0.1 10/13/2020 1507    BMET    Component Value Date/Time   NA 140 05/30/2022 1554   K 4.2 05/30/2022 1554   CL 101 05/30/2022 1554   CO2 23 05/30/2022 1554   GLUCOSE 145 (H) 05/30/2022 1554   GLUCOSE 234 (H) 01/11/2020 2017   BUN 30 (H) 05/30/2022 1554   CREATININE 1.61 (H) 05/30/2022 1554   CREATININE 1.64 (H) 08/02/2014 1151   CALCIUM 9.8 05/30/2022 1554   GFRNONAA 40 (L) 10/13/2020 1507   GFRAA 46 (L) 10/13/2020 1507     Assessment & Plan:   Diabetes ([place]) - HgB A1c - glipiZIDE (GLUCOTROL) 5 MG tablet; Take 0.5 tablets (2.5 mg total) by mouth daily before breakfast.  Dispense: 60 tablet;  Refill: 3  2. Need for immunization against influenza  - Flu Vaccine QUAD 52moIM (Fluarix, Fluzone & Alfiuria Quad PF)  3. Numbness and tingling in left arm  - predniSONE (DELTASONE) 20 MG tablet; Take 1 tablet (20 mg total) by mouth daily with breakfast for 5 days.  Dispense: 5 tablet; Refill: 0    Follow up:  Follow up in 3 months     TFenton Foy NP 10/19/2022

## 2022-10-19 NOTE — Progress Notes (Signed)
DM f/u  Numbness in fingers and hands

## 2022-10-19 NOTE — Patient Instructions (Addendum)
1. Type 2 diabetes mellitus with other specified complication, without long-term current use of insulin (HCC)  - HgB A1c - glipiZIDE (GLUCOTROL) 5 MG tablet; Take 0.5 tablets (2.5 mg total) by mouth daily before breakfast.  Dispense: 60 tablet; Refill: 3  2. Need for immunization against influenza  - Flu Vaccine QUAD 90moIM (Fluarix, Fluzone & Alfiuria Quad PF)  3. Numbness and tingling in left arm  - predniSONE (DELTASONE) 20 MG tablet; Take 1 tablet (20 mg total) by mouth daily with breakfast for 5 days.  Dispense: 5 tablet; Refill: 0    Follow up:  Follow up in 3 months

## 2022-10-23 ENCOUNTER — Telehealth: Payer: Self-pay

## 2022-10-23 NOTE — Chronic Care Management (AMB) (Signed)
   Care Guide Note  10/23/2022 Name: Kyle Merritt MRN: [id number] DOB: [birth year]  Referred by: Fenton Foy, NP Reason for referral : Care Coordination (Outreach to schedule with Pharm D )   Kyle Merritt is a 61 y.o. year old male who is a primary care patient of Fenton Foy, NP. Kyle Merritt was referred to the pharmacist for assistance related to DM.    Successful contact was made with the patient to discuss pharmacy services including being ready for the pharmacist to call at least 5 minutes before the scheduled appointment time, to have medication bottles and any blood sugar or blood pressure readings ready for review. The patient agreed to meet with the pharmacist via with the pharmacist via telephone visit on 11/06/2022.    Noreene Larsson, [place], [place] [address] Direct Dial: [phone] [email or web address]

## 2022-11-01 DIAGNOSIS — H5213 Myopia, bilateral: Secondary | ICD-10-CM | POA: Diagnosis not present

## 2022-11-06 ENCOUNTER — Other Ambulatory Visit: Payer: Medicaid Other | Admitting: Pharmacist

## 2022-11-06 DIAGNOSIS — I10 Essential (primary) hypertension: Secondary | ICD-10-CM

## 2022-11-06 MED ORDER — BLOOD PRESSURE MONITOR DEVI: 0 refills | Status: AC

## 2022-11-06 NOTE — Patient Instructions (Signed)
[place],   It was great talking to you today!  Stop glipizide. Restart Jardiance 10 mg daily.   Check your blood sugars twice daily:  1) Fasting, first thing in the morning before breakfast and  2) 2 hours after your largest meal.   For a goal A1c of less than 7%, goal fasting readings are less than 130 and goal 2 hour after meal readings are less than 180.   Check your blood pressure once daily, and any time you have concerning symptoms like headache, chest pain, dizziness, shortness of breath, or vision changes.   Our goal is less than 130/80.  To appropriately check your blood pressure, make sure you do the following:  1) Avoid caffeine, exercise, or tobacco products for 30 minutes before checking. Empty your bladder. 2) Sit with your back supported in a flat-backed chair. Rest your arm on something flat (arm of the chair, table, etc). 3) Sit still with your feet flat on the floor, resting, for at least 5 minutes.  4) Check your blood pressure. Take 1-2 readings.  5) Write down these readings and bring with you to any provider appointments.  Bring your home blood pressure machine with you to a provider's office for accuracy comparison at least once a year.   Make sure you take your blood pressure medications before you come to any office visit, even if you were asked to fast for labs.  Take care!  Catie Hedwig Morton, PharmD, [facility] [phone]

## 2022-11-06 NOTE — Progress Notes (Unsigned)
11/06/2022 Name: Al Bracewell MRN: [id number] DOB: March 14, 1961  Chief Complaint  Patient presents with   Medication Management   Diabetes   Hypertension    Kyle Merritt is a 61 y.o. year old male who presented for a telephone visit.   They were referred to the pharmacist by their PCP for assistance in managing diabetes and hypertension.   Patient is participating in a Managed Medicaid Plan:  Yes  Subjective:  Care Team: Primary Care Provider: Fenton Foy, NP ; Next Scheduled Visit: 12/10/22  Medication Access/Adherence  Current Pharmacy:  [facility] [address], [place] [address] Phone: [phone] Fax: [phone]  [facility] ([place] - [address] [place] [address] Phone: [phone] Fax: [place] [address], [place] [address] [place] [address] Phone: [phone] Fax: [place], [place] [address] [place] [address] Phone: [phone] Fax: [phone]   Patient reports affordability concerns with their medications: No  Patient reports access/transportation concerns to their pharmacy: No  Patient reports adherence concerns with their medications:  No     Diabetes:  Current medications: metformin 1000 mg twice daily, glipizide XL 2.5 mg daily - reports he recently decreased from 5 mg Previous medications: previously on Jardiance, unclear why it was discontinued. Patient reports he tolerated this well   Current glucose readings: fasting: 90-110; 2 hour post prandial: 110-120  Patient reports periodic hypoglycemic s/sx including dizziness, shakiness, sweating. [name]  Hypertension:  Current medications: lisinopril/HCTZ 20/25 mg daily   Patient does not have  a validated, automated, upper arm home BP cuff   Hyperlipidemia/ASCVD Risk Reduction  Current lipid lowering medications: atorvastatin 40 mg daily    Health Maintenance  Health Maintenance Due  Topic Date Due   Zoster Vaccines- Shingrix (1 of 2) Never done   Diabetic kidney evaluation - Urine ACR  04/30/2015   OPHTHALMOLOGY EXAM  01/23/2022   FOOT EXAM  03/13/2022     Objective: Lab Results  Component Value Date   HGBA1C 6.3 10/19/2022    Lab Results  Component Value Date   CREATININE 1.61 (H) 05/30/2022   BUN 30 (H) 05/30/2022   NA 140 05/30/2022   K 4.2 05/30/2022   CL 101 05/30/2022   CO2 23 05/30/2022    Lab Results  Component Value Date   CHOL 199 08/15/2020   HDL 66 08/15/2020   LDLCALC 116 (H) 08/15/2020   TRIG 96 08/15/2020   CHOLHDL 3.0 08/15/2020    Medications Reviewed Today     Reviewed by Osker Mason, RPH-CPP (Pharmacist) on 11/06/22 at 1000  Med List Status: <None>   Medication Order Taking? Sig Documenting Provider Last Dose Status Informant  Accu-Chek Softclix Lancets lancets [id number] Yes Use as directed twice daily. Fenton Foy, NP Taking Active   atorvastatin (LIPITOR) 40 MG tablet [id number] Yes Take 1 tablet (40 mg total) by mouth every evening. Fenton Foy, NP Taking Active   blood glucose meter kit and supplies KIT [id number] Yes Dispense based on patient and insurance preference. Use up to four times daily as directed. (FOR ICD-10  E11.9). Fenton Foy, NP Taking Active   dorzolamide-timolol (COSOPT) 2-0.5 % ophthalmic solution [id number] Yes Place 1 drop into the right eye 2 (two) times daily.  [provider] Taking Active   glipiZIDE (GLUCOTROL) 5 MG tablet [id number] Yes Take 0.5 tablets (2.5 mg total) by mouth daily before breakfast. Fenton Foy, NP Taking Active   glucose blood (ACCU-CHEK GUIDE) test strip [id number] Yes Use as instructed twice daily. Fenton Foy, NP Taking Active    lisinopril-hydrochlorothiazide (ZESTORETIC) 20-25 MG tablet [id number] Yes Take 1 tablet by mouth daily. Fenton Foy, NP Taking Active   metFORMIN (GLUCOPHAGE) 1000 MG tablet [id number] Yes Take 1 tablet (1,000 mg total) by mouth 2 (two) times daily with a meal. Fenton Foy, NP Taking Active   omeprazole (PRILOSEC) 40 MG capsule [id number] Yes Take 1 capsule (40 mg total) by mouth daily. Fenton Foy, NP Taking Active   sildenafil (VIAGRA) 100 MG tablet [id number] Yes Take 0.5 tablets (50 mg total) by mouth daily as needed for erectile dysfunction. Fenton Foy, NP Taking Active   VYZULTA 0.024 % SOLN [id number] Yes Apply 1 drop to eye every evening. [provider] Taking Active   Med List Note Jerline Pain, [place], [place] 02/08/20 1542): +3              Assessment/Plan:   Diabetes: - Currently controlled but with opportunity for optimization - Reviewed long term cardiovascular and renal outcomes of uncontrolled blood sugar - Reviewed goal A1c, goal fasting, and goal 2 hour post prandial glucose - Recommend to discontinue glipizide due to hypoglycemic symptoms, but re-initiate Jardiance for CKD benefit. Discussed with PCP, she is in agreement. Discussed with patient. [place] to notify of glipizide discontinuation - Recommend to check glucose twice daily, fasting and 2 hour post prandial   Hypertension: - Currently uncontrolled - Reviewed long term cardiovascular and renal outcomes of uncontrolled blood pressure - Reviewed appropriate blood pressure monitoring technique and reviewed goal blood pressure. Recommended to check home blood pressure and heart rate daily - Prescription for BP machine sent to [facility] to check home BP, especially with initiation of Jardiance. Will follow up in 2 weeks  Hyperlipidemia/ASCVD Risk Reduction: - Currently uncontrolled per last lipid panel, but due for updated.  - Recommend to continue  atorvastatin, update lipid panel with next lab work   Follow Up Plan: phone call in 2 weeks  Catie TJodi Mourning, PharmD, [facility] [phone]

## 2022-11-07 MED ORDER — EMPAGLIFLOZIN 10 MG PO TABS: 10.0000 mg | ORAL_TABLET | Freq: Every day | ORAL | 1 refills | Status: DC

## 2022-11-19 ENCOUNTER — Telehealth: Payer: Self-pay | Admitting: Neurology

## 2022-11-19 ENCOUNTER — Ambulatory Visit: Payer: Medicaid Other | Admitting: Neurology

## 2022-11-19 NOTE — Telephone Encounter (Signed)
Pt's friend cancelled appt due to scheduling conflict.

## 2022-11-20 ENCOUNTER — Other Ambulatory Visit: Payer: Medicaid Other | Admitting: Pharmacist

## 2022-11-20 ENCOUNTER — Encounter: Payer: Self-pay | Admitting: Pharmacist

## 2022-11-20 NOTE — Progress Notes (Signed)
11/20/2022 Name: Kyle Merritt MRN: [id number] DOB: 30-Aug-1961  Chief Complaint  Patient presents with   Medication Management   Diabetes   Hypertension   Hyperlipidemia    Kyle Merritt is a 61 y.o. year old male who presented for a telephone visit.   They were referred to the pharmacist by their PCP for assistance in managing hypertension.   Patient is participating in a Managed Medicaid Plan:  Yes  Subjective:  Care Team: Primary Care Provider: Fenton Foy, NP ; Next Scheduled Visit: 12/10/22  Medication Access/Adherence  Current Pharmacy:  [facility], [place] [address] [place] [address] Phone: [phone] Fax: [place], [place] - [address] [place] [address] Phone: [phone] Fax: [phone]   Patient reports affordability concerns with their medications: No  Patient reports access/transportation concerns to their pharmacy: No  Patient reports adherence concerns with their medications:  No     Diabetes:  Current medications: metformin 1000 mg twice daily, Jardiance 10 mg daily   Current glucose readings: fasting: has not checked recently; post prandial 160   Does report more frequent urination, but denies any issues with this  Hypertension:  Current medications: lisinopril/HCTZ 20/25 mg daily  Patient has a validated, automated, upper arm home BP cuff, but has not checked any readings yet. Plans to start this evening  Hyperlipidemia/ASCVD Risk Reduction  Current lipid lowering medications: atorvastatin 40 mg daily  Tobacco Abuse:  Tobacco Use History: Number of cigarettes per day 2-3; reports more acid reflux when he smokes more Reports he was not using cigarettes until he moved into his program, whether others have been smoking.   Health Maintenance  Health Maintenance Due   Topic Date Due   Zoster Vaccines- Shingrix (1 of 2) Never done   Diabetic kidney evaluation - Urine ACR  04/30/2015   OPHTHALMOLOGY EXAM  01/23/2022   FOOT EXAM  03/13/2022     Objective: Lab Results  Component Value Date   HGBA1C 6.3 10/19/2022    Lab Results  Component Value Date   CREATININE 1.61 (H) 05/30/2022   BUN 30 (H) 05/30/2022   NA 140 05/30/2022   K 4.2 05/30/2022   CL 101 05/30/2022   CO2 23 05/30/2022    Lab Results  Component Value Date   CHOL 199 08/15/2020   HDL 66 08/15/2020   LDLCALC 116 (H) 08/15/2020   TRIG 96 08/15/2020   CHOLHDL 3.0 08/15/2020    Medications Reviewed Today     Reviewed by Osker Mason, RPH-CPP (Pharmacist) on 11/06/22 at 1000  Med List Status: <None>   Medication Order Taking? Sig Documenting Provider Last Dose Status Informant  Accu-Chek Softclix Lancets lancets [id number] Yes Use as directed twice daily. Fenton Foy, NP Taking Active   atorvastatin (LIPITOR) 40 MG tablet [id number] Yes Take 1 tablet (40 mg total) by mouth every evening. Fenton Foy, NP Taking Active   blood glucose meter kit and supplies KIT [id number] Yes Dispense based on patient and insurance preference. Use up to four times daily as directed. (FOR ICD-10  E11.9). Fenton Foy, NP Taking Active   dorzolamide-timolol (COSOPT) 2-0.5 % ophthalmic solution [id number] Yes Place 1 drop into the right eye 2 (two) times daily. [provider] Taking Active   glipiZIDE (GLUCOTROL) 5 MG tablet [id number] Yes Take 0.5 tablets (2.5 mg total) by mouth daily  before breakfast. Fenton Foy, NP Taking Active   glucose blood (ACCU-CHEK GUIDE) test strip [id number] Yes Use as instructed twice daily. Fenton Foy, NP Taking Active   lisinopril-hydrochlorothiazide (ZESTORETIC) 20-25 MG tablet [id number] Yes Take 1 tablet by mouth daily. Fenton Foy, NP Taking Active   metFORMIN (GLUCOPHAGE) 1000 MG tablet [id number] Yes Take 1 tablet (1,000 mg  total) by mouth 2 (two) times daily with a meal. Fenton Foy, NP Taking Active   omeprazole (PRILOSEC) 40 MG capsule [id number] Yes Take 1 capsule (40 mg total) by mouth daily. Fenton Foy, NP Taking Active   sildenafil (VIAGRA) 100 MG tablet [id number] Yes Take 0.5 tablets (50 mg total) by mouth daily as needed for erectile dysfunction. Fenton Foy, NP Taking Active   VYZULTA 0.024 % SOLN [id number] Yes Apply 1 drop to eye every evening. [provider] Taking Active   Med List Note Jerline Pain, [place], [place] 02/08/20 1542): +3              Assessment/Plan:   Diabetes: - Currently controlled - Reviewed goal A1c, goal fasting, and goal 2 hour post prandial glucose - Recommend to continue current regimen at this time  Hypertension: - Currently uncontrolled per last office reading - Reviewed long term cardiovascular and renal outcomes of uncontrolled blood pressure - Reviewed appropriate blood pressure monitoring technique and reviewed goal blood pressure. Recommended to check home blood pressure and heart rate daily - Recommend to continue current regimen at this time. If BP remains >130/80, recommend addition of amlodipine  Hyperlipidemia/ASCVD Risk Reduction: - Currently controlled.  - Recommend to continue current regimen at this time.   Tobacco Abuse - Currently uncontrolled but extremely motivated to quit. He plans to do so today. Denies any pharmacotherapy or support needs.  - Provided motivational interviewing to assess tobacco use and strategies for reduction   Follow Up Plan: phone call in ~ 6 weeks  Catie Hedwig Morton, PharmD, [facility] [phone]

## 2022-11-21 ENCOUNTER — Other Ambulatory Visit: Payer: Medicaid Other | Admitting: Pharmacist

## 2022-11-28 DIAGNOSIS — H524 Presbyopia: Secondary | ICD-10-CM | POA: Diagnosis not present

## 2022-12-10 ENCOUNTER — Ambulatory Visit: Payer: Self-pay | Admitting: Nurse Practitioner

## 2022-12-16 DIAGNOSIS — I10 Essential (primary) hypertension: Secondary | ICD-10-CM | POA: Diagnosis not present

## 2023-01-01 ENCOUNTER — Telehealth: Payer: Self-pay

## 2023-01-01 ENCOUNTER — Other Ambulatory Visit: Payer: Medicaid Other | Admitting: Pharmacist

## 2023-01-01 NOTE — Progress Notes (Signed)
   Care Guide Note  01/01/2023 Name: Kyle Merritt MRN: [id number] DOB: 09-05-1961  Referred by: Fenton Foy, NP Reason for referral : Care Coordination (Outreach to reschedule f/u with PharmD )   Kyle Merritt is a 62 y.o. year old male who is a primary care patient of Fenton Foy, NP. Kyle Merritt was referred to the pharmacist for assistance related to DM.    An unsuccessful telephone outreach was attempted today to contact the patient who was referred to the pharmacy team for assistance with medication management. Additional attempts will be made to contact the patient.   Noreene Larsson, [place], [place] [address] Direct Dial: [phone] [email or web address]

## 2023-01-10 ENCOUNTER — Encounter: Payer: Self-pay | Admitting: Neurology

## 2023-01-10 ENCOUNTER — Ambulatory Visit: Payer: Medicaid Other | Admitting: Neurology

## 2023-01-10 VITALS — BP 125/85 | HR 88 | Ht 72.0 in | Wt 242.0 lb

## 2023-01-10 DIAGNOSIS — M542 Cervicalgia: Secondary | ICD-10-CM | POA: Diagnosis not present

## 2023-01-10 DIAGNOSIS — R202 Paresthesia of skin: Secondary | ICD-10-CM

## 2023-01-10 MED ORDER — GABAPENTIN 300 MG PO CAPS: 300.0000 mg | ORAL_CAPSULE | Freq: Three times a day (TID) | ORAL | 5 refills | Status: DC

## 2023-01-10 NOTE — Progress Notes (Signed)
Chief Complaint  Patient presents with   New Patient (Initial Visit)    Rm 15 here for evaluation numbness/tingling in bilateral arms/hands symptoms on going now for a year now. Left somewhat worse than the right.       ASSESSMENT AND PLAN  Kyle Merritt is a 62 y.o. male   Worsening neck pain, radiating pain to left shoulder and arm, Bilateral hands paresthesia  MRI of cervical spine to rule out cervical radiculopathy  Differentiation diagnoses also including bilateral carpal tunnel syndromes, EMG nerve conduction study  Gabapentin 300 mg 3 times a day   DIAGNOSTIC DATA (LABS, IMAGING, TESTING) - I reviewed patient records, labs, notes, testing and imaging myself where available.   MEDICAL HISTORY:  Kyle Merritt is a 62 year old male, seen in request by his primary care physician nurse practitioner Lazaro Arms for evaluation of hands paresthesia, initial evaluation was on January 10, 2023   I reviewed and summarized the referring note. PMHX. HLD DM  HTN Right knee replacement  He reported gradual onset left hand paresthesia since 2023, mainly involving first 3 fingers, often spread to whole hand, he has to put down his left hand at the bedside to be able to sleep  Recent few months, also noticed similar involvement of right hand, often triggered by neck posturing, such as sleeping in certain position, laying on his left side  He denies gait abnormality, denies bowel bladder incontinence  Has a long history of neck pain, getting worse over the past few months, radiating pain to left shoulder, left arm,  PHYSICAL EXAM:   Vitals:   01/10/23 1608  BP: 125/85  Pulse: 88  Weight: 242 lb (109.8 kg)  Height: 6' (1.829 m)     Body mass index is 32.82 kg/m.  PHYSICAL EXAMNIATION:  Gen: NAD, conversant, well nourised, well groomed                     Cardiovascular: Regular rate rhythm, no peripheral edema, warm, nontender. Eyes: Conjunctivae clear without  exudates or hemorrhage Neck: Supple, no carotid bruits. Pulmonary: Clear to auscultation bilaterally   NEUROLOGICAL EXAM:  MENTAL STATUS: Speech/cognition: Awake, alert, oriented to history taking and casual conversation CRANIAL NERVES: CN II: Visual fields are full to confrontation. Pupils are round equal and briskly reactive to light. CN III, IV, VI: extraocular movement are normal. No ptosis. CN V: Facial sensation is intact to light touch CN VII: Face is symmetric with normal eye closure  CN VIII: Hearing is normal to causal conversation. CN IX, X: Phonation is normal. CN XI: Head turning and shoulder shrug are intact  MOTOR: There is no pronator drift of out-stretched arms. Muscle bulk and tone are normal. Muscle strength is normal.  REFLEXES: Reflexes are 2+ and symmetric at the biceps, triceps, knees, and ankles. Plantar responses are flexor.  SENSORY: Intact to light touch, pinprick and vibratory sensation are intact in fingers and toes.  With exception of decreased pinprick at fingerpads  COORDINATION: There is no trunk or limb dysmetria noted.  GAIT/STANCE: Posture is normal. Gait is steady with normal steps, base, arm swing, and turning. Heel and toe walking are normal. Tandem gait is normal.  Romberg is absent.  REVIEW OF SYSTEMS:  Full 14 system review of systems performed and notable only for as above All other review of systems were negative.   ALLERGIES: Allergies  Allergen Reactions   Crab [Shellfish Allergy]     itching    HOME MEDICATIONS:  Current Outpatient Medications  Medication Sig Dispense Refill   Accu-Chek Softclix Lancets lancets Use as directed twice daily. 100 each 5   atorvastatin (LIPITOR) 40 MG tablet Take 1 tablet (40 mg total) by mouth every evening. 90 tablet 3   blood glucose meter kit and supplies KIT Dispense based on patient and insurance preference. Use up to four times daily as directed. (FOR ICD-10  E11.9). 1 each 0   Blood  Pressure Monitor DEVI Use to check blood pressure daily 1 each 0   dorzolamide-timolol (COSOPT) 2-0.5 % ophthalmic solution Place 1 drop into the right eye 2 (two) times daily.     empagliflozin (JARDIANCE) 10 MG TABS tablet Take 1 tablet (10 mg total) by mouth daily before breakfast. 90 tablet 1   glucose blood (ACCU-CHEK GUIDE) test strip Use as instructed twice daily. 100 each 12   lisinopril-hydrochlorothiazide (ZESTORETIC) 20-25 MG tablet Take 1 tablet by mouth daily. 90 tablet 3   metFORMIN (GLUCOPHAGE) 1000 MG tablet Take 1 tablet (1,000 mg total) by mouth 2 (two) times daily with a meal. 180 tablet 3   omeprazole (PRILOSEC) 40 MG capsule Take 1 capsule (40 mg total) by mouth daily. 90 capsule 3   sildenafil (VIAGRA) 100 MG tablet Take 0.5 tablets (50 mg total) by mouth daily as needed for erectile dysfunction. 10 tablet 11   VYZULTA 0.024 % SOLN Apply 1 drop to eye every evening.     No current facility-administered medications for this visit.    PAST MEDICAL HISTORY: Past Medical History:  Diagnosis Date   Arthritis    Colon cancer ([place])    Diabetes mellitus without complication ([place])    High cholesterol    History of colon cancer    History of colon cancer 2004   Hypertension     PAST SURGICAL HISTORY: Past Surgical History:  Procedure Laterality Date   COLON SURGERY     KNEE ARTHROSCOPY Bilateral    TOTAL KNEE ARTHROPLASTY Right 11/30/2019   Procedure: RIGHT TOTAL KNEE ARTHROPLASTY;  Surgeon: Frederik Pear, MD;  Location: [facility];  Service: Orthopedics;  Laterality: Right;   TRIGGER FINGER RELEASE Right 05/22/2019    FAMILY HISTORY: Family History  Problem Relation Age of Onset   Diabetes Mother    Hypertension Mother    Colon cancer Paternal Uncle    Esophageal cancer Neg Hx    Inflammatory bowel disease Neg Hx    Liver disease Neg Hx    Pancreatic cancer Neg Hx    Stomach cancer Neg Hx     SOCIAL HISTORY: Social History   Socioeconomic History   Marital  status: Divorced    Spouse name: Not on file   Number of children: Not on file   Years of education: Not on file   Highest education level: Not on file  Occupational History   Not on file  Tobacco Use   Smoking status: Former    Packs/day: 0.25    Types: Cigarettes    Quit date: 2018    Years since quitting: 6.0   Smokeless tobacco: Never  Vaping Use   Vaping Use: Never used  Substance and Sexual Activity   Alcohol use: No   Drug use: No   Sexual activity: Yes  Other Topics Concern   Not on file  Social History Narrative   Not on file   Social Determinants of Health   Financial Resource Strain: Not on file  Food Insecurity: Not on file  Transportation Needs: Not  on file  Physical Activity: Not on file  Stress: Not on file  Social Connections: Not on file  Intimate Partner Violence: Not on file      Marcial Pacas, M.D. Ph.D.  [facility] [address] [place], [place] [address] Ph: [phone] Fax: [phone]  CC:  Fenton Foy, NP [address] [place],  [place] [address]  Fenton Foy, NP

## 2023-01-11 ENCOUNTER — Encounter: Payer: Self-pay | Admitting: Podiatry

## 2023-01-11 ENCOUNTER — Ambulatory Visit (INDEPENDENT_AMBULATORY_CARE_PROVIDER_SITE_OTHER): Payer: Medicaid Other | Admitting: Podiatry

## 2023-01-11 VITALS — BP 154/87

## 2023-01-11 DIAGNOSIS — E1169 Type 2 diabetes mellitus with other specified complication: Secondary | ICD-10-CM

## 2023-01-11 DIAGNOSIS — M79609 Pain in unspecified limb: Secondary | ICD-10-CM

## 2023-01-11 DIAGNOSIS — B351 Tinea unguium: Secondary | ICD-10-CM

## 2023-01-11 DIAGNOSIS — E119 Type 2 diabetes mellitus without complications: Secondary | ICD-10-CM

## 2023-01-11 NOTE — Progress Notes (Signed)
ANNUAL DIABETIC FOOT EXAM  Subjective: Kyle Merritt presents today for annual diabetic foot examination.  Chief Complaint  Patient presents with   Nail Problem    DFC BS-did not check today A1C-6.5 PCP-Tonya Nichols PCP VST- 3 months ago   Patient confirms h/o diabetes.  Patient relates 5-6 year h/o diabetes.  Patient denies any h/o foot wounds.  Patient denies any numbness, tingling, burning, or pins/needle sensation in feet.  Risk factors: diabetes, HTN, dyslipidemia, h/o tobacco use in remission.  Fenton Foy, NP is patient's PCP.   Past Medical History:  Diagnosis Date   Arthritis    Colon cancer ([place])    Diabetes mellitus without complication ([place])    High cholesterol    History of colon cancer    History of colon cancer 2004   Hypertension    Patient Active Problem List   Diagnosis Date Noted   Paresthesia 01/10/2023   Neck pain on left side 01/10/2023   Hemoglobin A1c less than 7.0% 05/19/2020   Generalized abdominal pain 05/19/2020   Elevated sed rate 01/07/2020   Elevated C-reactive protein (CRP) 01/07/2020   S/P TKR (total knee replacement), right 11/30/2019   Osteoarthritis of right knee 11/26/2019   Constipation 10/06/2018   Olecranon bursitis of left elbow 06/04/2018   Trigger finger, acquired 06/04/2018   Cervical disc disorder with radiculopathy of cervical region 05/08/2018   Other chronic pain 02/04/2018   History of cocaine use 02/04/2018   Hyperlipidemia 02/04/2018   Colon cancer screening 11/29/2014   Knee pain, acute 10/04/2014   Dental abscess 08/02/2014   Diabetes ([place]) 04/29/2014   HTN (hypertension) 04/29/2014   Dyslipidemia 04/29/2014   History of colon cancer 04/29/2014   Back pain 04/29/2014   Erectile dysfunction 04/29/2014   Essential hypertension 07/27/2013   Tobacco abuse 07/27/2013   Pure hypercholesterolemia 12/31/2008   TESTOSTERONE DEFICIENCY 12/10/2008   GERD 12/08/2008   ONYCHOMYCOSIS, BILATERAL 11/09/2008    DIABETES MELLITUS 11/09/2008   Anemia 11/09/2008   Malignant neoplasm of colon ([place]) 12/24/2002   Past Surgical History:  Procedure Laterality Date   COLON SURGERY     KNEE ARTHROSCOPY Bilateral    TOTAL KNEE ARTHROPLASTY Right 11/30/2019   Procedure: RIGHT TOTAL KNEE ARTHROPLASTY;  Surgeon: Frederik Pear, MD;  Location: [facility];  Service: Orthopedics;  Laterality: Right;   TRIGGER FINGER RELEASE Right 05/22/2019   Current Outpatient Medications on File Prior to Visit  Medication Sig Dispense Refill   Accu-Chek Softclix Lancets lancets Use as directed twice daily. 100 each 5   atorvastatin (LIPITOR) 40 MG tablet Take 1 tablet (40 mg total) by mouth every evening. 90 tablet 3   blood glucose meter kit and supplies KIT Dispense based on patient and insurance preference. Use up to four times daily as directed. (FOR ICD-10  E11.9). 1 each 0   Blood Pressure Monitor DEVI Use to check blood pressure daily 1 each 0   dorzolamide-timolol (COSOPT) 2-0.5 % ophthalmic solution Place 1 drop into the right eye 2 (two) times daily.     empagliflozin (JARDIANCE) 10 MG TABS tablet Take 1 tablet (10 mg total) by mouth daily before breakfast. 90 tablet 1   gabapentin (NEURONTIN) 300 MG capsule Take 1 capsule (300 mg total) by mouth 3 (three) times daily. 90 capsule 5   glucose blood (ACCU-CHEK GUIDE) test strip Use as instructed twice daily. 100 each 12   lisinopril-hydrochlorothiazide (ZESTORETIC) 20-25 MG tablet Take 1 tablet by mouth daily. 90 tablet 3  metFORMIN (GLUCOPHAGE) 1000 MG tablet Take 1 tablet (1,000 mg total) by mouth 2 (two) times daily with a meal. 180 tablet 3   omeprazole (PRILOSEC) 40 MG capsule Take 1 capsule (40 mg total) by mouth daily. 90 capsule 3   sildenafil (VIAGRA) 100 MG tablet Take 0.5 tablets (50 mg total) by mouth daily as needed for erectile dysfunction. 10 tablet 11   VYZULTA 0.024 % SOLN Apply 1 drop to eye every evening.     No current facility-administered medications  on file prior to visit.    Allergies  Allergen Reactions   Crab [Shellfish Allergy]     itching   Social History   Occupational History   Not on file  Tobacco Use   Smoking status: Former    Packs/day: 0.25    Types: Cigarettes    Quit date: 2018    Years since quitting: 6.0   Smokeless tobacco: Never  Vaping Use   Vaping Use: Never used  Substance and Sexual Activity   Alcohol use: No   Drug use: No   Sexual activity: Yes   Family History  Problem Relation Age of Onset   Diabetes Mother    Hypertension Mother    Colon cancer Paternal Uncle    Esophageal cancer Neg Hx    Inflammatory bowel disease Neg Hx    Liver disease Neg Hx    Pancreatic cancer Neg Hx    Stomach cancer Neg Hx    Immunization History  Administered Date(s) Administered   Influenza Whole 12/08/2008, 02/03/2010   Influenza,inj,Quad PF,6+ Mos 10/17/2018, 08/26/2019, 12/12/2020, 10/19/2022   Pneumococcal Polysaccharide-23 03/24/2009, 01/29/2018   Td 12/08/2008   Tdap 01/29/2018     Review of Systems: Negative except as noted in the HPI.   Objective: Vitals:   01/11/23 0834  BP: (!) 154/87   Kyle Merritt is a pleasant 62 y.o. male in NAD. AAO X 3.  Vascular Examination: CFT <3 seconds b/l LE. Palpable DP pulse(s) b/l LE. Palpable PT pulse(s) b/l LE. Pedal hair sparse. No pain with calf compression b/l. Lower extremity skin temperature gradient within normal limits. No edema noted b/l LE. No cyanosis or clubbing noted b/l LE.  Dermatological Examination: Pedal integument with normal turgor, texture and tone b/l LE. No open wounds b/l. No interdigital macerations b/l. Toenails 1-5 b/l elongated, thickened, discolored with subungual debris. +Tenderness with dorsal palpation of nailplates. No hyperkeratotic or porokeratotic lesions present.  Neurological Examination: Protective sensation intact 5/5 intact bilaterally with 10g monofilament b/l. Vibratory sensation intact b/l. Proprioception intact  bilaterally.  Musculoskeletal Examination: Muscle strength 5/5 to all lower extremity muscle groups bilaterally. No pain, crepitus or joint limitation noted with ROM bilateral LE. No gross bony deformities bilaterally. Patient ambulates independent of any assistive aids.  Footwear Assessment: Does the patient wear appropriate shoes? Yes. Does the patient need inserts/orthotics? No.  ADA Risk Categorization: Low Risk :  Patient has all of the following: Intact protective sensation No prior foot ulcer  No severe deformity Pedal pulses present  Assessment: 1. Pain due to onychomycosis of nail   2. Type 2 diabetes mellitus with other specified complication, without long-term current use of insulin ([place])   3. Encounter for diabetic foot exam [facility])     Plan:  -Patient was evaluated and treated. All patient's and/or POA's questions/concerns answered on today's visit. -Diabetic foot examination performed today. -Discussed and educated patient on diabetic foot care, especially with  regards to the vascular, neurological and musculoskeletal systems.  Stressed the importance of good glycemic control and the detriment of not  controlling glucose levels in relation to the foot. -Patient to continue soft, supportive shoe gear daily. -Toenails 1-5 b/l were debrided in length and girth with sterile nail nippers and dremel without iatrogenic bleeding.  -Patient/POA to call should there be question/concern in the interim. Return in about 3 months (around 04/12/2023).  Marzetta Board, DPM

## 2023-01-14 ENCOUNTER — Telehealth: Payer: Self-pay | Admitting: Neurology

## 2023-01-14 LAB — HM DIABETES EYE EXAM

## 2023-01-14 NOTE — Telephone Encounter (Signed)
2 [facility] [place]: [id number] exp. /22/24-3/21/24 sent to GI [phone]

## 2023-01-18 ENCOUNTER — Encounter: Payer: Self-pay | Admitting: Pharmacist

## 2023-01-21 ENCOUNTER — Ambulatory Visit: Payer: Medicaid Other | Admitting: Nurse Practitioner

## 2023-01-21 ENCOUNTER — Encounter: Payer: Self-pay | Admitting: Nurse Practitioner

## 2023-01-21 VITALS — BP 124/82 | HR 88 | Temp 97.5°F | Ht 71.25 in | Wt 239.4 lb

## 2023-01-21 DIAGNOSIS — E1169 Type 2 diabetes mellitus with other specified complication: Secondary | ICD-10-CM

## 2023-01-21 DIAGNOSIS — Z13220 Encounter for screening for lipoid disorders: Secondary | ICD-10-CM

## 2023-01-21 LAB — POCT GLYCOSYLATED HEMOGLOBIN (HGB A1C)
HbA1c POC (<> result, manual entry): 9.1 % (ref 4.0–5.6)
HbA1c, POC (controlled diabetic range): 9.1 % — AB (ref 0.0–7.0)
HbA1c, POC (prediabetic range): 9.1 % — AB (ref 5.7–6.4)
Hemoglobin A1C: 9.1 % — AB (ref 4.0–5.6)

## 2023-01-21 NOTE — Patient Instructions (Signed)
1. Type 2 diabetes mellitus with other specified complication, without long-term current use of insulin (HCC)  - Microalbumin/Creatinine Ratio, Urine - POCT glycosylated hemoglobin (Hb A1C) - CBC - Comprehensive metabolic panel - Lipid Panel - Hemoglobin A1c  2. Lipid screening  - Lipid Panel   Follow up:  Follow up in 3 months

## 2023-01-21 NOTE — Progress Notes (Signed)
   Care Guide Note  01/21/2023 Name: Kyle Merritt MRN: [id number] DOB: [birth year]  Referred by: Fenton Foy, NP Reason for referral : Care Coordination (Outreach to reschedule f/u with PharmD )   Kyle Merritt is a 62 y.o. year old male who is a primary care patient of Fenton Foy, NP. Kyle Merritt was referred to the pharmacist for assistance related to DM.    Successful contact was made with the patient to discuss pharmacy services including being ready for the pharmacist to call at least 5 minutes before the scheduled appointment time, to have medication bottles and any blood sugar or blood pressure readings ready for review. The patient agreed to meet with the pharmacist via with the pharmacist via telephone visit on (date/time).  02/04/2023  Kyle Merritt, [place], [place] [address] Direct Dial: [phone] [email or web address]

## 2023-01-21 NOTE — Progress Notes (Signed)
Subjective:    Patient ID: Kyle Merritt, male    DOB: 11/28/1961, 62 y.o.   MRN: [id number]  Kyle Merritt is a 62 y.o. male who presents for follow-up of Type 2 diabetes mellitus.  Kyle Merritt presents for follow up. Kyle Merritt  has a past medical history of Arthritis, Colon cancer (Kyle Merritt), Diabetes mellitus without complication (Kyle Merritt), High cholesterol, History of colon cancer, History of colon cancer (2004), and Hypertension.      Diabetes Mellitus Patient presents for follow up of diabetes. Current symptoms include: hyperglycemia and weight gain. Patient denies foot ulcerations, hypoglycemia , increased appetite, nausea, paresthesia of the feet, polydipsia and polyuria. Evaluation to date has included: fasting blood sugar, fasting lipid panel, hemoglobin A1C and microalbuminuria.  Home sugars: not checking. Current treatment: more intensive attention to diet which has not been effective, Continued metformin which has been effective, Continued statin which has been effective and Continued ACE inhibitor/ARB which has been effective.  Patient is not checking home blood sugars.   Home blood sugar records: patient does not check sugars How often is blood sugars being checked:  Current symptoms/problems include none and have been worsening.  The following portions of the patient's history were reviewed and updated as appropriate: allergies, current medications, past medical history, past social history and problem list.  Review of Systems  Constitutional: Negative.   HENT: Negative.    Eyes: Negative.   Respiratory: Negative.    Cardiovascular: Negative.   Gastrointestinal: Negative.   Genitourinary: Negative.   Musculoskeletal: Negative.   Skin: Negative.   Neurological: Negative.   Endo/Heme/Allergies: Negative.   Psychiatric/Behavioral: Negative.          Objective:     Physical Exam Constitutional:      General: Kyle Merritt is not in acute distress. Cardiovascular:     Rate and Rhythm:  Normal rate and regular rhythm.  Pulmonary:     Effort: Pulmonary effort is normal.     Breath sounds: Normal breath sounds.  Skin:    General: Skin is warm and dry.  Neurological:     Mental Status: Kyle Merritt is alert and oriented to person, place, and time.  Psychiatric:        Mood and Affect: Affect normal.      Blood pressure 124/82, pulse 88, temperature (!) 97.5 F (36.4 C), temperature source Temporal, height 5' 11.25" (1.81 m), weight 239 lb 6.4 oz (108.6 kg), SpO2 100 %.  Lab Review    Latest Ref Rng & Units 01/21/2023    9:57 AM 10/19/2022   10:59 AM 09/10/2022   10:48 AM 05/30/2022    3:54 PM 03/13/2021    8:44 AM  Diabetic Labs  HbA1c 5.7 - 6.4 % 0.0 - 7.0 % 4.0 - 5.6 % 4.0 - 5.6 % 9.1    9.1    9.1    9.1  6.3  7.7    7.7    7.7    7.7  7.3  7.2   Creatinine 0.76 - 1.27 mg/dL    1.61        01/21/2023    9:46 AM 01/11/2023    8:34 AM 01/10/2023    4:08 PM 10/19/2022   10:49 AM 09/27/2022    3:35 PM  BP/Weight  Systolic BP 952 841 324 401 027  Diastolic BP 82 87 85 85 90  Wt. (Lbs) 239.4  242 240   BMI 33.16 kg/m2  32.82 kg/m2 32.55 kg/m2       Latest Ref  Rng & Units 01/14/2023   12:00 AM 04/28/2018    1:40 PM  Foot/eye exam completion dates  Eye Exam No Retinopathy No Retinopathy       Foot Form Completion   Done     This result is from an external source.    Kyle Merritt  reports that Kyle Merritt quit smoking about 6 years ago. His smoking use included cigarettes. Kyle Merritt smoked an average of .25 packs per day. Kyle Merritt has never used smokeless tobacco. Kyle Merritt reports that Kyle Merritt does not drink alcohol and does not use drugs.     Assessment & Plan:    Type 2 diabetes mellitus with other specified complication, without long-term current use of insulin (HCC) - Plan: Microalbumin/Creatinine Ratio, Urine, POCT glycosylated hemoglobin (Hb A1C), CBC, Comprehensive metabolic panel, Lipid Panel, Hemoglobin A1c  Lipid screening - Plan: Lipid Panel  Rx changes:  will follow with  pharmacy Education: Reviewed 'ABCs' of diabetes management (respective goals in parentheses):  A1C (<7), blood pressure (<130/80), and cholesterol (LDL <100). Compliance at present is estimated to be excellent. Efforts to improve compliance (if necessary) will be directed at increased exercise. Follow up: 3 months  1. Type 2 diabetes mellitus with other specified complication, without long-term current use of insulin (HCC)  - Microalbumin/Creatinine Ratio, Urine - POCT glycosylated hemoglobin (Hb A1C) - CBC - Comprehensive metabolic panel - Lipid Panel - Hemoglobin A1c  2. Lipid screening  - Lipid Panel   Follow up:  Follow up in 3 months  Kyle Arms, FNP-C 01/21/23

## 2023-01-22 LAB — COMPREHENSIVE METABOLIC PANEL
ALT: 26 IU/L (ref 0–44)
AST: 21 IU/L (ref 0–40)
Albumin/Globulin Ratio: 1.7 (ref 1.2–2.2)
Albumin: 4.6 g/dL (ref 3.9–4.9)
Alkaline Phosphatase: 118 IU/L (ref 44–121)
BUN/Creatinine Ratio: 19 (ref 10–24)
BUN: 32 mg/dL — ABNORMAL HIGH (ref 8–27)
Bilirubin Total: 0.4 mg/dL (ref 0.0–1.2)
CO2: 24 mmol/L (ref 20–29)
Calcium: 9.8 mg/dL (ref 8.6–10.2)
Chloride: 98 mmol/L (ref 96–106)
Creatinine, Ser: 1.65 mg/dL — ABNORMAL HIGH (ref 0.76–1.27)
Globulin, Total: 2.7 g/dL (ref 1.5–4.5)
Glucose: 168 mg/dL — ABNORMAL HIGH (ref 70–99)
Potassium: 4.3 mmol/L (ref 3.5–5.2)
Sodium: 139 mmol/L (ref 134–144)
Total Protein: 7.3 g/dL (ref 6.0–8.5)
eGFR: 46 mL/min/{1.73_m2} — ABNORMAL LOW (ref >59)

## 2023-01-22 LAB — CBC
Hematocrit: 39.8 % (ref 37.5–51.0)
Hemoglobin: 13.1 g/dL (ref 13.0–17.7)
MCH: 26.7 pg (ref 26.6–33.0)
MCHC: 32.9 g/dL (ref 31.5–35.7)
MCV: 81 fL (ref 79–97)
Platelets: 311 10*3/uL (ref 150–450)
RBC: 4.91 x10E6/uL (ref 4.14–5.80)
RDW: 13.6 % (ref 11.6–15.4)
WBC: 7.8 10*3/uL (ref 3.4–10.8)

## 2023-01-22 LAB — LIPID PANEL
Chol/HDL Ratio: 3.5 ratio (ref 0.0–5.0)
Cholesterol, Total: 159 mg/dL (ref 100–199)
HDL: 46 mg/dL (ref >39)
LDL Chol Calc (NIH): 88 mg/dL (ref 0–99)
Triglycerides: 140 mg/dL (ref 0–149)
VLDL Cholesterol Cal: 25 mg/dL (ref 5–40)

## 2023-01-22 LAB — HEMOGLOBIN A1C
Est. average glucose Bld gHb Est-mCnc: 214 mg/dL
Hgb A1c MFr Bld: 9.1 % — ABNORMAL HIGH (ref 4.8–5.6)

## 2023-01-23 LAB — MICROALBUMIN / CREATININE URINE RATIO
Creatinine, Urine: 76.1 mg/dL
Microalb/Creat Ratio: 8 mg/g creat (ref 0–29)
Microalbumin, Urine: 6.1 ug/mL

## 2023-01-25 ENCOUNTER — Other Ambulatory Visit: Payer: Self-pay | Admitting: Nurse Practitioner

## 2023-01-25 DIAGNOSIS — N289 Disorder of kidney and ureter, unspecified: Secondary | ICD-10-CM

## 2023-01-28 ENCOUNTER — Ambulatory Visit
Admission: RE | Admit: 2023-01-28 | Discharge: 2023-01-28 | Disposition: A | Discharge status: Home / Self Care | Payer: Medicaid Other | Source: Ambulatory Visit | Attending: Neurology | Admitting: Neurology

## 2023-01-28 DIAGNOSIS — M542 Cervicalgia: Secondary | ICD-10-CM | POA: Diagnosis not present

## 2023-01-28 DIAGNOSIS — R202 Paresthesia of skin: Secondary | ICD-10-CM | POA: Diagnosis not present

## 2023-01-28 DIAGNOSIS — M5412 Radiculopathy, cervical region: Secondary | ICD-10-CM | POA: Diagnosis not present

## 2023-02-04 ENCOUNTER — Other Ambulatory Visit: Payer: Medicaid Other | Admitting: Pharmacist

## 2023-02-04 MED ORDER — EMPAGLIFLOZIN 25 MG PO TABS: 25.0000 mg | ORAL_TABLET | Freq: Every day | ORAL | 3 refills | Status: DC

## 2023-02-04 NOTE — Patient Instructions (Signed)
[place],   Increase Jardiance to 25 mg daily.   Check your blood sugars twice daily:  1) Fasting, first thing in the morning before breakfast and  2) 2 hours after your largest meal.   For a goal A1c of less than 7%, goal fasting readings are less than 130 and goal 2 hour after meal readings are less than 180.    Take care!  Catie Hedwig Morton, PharmD, [place], [facility] [phone]

## 2023-02-04 NOTE — Progress Notes (Signed)
02/04/2023 Name: Kyle Merritt MRN: [id number] DOB: 01-14-1961  Chief Complaint  Patient presents with   Medication Management   Diabetes   Hypertension   Hyperlipidemia    Kyle Merritt is a 62 y.o. year old male who presented for a telephone visit.   They were referred to the pharmacist by their PCP for assistance in managing diabetes, hypertension, and hyperlipidemia.   Patient is participating in a Managed Medicaid Plan:  Yes  Subjective:  Care Team: Primary Care Provider: Fenton Foy, NP ; Next Scheduled Visit: 04/24/23  Medication Access/Adherence  Current Pharmacy:  [facility] [address] [place] [address] Phone: [phone] Fax: [place], [place] - [address] [place] [address] Phone: [phone] Fax: [phone]   Patient reports affordability concerns with their medications: No  Patient reports access/transportation concerns to their pharmacy: No  Patient reports adherence concerns with their medications:  No     Diabetes:  Current medications: metformin 1000 mg twice daily, Jardiance 10 mg daily  Current glucose readings: reports this morning was 130  Patient denies hypoglycemic s/sx including dizziness, shakiness, sweating. Patient denies hyperglycemic symptoms including polyuria, polydipsia, polyphagia, nocturia, neuropathy, blurred vision.  Current meal patterns:  - cut back on rice, pasta, potatoes; focusing more on chicken, pork, vegetables - Drinks: has significantly cut back on sodas and juices   Referral placed to nephrology  Hypertension:  Current medications: lisinopril/HCTZ 20/25 mg daily  Patient reports hypotensive s/sx including dizziness, lightheadedness.  Patient denies hypertensive symptoms including headache, chest pain, shortness of  breath   Hyperlipidemia/ASCVD Risk Reduction  Current lipid lowering medications: atorvastatin 40 mg daily    Objective:  Lab Results  Component Value Date   HGBA1C 9.1 (H) 01/21/2023    Lab Results  Component Value Date   CREATININE 1.65 (H) 01/21/2023   BUN 32 (H) 01/21/2023   NA 139 01/21/2023   K 4.3 01/21/2023   CL 98 01/21/2023   CO2 24 01/21/2023    Lab Results  Component Value Date   CHOL 159 01/21/2023   HDL 46 01/21/2023   LDLCALC 88 01/21/2023   TRIG 140 01/21/2023   CHOLHDL 3.5 01/21/2023    Medications Reviewed Today     Reviewed by Osker Mason, RPH-CPP (Pharmacist) on 02/04/23 at 46  Med List Status: <None>   Medication Order Taking? Sig Documenting Provider Last Dose Status Informant  Accu-Chek Softclix Lancets lancets [id number]  Use as directed twice daily. Kyle Foy, NP  Active   atorvastatin (LIPITOR) 40 MG tablet [id number] Yes Take 1 tablet (40 mg total) by mouth every evening. Kyle Foy, NP Taking Active   blood glucose meter kit and supplies KIT [id number]  Dispense based on patient and insurance preference. Use up to four times daily as directed. (FOR ICD-10  E11.9). Kyle Foy, NP  Active   Blood Pressure Monitor DEVI [id number]  Use to check blood pressure daily Kyle Foy, NP  Active   dorzolamide-timolol (COSOPT) 2-0.5 % ophthalmic solution [id number] Yes Place 1 drop into the right eye 2 (two) times daily. [provider] Taking Active   empagliflozin (JARDIANCE) 10 MG TABS tablet [id number] Yes Take 1 tablet (10 mg total) by mouth daily before breakfast. Kyle Foy, NP Taking Active   gabapentin (NEURONTIN) 300 MG capsule [id number] Yes Take 1 capsule (300 mg  total) by mouth 3 (three) times daily. Marcial Pacas, MD Taking Active            Med Note Jodi Mourning, Toney Reil Feb 04, 2023  2:05 PM) Taking 2 capsules every evening  glucose blood (ACCU-CHEK GUIDE) test strip [id number]  Use as  instructed twice daily. Kyle Foy, NP  Active   lisinopril-hydrochlorothiazide (ZESTORETIC) 20-25 MG tablet [id number] Yes Take 1 tablet by mouth daily. Kyle Foy, NP Taking Active   metFORMIN (GLUCOPHAGE) 1000 MG tablet [id number] Yes Take 1 tablet (1,000 mg total) by mouth 2 (two) times daily with a meal. Kyle Foy, NP Taking Active   omeprazole (PRILOSEC) 40 MG capsule [id number] Yes Take 1 capsule (40 mg total) by mouth daily. Kyle Foy, NP Taking Active   sildenafil (VIAGRA) 100 MG tablet [id number]  Take 0.5 tablets (50 mg total) by mouth daily as needed for erectile dysfunction. Kyle Foy, NP  Active   VYZULTA 0.024 % SOLN [id number] Yes Apply 1 drop to eye every evening. [provider] Taking Active   Med List Note Jerline Pain, [place], [place] 02/08/20 1542): +3              Assessment/Plan:   Diabetes: - Currently uncontrolled - Reviewed long term cardiovascular and renal outcomes of uncontrolled blood sugar - Reviewed goal A1c, goal fasting, and goal 2 hour post prandial glucose - Reviewed dietary modifications including: praised for cutting back on sodas, carbohydrate - Recommend to increase Jardiance to 25 mg daily. Discussed with PCP, she is in agreement. Continue metformin at this dose while eGFR remains >45.  - Recommend to check glucose twice daily, fasting and 2 hour post prandial - Contacted pharmacy; they will put Jardiance 25 mg dose in his pill packs for this month  Hypertension: - Currently controlled - Reviewed long term cardiovascular and renal outcomes of uncontrolled blood pressure - Reviewed appropriate blood pressure monitoring technique and reviewed goal blood pressure. Recommended to check home blood pressure and heart rate periodically - Recommend to continue current regimen   Hyperlipidemia/ASCVD Risk Reduction: - Currently controlled.  - Recommend to continue current reigmen     Follow Up Plan: phone call in 8  weeks  Catie TJodi Mourning, PharmD, [place], [facility] [phone]

## 2023-02-28 ENCOUNTER — Telehealth: Payer: Self-pay | Admitting: Neurology

## 2023-02-28 NOTE — Telephone Encounter (Signed)
Pt came into office, states he has gotten his MRI over a month ago and has not gotten a call with results.  Pt would like a call to discuss results. [phone]

## 2023-03-25 ENCOUNTER — Other Ambulatory Visit: Payer: Medicaid Other | Admitting: Pharmacist

## 2023-03-25 NOTE — Progress Notes (Signed)
03/25/2023 Name: Kyle Merritt MRN: [id number] DOB: 1961/10/29  Chief Complaint  Patient presents with   Medication Management   Diabetes   Hypertension    Kyle Merritt is a 62 y.o. year old male who presented for a telephone visit.   They were referred to the pharmacist by their PCP for assistance in managing diabetes, hypertension, and hyperlipidemia.   Patient is participating in a Managed Medicaid Plan:  Yes  Subjective:  Care Team: Primary Care Provider: Fenton Foy, NP ; Next Scheduled Visit: 04/23/22 Nephrology: Hollie Salk; 03/29/23 Neurology: Krista Blue; 04/17/23  Medication Access/Adherence  Current Pharmacy:  [facility] [address] [place] [address] [address] Phone: [phone] Fax: [place], [place] - [address] [place] [address] Phone: [phone] Fax: [phone]   Patient reports affordability concerns with their medications: No  Patient reports access/transportation concerns to their pharmacy: No  Patient reports adherence concerns with their medications:  No     Diabetes:  Current medications: metformin 1000 mg twice daily, Jardiance 25 mg daily  Current glucose readings: 120-130s  Patient denies hypoglycemic s/sx including dizziness, shakiness, sweating. Patient denies hyperglycemic symptoms including polyuria, polydipsia, polyphagia, nocturia, neuropathy, blurred vision.  Current meal patterns: confirms that he has worked to cut down on pasta/potatoes/rice; cutting back on soda (down to once weekly).   Hypertension:  Current medications: lisinopril/HCTZ 20/25 mg daily   Patient has a validated, automated, upper arm home BP cuff Current blood pressure readings readings: reports home readings SBP 130s  Patient denies hypotensive s/sx including dizziness, lightheadedness.  Patient denies  hypertensive symptoms including headache, chest pain, shortness of breath  Confirms his upcoming appointment with nephrology on Friday   Hyperlipidemia/ASCVD Risk Reduction  Current lipid lowering medications: atorvastatin 40 mg daily    Objective:  Lab Results  Component Value Date   HGBA1C 9.1 (H) 01/21/2023    Lab Results  Component Value Date   CREATININE 1.65 (H) 01/21/2023   BUN 32 (H) 01/21/2023   NA 139 01/21/2023   K 4.3 01/21/2023   CL 98 01/21/2023   CO2 24 01/21/2023    Lab Results  Component Value Date   CHOL 159 01/21/2023   HDL 46 01/21/2023   LDLCALC 88 01/21/2023   TRIG 140 01/21/2023   CHOLHDL 3.5 01/21/2023    Medications Reviewed Today     Reviewed by Osker Mason, RPH-CPP (Pharmacist) on 03/25/23 at [place] List Status: <None>   Medication Order Taking? Sig Documenting Provider Last Dose Status Informant  Accu-Chek Softclix Lancets lancets [id number] No Use as directed twice daily. Fenton Foy, NP Taking Active   atorvastatin (LIPITOR) 40 MG tablet [id number] No Take 1 tablet (40 mg total) by mouth every evening. Fenton Foy, NP Taking Active   blood glucose meter kit and supplies KIT [id number] No Dispense based on patient and insurance preference. Use up to four times daily as directed. (FOR ICD-10  E11.9). Fenton Foy, NP Taking Active   Blood Pressure Monitor DEVI [id number] No Use to check blood pressure daily Fenton Foy, NP Taking Active   dorzolamide-timolol (COSOPT) 2-0.5 % ophthalmic solution [id number] No Place 1 drop into the right eye 2 (two) times daily. [provider] Taking Active   empagliflozin (JARDIANCE) 25 MG TABS tablet [id number]  Take 1 tablet (25 mg total) by mouth daily before breakfast. Nils Pyle,  Kriste Basque, NP  Active   gabapentin (NEURONTIN) 300 MG capsule [id number] No Take 1 capsule (300 mg total) by mouth 3 (three) times daily. Marcial Pacas, MD Taking Active            Med Note Jodi Mourning,  Toney Reil Feb 04, 2023  2:05 PM) Taking 2 capsules every evening  glucose blood (ACCU-CHEK GUIDE) test strip [id number] No Use as instructed twice daily. Fenton Foy, NP Taking Active   lisinopril-hydrochlorothiazide (ZESTORETIC) 20-25 MG tablet [id number] No Take 1 tablet by mouth daily. Fenton Foy, NP Taking Active   metFORMIN (GLUCOPHAGE) 1000 MG tablet [id number] No Take 1 tablet (1,000 mg total) by mouth 2 (two) times daily with a meal. Fenton Foy, NP Taking Active   omeprazole (PRILOSEC) 40 MG capsule [id number] No Take 1 capsule (40 mg total) by mouth daily. Fenton Foy, NP Taking Active   sildenafil (VIAGRA) 100 MG tablet [id number] No Take 0.5 tablets (50 mg total) by mouth daily as needed for erectile dysfunction. Fenton Foy, NP Taking Active   VYZULTA 0.024 % SOLN [id number] No Apply 1 drop to eye every evening. [provider] Taking Active   Med List Note Jerline Pain, [place], [place] 02/08/20 1542): +3            SDOH Interventions Today    Flowsheet Row Most Recent Value  SDOH Interventions   Financial Strain Interventions Intervention Not Indicated       Assessment/Plan:   Diabetes: - Currently uncontrolled but anticipate improvement - Reviewed long term cardiovascular and renal outcomes of uncontrolled blood sugar - Reviewed goal A1c, goal fasting, and goal 2 hour post prandial glucose - Reviewed dietary modifications including: praised for continued focus on minimizing carbohydrates/sugars - Recommend to continue current regimen at this time. If next A1c remains elevated, recommend addition of GLP1  - Recommend to check glucose daily, fasting and 2 hour post prandial  Hypertension: - Currently controlled - Reviewed long term cardiovascular and renal outcomes of uncontrolled blood pressure - Reviewed appropriate blood pressure monitoring technique and reviewed goal blood pressure. Recommended to check home blood pressure and heart  rate daily - Recommend to continue current regimen at this time  Hyperlipidemia/ASCVD Risk Reduction: - Currently controlled.  - Recommend to continue current regimen at this time   Follow Up Plan: phone call in 8 weeks  Catie Hedwig Morton, PharmD, [place], [facility] [phone]

## 2023-03-29 DIAGNOSIS — I129 Hypertensive chronic kidney disease with stage 1 through stage 4 chronic kidney disease, or unspecified chronic kidney disease: Secondary | ICD-10-CM | POA: Diagnosis not present

## 2023-03-29 DIAGNOSIS — N1831 Chronic kidney disease, stage 3a: Secondary | ICD-10-CM | POA: Diagnosis not present

## 2023-03-29 DIAGNOSIS — E1122 Type 2 diabetes mellitus with diabetic chronic kidney disease: Secondary | ICD-10-CM | POA: Diagnosis not present

## 2023-03-29 DIAGNOSIS — D649 Anemia, unspecified: Secondary | ICD-10-CM | POA: Diagnosis not present

## 2023-03-29 DIAGNOSIS — E785 Hyperlipidemia, unspecified: Secondary | ICD-10-CM | POA: Diagnosis not present

## 2023-04-16 ENCOUNTER — Other Ambulatory Visit (HOSPITAL_COMMUNITY): Payer: Self-pay | Admitting: Nephrology

## 2023-04-16 DIAGNOSIS — R600 Localized edema: Secondary | ICD-10-CM

## 2023-04-17 ENCOUNTER — Ambulatory Visit: Payer: Medicaid Other | Admitting: Neurology

## 2023-04-17 ENCOUNTER — Ambulatory Visit (HOSPITAL_COMMUNITY)
Admission: RE | Admit: 2023-04-17 | Discharge: 2023-04-17 | Disposition: A | Discharge status: Home / Self Care | Payer: Medicaid Other | Source: Ambulatory Visit | Attending: Nephrology | Admitting: Nephrology

## 2023-04-17 ENCOUNTER — Telehealth: Payer: Self-pay | Admitting: Neurology

## 2023-04-17 VITALS — BP 128/88 | HR 90 | Ht 71.0 in | Wt 242.0 lb

## 2023-04-17 DIAGNOSIS — G5603 Carpal tunnel syndrome, bilateral upper limbs: Secondary | ICD-10-CM

## 2023-04-17 DIAGNOSIS — M5412 Radiculopathy, cervical region: Secondary | ICD-10-CM

## 2023-04-17 DIAGNOSIS — R202 Paresthesia of skin: Secondary | ICD-10-CM

## 2023-04-17 DIAGNOSIS — R600 Localized edema: Secondary | ICD-10-CM | POA: Diagnosis not present

## 2023-04-17 DIAGNOSIS — G5622 Lesion of ulnar nerve, left upper limb: Secondary | ICD-10-CM | POA: Insufficient documentation

## 2023-04-17 NOTE — Telephone Encounter (Signed)
Referral sent to [facility], phone # [phone]. 

## 2023-04-17 NOTE — Progress Notes (Signed)
Bilateral lower extremity venous duplex has been completed. Preliminary results can be found in CV Proc through chart review.  Results were faxed to Dr. Signe Colt.  04/17/23 1:26 PM Olen Cordial RVT

## 2023-04-17 NOTE — Progress Notes (Addendum)
Chief Complaint  Patient presents with   Procedure    Rm EMG/NCV 4.      ASSESSMENT AND PLAN  Kyle Merritt is a 61 y.o. 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     DIAGNOSTIC DATA (LABS, IMAGING, TESTING) - I reviewed patient records, labs, notes, testing and imaging myself where available.   MEDICAL HISTORY:  Kyle Merritt is a 61 year old male, seen in request by his primary care physician nurse practitioner Angus Seller for evaluation of hands paresthesia, initial evaluation was on January 10, 2023   I reviewed and summarized the referring note. PMHX. HLD DM  HTN Right knee replacement  He reported gradual onset left hand paresthesia since 2023, mainly involving first 3 fingers, often spread to whole hand, he has to put down his left hand at the bedside to be able to sleep  Recent few months, also noticed similar involvement of right hand, often triggered by neck posturing, such as sleeping in certain position, laying on his left side  He denies gait abnormality, denies bowel bladder incontinence  Has a long history of neck pain, getting worse over the past few months, radiating pain to left shoulder, left arm,  UPDATE April 17 2023: Patient return for electrodiagnostic study today, which showed evidence of bilateral carpal tunnel syndromes, severe on the right side, with evidence of axonal loss, moderate on the left side, mainly demyelinating in nature, there was also evidence of mild left ulnar neuropathy across left elbow.  Personally reviewed MRI of the cervical  spine from February 2024, mild degenerative changes, variable degree of foraminal narrowing, most noticeable at the right C7-T1,   PHYSICAL EXAM:   Vitals:   04/17/23 0829  BP: 128/88  Pulse: 90  Weight: 242 lb (109.8 kg)  Height: 5\' 11"  (1.803 m)     Body mass index is 33.75 kg/m.  PHYSICAL EXAMNIATION:  Gen: NAD, conversant, well nourised, well groomed                     Cardiovascular: Regular rate rhythm, no peripheral edema, warm, nontender. Eyes: Conjunctivae clear without exudates or hemorrhage Neck: Supple, no carotid bruits. Pulmonary: Clear to auscultation bilaterally   NEUROLOGICAL EXAM:  MENTAL STATUS: Speech/cognition: Awake, alert, oriented to history taking and casual conversation CRANIAL NERVES: CN II: Visual fields are full to confrontation. Pupils are round equal and briskly reactive to light. CN III, IV, VI: extraocular movement are normal. No ptosis. CN V: Facial sensation is intact to light touch CN VII: Face is symmetric with normal eye closure  CN VIII: Hearing is normal to causal conversation. CN IX, X: Phonation is normal. CN XI: Head turning and shoulder shrug are intact  MOTOR: There is no pronator drift of out-stretched arms. Muscle bulk and tone are normal. Muscle strength is normal.  REFLEXES: Reflexes are 2+ and symmetric at the biceps, triceps, knees, and ankles. Plantar responses are flexor.  SENSORY: Intact to light touch, pinprick and vibratory sensation are intact in fingers and toes.  With exception of decreased pinprick at fingerpads  COORDINATION: There is no trunk or limb dysmetria noted.  GAIT/STANCE: Posture is normal. Gait is steady with normal steps, base, arm swing, and turning. Heel and toe walking are normal. Tandem gait is normal.  Romberg is absent.  REVIEW OF SYSTEMS:  Full 14 system review of systems performed and notable only for as above All other review of systems were negative.   ALLERGIES: Allergies   Allergen Reactions   Crab [Shellfish Allergy]     itching    HOME MEDICATIONS: Current Outpatient Medications  Medication Sig Dispense Refill   Accu-Chek Softclix Lancets lancets Use as directed twice daily. 100 each 5   atorvastatin (LIPITOR) 40 MG tablet Take 1 tablet (40 mg total) by mouth every evening. 90 tablet 3   blood glucose meter kit and supplies KIT Dispense based on patient and insurance preference. Use up to four times daily as directed. (FOR ICD-10  E11.9). 1 each 0   Blood Pressure Monitor DEVI Use to check blood pressure daily 1 each 0   dorzolamide-timolol (COSOPT) 2-0.5 % ophthalmic solution Place 1 drop into the right eye 2 (two) times daily.     empagliflozin (JARDIANCE) 25 MG TABS tablet Take 1 tablet (25 mg total) by mouth daily before breakfast. 90 tablet 3   gabapentin (NEURONTIN) 300 MG capsule Take 1 capsule (300 mg total) by mouth 3 (three) times daily. 90 capsule 5   glucose blood (ACCU-CHEK GUIDE) test strip Use as instructed twice daily. 100 each 12   lisinopril-hydrochlorothiazide (ZESTORETIC) 20-25 MG tablet Take 1 tablet by mouth daily. 90 tablet 3   metFORMIN (GLUCOPHAGE) 1000 MG tablet Take 1 tablet (1,000 mg total) by mouth 2 (two) times daily with a meal. 180 tablet 3   omeprazole (PRILOSEC) 40 MG capsule Take 1 capsule (40 mg total) by mouth daily. 90 capsule 3   sildenafil (VIAGRA) 100 MG tablet Take 0.5 tablets (50 mg total) by mouth daily as needed for erectile dysfunction. 10 tablet 11   VYZULTA 0.024 % SOLN Apply 1 drop to eye every evening.     No current facility-administered medications for this visit.    PAST MEDICAL HISTORY: Past Medical History:  Diagnosis Date   Arthritis    Colon cancer (HCC)    Diabetes mellitus without complication (HCC)    High cholesterol    History of colon cancer    History of colon cancer 2004   Hypertension     PAST SURGICAL HISTORY: Past Surgical History:  Procedure Laterality Date   COLON SURGERY      KNEE ARTHROSCOPY Bilateral    TOTAL KNEE ARTHROPLASTY Right 11/30/2019   Procedure: RIGHT TOTAL KNEE ARTHROPLASTY;  Surgeon: Gean Birchwood, MD;  Location: [facility];  Service: Orthopedics;  Laterality: Right;   TRIGGER FINGER RELEASE Right 05/22/2019    FAMILY HISTORY: Family History  Problem Relation Age of Onset   Diabetes Mother    Hypertension Mother    Colon cancer Paternal Uncle    Esophageal cancer Neg Hx    Inflammatory bowel disease Neg Hx    Liver disease Neg Hx    Pancreatic cancer Neg Hx    Stomach cancer Neg Hx     SOCIAL HISTORY: Social History   Socioeconomic History   Marital status: Divorced    Spouse name: Not on file   Number of children: Not on file   Years of education: Not on file   Highest education level: Not on file  Occupational History   Not on file  Tobacco Use   Smoking status: Former  Packs/day: .25    Types: Cigarettes    Quit date: 2018    Years since quitting: 6.3   Smokeless tobacco: Never  Vaping Use   Vaping Use: Never used  Substance and Sexual Activity   Alcohol use: No   Drug use: No   Sexual activity: Yes  Other Topics Concern   Not on file  Social History Narrative   Not on file   Social Determinants of Health   Financial Resource Strain: Low Risk  (03/25/2023)   Overall Financial Resource Strain (CARDIA)    Difficulty of Paying Living Expenses: Not hard at all  Food Insecurity: Not on file  Transportation Needs: Not on file  Physical Activity: Not on file  Stress: Not on file  Social Connections: Not on file  Intimate Partner Violence: Not on file      Levert Feinstein, M.D. Ph.D.  [facility] [address] [place], [place] [address] Ph: [phone] Fax: [phone]  CC:  Ivonne Andrew, NP [phone] [address] [place],  [place] [address]  Ivonne Andrew, NP

## 2023-04-17 NOTE — Procedures (Addendum)
Full Name: Kyle Merritt Gender: Male MRN #: [id number] Date of Birth: 1961/05/03    Visit Date: 04/17/2023 08:14 Age: 62 Years Examining Physician: Dr. Levert Feinstein Referring Physician: Dr. Levert Feinstein Height: 5 feet 11 inch History:   62 year old male, history of diabetes, complains of bilateral hands paresthesia, neck stiffness, radiating pain to left shoulder and arm  Summary of the test: Nerve conduction study: Right ulnar sensory response showed mildly prolonged peak latency with mildly decreased snap amplitude.  Left ulnar, bilateral median sensory responses were absent.  Bilateral radial sensory responses were normal  Right median motor responses were absent.  Left median motor responses showed significantly prolonged distal latency, with normal CMAP amplitude.   Right ulnar motor responses were normal  Left ulnar motor responses showed normal distal latency, CMAP amplitude, 50mm/s velocity drop across left elbow.  Electromyography:  Selective needle examinations of bilateral upper extremity muscles, cervical paraspinal muscles were performed.  There is evidence of active neuropathic changes involving right abductor pollicis brevis.  There is also mild chronic neuropathic changes involving left abductor pollicis brevis, left abductor digital minimi,    There was no spontaneous activity at left cervical paraspinal muscles.  Conclusion: This is an abnormal study.  There is electrodiagnostic evidence of bilateral median neuropathy across the wrist, consistent with bilateral carpal tunnel syndromes, right side is severe, with evidence of axonal loss; left side is moderate, demyelinating in nature.  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  There is no evidence of active cervical radiculopathy.    ------------------------------- Levert Feinstein M.D. PhD  [facility] [address] [place], [place]  [address] Tel: [phone] Fax: [phone]  Verbal informed consent was obtained from the patient, patient was informed of potential risk of procedure, including bruising, bleeding, hematoma formation, infection, muscle weakness, muscle pain, numbness, among others.        MNC    Nerve / Sites Muscle Latency Ref. Amplitude Ref. Rel Amp Segments Distance Velocity Ref. Area    ms ms mV mV %  cm m/s m/s mVms  R Median - APB     Wrist APB NR ?4.4 NR ?4.0 NR Wrist - APB 7   NR  L Median - APB     Wrist APB 7.4 ?4.4 7.3 ?4.0 100 Wrist - APB 7   32.6     Upper arm APB 12.7  6.9  95 Upper arm - Wrist 26 49 ?49 32.8  R Ulnar - ADM     Wrist ADM 3.1 ?3.3 9.6 ?6.0 100 Wrist - ADM 7   41.6     B.Elbow ADM 5.4  8.8  91.4 B.Elbow - Wrist 12 52 ?49 38.9     A.Elbow ADM 9.1  9.0  102 A.Elbow - B.Elbow 19 51 ?49 40.2  L Ulnar - ADM     Wrist ADM 3.2 ?3.3 7.8 ?6.0 100 Wrist - ADM 7   25.7     B.Elbow ADM 5.7  6.2  79.7 B.Elbow - Wrist 12 49 ?49 21.3     A.Elbow ADM 11.5  6.2  100 A.Elbow - B.Elbow 21 36 ?49 23.4             SNC    Nerve / Sites Rec. Site Peak Lat Ref.  Amp Ref. Segments Distance    ms ms V V  cm  R Radial - Anatomical snuff box (Forearm)  Forearm Wrist 2.8 ?2.9 23 ?15 Forearm - Wrist 10  L Radial - Anatomical snuff box (Forearm)     Forearm Wrist 2.7 ?2.9 19 ?15 Forearm - Wrist 10  R Median - Orthodromic (Dig II, Mid palm)     Dig II Wrist NR ?3.4 NR ?10 Dig II - Wrist 13  L Median - Orthodromic (Dig II, Mid palm)     Dig II Wrist NR ?3.4 NR ?10 Dig II - Wrist 13  R Ulnar - Orthodromic, (Dig V, Mid palm)     Dig V Wrist 3.9 ?3.1 4 ?5 Dig V - Wrist 11  L Ulnar - Orthodromic, (Dig V, Mid palm)     Dig V Wrist NR ?3.1 NR ?5 Dig V - Wrist 48                 F  Wave    Nerve F Lat Ref.   ms ms  R Ulnar - ADM 32.4 ?32.0  L Ulnar - ADM 34.7 ?32.0         EMG Summary Table    Spontaneous MUAP Recruitment  Muscle IA Fib PSW Fasc Other Amp Dur. Poly Pattern  L. First  dorsal interosseous Normal None None None _______ Normal Normal Normal Normal  L. Abductor pollicis brevis Increased None None None _______ Increased Increased 1+ Reduced  L. Abductor digiti minimi (manus) Increased None None None _______ Increased Increased 1+ Reduced  L. Pronator teres Normal None None None _______ Normal Normal Normal Normal  L. Biceps brachii Normal None None None _______ Normal Normal Normal Normal  L. Deltoid Normal None None None _______ Normal Normal Normal Normal  L. Triceps brachii Normal None None None _______ Normal Normal Normal Normal  L. Extensor digitorum communis Normal None None None _______ Normal Normal Normal Normal  L. Cervical paraspinals Normal None None None _______ Normal Normal Normal Normal  R. First dorsal interosseous Normal None None None _______ Normal Normal Normal Normal  R. Abductor pollicis brevis Increased 1+ 1+ None _______ Increased Increased 1+ Reduced  R. Pronator teres Normal None None None _______ Normal Normal Normal Normal  R. Biceps brachii Normal None None None _______ Normal Normal Normal Normal  R. Deltoid Normal None None None _______ Normal Normal Normal Normal  R. Triceps brachii Normal None None None _______ Normal Normal Normal Normal  R. Extensor digitorum communis Normal None None None _______ Normal Normal Normal Normal  R. Cervical paraspinals Normal None None None _______ Normal Normal Normal Normal  L. Brachioradialis Normal None None None _______ Normal Normal Normal Normal

## 2023-04-22 ENCOUNTER — Encounter: Payer: Self-pay | Admitting: Podiatry

## 2023-04-22 ENCOUNTER — Ambulatory Visit (INDEPENDENT_AMBULATORY_CARE_PROVIDER_SITE_OTHER): Payer: Medicaid Other | Admitting: Podiatry

## 2023-04-22 VITALS — BP 133/87

## 2023-04-22 DIAGNOSIS — E1169 Type 2 diabetes mellitus with other specified complication: Secondary | ICD-10-CM

## 2023-04-22 DIAGNOSIS — G5603 Carpal tunnel syndrome, bilateral upper limbs: Secondary | ICD-10-CM | POA: Diagnosis not present

## 2023-04-22 DIAGNOSIS — G5602 Carpal tunnel syndrome, left upper limb: Secondary | ICD-10-CM | POA: Diagnosis not present

## 2023-04-22 DIAGNOSIS — B351 Tinea unguium: Secondary | ICD-10-CM

## 2023-04-22 DIAGNOSIS — G5622 Lesion of ulnar nerve, left upper limb: Secondary | ICD-10-CM | POA: Diagnosis not present

## 2023-04-22 DIAGNOSIS — G5620 Lesion of ulnar nerve, unspecified upper limb: Secondary | ICD-10-CM | POA: Insufficient documentation

## 2023-04-22 DIAGNOSIS — M79609 Pain in unspecified limb: Secondary | ICD-10-CM

## 2023-04-22 NOTE — Progress Notes (Unsigned)
  Subjective:  Patient ID: Kyle Merritt, male    DOB: 14-Sep-1961,  MRN: [id number]  [name] presents to clinic today for {jgcomplaint:23593}  Chief Complaint  Patient presents with   Nail Problem    [facility] BS-did not check today ALC-7.? PCP-[facility] PCP VST-3 months ago   New problem(s): None. {jgcomplaint:23593}  PCP is Ivonne Andrew, NP.  Allergies  Allergen Reactions   Crab [Shellfish Allergy]     itching   Review of Systems: Negative except as noted in the HPI.  Objective: No changes noted in today's physical examination. There were no vitals filed for this visit. Kyle Merritt is a pleasant 62 y.o. male {jgbodyhabitus:24098} AAO x 3.  Vascular Examination: CFT <3 seconds b/l LE. Palpable DP pulse(s) b/l LE. Palpable PT pulse(s) b/l LE. Pedal hair sparse. No pain with calf compression b/l. Lower extremity skin temperature gradient within normal limits. No edema noted b/l LE. No cyanosis or clubbing noted b/l LE.  Dermatological Examination: Pedal integument with normal turgor, texture and tone b/l LE. No open wounds b/l. No interdigital macerations b/l. Toenails 1-5 b/l elongated, thickened, discolored with subungual debris. +Tenderness with dorsal palpation of nailplates. No hyperkeratotic or porokeratotic lesions present.  Neurological Examination: Protective sensation intact 5/5 intact bilaterally with 10g monofilament b/l. Vibratory sensation intact b/l. Proprioception intact bilaterally.  Musculoskeletal Examination: Muscle strength 5/5 to all lower extremity muscle groups bilaterally. No pain, crepitus or joint limitation noted with ROM bilateral LE. No gross bony deformities bilaterally. Patient ambulates independent of any assistive aids.  Assessment/Plan: No diagnosis found.  No orders of the defined types were placed in this encounter.   None {Jgplan:23602::"-Patient/POA to call should there be question/concern in the interim."}   Return in about 3  months (around 07/22/2023).  Freddie Breech, DPM

## 2023-04-24 ENCOUNTER — Encounter: Payer: Self-pay | Admitting: Nurse Practitioner

## 2023-04-24 ENCOUNTER — Ambulatory Visit: Payer: Medicaid Other | Admitting: Nurse Practitioner

## 2023-04-24 VITALS — BP 106/68 | HR 88 | Temp 97.3°F | Ht 72.0 in | Wt 235.0 lb

## 2023-04-24 DIAGNOSIS — K5900 Constipation, unspecified: Secondary | ICD-10-CM | POA: Diagnosis not present

## 2023-04-24 DIAGNOSIS — I10 Essential (primary) hypertension: Secondary | ICD-10-CM

## 2023-04-24 DIAGNOSIS — K219 Gastro-esophageal reflux disease without esophagitis: Secondary | ICD-10-CM | POA: Diagnosis not present

## 2023-04-24 DIAGNOSIS — E785 Hyperlipidemia, unspecified: Secondary | ICD-10-CM | POA: Diagnosis not present

## 2023-04-24 DIAGNOSIS — E1169 Type 2 diabetes mellitus with other specified complication: Secondary | ICD-10-CM

## 2023-04-24 LAB — POCT GLYCOSYLATED HEMOGLOBIN (HGB A1C): Hemoglobin A1C: 8.3 % — AB (ref 4.0–5.6)

## 2023-04-24 MED ORDER — DOCUSATE SODIUM 100 MG PO CAPS: 100.0000 mg | ORAL_CAPSULE | Freq: Two times a day (BID) | ORAL | 0 refills | Status: DC

## 2023-04-24 MED ORDER — GABAPENTIN 300 MG PO CAPS: 300.0000 mg | ORAL_CAPSULE | Freq: Three times a day (TID) | ORAL | 5 refills | Status: DC

## 2023-04-24 MED ORDER — EMPAGLIFLOZIN 25 MG PO TABS: 25.0000 mg | ORAL_TABLET | Freq: Every day | ORAL | 3 refills | Status: DC

## 2023-04-24 MED ORDER — LISINOPRIL-HYDROCHLOROTHIAZIDE 20-25 MG PO TABS: 1.0000 | ORAL_TABLET | Freq: Every day | ORAL | 3 refills | Status: DC

## 2023-04-24 MED ORDER — ATORVASTATIN CALCIUM 40 MG PO TABS: 40.0000 mg | ORAL_TABLET | Freq: Every evening | ORAL | 3 refills | Status: DC

## 2023-04-24 MED ORDER — METFORMIN HCL 1000 MG PO TABS: 1000.0000 mg | ORAL_TABLET | Freq: Two times a day (BID) | ORAL | 3 refills | Status: DC

## 2023-04-24 MED ORDER — OMEPRAZOLE 40 MG PO CPDR: 40.0000 mg | DELAYED_RELEASE_CAPSULE | Freq: Every day | ORAL | 3 refills | Status: DC

## 2023-04-24 NOTE — Patient Instructions (Addendum)
1. Type 2 diabetes mellitus with other specified complication, without long-term current use of insulin (HCC)  - POCT glycosylated hemoglobin (Hb A1C) - metFORMIN (GLUCOPHAGE) 1000 MG tablet; Take 1 tablet (1,000 mg total) by mouth 2 (two) times daily with a meal.  Dispense: 180 tablet; Refill: 3 - CBC - Comprehensive metabolic panel  Lab Results  Component Value Date   HGBA1C 8.3 (A) 04/24/2023     2. Gastroesophageal reflux disease without esophagitis  - omeprazole (PRILOSEC) 40 MG capsule; Take 1 capsule (40 mg total) by mouth daily.  Dispense: 90 capsule; Refill: 3  3. Hypertension, unspecified type  - lisinopril-hydrochlorothiazide (ZESTORETIC) 20-25 MG tablet; Take 1 tablet by mouth daily.  Dispense: 90 tablet; Refill: 3  4. Hyperlipidemia, unspecified hyperlipidemia type  - atorvastatin (LIPITOR) 40 MG tablet; Take 1 tablet (40 mg total) by mouth every evening.  Dispense: 90 tablet; Refill: 3  5. Constipation, unspecified constipation type  - docusate sodium (COLACE) 100 MG capsule; Take 1 capsule (100 mg total) by mouth 2 (two) times daily.  Dispense: 10 capsule; Refill: 0  Follow up:  Follow up in 3 months

## 2023-04-24 NOTE — Assessment & Plan Note (Signed)
-   POCT glycosylated hemoglobin (Hb A1C) - metFORMIN (GLUCOPHAGE) 1000 MG tablet; Take 1 tablet (1,000 mg total) by mouth 2 (two) times daily with a meal.  Dispense: 180 tablet; Refill: 3 - CBC - Comprehensive metabolic panel  Lab Results  Component Value Date   HGBA1C 8.3 (A) 04/24/2023     2. Gastroesophageal reflux disease without esophagitis  - omeprazole (PRILOSEC) 40 MG capsule; Take 1 capsule (40 mg total) by mouth daily.  Dispense: 90 capsule; Refill: 3  3. Hypertension, unspecified type  - lisinopril-hydrochlorothiazide (ZESTORETIC) 20-25 MG tablet; Take 1 tablet by mouth daily.  Dispense: 90 tablet; Refill: 3  4. Hyperlipidemia, unspecified hyperlipidemia type  - atorvastatin (LIPITOR) 40 MG tablet; Take 1 tablet (40 mg total) by mouth every evening.  Dispense: 90 tablet; Refill: 3  5. Constipation, unspecified constipation type  - docusate sodium (COLACE) 100 MG capsule; Take 1 capsule (100 mg total) by mouth 2 (two) times daily.  Dispense: 10 capsule; Refill: 0  Follow up:  Follow up in 3 months

## 2023-04-24 NOTE — Progress Notes (Signed)
@Patient  ID: Kyle Merritt, male    DOB: December 06, 1961, 62 y.o.   MRN: [id number]  Chief Complaint  Patient presents with   Hypertension   Diabetes    Referring provider: Ivonne Andrew, NP   HPI  Kyle Merritt presents for follow up. He  has a past medical history of Arthritis, Colon cancer (HCC), Diabetes mellitus without complication (HCC), High cholesterol, History of colon cancer, History of colon cancer (2004), and Hypertension.      Diabetes Mellitus Patient presents for follow up of diabetes. Current symptoms include: hyperglycemia and weight gain. Patient denies foot ulcerations, hypoglycemia , increased appetite, nausea, paresthesia of the feet, polydipsia and polyuria. Evaluation to date has included: fasting blood sugar, fasting lipid panel, hemoglobin A1C and microalbuminuria.  Home sugars: not checking. Current treatment: more intensive attention to diet which has not been effective, Continued metformin which has been effective, Continued statin which has been effective and Continued ACE inhibitor/ARB which has been effective. A1C is 8.3 today.    Patient is not checking home blood sugars.   Home blood sugar records: patient does not check sugars How often is blood sugars being checked:  Current symptoms/problems include none and have been worsening.        Allergies  Allergen Reactions   Crab [Shellfish Allergy]     itching    Immunization History  Administered Date(s) Administered   Influenza Whole 12/08/2008, 02/03/2010   Influenza,inj,Quad PF,6+ Mos 10/17/2018, 08/26/2019, 12/12/2020, 10/19/2022   Pneumococcal Polysaccharide-23 03/24/2009, 01/29/2018   Td 12/08/2008   Tdap 01/29/2018    Past Medical History:  Diagnosis Date   Arthritis    Colon cancer (HCC)    Diabetes mellitus without complication (HCC)    High cholesterol    History of colon cancer    History of colon cancer 2004   Hypertension     Tobacco History: Social History   Tobacco  Use  Smoking Status Former   Packs/day: .25   Types: Cigarettes   Quit date: 2018   Years since quitting: 6.3  Smokeless Tobacco Never   Counseling given: Not Answered   Outpatient Encounter Medications as of 04/24/2023  Medication Sig   Accu-Chek Softclix Lancets lancets Use as directed twice daily.   blood glucose meter kit and supplies KIT Dispense based on patient and insurance preference. Use up to four times daily as directed. (FOR ICD-10  E11.9).   Blood Pressure Monitor DEVI Use to check blood pressure daily   docusate sodium (COLACE) 100 MG capsule Take 1 capsule (100 mg total) by mouth 2 (two) times daily.   dorzolamide-timolol (COSOPT) 2-0.5 % ophthalmic solution Place 1 drop into the right eye 2 (two) times daily.   glucose blood (ACCU-CHEK GUIDE) test strip Use as instructed twice daily.   sildenafil (VIAGRA) 100 MG tablet Take 0.5 tablets (50 mg total) by mouth daily as needed for erectile dysfunction.   VYZULTA 0.024 % SOLN Apply 1 drop to eye every evening.   [DISCONTINUED] atorvastatin (LIPITOR) 40 MG tablet Take 1 tablet (40 mg total) by mouth every evening.   [DISCONTINUED] empagliflozin (JARDIANCE) 25 MG TABS tablet Take 1 tablet (25 mg total) by mouth daily before breakfast.   [DISCONTINUED] gabapentin (NEURONTIN) 300 MG capsule Take 1 capsule (300 mg total) by mouth 3 (three) times daily.   [DISCONTINUED] lisinopril-hydrochlorothiazide (ZESTORETIC) 20-25 MG tablet Take 1 tablet by mouth daily.   [DISCONTINUED] metFORMIN (GLUCOPHAGE) 1000 MG tablet Take 1 tablet (1,000 mg total) by mouth 2 (  two) times daily with a meal.   [DISCONTINUED] omeprazole (PRILOSEC) 40 MG capsule Take 1 capsule (40 mg total) by mouth daily.   atorvastatin (LIPITOR) 40 MG tablet Take 1 tablet (40 mg total) by mouth every evening.   empagliflozin (JARDIANCE) 25 MG TABS tablet Take 1 tablet (25 mg total) by mouth daily before breakfast.   gabapentin (NEURONTIN) 300 MG capsule Take 1 capsule (300  mg total) by mouth 3 (three) times daily.   lisinopril-hydrochlorothiazide (ZESTORETIC) 20-25 MG tablet Take 1 tablet by mouth daily.   metFORMIN (GLUCOPHAGE) 1000 MG tablet Take 1 tablet (1,000 mg total) by mouth 2 (two) times daily with a meal.   omeprazole (PRILOSEC) 40 MG capsule Take 1 capsule (40 mg total) by mouth daily.   No facility-administered encounter medications on file as of 04/24/2023.     Review of Systems  Review of Systems  Constitutional: Negative.   HENT: Negative.    Cardiovascular: Negative.   Gastrointestinal: Negative.   Allergic/Immunologic: Negative.   Neurological: Negative.   Psychiatric/Behavioral: Negative.         Physical Exam  BP 106/68   Pulse 88   Temp (!) 97.3 F (36.3 C)   Ht 6' (1.829 m)   Wt 235 lb (106.6 kg)   SpO2 100%   BMI 31.87 kg/m   Wt Readings from Last 5 Encounters:  04/24/23 235 lb (106.6 kg)  04/17/23 242 lb (109.8 kg)  01/21/23 239 lb 6.4 oz (108.6 kg)  01/10/23 242 lb (109.8 kg)  10/19/22 240 lb (108.9 kg)     Physical Exam Vitals and nursing note reviewed.  Constitutional:      General: He is not in acute distress.    Appearance: He is well-developed.  Cardiovascular:     Rate and Rhythm: Normal rate and regular rhythm.  Pulmonary:     Effort: Pulmonary effort is normal.     Breath sounds: Normal breath sounds.  Skin:    General: Skin is warm and dry.  Neurological:     Mental Status: He is alert and oriented to person, place, and time.      Lab Results:  CBC    Component Value Date/Time   WBC 7.8 01/21/2023 1008   WBC 8.0 01/11/2020 2017   RBC 4.91 01/21/2023 1008   RBC 3.65 (L) 01/11/2020 2017   HGB 13.1 01/21/2023 1008   HCT 39.8 01/21/2023 1008   PLT 311 01/21/2023 1008   MCV 81 01/21/2023 1008   MCH 26.7 01/21/2023 1008   MCH 24.4 (L) 01/11/2020 2017   MCHC 32.9 01/21/2023 1008   MCHC 31.0 01/11/2020 2017   RDW 13.6 01/21/2023 1008   LYMPHSABS 2.9 10/13/2020 1507   MONOABS 0.5  11/26/2019 0952   EOSABS 0.2 10/13/2020 1507   BASOSABS 0.1 10/13/2020 1507    BMET    Component Value Date/Time   NA 139 01/21/2023 1008   K 4.3 01/21/2023 1008   CL 98 01/21/2023 1008   CO2 24 01/21/2023 1008   GLUCOSE 168 (H) 01/21/2023 1008   GLUCOSE 234 (H) 01/11/2020 2017   BUN 32 (H) 01/21/2023 1008   CREATININE 1.65 (H) 01/21/2023 1008   CREATININE 1.64 (H) 08/02/2014 1151   CALCIUM 9.8 01/21/2023 1008   GFRNONAA 40 (L) 10/13/2020 1507   GFRAA 46 (L) 10/13/2020 1507    BNP No results found for: "BNP"  ProBNP No results found for: "PROBNP"  Imaging: VAS [place] LOWER EXTREMITY VENOUS (DVT)  Result Date: 04/17/2023  Lower Venous DVT Study Patient Name:  SHRIYAN ARAKAWA  Date of Exam:   04/17/2023 Medical Rec #: [id number]     Accession #:    [id number] Date of Birth: 14-Oct-1961     Patient Gender: M Patient Age:   56 years Exam Location:  [facility] Procedure:      VAS [place] LOWER EXTREMITY VENOUS (DVT) Referring Phys: Lanora Manis [place] --------------------------------------------------------------------------------  Indications: Edema.  Risk Factors: None identified. Comparison Study: No prior studies. Performing Technologist: Chanda Busing RVT  Examination Guidelines: A complete evaluation includes B-mode imaging, spectral Doppler, color Doppler, and power Doppler as needed of all accessible portions of each vessel. Bilateral testing is considered an integral part of a complete examination. Limited examinations for reoccurring indications may be performed as noted. The reflux portion of the exam is performed with the patient in reverse Trendelenburg.  +---------+---------------+---------+-----------+----------+--------------+ RIGHT    CompressibilityPhasicitySpontaneityPropertiesThrombus Aging +---------+---------------+---------+-----------+----------+--------------+ CFV      Full           Yes      Yes                                  +---------+---------------+---------+-----------+----------+--------------+ SFJ      Full                                                        +---------+---------------+---------+-----------+----------+--------------+ FV Prox  Full                                                        +---------+---------------+---------+-----------+----------+--------------+ FV Mid   Full                                                        +---------+---------------+---------+-----------+----------+--------------+ FV DistalFull                                                        +---------+---------------+---------+-----------+----------+--------------+ PFV      Full                                                        +---------+---------------+---------+-----------+----------+--------------+ POP      Full           Yes      Yes                                 +---------+---------------+---------+-----------+----------+--------------+ PTV      Full                                                        +---------+---------------+---------+-----------+----------+--------------+  PERO     Full                                                        +---------+---------------+---------+-----------+----------+--------------+   +---------+---------------+---------+-----------+----------+--------------+ LEFT     CompressibilityPhasicitySpontaneityPropertiesThrombus Aging +---------+---------------+---------+-----------+----------+--------------+ CFV      Full           Yes      Yes                                 +---------+---------------+---------+-----------+----------+--------------+ SFJ      Full                                                        +---------+---------------+---------+-----------+----------+--------------+ FV Prox  Full                                                         +---------+---------------+---------+-----------+----------+--------------+ FV Mid   Full                                                        +---------+---------------+---------+-----------+----------+--------------+ FV DistalFull                                                        +---------+---------------+---------+-----------+----------+--------------+ PFV      Full                                                        +---------+---------------+---------+-----------+----------+--------------+ POP      Full           Yes      Yes                                 +---------+---------------+---------+-----------+----------+--------------+ PTV      Full                                                        +---------+---------------+---------+-----------+----------+--------------+ PERO     Full                                                        +---------+---------------+---------+-----------+----------+--------------+  Summary: RIGHT: - There is no evidence of deep vein thrombosis in the lower extremity.  - No cystic structure found in the popliteal fossa.  LEFT: - There is no evidence of deep vein thrombosis in the lower extremity.  - No cystic structure found in the popliteal fossa.  *See table(s) above for measurements and observations. Electronically signed by Sherald Hess MD on 04/17/2023 at 4:23:32 PM.    Final    NCV with EMG(electromyography)  Result Date: 04/17/2023 Levert Feinstein, MD     04/17/2023 10:01 AM     Full Name: Kyle Merritt Gender: Male MRN #: [id number] Date of Birth: [birth year]   Visit Date: 04/17/2023 08:14 Age: 59 Years Examining Physician: Dr. Levert Feinstein Referring Physician: Dr. Levert Feinstein Height: 5 feet 11 inch History:   62 year old male, history of diabetes, complains of bilateral hands paresthesia, neck stiffness, radiating pain to left shoulder and arm Summary of the test: Nerve conduction study: Right ulnar sensory response  showed mildly prolonged peak latency with mildly decreased snap amplitude. Left ulnar, bilateral median sensory responses were absent. Bilateral radial sensory responses were normal Right median motor responses were absent. Left median motor responses showed significantly prolonged distal latency, with normal CMAP amplitude. Right ulnar motor responses were normal Left ulnar motor responses showed normal distal latency, CMAP amplitude, 64mm/s velocity drop across left elbow. Electromyography: Selective needle examinations of bilateral upper extremity muscles, cervical paraspinal muscles were performed. There is evidence of active neuropathic changes involving right abductor pollicis brevis. There is also mild chronic neuropathic changes involving left abductor pollicis brevis, left abductor digital minimi,  There was no spontaneous activity at left cervical paraspinal muscles. Conclusion: This is an abnormal study.  There is electrodiagnostic evidence of bilateral median neuropathy across the wrist, consistent with bilateral carpal tunnel syndromes, right side is severe, with evidence of axonal loss; left side is moderate, demyelinating in nature.  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  There is no evidence of active cervical radiculopathy. ------------------------------- Levert Feinstein M.D. PhD [facility] [address], [address] [place], [place] [address] Tel: [phone] Fax: [phone] Verbal informed consent was obtained from the patient, patient was informed of potential risk of procedure, including bruising, bleeding, hematoma formation, infection, muscle weakness, muscle pain, numbness, among others.     MNC   Nerve / Sites Muscle Latency Ref. Amplitude Ref. Rel Amp Segments Distance Velocity Ref. Area   ms ms mV mV %  cm m/s m/s mVms R Median - APB    Wrist APB NR ?4.4 NR ?4.0 NR Wrist - APB 7   NR L Median - APB    Wrist APB 7.4 ?4.4 7.3 ?4.0 100 Wrist  - APB 7   32.6    Upper arm APB 12.7  6.9  95 Upper arm - Wrist 26 49 ?49 32.8 R Ulnar - ADM    Wrist ADM 3.1 ?3.3 9.6 ?6.0 100 Wrist - ADM 7   41.6    B.Elbow ADM 5.4  8.8  91.4 B.Elbow - Wrist 12 52 ?49 38.9    A.Elbow ADM 9.1  9.0  102 A.Elbow - B.Elbow 19 51 ?49 40.2 L Ulnar - ADM    Wrist ADM 3.2 ?3.3 7.8 ?6.0 100 Wrist - ADM 7   25.7    B.Elbow ADM 5.7  6.2  79.7 B.Elbow - Wrist 12 49 ?49 21.3    A.Elbow ADM 11.5  6.2  100 A.Elbow - B.Elbow  21 36 ?49 23.4           SNC   Nerve / Sites Rec. Site Peak Lat Ref.  Amp Ref. Segments Distance   ms ms V V  cm R Radial - Anatomical snuff box (Forearm)    Forearm Wrist 2.8 ?2.9 23 ?15 Forearm - Wrist 10 L Radial - Anatomical snuff box (Forearm)    Forearm Wrist 2.7 ?2.9 19 ?15 Forearm - Wrist 10 R Median - Orthodromic (Dig II, Mid palm)    Dig II Wrist NR ?3.4 NR ?10 Dig II - Wrist 13 L Median - Orthodromic (Dig II, Mid palm)    Dig II Wrist NR ?3.4 NR ?10 Dig II - Wrist 13 R Ulnar - Orthodromic, (Dig V, Mid palm)    Dig V Wrist 3.9 ?3.1 4 ?5 Dig V - Wrist 11 L Ulnar - Orthodromic, (Dig V, Mid palm)    Dig V Wrist NR ?3.1 NR ?5 Dig V - Wrist 37               F  Wave   Nerve F Lat Ref.  ms ms R Ulnar - ADM 32.4 ?32.0 L Ulnar - ADM 34.7 ?32.0       EMG Summary Table   Spontaneous MUAP Recruitment Muscle IA Fib PSW Fasc Other Amp Dur. Poly Pattern L. First dorsal interosseous Normal None None None _______ Normal Normal Normal Normal L. Abductor pollicis brevis Increased None None None _______ Increased Increased 1+ Reduced L. Abductor digiti minimi (manus) Increased None None None _______ Increased Increased 1+ Reduced L. Pronator teres Normal None None None _______ Normal Normal Normal Normal L. Biceps brachii Normal None None None _______ Normal Normal Normal Normal L. Deltoid Normal None None None _______ Normal Normal Normal Normal L. Triceps brachii Normal None None None _______ Normal Normal Normal Normal L. Extensor digitorum communis Normal None None None _______  Normal Normal Normal Normal L. Cervical paraspinals Normal None None None _______ Normal Normal Normal Normal R. First dorsal interosseous Normal None None None _______ Normal Normal Normal Normal R. Abductor pollicis brevis Increased 1+ 1+ None _______ Increased Increased 1+ Reduced R. Pronator teres Normal None None None _______ Normal Normal Normal Normal R. Biceps brachii Normal None None None _______ Normal Normal Normal Normal R. Deltoid Normal None None None _______ Normal Normal Normal Normal R. Triceps brachii Normal None None None _______ Normal Normal Normal Normal R. Extensor digitorum communis Normal None None None _______ Normal Normal Normal Normal R. Cervical paraspinals Normal None None None _______ Normal Normal Normal Normal L. Brachioradialis Normal None None None _______ Normal Normal Normal Normal      Assessment & Plan:   Diabetes (HCC) - POCT glycosylated hemoglobin (Hb A1C) - metFORMIN (GLUCOPHAGE) 1000 MG tablet; Take 1 tablet (1,000 mg total) by mouth 2 (two) times daily with a meal.  Dispense: 180 tablet; Refill: 3 - CBC - Comprehensive metabolic panel  Lab Results  Component Value Date   HGBA1C 8.3 (A) 04/24/2023     2. Gastroesophageal reflux disease without esophagitis  - omeprazole (PRILOSEC) 40 MG capsule; Take 1 capsule (40 mg total) by mouth daily.  Dispense: 90 capsule; Refill: 3  3. Hypertension, unspecified type  - lisinopril-hydrochlorothiazide (ZESTORETIC) 20-25 MG tablet; Take 1 tablet by mouth daily.  Dispense: 90 tablet; Refill: 3  4. Hyperlipidemia, unspecified hyperlipidemia type  - atorvastatin (LIPITOR) 40 MG tablet; Take 1 tablet (40 mg total) by mouth every evening.  Dispense: 90  tablet; Refill: 3  5. Constipation, unspecified constipation type  - docusate sodium (COLACE) 100 MG capsule; Take 1 capsule (100 mg total) by mouth 2 (two) times daily.  Dispense: 10 capsule; Refill: 0  Follow up:  Follow up in 3 months     Ivonne Andrew, NP 04/24/2023

## 2023-04-25 ENCOUNTER — Encounter: Payer: Self-pay | Admitting: Nurse Practitioner

## 2023-04-25 ENCOUNTER — Ambulatory Visit: Payer: Medicaid Other | Attending: Nurse Practitioner | Admitting: Nurse Practitioner

## 2023-04-25 VITALS — BP 112/74 | HR 89 | Ht 72.0 in | Wt 236.4 lb

## 2023-04-25 DIAGNOSIS — E119 Type 2 diabetes mellitus without complications: Secondary | ICD-10-CM

## 2023-04-25 DIAGNOSIS — I493 Ventricular premature depolarization: Secondary | ICD-10-CM | POA: Diagnosis not present

## 2023-04-25 DIAGNOSIS — E782 Mixed hyperlipidemia: Secondary | ICD-10-CM | POA: Diagnosis not present

## 2023-04-25 DIAGNOSIS — I10 Essential (primary) hypertension: Secondary | ICD-10-CM | POA: Diagnosis not present

## 2023-04-25 LAB — COMPREHENSIVE METABOLIC PANEL
ALT: 16 IU/L (ref 0–44)
AST: 15 IU/L (ref 0–40)
Albumin/Globulin Ratio: 1.6 (ref 1.2–2.2)
Albumin: 4.4 g/dL (ref 3.9–4.9)
Alkaline Phosphatase: 136 IU/L — ABNORMAL HIGH (ref 44–121)
BUN/Creatinine Ratio: 19 (ref 10–24)
BUN: 32 mg/dL — ABNORMAL HIGH (ref 8–27)
Bilirubin Total: 0.4 mg/dL (ref 0.0–1.2)
CO2: 22 mmol/L (ref 20–29)
Calcium: 9.7 mg/dL (ref 8.6–10.2)
Chloride: 99 mmol/L (ref 96–106)
Creatinine, Ser: 1.72 mg/dL — ABNORMAL HIGH (ref 0.76–1.27)
Globulin, Total: 2.7 g/dL (ref 1.5–4.5)
Glucose: 133 mg/dL — ABNORMAL HIGH (ref 70–99)
Potassium: 4.3 mmol/L (ref 3.5–5.2)
Sodium: 139 mmol/L (ref 134–144)
Total Protein: 7.1 g/dL (ref 6.0–8.5)
eGFR: 44 mL/min/{1.73_m2} — ABNORMAL LOW (ref >59)

## 2023-04-25 LAB — CBC
Hematocrit: 38.7 % (ref 37.5–51.0)
Hemoglobin: 12.2 g/dL — ABNORMAL LOW (ref 13.0–17.7)
MCH: 25.7 pg — ABNORMAL LOW (ref 26.6–33.0)
MCHC: 31.5 g/dL (ref 31.5–35.7)
MCV: 82 fL (ref 79–97)
Platelets: 334 10*3/uL (ref 150–450)
RBC: 4.75 x10E6/uL (ref 4.14–5.80)
RDW: 14.1 % (ref 11.6–15.4)
WBC: 7.5 10*3/uL (ref 3.4–10.8)

## 2023-04-25 NOTE — Progress Notes (Signed)
Office Visit    Patient Name: Kyle Merritt Date of Encounter: 04/25/2023  Primary Care Provider:  Ivonne Andrew, NP Primary Cardiologist:  Chrystie Nose, MD  Chief Complaint    62 year old male with a history of frequent PVCs (bigeminy), hypertension, hyperlipidemia, type 2 diabetes and colon cancer who presents for follow-up related to hypertension.   Past Medical History    Past Medical History:  Diagnosis Date   Arthritis    Colon cancer (HCC)    Diabetes mellitus without complication (HCC)    High cholesterol    History of colon cancer    History of colon cancer 2004   Hypertension    Past Surgical History:  Procedure Laterality Date   COLON SURGERY     KNEE ARTHROSCOPY Bilateral    TOTAL KNEE ARTHROPLASTY Right 11/30/2019   Procedure: RIGHT TOTAL KNEE ARTHROPLASTY;  Surgeon: Gean Birchwood, MD;  Location: [facility];  Service: Orthopedics;  Laterality: Right;   TRIGGER FINGER RELEASE Right 05/22/2019    Allergies  Allergies  Allergen Reactions   Crab [Shellfish Allergy]     itching     Labs/Other Studies Reviewed    The following studies were reviewed today: None  Recent Labs: 04/24/2023: ALT 16; BUN 32; Creatinine, Ser 1.72; Hemoglobin 12.2; Platelets 334; Potassium 4.3; Sodium 139  Recent Lipid Panel    Component Value Date/Time   CHOL 159 01/21/2023 1008   TRIG 140 01/21/2023 1008   HDL 46 01/21/2023 1008   CHOLHDL 3.5 01/21/2023 1008   CHOLHDL 4.0 08/02/2014 1151   VLDL 33 08/02/2014 1151   LDLCALC 88 01/21/2023 1008    History of Present Illness    62 year old male with the above past medical history including frequent PVCs (bigeminy), hypertension, hyperlipidemia, type 2 diabetes and colon cancer.   He was initially referred to Dr. Rennis Golden for the evaluation of irregular heart rhythm.  EKG at the time showed sinus rhythm with PVCs of bigeminy.  He denied any associated symptoms.  Echocardiogram was recommended but not performed due to difficulty  scheduling.  He saw his PCP in September 2023 and reported numbness and tingling in his left arm, he was referred to neurology. He was last seen in the office on 09/27/2022 and was stable from a cardiac standpoint. BP was slightly elevated.    He presents today for follow-up. Since his last visit he has done well from a cardiac standpoint.  He denies any palpitations, dizziness, denies symptoms concerning for angina.  He is working on getting his A1c down.  Overall, he reports feeling well.  Home Medications    Current Outpatient Medications  Medication Sig Dispense Refill   Accu-Chek Softclix Lancets lancets Use as directed twice daily. 100 each 5   atorvastatin (LIPITOR) 40 MG tablet Take 1 tablet (40 mg total) by mouth every evening. 90 tablet 3   blood glucose meter kit and supplies KIT Dispense based on patient and insurance preference. Use up to four times daily as directed. (FOR ICD-10  E11.9). 1 each 0   Blood Pressure Monitor DEVI Use to check blood pressure daily 1 each 0   docusate sodium (COLACE) 100 MG capsule Take 1 capsule (100 mg total) by mouth 2 (two) times daily. 10 capsule 0   dorzolamide-timolol (COSOPT) 2-0.5 % ophthalmic solution Place 1 drop into the right eye 2 (two) times daily.     empagliflozin (JARDIANCE) 25 MG TABS tablet Take 1 tablet (25 mg total) by mouth daily before breakfast.  90 tablet 3   gabapentin (NEURONTIN) 300 MG capsule Take 1 capsule (300 mg total) by mouth 3 (three) times daily. 90 capsule 5   glucose blood (ACCU-CHEK GUIDE) test strip Use as instructed twice daily. 100 each 12   lisinopril-hydrochlorothiazide (ZESTORETIC) 20-25 MG tablet Take 1 tablet by mouth daily. 90 tablet 3   metFORMIN (GLUCOPHAGE) 1000 MG tablet Take 1 tablet (1,000 mg total) by mouth 2 (two) times daily with a meal. 180 tablet 3   omeprazole (PRILOSEC) 40 MG capsule Take 1 capsule (40 mg total) by mouth daily. 90 capsule 3   sildenafil (VIAGRA) 100 MG tablet Take 0.5 tablets (50  mg total) by mouth daily as needed for erectile dysfunction. 10 tablet 11   VYZULTA 0.024 % SOLN Apply 1 drop to eye every evening.     No current facility-administered medications for this visit.     Review of Systems    He denies chest pain, palpitations, dyspnea, pnd, orthopnea, n, v, dizziness, syncope, edema, weight gain, or early satiety. All other systems reviewed and are otherwise negative except as noted above.   Physical Exam    VS:  BP 112/74 (BP Location: Left Arm, Patient Position: Sitting, Cuff Size: Normal)   Pulse 89   Ht 6' (1.829 m)   Wt 236 lb 6.4 oz (107.2 kg)   SpO2 95%   BMI 32.06 kg/m   GEN: Well nourished, well developed, in no acute distress. HEENT: normal. Neck: Supple, no JVD, carotid bruits, or masses. Cardiac: RRR, no murmurs, rubs, or gallops. No clubbing, cyanosis, edema.  Radials/DP/PT 2+ and equal bilaterally.  Respiratory:  Respirations regular and unlabored, clear to auscultation bilaterally. GI: Soft, nontender, nondistended, BS + x 4. MS: no deformity or atrophy. Skin: warm and dry, no rash. Neuro:  Strength and sensation are intact. Psych: Normal affect.  Accessory Clinical Findings    ECG personally reviewed by me today - No EKG in office today.     Lab Results  Component Value Date   WBC 7.5 04/24/2023   HGB 12.2 (L) 04/24/2023   HCT 38.7 04/24/2023   MCV 82 04/24/2023   PLT 334 04/24/2023   Lab Results  Component Value Date   CREATININE 1.72 (H) 04/24/2023   BUN 32 (H) 04/24/2023   NA 139 04/24/2023   K 4.3 04/24/2023   CL 99 04/24/2023   CO2 22 04/24/2023   Lab Results  Component Value Date   ALT 16 04/24/2023   AST 15 04/24/2023   ALKPHOS 136 (H) 04/24/2023   BILITOT 0.4 04/24/2023   Lab Results  Component Value Date   CHOL 159 01/21/2023   HDL 46 01/21/2023   LDLCALC 88 01/21/2023   TRIG 140 01/21/2023   CHOLHDL 3.5 01/21/2023    Lab Results  Component Value Date   HGBA1C 8.3 (A) 04/24/2023     Assessment & Plan    1. 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 He denies any recent palpitations. Stable.   2. Hypertension: BP well controlled. Continue current antihypertensive regimen. .   3. Hyperlipidemia: LDL was 88 in 12/2022.  Monitored and managed per PCP.  Continue Lipitor.   4. Type 2 diabetes: A1c was 8.3 in 03/2023.  Monitored and managed per PCP.   5. Disposition: Follow-up in 6 months.      Joylene Grapes, NP 04/25/2023, 1:32 PM

## 2023-04-25 NOTE — Patient Instructions (Signed)
Medication Instructions:  Your physician recommends that you continue on your current medications as directed. Please refer to the Current Medication list given to you today.   *If you need a refill on your cardiac medications before your next appointment, please call your pharmacy*   Lab Work: NONE ordered at this time of appointment   If you have labs (blood work) drawn today and your tests are completely normal, you will receive your results only by: MyChart Message (if you have MyChart) OR A paper copy in the mail If you have any lab test that is abnormal or we need to change your treatment, we will call you to review the results.   Testing/Procedures: NONE ordered at this time of appointment    Follow-Up: At [facility], you and your health needs are our priority.  As part of our continuing mission to provide you with exceptional heart care, we have created designated Provider Care Teams.  These Care Teams include your primary Cardiologist (physician) and Advanced Practice Providers (APPs -  Physician Assistants and Nurse Practitioners) who all work together to provide you with the care you need, when you need it.  We recommend signing up for the patient portal called "MyChart".  Sign up information is provided on this After Visit Summary.  MyChart is used to connect with patients for Virtual Visits (Telemedicine).  Patients are able to view lab/test results, encounter notes, upcoming appointments, etc.  Non-urgent messages can be sent to your provider as well.   To learn more about what you can do with MyChart, go to [email or web address].    Your next appointment:   6 month(s)  Provider:   Kenneth C Hilty, MD     Other Instructions   

## 2023-04-29 ENCOUNTER — Ambulatory Visit: Payer: Medicaid Other | Admitting: Nurse Practitioner

## 2023-04-29 ENCOUNTER — Encounter: Payer: Self-pay | Admitting: Nurse Practitioner

## 2023-04-29 VITALS — BP 120/78 | HR 87 | Ht 72.0 in | Wt 235.8 lb

## 2023-04-29 DIAGNOSIS — Z85038 Personal history of other malignant neoplasm of large intestine: Secondary | ICD-10-CM | POA: Diagnosis not present

## 2023-04-29 DIAGNOSIS — K219 Gastro-esophageal reflux disease without esophagitis: Secondary | ICD-10-CM

## 2023-04-29 DIAGNOSIS — R143 Flatulence: Secondary | ICD-10-CM

## 2023-04-29 DIAGNOSIS — K5900 Constipation, unspecified: Secondary | ICD-10-CM | POA: Diagnosis not present

## 2023-04-29 DIAGNOSIS — R142 Eructation: Secondary | ICD-10-CM | POA: Diagnosis not present

## 2023-04-29 NOTE — Patient Instructions (Addendum)
   Food Guidelines for those with chronic digestive trouble:  Many people have difficulty digesting certain foods, causing a variety of distressing and embarrassing symptoms such as abdominal pain, bloating and gas.  These foods may need to be avoided or consumed in small amounts.  Here are some tips that might be helpful for you.  1.   Lactose intolerance is the difficulty or complete inability to digest lactose, the natural sugar in milk and anything made from milk.  This condition is harmless, common, and can begin any time during life.  Some people can digest a modest amount of lactose while others cannot tolerate any.  Also, not all dairy products contain equal amounts of lactose.  For example, hard cheeses such as parmesan have less lactose than soft cheeses such as cheddar.  Yogurt has less lactose than milk or cheese.  Many packaged foods (even many brands of bread) have milk, so read ingredient lists carefully.  It is difficult to test for lactose intolerance, so just try avoiding lactose as much as possible for a week and see what happens with your symptoms.  If you seem to be lactose intolerant, the best plan is to avoid it (but make sure you get calcium from another source).  The next best thing is to use lactase enzyme   Your previsit is on 08/27/2023 at 8:30am, a nurse will call you to go over your instructions, this is a free visit.   You have been scheduled for a colonoscopy. Please follow written instructions given to you at your visit today.  Please pick up your prep supplies at the pharmacy within the next 1-3 days. If you use inhalers (even only as needed), please bring them with you on the day of your procedure.  Please purchase the following medications over the counter and take as directed: Gas-X _______________________________________________________  If your blood pressure at your visit was 140/90 or greater, please contact your primary care physician to follow up on  this.  _______________________________________________________  If you are age 64 or older, your body mass index should be between 23-30. Your Body mass index is 31.98 kg/m. If this is out of the aforementioned range listed, please consider follow up with your Primary Care Provider.  If you are age 64 or younger, your body mass index should be between 19-25. Your Body mass index is 31.98 kg/m. If this is out of the aformentioned range listed, please consider follow up with your Primary Care Provider.   ________________________________________________________  The [place] [facility] providers would like to encourage you to use [facility] to communicate with providers for non-urgent requests or questions.  Due to long hold times on the telephone, sending your provider a message by [facility] may be a faster and more efficient way to get a response.  Please allow 48 business hours for a response.  Please remember that this is for non-urgent requests.  _______________________________________________________ It was a pleasure to see you today!  Thank you for trusting me with your gastrointestinal care!

## 2023-04-29 NOTE — Progress Notes (Signed)
Attending Physician's Attestation   I have reviewed the chart.   I agree with the Advanced Practitioner's note, impression, and recommendations with any updates as below.    Safiyah Cisney Mansouraty, MD [facility] Advanced Endoscopy Office # [phone]  

## 2023-04-29 NOTE — Progress Notes (Signed)
Assessment / Plan   Primary GI: Corliss Parish, MD  62 y.o. yo male with a past medical history consisting of, but not limited to colon cancer, s/p hemicolectomy, DM, HTN, PVCs, HLD, CKD.   History of colon cancer in 2004 s/p hemicolectomy. Small adenomatous colon polyp removed at last surveillance colonoscopy in October 2019.  -Will schedule for five year surveillance colonoscopy to be done in September 2024. The risks and benefits of colonoscopy with possible polypectomy / biopsies were discussed and the patient agrees to proceed.   Belching / flatulence . Probably 2/2 to diet and / or constipation  Consumes a lot of vegetables, especially cabbage.  -Low gas diet given.  -Trial of Gas-x -Treat constipation  Mild constipation characterized as decreased frequency of BMs.  -PCP recently recommended him to try Colace, he has had only 1 dose.  I have recommended that he increase Colace to 1-2 at bedtime.    GERD with regurgitation. Started ~ one year ago.  Asymptomatic on daily Prilosec   History of Present Illness   Chief Complaint: time for colonoscopy, excessive gas  Mr Orlov was seen here in 2019 as a new patient for history of colon cancer.  He had colon cancer ( ? Location) in 2004 and is status post hemicolectomy at outside facility  No [facility] of colon cancer. No blood in stool. He complains of excessive belching and flatulence and also over the last month has had a decrease in BM frequency.  Last week his PCP recommended trial of colace but so far he has  had only one dose. Drinks 4 bottles of water a day and tries to follow a health die rich in veggies. He eats a lot of cabbage.   Patient started Prilosec about a year ago for reflux / regurgitation.  Since starting daily Prilosec he has been asymptomatic.  No dysphagia    Previous Endoscopies / Labs /  Imaging   Oct 2019 colonoscopy -- Preparation of the colon was fair. - Non-thrombosed internal hemorrhoids found  on digital rectal exam. - The examined portion of the ileum was normal. - Stool in the entire examined colon. - Patent end-to-end colo-colonic anastomosis, characterized by healthy appearing mucosa. - Two 2 to 3 mm polyps at 70 cm proximal to the anus, removed with a cold snare. Resected and retrieved. - One 4 mm polyp at the recto-sigmoid colon, removed with a cold snare. Resected and retrieved. - Normal mucosa in the entire examined colon otherwise. - Non-bleeding non-thrombosed internal hemorrhoids.   Surgical [P], sigmoid, right colon, polyp (3) - TUBULAR ADENOMA (ONE). - COLONIC MUCOSA WITH BENIGN LYMPHOID AGGREGATE (TWO). - NO HIGH GRADE DYSPLASIA OR MALIGNA     Latest Ref Rng & Units 04/24/2023   10:37 AM 01/21/2023   10:08 AM 05/30/2022    3:54 PM  CBC  WBC 3.4 - 10.8 x10E3/uL 7.5  7.8  5.4   Hemoglobin 13.0 - 17.7 g/dL 78.2  95.6  21.3   Hematocrit 37.5 - 51.0 % 38.7  39.8  41.5   Platelets 150 - 450 x10E3/uL 334  311  327     No results found for: "LIPASE"    Latest Ref Rng & Units 04/24/2023   10:37 AM 01/21/2023   10:08 AM 05/30/2022    3:54 PM  CMP  Glucose 70 - 99 mg/dL 086  578  469   BUN 8 - 27 mg/dL 32  32  30   Creatinine 0.76 -  1.27 mg/dL 1.61  0.96  0.45   Sodium 134 - 144 mmol/L 139  139  140   Potassium 3.5 - 5.2 mmol/L 4.3  4.3  4.2   Chloride 96 - 106 mmol/L 99  98  101   CO2 20 - 29 mmol/L 22  24  23    Calcium 8.6 - 10.2 mg/dL 9.7  9.8  9.8   Total Protein 6.0 - 8.5 g/dL 7.1  7.3  7.4   Total Bilirubin 0.0 - 1.2 mg/dL 0.4  0.4  0.3   Alkaline Phos 44 - 121 IU/L 136  118  107   AST 0 - 40 IU/L 15  21  16    ALT 0 - 44 IU/L 16  26  15      Past Medical History:  Diagnosis Date   Arthritis    Colon cancer (HCC)    Diabetes mellitus without complication (HCC)    High cholesterol    History of colon cancer    History of colon cancer 2004   Hypertension    Past Surgical History:  Procedure Laterality Date   COLON SURGERY     KNEE ARTHROSCOPY Bilateral     TOTAL KNEE ARTHROPLASTY Right 11/30/2019   Procedure: RIGHT TOTAL KNEE ARTHROPLASTY;  Surgeon: Gean Birchwood, MD;  Location: [facility];  Service: Orthopedics;  Laterality: Right;   TRIGGER FINGER RELEASE Right 05/22/2019   Family History  Problem Relation Age of Onset   Diabetes Mother    Hypertension Mother    Diabetes Sister    Colon cancer Paternal Uncle    Esophageal cancer Neg Hx    Inflammatory bowel disease Neg Hx    Liver disease Neg Hx    Pancreatic cancer Neg Hx    Stomach cancer Neg Hx    Social History   Tobacco Use   Smoking status: Some Days    Packs/day: .25    Types: Cigarettes    Last attempt to quit: 2018    Years since quitting: 6.3   Smokeless tobacco: Never  Vaping Use   Vaping Use: Never used  Substance Use Topics   Alcohol use: No   Drug use: No   Current Outpatient Medications  Medication Sig Dispense Refill   Accu-Chek Softclix Lancets lancets Use as directed twice daily. 100 each 5   atorvastatin (LIPITOR) 40 MG tablet Take 1 tablet (40 mg total) by mouth every evening. 90 tablet 3   blood glucose meter kit and supplies KIT Dispense based on patient and insurance preference. Use up to four times daily as directed. (FOR ICD-10  E11.9). 1 each 0   Blood Pressure Monitor DEVI Use to check blood pressure daily 1 each 0   docusate sodium (COLACE) 100 MG capsule Take 1 capsule (100 mg total) by mouth 2 (two) times daily. 10 capsule 0   dorzolamide-timolol (COSOPT) 2-0.5 % ophthalmic solution Place 1 drop into the right eye 2 (two) times daily.     empagliflozin (JARDIANCE) 25 MG TABS tablet Take 1 tablet (25 mg total) by mouth daily before breakfast. 90 tablet 3   gabapentin (NEURONTIN) 300 MG capsule Take 1 capsule (300 mg total) by mouth 3 (three) times daily. 90 capsule 5   glucose blood (ACCU-CHEK GUIDE) test strip Use as instructed twice daily. 100 each 12   lisinopril-hydrochlorothiazide (ZESTORETIC) 20-25 MG tablet Take 1 tablet by mouth daily. 90  tablet 3   metFORMIN (GLUCOPHAGE) 1000 MG tablet Take 1 tablet (1,000 mg total) by mouth  2 (two) times daily with a meal. 180 tablet 3   omeprazole (PRILOSEC) 40 MG capsule Take 1 capsule (40 mg total) by mouth daily. 90 capsule 3   sildenafil (VIAGRA) 100 MG tablet Take 0.5 tablets (50 mg total) by mouth daily as needed for erectile dysfunction. 10 tablet 11   VYZULTA 0.024 % SOLN Apply 1 drop to eye every evening.     No current facility-administered medications for this visit.   Allergies  Allergen Reactions   Crab [Shellfish Allergy]     itching     Review of Systems: All  systems reviewed and negative except where noted in HPI.   Wt Readings from Last 3 Encounters:  04/29/23 235 lb 12.8 oz (107 kg)  04/25/23 236 lb 6.4 oz (107.2 kg)  04/24/23 235 lb (106.6 kg)    Physical Exam:  BP 120/78   Pulse 87   Ht 6' (1.829 m)   Wt 235 lb 12.8 oz (107 kg)   SpO2 100%   BMI 31.98 kg/m  Constitutional:  Pleasant, generally well appearing male in no acute distress. Psychiatric:  Normal mood and affect. Behavior is normal. EENT: Pupils normal.  Conjunctivae are normal. No scleral icterus. Neck supple.  Cardiovascular: Normal rate, regular rhythm.  Pulmonary/chest: Effort normal and breath sounds normal. No wheezing, rales or rhonchi. Abdominal: Soft, nondistended, nontender. Bowel sounds active throughout. There are no masses palpable. No hepatomegaly. Neurological: Alert and oriented to person place and time. Skin: Skin is warm and dry. No rashes noted.  Willette Cluster, NP  04/29/2023, 10:32 AM  Cc:  Ivonne Andrew, NP

## 2023-05-02 ENCOUNTER — Ambulatory Visit (INDEPENDENT_AMBULATORY_CARE_PROVIDER_SITE_OTHER): Payer: Medicaid Other | Admitting: Pharmacist

## 2023-05-02 DIAGNOSIS — I10 Essential (primary) hypertension: Secondary | ICD-10-CM | POA: Diagnosis not present

## 2023-05-02 MED ORDER — OZEMPIC (0.25 OR 0.5 MG/DOSE) 2 MG/3ML ~~LOC~~ SOPN: PEN_INJECTOR | SUBCUTANEOUS | 2 refills | Status: DC

## 2023-05-02 NOTE — Progress Notes (Signed)
05/02/2023 Name: Kyle Merritt MRN: [id number] DOB: Mar 11, 1961  Chief Complaint  Patient presents with   Medication Management   Hypertension   Diabetes    Kyle Merritt is a 62 y.o. year old male who presented for a face to face visit.   They were referred to the pharmacist by their PCP for assistance in managing diabetes.   Patient is participating in a Managed Medicaid Plan:  Yes  Subjective:  Care Team: Primary Care Provider: Ivonne Andrew, NP ; Next Scheduled Visit: 07/25/23  Medication Access/Adherence  Current Pharmacy:  [id number] - [address], [place] - [address] [place] [address] Phone: [phone] Fax: [phone]  [facility] - [place], [place] - [address] [place] [address] Phone: [phone] Fax: [phone]   Patient reports affordability concerns with their medications: No  Patient reports access/transportation concerns to their pharmacy: No  Patient reports adherence concerns with their medications:  No     Diabetes:  Current medications: metformin 1000 mg twice daily, Jardiance 25 mg daily  Current glucose readings: has not been checking  Patient denies hypoglycemic s/sx including dizziness, shakiness, sweating. Patient denies hyperglycemic symptoms including polyuria, polydipsia, polyphagia, nocturia, neuropathy, blurred vision.  Current meal patterns:  - Breakfast: BLT from store down the street, sometimes watermelon;  - Lunch: skips - Supper: Seafood, chicken; avoids pasta,  - Drinks: coca colas - has cut back to one a week  Current physical activity: gym every Wednesdays for an hour; hour of weights   Hypertension:  Current medications: lisinopril/HCTZ 20/25 mg daily   Hyperlipidemia/ASCVD Risk Reduction  Current lipid lowering medications: atorvastatin 40 mg daily    Objective:  Lab Results  Component  Value Date   HGBA1C 8.3 (A) 04/24/2023    Lab Results  Component Value Date   CREATININE 1.72 (H) 04/24/2023   BUN 32 (H) 04/24/2023   NA 139 04/24/2023   K 4.3 04/24/2023   CL 99 04/24/2023   CO2 22 04/24/2023    Lab Results  Component Value Date   CHOL 159 01/21/2023   HDL 46 01/21/2023   LDLCALC 88 01/21/2023   TRIG 140 01/21/2023   CHOLHDL 3.5 01/21/2023    Medications Reviewed Today     Reviewed by [name], RPH-CPP (Pharmacist) on 05/02/23 at 1641  Med List Status: <None>   Medication Order Taking? Sig Documenting Provider Last Dose Status Informant  Accu-Chek Softclix Lancets lancets [id number]  Use as directed twice daily. Ivonne Andrew, NP  Active   atorvastatin (LIPITOR) 40 MG tablet [id number]  Take 1 tablet (40 mg total) by mouth every evening. Ivonne Andrew, NP  Active   blood glucose meter kit and supplies KIT [id number]  Dispense based on patient and insurance preference. Use up to four times daily as directed. (FOR ICD-10  E11.9). Ivonne Andrew, NP  Active   Blood Pressure Monitor DEVI [id number]  Use to check blood pressure daily Ivonne Andrew, NP  Active   docusate sodium (COLACE) 100 MG capsule [id number]  Take 1 capsule (100 mg total) by mouth 2 (two) times daily. Ivonne Andrew, NP  Active   dorzolamide-timolol (COSOPT) 2-0.5 % ophthalmic solution [id number]  Place 1 drop into the right eye 2 (two) times daily. [provider]  Active   empagliflozin (JARDIANCE) 25 MG TABS tablet [id number] Yes Take 1 tablet (25 mg total) by mouth daily before  breakfast. Ivonne Andrew, NP Taking Active   gabapentin (NEURONTIN) 300 MG capsule [id number]  Take 1 capsule (300 mg total) by mouth 3 (three) times daily. Ivonne Andrew, NP  Active   glucose blood (ACCU-CHEK GUIDE) test strip [id number]  Use as instructed twice daily. Ivonne Andrew, NP  Active   lisinopril-hydrochlorothiazide (ZESTORETIC) 20-25 MG tablet [id number]  Take 1 tablet by  mouth daily. Ivonne Andrew, NP  Active   metFORMIN (GLUCOPHAGE) 1000 MG tablet [id number] Yes Take 1 tablet (1,000 mg total) by mouth 2 (two) times daily with a meal. Ivonne Andrew, NP Taking Active   omeprazole (PRILOSEC) 40 MG capsule [id number]  Take 1 capsule (40 mg total) by mouth daily. Ivonne Andrew, NP  Active   Semaglutide,0.25 or 0.5MG /DOS, (OZEMPIC, 0.25 OR 0.5 MG/DOSE,) 2 MG/3ML SOPN [id number] Yes Inject 0.25 mg weekly for 4 weeks then increase to 0.5 mg weekly Ivonne Andrew, NP  Active   sildenafil (VIAGRA) 100 MG tablet [id number]  Take 0.5 tablets (50 mg total) by mouth daily as needed for erectile dysfunction. Ivonne Andrew, NP  Active   VYZULTA 0.024 % SOLN [id number]  Apply 1 drop to eye every evening. [provider]  Active   Med List Note [name], [place], [place] 02/08/20 1542): +3              Assessment/Plan:   Diabetes: - Currently uncontrolled - Reviewed long term cardiovascular and renal outcomes of uncontrolled blood sugar - Reviewed goal A1c, goal fasting, and goal 2 hour post prandial glucose - Reviewed dietary modifications including: increase in fibers, focus on lean proteins, vegetables, whole grains, moderated portion sizes of carbohydrates.  - Reviewed lifestyle modifications including: increase exercise from 1 day per week to 2  - Recommend to start Ozempic. Discussed with PCP. Counseled on mechanism of action, side effects. Start 0.25 mg weekly for 4 weeks, then increase to 0.5 mg weekly. PA completed and approved  - Recommend to check glucose twice daily, fasting and 2 hour post prandial  Hypertension: - Currently controlled - Recommend to continue current regimen   Hyperlipidemia/ASCVD Risk Reduction: - Currently controlled.  - Recommend to continue current regimen  Follow Up Plan: phone call in 4 weeks  [name], PharmD, BCACP, CPP [facility] [phone]

## 2023-05-02 NOTE — Patient Instructions (Signed)
Veer,   It was great talking to you today!  Start Ozempic 0.25 mg weekly for 4 weeks, then increase to 0.5 mg weekly. This medication may cause stomach upset, queasiness, or constipation, especially when first starting. This generally improves over time. Call our office if these symptoms occur and worsen, or if you have severe symptoms such as vomiting, diarrhea, or stomach pain.   Keep up going to the gym 1-2 times weekly. Incorporate fibers into your diet with fruits and vegetables and whole grains.   Check your blood sugars twice daily:  1) Fasting, first thing in the morning before breakfast and  2) 2 hours after your largest meal.   For a goal A1c of less than 7%, goal fasting readings are less than 130 and goal 2 hour after meal readings are less than 180.    Take care!  [name], PharmD, BCACP, CPP [facility] [phone]

## 2023-05-28 ENCOUNTER — Other Ambulatory Visit: Payer: Medicaid Other | Admitting: Pharmacist

## 2023-05-28 MED ORDER — OZEMPIC (0.25 OR 0.5 MG/DOSE) 2 MG/3ML ~~LOC~~ SOPN: 0.5000 mg | PEN_INJECTOR | SUBCUTANEOUS | 2 refills | Status: DC

## 2023-05-28 NOTE — Patient Instructions (Signed)
Tedd,   Keep up the great work!  Continue Ozempic 0.5 mg weekly, Jardiance 25 mg daily, and metformin 1000 mg twice daily.   Check your blood sugars twice daily:  1) Fasting, first thing in the morning before breakfast and  2) 2 hours after your largest meal.   For a goal A1c of less than 7%, goal fasting readings are less than 130 and goal 2 hour after meal readings are less than 180.    Continue the lisinopril/HCTZ for blood pressure and atorvastatin for cholesterol.   [name], PharmD, BCACP, CPP [facility] [phone]

## 2023-05-28 NOTE — Progress Notes (Signed)
05/28/2023 Name: Kyle Merritt MRN: [id number] DOB: [birth year]  Chief Complaint  Patient presents with   Medication Management   Diabetes    Kyle Merritt is a 62 y.o. year old male who presented for a telephone visit.   They were referred to the pharmacist by their PCP for assistance in managing diabetes.   Patient is participating in a Managed Medicaid Plan:  Yes  Subjective:  Care Team: Primary Care Provider: Ivonne Andrew, NP ; Next Scheduled Visit: 07/25/23  Medication Access/Adherence  Current Pharmacy:  [id number] - [address], [place] - [address] [place] [address] Phone: [phone] Fax: [phone]  [facility] - [place], [place] - [address] [place] [address] Phone: [phone] Fax: [phone]   Patient reports affordability concerns with their medications: No  Patient reports access/transportation concerns to their pharmacy: No  Patient reports adherence concerns with their medications:  No     Diabetes:  Current medications: Ozempic 0.5 mg weekly, metformin 1000 mg twice daily, Jardiance 25 mg daily  Indicates a decreased appetite since starting Ozempic, but no GI upset, constipation.   Current glucose readings: 120-130s  Patient denies hypoglycemic s/sx including dizziness, shakiness, sweating. Patient denies hyperglycemic symptoms including polyuria, polydipsia, polyphagia, nocturia, neuropathy, blurred vision.  Current meal patterns Breakfast: sometimes skips Lunch/supper: chicken, broccoli; squash; meat and vegetables  Hypertension:  Current medications: lisinopril 20/25 mg daily   Hyperlipidemia/ASCVD Risk Reduction  Current lipid lowering medications: atorvastatin 40 mg daily    Objective:  Lab Results  Component Value Date   HGBA1C 8.3 (A) 04/24/2023    Lab Results  Component Value Date   CREATININE 1.72  (H) 04/24/2023   BUN 32 (H) 04/24/2023   NA 139 04/24/2023   K 4.3 04/24/2023   CL 99 04/24/2023   CO2 22 04/24/2023    Lab Results  Component Value Date   CHOL 159 01/21/2023   HDL 46 01/21/2023   LDLCALC 88 01/21/2023   TRIG 140 01/21/2023   CHOLHDL 3.5 01/21/2023    Medications Reviewed Today     Reviewed by [name], RPH-CPP (Pharmacist) on 05/02/23 at 1641  Med List Status: <None>   Medication Order Taking? Sig Documenting Provider Last Dose Status Informant  Accu-Chek Softclix Lancets lancets [id number]  Use as directed twice daily. Ivonne Andrew, NP  Active   atorvastatin (LIPITOR) 40 MG tablet [id number]  Take 1 tablet (40 mg total) by mouth every evening. Ivonne Andrew, NP  Active   blood glucose meter kit and supplies KIT [id number]  Dispense based on patient and insurance preference. Use up to four times daily as directed. (FOR ICD-10  E11.9). Ivonne Andrew, NP  Active   Blood Pressure Monitor DEVI [id number]  Use to check blood pressure daily Ivonne Andrew, NP  Active   docusate sodium (COLACE) 100 MG capsule [id number]  Take 1 capsule (100 mg total) by mouth 2 (two) times daily. Ivonne Andrew, NP  Active   dorzolamide-timolol (COSOPT) 2-0.5 % ophthalmic solution [id number]  Place 1 drop into the right eye 2 (two) times daily. [provider]  Active   empagliflozin (JARDIANCE) 25 MG TABS tablet [id number] Yes Take 1 tablet (25 mg total) by mouth daily before breakfast. Ivonne Andrew, NP Taking Active   gabapentin (NEURONTIN) 300 MG capsule [id number]  Take 1 capsule (300 mg total) by mouth 3 (three) times daily.  Ivonne Andrew, NP  Active   glucose blood (ACCU-CHEK GUIDE) test strip [id number]  Use as instructed twice daily. Ivonne Andrew, NP  Active   lisinopril-hydrochlorothiazide (ZESTORETIC) 20-25 MG tablet [id number]  Take 1 tablet by mouth daily. Ivonne Andrew, NP  Active   metFORMIN (GLUCOPHAGE) 1000 MG tablet [id number] Yes Take  1 tablet (1,000 mg total) by mouth 2 (two) times daily with a meal. Ivonne Andrew, NP Taking Active   omeprazole (PRILOSEC) 40 MG capsule [id number]  Take 1 capsule (40 mg total) by mouth daily. Ivonne Andrew, NP  Active   Semaglutide,0.25 or 0.5MG /DOS, (OZEMPIC, 0.25 OR 0.5 MG/DOSE,) 2 MG/3ML SOPN [id number] Yes Inject 0.25 mg weekly for 4 weeks then increase to 0.5 mg weekly Ivonne Andrew, NP  Active   sildenafil (VIAGRA) 100 MG tablet [id number]  Take 0.5 tablets (50 mg total) by mouth daily as needed for erectile dysfunction. Ivonne Andrew, NP  Active   VYZULTA 0.024 % SOLN [id number]  Apply 1 drop to eye every evening. [provider]  Active   Med List Note Kyle Merritt, [place], [place] 02/08/20 1542): +3              Assessment/Plan:   Diabetes: - Currently uncontrolled but improved  - Reviewed goal A1c, goal fasting, and goal 2 hour post prandial glucose - Reviewed dietary modifications including: focus on lean proteins, fruits and vegetables, whole grains.  - Recommend to continue current regimen. Follow up in 4 weeks. Will update Ozempic script w/ PCP to reflect current dosing.  - Recommend to check glucose twice daily, fasting and 2 hour post prandial   Hypertension: - Currently controlled - Reviewed appropriate blood pressure monitoring technique and reviewed goal blood pressure. Recommended to check home blood pressure and heart rate periodically - Recommend to continue current regimen   Hyperlipidemia/ASCVD Risk Reduction: - Currently controlled.  - Recommend to continue current regimen  Follow Up Plan: phone call in 4 weeks  [name], PharmD, BCACP, CPP [facility] [phone]

## 2023-06-19 ENCOUNTER — Other Ambulatory Visit: Payer: Self-pay | Admitting: Nurse Practitioner

## 2023-06-19 MED ORDER — SILDENAFIL CITRATE 100 MG PO TABS: 50.0000 mg | ORAL_TABLET | Freq: Every day | ORAL | 1 refills | Status: DC | PRN

## 2023-06-19 NOTE — Telephone Encounter (Signed)
Please advise in Tonya absence. Thanks KH 

## 2023-06-25 ENCOUNTER — Other Ambulatory Visit: Payer: Medicaid Other | Admitting: Pharmacist

## 2023-06-25 NOTE — Patient Instructions (Signed)
Janes,   It was great talking to you today. Keep up the fantastic work with your current regimen.   Check your blood sugars twice daily:  1) Fasting, first thing in the morning before breakfast and  2) 2 hours after your largest meal.   For a goal A1c of less than 7%, goal fasting readings are less than 130 and goal 2 hour after meal readings are less than 180.    Check your blood pressure twice weekly, and any time you have concerning symptoms like headache, chest pain, dizziness, shortness of breath, or vision changes.   Our goal is less than 130/80.  To appropriately check your blood pressure, make sure you do the following:  1) Avoid caffeine, exercise, or tobacco products for 30 minutes before checking. Empty your bladder. 2) Sit with your back supported in a flat-backed chair. Rest your arm on something flat (arm of the chair, table, etc). 3) Sit still with your feet flat on the floor, resting, for at least 5 minutes.  4) Check your blood pressure. Take 1-2 readings.  5) Write down these readings and bring with you to any provider appointments.  Bring your home blood pressure machine with you to a provider's office for accuracy comparison at least once a year.   Make sure you take your blood pressure medications before you come to any office visit, even if you were asked to fast for labs.   [name], PharmD, BCACP, CPP Clinical Pharmacist [facility] [phone]

## 2023-06-25 NOTE — Progress Notes (Signed)
06/25/2023 Name: Kyle Merritt MRN: [id number] DOB: 1961-11-01  Chief Complaint  Patient presents with   Medication Management   Diabetes   Hyperlipidemia   Hypertension    Kyle Merritt is a 62 y.o. year old male who presented for a telephone visit.   They were referred to the pharmacist by their PCP for assistance in managing diabetes, hypertension, and hyperlipidemia.    Subjective:  Care Team: Primary Care Provider: Ivonne Andrew, NP ; Next Scheduled Visit: 07/25/23  Medication Access/Adherence  Current Pharmacy:  [id number] - [address] [place] - [address] [place] [address] Phone: [phone] Fax: [phone]  [facility] - [place], [place] - [address] [place] [address] Phone: [phone] Fax: [phone]   Patient reports affordability concerns with their medications: No  Patient reports access/transportation concerns to their pharmacy: No  Patient reports adherence concerns with their medications:  No     Diabetes:  Current medications: metformin 1000 mg twice daily, Ozempic 0.5 mg weekly   Notes he has lost weight - unsure how much, he has not weighed, but can tell based on how his clothes feel. Denies significant nausea, stomach upset, constipation.   Current glucose readings: fastings: 90-100s; 2 hours after meals: 110-120s  Patient denies hypoglycemic s/sx including dizziness, shakiness, sweating. Patient denies hyperglycemic symptoms including polyuria, polydipsia, polyphagia, nocturia, neuropathy, blurred vision.   Hypertension:   Current medications: lisinopril 20/25 mg daily  Patient has a validated, automated, upper arm home BP cuff Current blood pressure readings readings: has not been checking    Hyperlipidemia/ASCVD Risk Reduction   Current lipid lowering medications: atorvastatin 40 mg daily   Objective:  Lab  Results  Component Value Date   HGBA1C 8.3 (A) 04/24/2023    Lab Results  Component Value Date   CREATININE 1.72 (H) 04/24/2023   BUN 32 (H) 04/24/2023   NA 139 04/24/2023   K 4.3 04/24/2023   CL 99 04/24/2023   CO2 22 04/24/2023    Lab Results  Component Value Date   CHOL 159 01/21/2023   HDL 46 01/21/2023   LDLCALC 88 01/21/2023   TRIG 140 01/21/2023   CHOLHDL 3.5 01/21/2023    Medications Reviewed Today     Reviewed by [name], RPH-CPP (Pharmacist) on 05/28/23 at 1305  Med List Status: <None>   Medication Order Taking? Sig Documenting Provider Last Dose Status Informant  Accu-Chek Softclix Lancets lancets [id number]  Use as directed twice daily. Ivonne Andrew, NP  Active   atorvastatin (LIPITOR) 40 MG tablet [id number] Yes Take 1 tablet (40 mg total) by mouth every evening. Ivonne Andrew, NP Taking Active   blood glucose meter kit and supplies KIT [id number]  Dispense based on patient and insurance preference. Use up to four times daily as directed. (FOR ICD-10  E11.9). Ivonne Andrew, NP  Active   Blood Pressure Monitor DEVI [id number]  Use to check blood pressure daily Ivonne Andrew, NP  Active   docusate sodium (COLACE) 100 MG capsule [id number]  Take 1 capsule (100 mg total) by mouth 2 (two) times daily. Ivonne Andrew, NP  Active   dorzolamide-timolol (COSOPT) 2-0.5 % ophthalmic solution [id number]  Place 1 drop into the right eye 2 (two) times daily. [provider]  Active   empagliflozin (JARDIANCE) 25 MG TABS tablet [id number] Yes Take 1 tablet (25 mg total) by mouth daily before breakfast.  Ivonne Andrew, NP Taking Active   gabapentin (NEURONTIN) 300 MG capsule [id number] No Take 1 capsule (300 mg total) by mouth 3 (three) times daily.  Patient not taking: Reported on 05/28/2023   Ivonne Andrew, NP Not Taking Active   glucose blood (ACCU-CHEK GUIDE) test strip [id number]  Use as instructed twice daily. Ivonne Andrew, NP  Active    lisinopril-hydrochlorothiazide (ZESTORETIC) 20-25 MG tablet [id number] Yes Take 1 tablet by mouth daily. Ivonne Andrew, NP Taking Active   metFORMIN (GLUCOPHAGE) 1000 MG tablet [id number] Yes Take 1 tablet (1,000 mg total) by mouth 2 (two) times daily with a meal. Ivonne Andrew, NP Taking Active   omeprazole (PRILOSEC) 40 MG capsule [id number] No Take 1 capsule (40 mg total) by mouth daily.  Patient not taking: Reported on 05/28/2023   Ivonne Andrew, NP Not Taking Active   Semaglutide,0.25 or 0.5MG /DOS, (OZEMPIC, 0.25 OR 0.5 MG/DOSE,) 2 MG/3ML SOPN [id number] Yes Inject 0.25 mg weekly for 4 weeks then increase to 0.5 mg weekly Ivonne Andrew, NP Taking Active   sildenafil (VIAGRA) 100 MG tablet [id number]  Take 0.5 tablets (50 mg total) by mouth daily as needed for erectile dysfunction. Ivonne Andrew, NP  Active   VYZULTA 0.024 % SOLN [id number]  Apply 1 drop to eye every evening. [provider]  Active   Med List Note [name], [place], [place] 02/08/20 1542): +3              Assessment/Plan:   Diabetes: - Currently controlled per home readings.  - Reviewed long term cardiovascular and renal outcomes of uncontrolled blood sugar - Reviewed goal A1c, goal fasting, and goal 2 hour post prandial glucose - Recommend to continue current regimen. Follow up with PCP in 4 weeks   Hypertension: - Currently controlled - Reviewed appropriate blood pressure monitoring technique and reviewed goal blood pressure. Recommended to check home blood pressure and heart rate periodically - Recommend to continue current regimen    Hyperlipidemia/ASCVD Risk Reduction: - Currently controlled.  - Recommend to continue current regimen   Follow Up Plan: PCP in 4 weeks; pharmacist pending A1c  [name], PharmD, BCACP, CPP Clinical Pharmacist [facility] [phone]

## 2023-07-25 ENCOUNTER — Encounter: Payer: Self-pay | Admitting: Nurse Practitioner

## 2023-07-25 ENCOUNTER — Ambulatory Visit (INDEPENDENT_AMBULATORY_CARE_PROVIDER_SITE_OTHER): Payer: Medicaid Other | Admitting: Nurse Practitioner

## 2023-07-25 VITALS — BP 106/77 | HR 96 | Temp 97.4°F | Wt 215.4 lb

## 2023-07-25 DIAGNOSIS — E1169 Type 2 diabetes mellitus with other specified complication: Secondary | ICD-10-CM

## 2023-07-25 LAB — POCT GLYCOSYLATED HEMOGLOBIN (HGB A1C): Hemoglobin A1C: 6.7 % — AB (ref 4.0–5.6)

## 2023-07-25 MED ORDER — METFORMIN HCL 1000 MG PO TABS: 1000.0000 mg | ORAL_TABLET | Freq: Every day | ORAL | Status: DC

## 2023-07-25 NOTE — Progress Notes (Signed)
@Patient  ID: Kyle Merritt, male    DOB: 12-01-1961, 62 y.o.   MRN: [id number]  Chief Complaint  Patient presents with   Follow-up    Referring provider: Ivonne Andrew, NP   HPI  Kyle Merritt presents for follow up. He  has a past medical history of Arthritis, Colon cancer (HCC), Diabetes mellitus without complication (HCC), High cholesterol, History of colon cancer, History of colon cancer (2004), and Hypertension.      Diabetes Mellitus  Patient presents for follow up of diabetes. Current symptoms include: hyperglycemia and weight gain. Patient denies foot ulcerations, hypoglycemia , increased appetite, nausea, paresthesia of the feet, polydipsia and polyuria. Evaluation to date has included: fasting blood sugar, fasting lipid panel, hemoglobin A1C and microalbuminuria.  Home sugars: not checking. Current treatment: more intensive attention to diet which has not been effective, Continued metformin which has been effective, Continued statin which has been effective and Continued ACE inhibitor/ARB which has been effective. A1C is 6.7 today. Has lost around 25 pounds.  Patient would like to stop diabetic medications.  We discussed that he can cut back to 1 tablet a day on metformin. Continue metformin and jardiance.    Patient is not checking home blood sugars.   Home blood sugar records: patient does not check sugars How often is blood sugars being checked:  Current symptoms/problems include none and have been worsening.      Allergies  Allergen Reactions   Crab [Shellfish Allergy]     itching    Immunization History  Administered Date(s) Administered   Influenza Whole 12/08/2008, 02/03/2010   Influenza,inj,Quad PF,6+ Mos 10/17/2018, 08/26/2019, 12/12/2020, 10/19/2022   Pneumococcal Polysaccharide-23 03/24/2009, 01/29/2018   Td 12/08/2008   Tdap 01/29/2018    Past Medical History:  Diagnosis Date   Arthritis    Colon cancer (HCC)    Diabetes mellitus without  complication (HCC)    High cholesterol    History of colon cancer    History of colon cancer 2004   Hypertension     Tobacco History: Social History   Tobacco Use  Smoking Status Some Days   Current packs/day: 0.00   Types: Cigarettes   Last attempt to quit: 2018   Years since quitting: 6.5  Smokeless Tobacco Never   Ready to quit: Not Answered Counseling given: Not Answered   Outpatient Encounter Medications as of 07/25/2023  Medication Sig   atorvastatin (LIPITOR) 40 MG tablet Take 1 tablet (40 mg total) by mouth every evening.   blood glucose meter kit and supplies KIT Dispense based on patient and insurance preference. Use up to four times daily as directed. (FOR ICD-10  E11.9).   Blood Pressure Monitor DEVI Use to check blood pressure daily   dorzolamide-timolol (COSOPT) 2-0.5 % ophthalmic solution Place 1 drop into the right eye 2 (two) times daily.   empagliflozin (JARDIANCE) 25 MG TABS tablet Take 1 tablet (25 mg total) by mouth daily before breakfast.   glucose blood (ACCU-CHEK GUIDE) test strip Use as instructed twice daily.   lisinopril-hydrochlorothiazide (ZESTORETIC) 20-25 MG tablet Take 1 tablet by mouth daily.   Semaglutide,0.25 or 0.5MG /DOS, (OZEMPIC, 0.25 OR 0.5 MG/DOSE,) 2 MG/3ML SOPN Inject 0.5 mg into the skin once a week.   sildenafil (VIAGRA) 100 MG tablet Take 0.5 tablets (50 mg total) by mouth daily as needed for erectile dysfunction.   VYZULTA 0.024 % SOLN Apply 1 drop to eye every evening.   [DISCONTINUED] metFORMIN (GLUCOPHAGE) 1000 MG tablet Take 1 tablet (1,000  mg total) by mouth 2 (two) times daily with a meal.   Accu-Chek Softclix Lancets lancets Use as directed twice daily.   docusate sodium (COLACE) 100 MG capsule Take 1 capsule (100 mg total) by mouth 2 (two) times daily. (Patient not taking: Reported on 07/25/2023)   gabapentin (NEURONTIN) 300 MG capsule Take 1 capsule (300 mg total) by mouth 3 (three) times daily. (Patient not taking: Reported on  05/28/2023)   metFORMIN (GLUCOPHAGE) 1000 MG tablet Take 1 tablet (1,000 mg total) by mouth daily with breakfast.   omeprazole (PRILOSEC) 40 MG capsule Take 1 capsule (40 mg total) by mouth daily. (Patient not taking: Reported on 05/28/2023)   No facility-administered encounter medications on file as of 07/25/2023.     Review of Systems  Review of Systems  Constitutional: Negative.   HENT: Negative.    Cardiovascular: Negative.   Gastrointestinal: Negative.   Allergic/Immunologic: Negative.   Neurological: Negative.   Psychiatric/Behavioral: Negative.         Physical Exam  BP 106/77   Pulse 96   Temp (!) 97.4 F (36.3 C) (Temporal)   Wt 215 lb 6.4 oz (97.7 kg)   SpO2 100%   BMI 29.21 kg/m   Wt Readings from Last 5 Encounters:  07/25/23 215 lb 6.4 oz (97.7 kg)  04/29/23 235 lb 12.8 oz (107 kg)  04/25/23 236 lb 6.4 oz (107.2 kg)  04/24/23 235 lb (106.6 kg)  04/17/23 242 lb (109.8 kg)     Physical Exam Vitals and nursing note reviewed.  Constitutional:      General: He is not in acute distress.    Appearance: He is well-developed.  Cardiovascular:     Rate and Rhythm: Normal rate and regular rhythm.  Pulmonary:     Effort: Pulmonary effort is normal.     Breath sounds: Normal breath sounds.  Skin:    General: Skin is warm and dry.  Neurological:     Mental Status: He is alert and oriented to person, place, and time.  Psychiatric:        Mood and Affect: Mood normal.        Behavior: Behavior normal.       Assessment & Plan:   Diabetes (HCC) - POCT glycosylated hemoglobin (Hb A1C) - metFORMIN (GLUCOPHAGE) 1000 MG tablet; Take 1 tablet (1,000 mg total) by mouth daily with breakfast. - CBC - Comprehensive metabolic panel   Follow up:  Follow up in 3 months     Ivonne Andrew, NP 07/25/2023

## 2023-07-25 NOTE — Patient Instructions (Addendum)
1. Type 2 diabetes mellitus with other specified complication, without long-term current use of insulin (HCC)  - POCT glycosylated hemoglobin (Hb A1C) - metFORMIN (GLUCOPHAGE) 1000 MG tablet; Take 1 tablet (1,000 mg total) by mouth daily with breakfast. - CBC - Comprehensive metabolic panel   Follow up:  Follow up in 3 months

## 2023-07-25 NOTE — Assessment & Plan Note (Signed)
-   POCT glycosylated hemoglobin (Hb A1C) - metFORMIN (GLUCOPHAGE) 1000 MG tablet; Take 1 tablet (1,000 mg total) by mouth daily with breakfast. - CBC - Comprehensive metabolic panel   Follow up:  Follow up in 3 months

## 2023-07-30 ENCOUNTER — Ambulatory Visit (INDEPENDENT_AMBULATORY_CARE_PROVIDER_SITE_OTHER): Payer: Medicaid Other | Admitting: Podiatry

## 2023-07-30 DIAGNOSIS — B351 Tinea unguium: Secondary | ICD-10-CM

## 2023-08-02 ENCOUNTER — Other Ambulatory Visit: Payer: Self-pay | Admitting: Nurse Practitioner

## 2023-08-02 ENCOUNTER — Telehealth: Payer: Self-pay | Admitting: *Deleted

## 2023-08-02 NOTE — Telephone Encounter (Signed)
   Pre-operative Risk Assessment    Patient Name: Kyle Merritt  DOB: 04/11/1961 MRN: [id number]      Request for Surgical Clearance    Procedure:   RIGHT CARPAL TUNNEL RELEASE  Date of Surgery:  Clearance TBD                                 Surgeon:  DR. CHARLES BENFIELD  Surgeon's Group or Practice Name:  [facility] Phone number:  [phone] ATTN: KERRI MAZE Fax number:  [phone]   Type of Clearance Requested:   - Medical ; NO MEDICATIONS LISTED AS NEEDING TO BE HELD   Type of Anesthesia:  MAC WITH REGIONAL   Additional requests/questions:    Elpidio Anis   08/02/2023, 4:42 PM

## 2023-08-02 NOTE — Telephone Encounter (Signed)
Please advise KH 

## 2023-08-06 NOTE — Telephone Encounter (Signed)
   Name: Kyle Merritt  DOB: Apr 05, 1961  MRN: [id number]  Primary Cardiologist: Chrystie Nose, MD  Chart reviewed as part of pre-operative protocol coverage. Because of [name] past medical history and time since last visit, he will require a follow-up Telephone visit in order to better assess preoperative cardiovascular risk.  Pre-op covering staff: - Please schedule appointment and call patient to inform them. If patient already had an upcoming appointment within acceptable timeframe, please add "pre-op clearance" to the appointment notes so provider is aware. - Please contact requesting surgeon's office via preferred method (i.e, phone, fax) to inform them of need for appointment prior to surgery.  Jonita Albee, PA-C  08/06/2023, 12:00 PM

## 2023-08-07 NOTE — Telephone Encounter (Signed)
1st attempt to reach pt regarding surgical clearance and the need for an TELEVISIT appointment. Unable to LVM as it has not yet been set up.

## 2023-08-12 ENCOUNTER — Telehealth: Payer: Self-pay

## 2023-08-12 NOTE — Telephone Encounter (Signed)
Pt is scheduled for tele appt on 08/28 at 2pm. Med rec and consent done.

## 2023-08-12 NOTE — Telephone Encounter (Signed)
Pt is scheduled for tele appt on 08/28 at 2pm. Med rec and consent done.     Patient Consent for Virtual Visit        Kyle Merritt has provided verbal consent on 08/12/2023 for a virtual visit (video or telephone).   CONSENT FOR VIRTUAL VISIT FOR:  Kyle Merritt  By participating in this virtual visit I agree to the following:  I hereby voluntarily request, consent and authorize [facility] and its employed or contracted physicians, physician assistants, nurse practitioners or other licensed health care professionals (the Practitioner), to provide me with telemedicine health care services (the "Services") as deemed necessary by the treating Practitioner. I acknowledge and consent to receive the Services by the Practitioner via telemedicine. I understand that the telemedicine visit will involve communicating with the Practitioner through live audiovisual communication technology and the disclosure of certain medical information by electronic transmission. I acknowledge that I have been given the opportunity to request an in-person assessment or other available alternative prior to the telemedicine visit and am voluntarily participating in the telemedicine visit.  I understand that I have the right to withhold or withdraw my consent to the use of telemedicine in the course of my care at any time, without affecting my right to future care or treatment, and that the Practitioner or I may terminate the telemedicine visit at any time. I understand that I have the right to inspect all information obtained and/or recorded in the course of the telemedicine visit and may receive copies of available information for a reasonable fee.  I understand that some of the potential risks of receiving the Services via telemedicine include:  Delay or interruption in medical evaluation due to technological equipment failure or disruption; Information transmitted may not be sufficient (e.g. poor resolution of  images) to allow for appropriate medical decision making by the Practitioner; and/or  In rare instances, security protocols could fail, causing a breach of personal health information.  Furthermore, I acknowledge that it is my responsibility to provide information about my medical history, conditions and care that is complete and accurate to the best of my ability. I acknowledge that Practitioner's advice, recommendations, and/or decision may be based on factors not within their control, such as incomplete or inaccurate data provided by me or distortions of diagnostic images or specimens that may result from electronic transmissions. I understand that the practice of medicine is not an exact science and that Practitioner makes no warranties or guarantees regarding treatment outcomes. I acknowledge that a copy of this consent can be made available to me via my patient portal [facility] MyChart), or I can request a printed copy by calling the office of [facility].    I understand that my insurance will be billed for this visit.   I have read or had this consent read to me. I understand the contents of this consent, which adequately explains the benefits and risks of the Services being provided via telemedicine.  I have been provided ample opportunity to ask questions regarding this consent and the Services and have had my questions answered to my satisfaction. I give my informed consent for the services to be provided through the use of telemedicine in my medical care

## 2023-08-20 NOTE — Progress Notes (Signed)
Virtual Visit via Telephone Note   Because of Kyle Merritt's co-morbid illnesses, he is at least at moderate risk for complications without adequate follow up.  This format is felt to be most appropriate for this patient at this time.  The patient did not have access to video technology/had technical difficulties with video requiring transitioning to audio format only (telephone).  All issues noted in this document were discussed and addressed.  No physical exam could be performed with this format.  Please refer to the patient's chart for his consent to telehealth for [facility].  Evaluation Performed:  Preoperative cardiovascular risk assessment _____________   Date:  08/20/2023   Patient ID:  Kyle Merritt, DOB [birth year], MRN [id number] Patient Location:  Home Provider location:   Office  Primary Care Provider:  Ivonne Andrew, NP Primary Cardiologist:  Chrystie Nose, MD  Chief Complaint / Patient Profile   62 y.o. y/o male with a h/o frequent PVCs/palpitations, hypertension, hyperlipidemia, T2DM, GERD, colon cancer who is pending right carpal tunnel release by Dr. Frazier Butt and presents today for telephonic preoperative cardiovascular risk assessment.  History of Present Illness    Kyle Merritt is a 62 y.o. male who presents via audio/video conferencing for a telehealth visit today.  Pt was last seen in cardiology clinic on 04/25/2023 by Bernadene Person, NP.  At that time Kyle Merritt was stable from a cardiac standpoint.  The patient is now pending procedure as outlined above. Since his last visit, he is doing well. Patient denies shortness of breath or dyspnea on exertion. No chest pain, pressure, or tightness. Denies lower extremity edema, orthopnea, or PND. No palpitations. He is very active walking most places he goes, working at a car wash, and going to the gym 1 night a week.   Past Medical History    Past Medical History:  Diagnosis Date   Arthritis    Colon cancer  (HCC)    Diabetes mellitus without complication (HCC)    High cholesterol    History of colon cancer    History of colon cancer 2004   Hypertension    Past Surgical History:  Procedure Laterality Date   COLON SURGERY     KNEE ARTHROSCOPY Bilateral    TOTAL KNEE ARTHROPLASTY Right 11/30/2019   Procedure: RIGHT TOTAL KNEE ARTHROPLASTY;  Surgeon: Gean Birchwood, MD;  Location: [facility];  Service: Orthopedics;  Laterality: Right;   TRIGGER FINGER RELEASE Right 05/22/2019    Allergies  Allergies  Allergen Reactions   Crab [Shellfish Allergy]     itching    Home Medications    Prior to Admission medications   Medication Sig Start Date End Date Taking? Authorizing Provider  Accu-Chek Softclix Lancets lancets Use as directed twice daily. 05/31/22   Ivonne Andrew, NP  atorvastatin (LIPITOR) 40 MG tablet Take 1 tablet (40 mg total) by mouth every evening. 04/24/23 04/23/24  Ivonne Andrew, NP  blood glucose meter kit and supplies KIT Dispense based on patient and insurance preference. Use up to four times daily as directed. (FOR ICD-10  E11.9). 05/30/22   Ivonne Andrew, NP  Blood Pressure Monitor DEVI Use to check blood pressure daily 11/06/22   Ivonne Andrew, NP  docusate sodium (COLACE) 100 MG capsule Take 1 capsule (100 mg total) by mouth 2 (two) times daily. 04/24/23   Ivonne Andrew, NP  dorzolamide-timolol (COSOPT) 2-0.5 % ophthalmic solution Place 1 drop into the right eye 2 (two) times daily. 11/01/22  [provider]  empagliflozin (JARDIANCE) 25 MG TABS tablet Take 1 tablet (25 mg total) by mouth daily before breakfast. 04/24/23   Ivonne Andrew, NP  gabapentin (NEURONTIN) 300 MG capsule Take 1 capsule (300 mg total) by mouth 3 (three) times daily. 04/24/23   Ivonne Andrew, NP  glucose blood (ACCU-CHEK GUIDE) test strip Use as instructed twice daily. 05/31/22   Ivonne Andrew, NP  lisinopril-hydrochlorothiazide (ZESTORETIC) 20-25 MG tablet Take 1 tablet by mouth daily.  04/24/23   Ivonne Andrew, NP  metFORMIN (GLUCOPHAGE) 1000 MG tablet Take 1 tablet (1,000 mg total) by mouth daily with breakfast. 07/25/23 07/24/24  Ivonne Andrew, NP  omeprazole (PRILOSEC) 40 MG capsule Take 1 capsule (40 mg total) by mouth daily. 04/24/23 04/23/24  Ivonne Andrew, NP  Semaglutide,0.25 or 0.5MG /DOS, (OZEMPIC, 0.25 OR 0.5 MG/DOSE,) 2 MG/3ML SOPN Inject 0.5 mg into the skin once a week. 05/28/23   Ivonne Andrew, NP  sildenafil (VIAGRA) 100 MG tablet TAKE 1/2 TABLET BY MOUTH DAILY AS NEEDED FOR ERECTILE DYSFUNCTION 08/02/23   Ivonne Andrew, NP  VYZULTA 0.024 % SOLN Apply 1 drop to eye every evening. 11/01/22   [provider]    Physical Exam    Vital Signs:  Kyle Merritt does not have vital signs available for review today.  Given telephonic nature of communication, physical exam is limited. AAOx3. NAD. Normal affect.  Speech and respirations are unlabored.  Accessory Clinical Findings    None  Assessment & Plan    Primary Cardiologist: Chrystie Nose, MD  Preoperative cardiovascular risk assessment. Right carpal tunnel release by Dr. Frazier Butt  Chart reviewed as part of pre-operative protocol coverage. According to the RCRI, patient has a 0.4% risk of MACE. Patient reports activity equivalent to >4.0 METS (walks a lot, works at car wash, goes to the gym 1 day a week).   Given past medical history and time since last visit, based on ACC/AHA guidelines, Kyle Merritt would be at acceptable risk for the planned procedure without further cardiovascular testing.   Patient was advised that if he develops new symptoms prior to surgery to contact our office to arrange a follow-up appointment.  he verbalized understanding.   I will route this recommendation to the requesting party via Epic fax function.  Please call with questions.  Time:   Today, I have spent 5 minutes with the patient with telehealth technology discussing medical history, symptoms, and management  plan.     Kyle Levering, NP  08/20/2023, 3:49 PM

## 2023-08-21 ENCOUNTER — Ambulatory Visit: Payer: Medicaid Other | Attending: Cardiology | Admitting: Student

## 2023-08-21 DIAGNOSIS — Z01810 Encounter for preprocedural cardiovascular examination: Secondary | ICD-10-CM | POA: Diagnosis not present

## 2023-08-22 ENCOUNTER — Telehealth: Payer: Self-pay | Admitting: Nurse Practitioner

## 2023-08-22 NOTE — Telephone Encounter (Signed)
Inbound call from [employer] stating they have not received any information regarding an information request received on 8/22. Request is under Epic media. Please advise, thank you.

## 2023-08-23 NOTE — Telephone Encounter (Signed)
Called [employer] and left a voicemail with the pts casenumber that they need to contact medical records at [phone].

## 2023-08-27 ENCOUNTER — Ambulatory Visit (AMBULATORY_SURGERY_CENTER): Payer: Medicaid Other | Admitting: *Deleted

## 2023-08-27 VITALS — Ht 72.0 in | Wt 218.0 lb

## 2023-08-27 DIAGNOSIS — R142 Eructation: Secondary | ICD-10-CM

## 2023-08-27 DIAGNOSIS — Z86010 Personal history of colonic polyps: Secondary | ICD-10-CM

## 2023-08-27 DIAGNOSIS — Z85038 Personal history of other malignant neoplasm of large intestine: Secondary | ICD-10-CM

## 2023-08-27 MED ORDER — PEG 3350-KCL-NA BICARB-NACL 420 G PO SOLR: 4000.0000 mL | Freq: Once | ORAL | 0 refills | Status: AC

## 2023-08-27 NOTE — Progress Notes (Signed)
Pt's name and DOB verified at the beginning of the pre-visit.  Pt denies any difficulty with ambulating,sitting, laying down or rolling side to side Gave both [facility] main # and MD on call # prior to instructions.  No egg or soy allergy known to patient  No issues known to pt with past sedation with any surgeries or procedures Pt denies having issues being intubated Pt has no issues moving head neck or swallowing No FH of Malignant Hyperthermia Pt is not on diet pills Pt is not on home 02  Pt is not on blood thinners  Pt denies issues with constipation  Pt has frequent issues with constipation RN instructed pt to use Miralax per bottles instructions a week before prep days. Pt states they will Pt is not on dialysis Pt denise any abnormal heart rhythms  Pt denies any upcoming cardiac testing Pt encouraged to use to use Singlecare or Goodrx to reduce cost  Patient's chart reviewed by Cathlyn Parsons CNRA prior to pre-visit and patient appropriate for the [facility].  Pre-visit completed and red dot placed by patient's name on their procedure day (on provider's schedule).  . Visit by phone Pt states weight is 218 lb Instructed pt why it is important to and  to call if they have any changes in health or new medications. Directed them to the # given and on instructions.   Pt states they will.  Instructions reviewed with pt and pt states understanding. Instructed to review again prior to procedure. Pt states they will.  Instructions sent by mail with coupon and by my chart

## 2023-08-28 NOTE — Telephone Encounter (Signed)
Inbound call from [employer] regarding medical records.. transferred the call.

## 2023-09-10 ENCOUNTER — Telehealth: Payer: Self-pay | Admitting: Nurse Practitioner

## 2023-09-10 DIAGNOSIS — Z85038 Personal history of other malignant neoplasm of large intestine: Secondary | ICD-10-CM

## 2023-09-10 MED ORDER — PEG 3350-KCL-NA BICARB-NACL 420 G PO SOLR: 4000.0000 mL | Freq: Once | ORAL | 0 refills | Status: AC

## 2023-09-10 NOTE — Telephone Encounter (Signed)
Prescription sent for Golytely to pt's pharmacy. Attempted 2 calls to pt to notify him and to verify pharmacy. No answer. No VM set up, unable to leave message.

## 2023-09-10 NOTE — Telephone Encounter (Signed)
Made a 3rd attempt to reach pt. Still no answer.

## 2023-09-10 NOTE — Telephone Encounter (Signed)
Inbound call from patient, states [place] pharmacy has not received prep medication, Would like it resent.

## 2023-09-10 NOTE — Telephone Encounter (Signed)
Spoke with pt and he states he has already heard from his pharmacy that they have his prep available for pick up. Pt had no other questions at this time.

## 2023-09-17 ENCOUNTER — Encounter: Payer: Self-pay | Admitting: Gastroenterology

## 2023-09-17 ENCOUNTER — Ambulatory Visit: Payer: Medicaid Other | Admitting: Gastroenterology

## 2023-09-17 VITALS — BP 114/72 | HR 73 | Temp 96.9°F | Resp 17 | Ht 72.0 in | Wt 218.0 lb

## 2023-09-17 DIAGNOSIS — Z85038 Personal history of other malignant neoplasm of large intestine: Secondary | ICD-10-CM | POA: Diagnosis not present

## 2023-09-17 DIAGNOSIS — K219 Gastro-esophageal reflux disease without esophagitis: Secondary | ICD-10-CM

## 2023-09-17 DIAGNOSIS — D123 Benign neoplasm of transverse colon: Secondary | ICD-10-CM

## 2023-09-17 DIAGNOSIS — D127 Benign neoplasm of rectosigmoid junction: Secondary | ICD-10-CM | POA: Diagnosis not present

## 2023-09-17 DIAGNOSIS — Z08 Encounter for follow-up examination after completed treatment for malignant neoplasm: Secondary | ICD-10-CM | POA: Diagnosis not present

## 2023-09-17 MED ORDER — SODIUM CHLORIDE 0.9 % IV SOLN: 500.0000 mL | Freq: Once | INTRAVENOUS | Status: DC

## 2023-09-17 NOTE — Progress Notes (Signed)
GASTROENTEROLOGY PROCEDURE H&P NOTE   Primary Care Physician: Ivonne Andrew, NP  HPI: Kyle Merritt is a 62 y.o. male who presents for Colonoscopy for history of previous colon cancer and surveillance.  Past Medical History:  Diagnosis Date   Allergy    Arthritis    Cataract    R eye   Colon cancer (HCC)    Diabetes mellitus without complication (HCC)    Glaucoma    High cholesterol    History of colon cancer    History of colon cancer 2004   Hypertension    Substance abuse (HCC)    2017 stopped   Past Surgical History:  Procedure Laterality Date   carpel tunnel      08/29/23   COLON SURGERY     KNEE ARTHROSCOPY Bilateral    TOTAL KNEE ARTHROPLASTY Right 11/30/2019   Procedure: RIGHT TOTAL KNEE ARTHROPLASTY;  Surgeon: Gean Birchwood, MD;  Location: [facility];  Service: Orthopedics;  Laterality: Right;   TRIGGER FINGER RELEASE Right 05/22/2019   Current Outpatient Medications  Medication Sig Dispense Refill   Accu-Chek Softclix Lancets lancets Use as directed twice daily. 100 each 5   blood glucose meter kit and supplies KIT Dispense based on patient and insurance preference. Use up to four times daily as directed. (FOR ICD-10  E11.9). 1 each 0   Blood Pressure Monitor DEVI Use to check blood pressure daily 1 each 0   dorzolamide-timolol (COSOPT) 2-0.5 % ophthalmic solution Place 1 drop into the right eye 2 (two) times daily.     glucose blood (ACCU-CHEK GUIDE) test strip Use as instructed twice daily. 100 each 12   lisinopril-hydrochlorothiazide (ZESTORETIC) 20-25 MG tablet Take 1 tablet by mouth daily. 90 tablet 3   metFORMIN (GLUCOPHAGE) 1000 MG tablet Take 1 tablet (1,000 mg total) by mouth daily with breakfast.     ROCKLATAN 0.02-0.005 % SOLN Apply to eye.     atorvastatin (LIPITOR) 40 MG tablet Take 1 tablet (40 mg total) by mouth every evening. 90 tablet 3   docusate sodium (COLACE) 100 MG capsule Take 1 capsule (100 mg total) by mouth 2 (two) times daily. (Patient  not taking: Reported on 08/27/2023) 10 capsule 0   empagliflozin (JARDIANCE) 25 MG TABS tablet Take 1 tablet (25 mg total) by mouth daily before breakfast. 90 tablet 3   Semaglutide,0.25 or 0.5MG /DOS, (OZEMPIC, 0.25 OR 0.5 MG/DOSE,) 2 MG/3ML SOPN Inject 0.5 mg into the skin once a week. 3 mL 2   sildenafil (VIAGRA) 100 MG tablet TAKE 1/2 TABLET BY MOUTH DAILY AS NEEDED FOR ERECTILE DYSFUNCTION 10 tablet 1   Current Facility-Administered Medications  Medication Dose Route Frequency Provider Last Rate Last Admin   0.9 %  sodium chloride infusion  500 mL Intravenous Once Mansouraty, Netty Starring., MD        Current Outpatient Medications:    Accu-Chek Softclix Lancets lancets, Use as directed twice daily., Disp: 100 each, Rfl: 5   blood glucose meter kit and supplies KIT, Dispense based on patient and insurance preference. Use up to four times daily as directed. (FOR ICD-10  E11.9)., Disp: 1 each, Rfl: 0   Blood Pressure Monitor DEVI, Use to check blood pressure daily, Disp: 1 each, Rfl: 0   dorzolamide-timolol (COSOPT) 2-0.5 % ophthalmic solution, Place 1 drop into the right eye 2 (two) times daily., Disp: , Rfl:    glucose blood (ACCU-CHEK GUIDE) test strip, Use as instructed twice daily., Disp: 100 each, Rfl: 12  lisinopril-hydrochlorothiazide (ZESTORETIC) 20-25 MG tablet, Take 1 tablet by mouth daily., Disp: 90 tablet, Rfl: 3   metFORMIN (GLUCOPHAGE) 1000 MG tablet, Take 1 tablet (1,000 mg total) by mouth daily with breakfast., Disp: , Rfl:    ROCKLATAN 0.02-0.005 % SOLN, Apply to eye., Disp: , Rfl:    atorvastatin (LIPITOR) 40 MG tablet, Take 1 tablet (40 mg total) by mouth every evening., Disp: 90 tablet, Rfl: 3   docusate sodium (COLACE) 100 MG capsule, Take 1 capsule (100 mg total) by mouth 2 (two) times daily. (Patient not taking: Reported on 08/27/2023), Disp: 10 capsule, Rfl: 0   empagliflozin (JARDIANCE) 25 MG TABS tablet, Take 1 tablet (25 mg total) by mouth daily before breakfast., Disp:  90 tablet, Rfl: 3   Semaglutide,0.25 or 0.5MG /DOS, (OZEMPIC, 0.25 OR 0.5 MG/DOSE,) 2 MG/3ML SOPN, Inject 0.5 mg into the skin once a week., Disp: 3 mL, Rfl: 2   sildenafil (VIAGRA) 100 MG tablet, TAKE 1/2 TABLET BY MOUTH DAILY AS NEEDED FOR ERECTILE DYSFUNCTION, Disp: 10 tablet, Rfl: 1  Current Facility-Administered Medications:    0.9 %  sodium chloride infusion, 500 mL, Intravenous, Once, Mansouraty, Netty Starring., MD Allergies  Allergen Reactions   Parke Simmers Allergy]     itching   Family History  Problem Relation Age of Onset   Diabetes Mother    Hypertension Mother    Diabetes Sister    Colon cancer Paternal Uncle    Esophageal cancer Neg Hx    Inflammatory bowel disease Neg Hx    Liver disease Neg Hx    Pancreatic cancer Neg Hx    Stomach cancer Neg Hx    Social History   Socioeconomic History   Marital status: Divorced    Spouse name: Not on file   Number of children: 1   Years of education: Not on file   Highest education level: Not on file  Occupational History   Occupation: [employer]  Tobacco Use   Smoking status: Some Days    Current packs/day: 0.00    Types: Cigarettes    Last attempt to quit: 2018    Years since quitting: 6.7   Smokeless tobacco: Never  Vaping Use   Vaping status: Never Used  Substance and Sexual Activity   Alcohol use: No   Drug use: Not Currently    Types: "Crack" cocaine    Comment: stopped in 2017   Sexual activity: Yes  Other Topics Concern   Not on file  Social History Narrative   Not on file   Social Determinants of Health   Financial Resource Strain: Low Risk  (03/25/2023)   Overall Financial Resource Strain (CARDIA)    Difficulty of Paying Living Expenses: Not hard at all  Food Insecurity: Not on file  Transportation Needs: Not on file  Physical Activity: Not on file  Stress: Not on file  Social Connections: Unknown (05/08/2022)   Received from [facility], [facility]   Social Network    Social Network:  Not on file  Intimate Partner Violence: Unknown (03/30/2022)   Received from [facility], [facility]   HITS    Physically Hurt: Not on file    Insult or Talk Down To: Not on file    Threaten Physical Harm: Not on file    Scream or Curse: Not on file    Physical Exam: Today's Vitals   09/17/23 0705 09/17/23 0710  BP: 133/75   Pulse: 84   Temp: (!) 96.9 F (36.1 C) (!)  96.9 F (36.1 C)  TempSrc: Skin   SpO2: 99%   Weight: 218 lb (98.9 kg)   Height: 6' (1.829 m)    Body mass index is 29.57 kg/m. GEN: NAD EYE: Sclerae anicteric ENT: MMM CV: Non-tachycardic GI: Soft, NT/ND NEURO:  Alert & Oriented x 3  Lab Results: No results for input(s): "WBC", "HGB", "HCT", "PLT" in the last 72 hours. BMET No results for input(s): "NA", "K", "CL", "CO2", "GLUCOSE", "BUN", "CREATININE", "CALCIUM" in the last 72 hours. LFT No results for input(s): "PROT", "ALBUMIN", "AST", "ALT", "ALKPHOS", "BILITOT", "BILIDIR", "IBILI" in the last 72 hours. PT/INR No results for input(s): "LABPROT", "INR" in the last 72 hours.   Impression / Plan: This is a 62 y.o.male who presents for Colonoscopy for history of previous colon cancer and surveillance.  The risks and benefits of endoscopic evaluation/treatment were discussed with the patient and/or family; these include but are not limited to the risk of perforation, infection, bleeding, missed lesions, lack of diagnosis, severe illness requiring hospitalization, as well as anesthesia and sedation related illnesses.  The patient's history has been reviewed, patient examined, no change in status, and deemed stable for procedure.  The patient and/or family is agreeable to proceed.    Corliss Parish, MD [facility] Advanced [facility]oscopy Office # [phone]

## 2023-09-17 NOTE — Progress Notes (Signed)
Pt's states no medical or surgical changes since previsit or office visit. 

## 2023-09-17 NOTE — Progress Notes (Signed)
Report to PACU, RN, vss, BBS= Clear.

## 2023-09-17 NOTE — Op Note (Addendum)
[facility] Patient Name: Kyle Merritt Procedure Date: 09/17/2023 8:15 AM MRN: [id number] Endoscopist: Corliss Parish , MD, [id number] Age: 62 Referring MD:  Date of Birth: 29-Jun-1961 Gender: Male Account #: [id number] Procedure:                Colonoscopy Indications:              High risk colon cancer surveillance: Personal                            history of colon cancer Medicines:                Monitored Anesthesia Care Procedure:                Pre-Anesthesia Assessment:                           - Prior to the procedure, a History and Physical                            was performed, and patient medications and                            allergies were reviewed. The patient's tolerance of                            previous anesthesia was also reviewed. The risks                            and benefits of the procedure and the sedation                            options and risks were discussed with the patient.                            All questions were answered, and informed consent                            was obtained. Prior Anticoagulants: The patient has                            taken no anticoagulant or antiplatelet agents. ASA                            Grade Assessment: II - A patient with mild systemic                            disease. After reviewing the risks and benefits,                            the patient was deemed in satisfactory condition to                            undergo the procedure.  After obtaining informed consent, the colonoscope                            was passed under direct vision. Throughout the                            procedure, the patient's blood pressure, pulse, and                            oxygen saturations were monitored continuously. The                            CF [id number] #[id number] was introduced through the anus                            and advanced to the 3 cm into the  ileum. The                            colonoscopy was performed without difficulty. The                            patient tolerated the procedure. The quality of the                            bowel preparation was adequate. The terminal ileum,                            ileocecal valve, appendiceal orifice, and rectum                            were photographed. Scope In: 8:19:21 AM Scope Out: 8:30:52 AM Scope Withdrawal Time: 0 hours 8 minutes 49 seconds  Total Procedure Duration: 0 hours 11 minutes 31 seconds  Findings:                 The digital rectal exam was normal. Pertinent                            negatives include no palpable rectal lesions.                           The terminal ileum and ileocecal valve appeared                            normal.                           Three sessile polyps were found in the                            recto-sigmoid colon and transverse colon. The                            polyps were 2 to 4 mm in size. These polyps were  removed with a cold snare. Resection and retrieval                            were complete.                           Multiple medium-mouthed and small-mouthed                            diverticula were found in the entire colon.                           Normal mucosa was found in the entire colon                            otherwise.                           Non-bleeding non-thrombosed internal hemorrhoids                            were found during retroflexion. The hemorrhoids                            were Grade I (internal hemorrhoids that do not                            prolapse). Complications:            No immediate complications. Estimated Blood Loss:     Estimated blood loss was minimal. Impression:               - The examined portion of the ileum was normal.                           - Three 2 to 4 mm polyps at the recto-sigmoid colon                            and in  the transverse colon, removed with a cold                            snare. Resected and retrieved.                           - Diverticulosis in the entire examined colon.                           - Normal mucosa in the entire examined colon                            otherwise.                           - Non-bleeding non-thrombosed internal hemorrhoids. Recommendation:           - The patient will be observed post-procedure,  until all discharge criteria are met.                           - Discharge patient to home.                           - Patient has a contact number available for                            emergencies. The signs and symptoms of potential                            delayed complications were discussed with the                            patient. Return to normal activities tomorrow.                            Written discharge instructions were provided to the                            patient.                           - High fiber diet.                           - Use FiberCon 1-2 tablets PO daily.                           - Continue present medications.                           - Await pathology results.                           - Repeat colonoscopy in 3 - 5 years for                            surveillance based on pathology results.                           - Patient has been having progressive GERD symptoms                            while on PPI therapy, an EGD will be scheduled at                            his convenience.                           - The findings and recommendations were discussed                            with the patient. Corliss Parish, MD 09/17/2023 8:34:10 AM

## 2023-09-17 NOTE — Patient Instructions (Addendum)
Resume previous diet Continue present medications Await pathology results  Handouts/information given for polyps, diverticulosis, high fiber diet and hemorrhoids  YOU HAD AN ENDOSCOPIC PROCEDURE TODAY AT THE [facility]:   Refer to the procedure report that was given to you for any specific questions about what was found during the examination.  If the procedure report does not answer your questions, please call your gastroenterologist to clarify.  If you requested that your care partner not be given the details of your procedure findings, then the procedure report has been included in a sealed envelope for you to review at your convenience later.  YOU SHOULD EXPECT: Some feelings of bloating in the abdomen. Passage of more gas than usual.  Walking can help get rid of the air that was put into your GI tract during the procedure and reduce the bloating. If you had a lower endoscopy (such as a colonoscopy or flexible sigmoidoscopy) you may notice spotting of blood in your stool or on the toilet paper. If you underwent a bowel prep for your procedure, you may not have a normal bowel movement for a few days.  Please Note:  You might notice some irritation and congestion in your nose or some drainage.  This is from the oxygen used during your procedure.  There is no need for concern and it should clear up in a day or so.  SYMPTOMS TO REPORT IMMEDIATELY:  Following lower endoscopy (colonoscopy):  Excessive amounts of blood in the stool  Significant tenderness or worsening of abdominal pains  Swelling of the abdomen that is new, acute  Fever of 100F or higher  For urgent or emergent issues, a gastroenterologist can be reached at any hour by calling [phone]. Do not use MyChart messaging for urgent concerns.    DIET:  We do recommend a small meal at first, but then you may proceed to your regular diet.  Drink plenty of fluids but you should avoid alcoholic beverages for 24  hours.  ACTIVITY:  You should plan to take it easy for the rest of today and you should NOT DRIVE or use heavy machinery until tomorrow (because of the sedation medicines used during the test).    FOLLOW UP: Our staff will call the number listed on your records the next business day following your procedure.  We will call around 7:15- 8:00 am to check on you and address any questions or concerns that you may have regarding the information given to you following your procedure. If we do not reach you, we will leave a message.     If any biopsies were taken you will be contacted by phone or by letter within the next 1-3 weeks.  Please call us at [phone] if you have not heard about the biopsies in 3 weeks.    SIGNATURES/CONFIDENTIALITY: You and/or your care partner have signed paperwork which will be entered into your electronic medical record.  These signatures attest to the fact that that the information above on your After Visit Summary has been reviewed and is understood.  Full responsibility of the confidentiality of this discharge information lies with you and/or your care-partner.

## 2023-09-17 NOTE — Progress Notes (Signed)
Called to room to assist during endoscopic procedure.  Patient ID and intended procedure confirmed with present staff. Received instructions for my participation in the procedure from the performing physician.  

## 2023-09-18 ENCOUNTER — Telehealth: Payer: Self-pay

## 2023-09-18 NOTE — Telephone Encounter (Signed)
  Follow up Call-     09/17/2023    7:10 AM  Call back number  Post procedure Call Back phone  # [phone]  Permission to leave phone message Yes     Patient questions:  Do you have a fever, pain , or abdominal swelling? No. Pain Score  0 *  Have you tolerated food without any problems? Yes.    Have you been able to return to your normal activities? Yes.    Do you have any questions about your discharge instructions: Diet   No. Medications  No. Follow up visit  No.  Do you have questions or concerns about your Care? No.  Actions: * If pain score is 4 or above: No action needed, pain <4.

## 2023-09-19 LAB — SURGICAL PATHOLOGY

## 2023-09-20 ENCOUNTER — Encounter: Payer: Self-pay | Admitting: Gastroenterology

## 2023-10-07 ENCOUNTER — Other Ambulatory Visit: Payer: Self-pay | Admitting: Nurse Practitioner

## 2023-10-07 NOTE — Telephone Encounter (Signed)
Please advise KH 

## 2023-10-28 ENCOUNTER — Encounter: Payer: Self-pay | Admitting: Internal Medicine

## 2023-10-28 ENCOUNTER — Ambulatory Visit: Payer: Medicaid Other | Attending: Internal Medicine | Admitting: Internal Medicine

## 2023-10-28 VITALS — BP 112/72 | HR 86 | Ht 72.0 in | Wt 218.0 lb

## 2023-10-28 DIAGNOSIS — E119 Type 2 diabetes mellitus without complications: Secondary | ICD-10-CM

## 2023-10-28 DIAGNOSIS — I10 Essential (primary) hypertension: Secondary | ICD-10-CM | POA: Diagnosis not present

## 2023-10-28 DIAGNOSIS — E782 Mixed hyperlipidemia: Secondary | ICD-10-CM

## 2023-10-28 DIAGNOSIS — I493 Ventricular premature depolarization: Secondary | ICD-10-CM | POA: Diagnosis not present

## 2023-10-28 NOTE — Progress Notes (Unsigned)
OFFICE CONSULT NOTE  Chief Complaint:  Follow-up  Primary Care Physician: Ivonne Andrew, NP  HPI:  Mieczyslaw Stamas is a 62 y.o. male who is being seen today for the evaluation of irregular heartbeat at the request of Ivonne Andrew, NP.  Mr. Lorentz is a 62 year old male who currently is referred today by Thad Ranger, NP.  In fact he was seen in the office this morning and noted to have an irregular heart rhythm.  There was a cancellation in the schedule today and he was sent over to the office.  Mr. Campoy is completely asymptomatic.  He was not aware that he had any irregular rhythms.  An EKG there was not performed however we did get an EKG here which shows sinus rhythm with PVCs.  It was also noted that he had missed beats on physical exam.  The EKG shows a pattern of bigeminy.  He denies any chest pain or shortness of breath.  He reports his father had a pacemaker.  He also had a brother with an enlarged heart.  Other medical problems include type 2 diabetes, hypertension, aortic atherosclerosis, and dyslipidemia.  05/10/2021  Mr. Tolbert returns today for follow-up.  He was noted to have frequent PVCs at his last visit however today they are not present.  He says he thinks that they have gone away.  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  He is also made some changes in his diet.  His A1c is improved from 7.2-6 5%.  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  Pressure appears well controlled.  He denies shortness of breath or chest pain.  10/28/2023  Delman returns today for follow-up.  He seems to be doing well.  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  Well-controlled.  His cholesterol in January showed total 159, HDL 46, triglycerides 140 and LDL 88.  Hemoglobin A1c in August was 6.7%.  PMHx:  Past Medical History:  Diagnosis  Date   Allergy    Arthritis    Cataract    R eye   Colon cancer (HCC)    Diabetes mellitus without complication (HCC)    Glaucoma    High cholesterol    History of colon cancer    History of colon cancer 2004   Hypertension    Substance abuse (HCC)    2017 stopped    Past Surgical History:  Procedure Laterality Date   carpel tunnel      08/29/23   COLON SURGERY     KNEE ARTHROSCOPY Bilateral    TOTAL KNEE ARTHROPLASTY Right 11/30/2019   Procedure: RIGHT TOTAL KNEE ARTHROPLASTY;  Surgeon: Gean Birchwood, MD;  Location: [facility];  Service: Orthopedics;  Laterality: Right;   TRIGGER FINGER RELEASE Right 05/22/2019    FAMHx:  Family History  Problem Relation Age of Onset   Diabetes Mother    Hypertension Mother    Diabetes Sister    Colon cancer Paternal Uncle    Esophageal cancer Neg Hx    Inflammatory bowel disease Neg Hx    Liver disease Neg Hx    Pancreatic cancer Neg Hx    Stomach cancer Neg Hx     SOCHx:   reports that he has been smoking cigarettes. He has never used smokeless tobacco. He reports that he  does not currently use drugs after having used the following drugs: "Crack" cocaine. He reports that he does not drink alcohol.  ALLERGIES:  Allergies  Allergen Reactions   Crab [Shellfish Allergy]     itching    ROS: Pertinent items noted in HPI and remainder of comprehensive ROS otherwise negative.  HOME MEDS: Current Outpatient Medications on File Prior to Visit  Medication Sig Dispense Refill   Accu-Chek Softclix Lancets lancets Use as directed twice daily. 100 each 5   atorvastatin (LIPITOR) 40 MG tablet Take 1 tablet (40 mg total) by mouth every evening. 90 tablet 3   blood glucose meter kit and supplies KIT Dispense based on patient and insurance preference. Use up to four times daily as directed. (FOR ICD-10  E11.9). 1 each 0   Blood Pressure Monitor DEVI Use to check blood pressure daily 1 each 0   empagliflozin (JARDIANCE) 25 MG TABS tablet Take 1  tablet (25 mg total) by mouth daily before breakfast. 90 tablet 3   glucose blood (ACCU-CHEK GUIDE) test strip Use as instructed twice daily. 100 each 12   lisinopril-hydrochlorothiazide (ZESTORETIC) 20-25 MG tablet Take 1 tablet by mouth daily. 90 tablet 3   metFORMIN (GLUCOPHAGE) 1000 MG tablet Take 1 tablet (1,000 mg total) by mouth daily with breakfast.     ROCKLATAN 0.02-0.005 % SOLN Apply to eye.     Semaglutide,0.25 or 0.5MG /DOS, (OZEMPIC, 0.25 OR 0.5 MG/DOSE,) 2 MG/3ML SOPN Inject 0.5 mg into the skin once a week. 3 mL 2   sildenafil (VIAGRA) 100 MG tablet TAKE 1/2 TABLET BY MOUTH DAILY AS NEEDED FOR ERECTILE DYSFUNCTION 10 tablet 1   docusate sodium (COLACE) 100 MG capsule Take 1 capsule (100 mg total) by mouth 2 (two) times daily. (Patient not taking: Reported on 10/28/2023) 10 capsule 0   dorzolamide-timolol (COSOPT) 2-0.5 % ophthalmic solution Place 1 drop into the right eye 2 (two) times daily. (Patient not taking: Reported on 10/28/2023)     No current facility-administered medications on file prior to visit.    LABS/IMAGING: No results found for this or any previous visit (from the past 48 hour(s)). No results found.  LIPID PANEL:    Component Value Date/Time   CHOL 159 01/21/2023 1008   TRIG 140 01/21/2023 1008   HDL 46 01/21/2023 1008   CHOLHDL 3.5 01/21/2023 1008   CHOLHDL 4.0 08/02/2014 1151   VLDL 33 08/02/2014 1151   LDLCALC 88 01/21/2023 1008    WEIGHTS: Wt Readings from Last 3 Encounters:  10/28/23 218 lb (98.9 kg)  09/17/23 218 lb (98.9 kg)  08/27/23 218 lb (98.9 kg)    VITALS: BP 112/72   Pulse 86   Ht 6' (1.829 m)   Wt 218 lb (98.9 kg)   SpO2 100%   BMI 29.57 kg/m   EXAM: General appearance: alert and no distress Neck: no carotid bruit, no JVD and thyroid not enlarged, symmetric, no tenderness/mass/nodules Lungs: clear to auscultation bilaterally Heart: regular rate and rhythm, S1, S2 normal, no murmur, click, rub or gallop Abdomen: soft,  non-tender; bowel sounds normal; no masses,  no organomegaly Extremities: extremities normal, atraumatic, no cyanosis or edema Pulses: 2+ and symmetric Skin: Skin color, texture, turgor normal. No rashes or lesions Neurologic: Grossly normal Psych: Pleasant  EKG: EKG Interpretation Date/Time:  Monday October 28 2023 08:04:27 EST Ventricular Rate:  86 PR Interval:  190 QRS Duration:  100 QT Interval:  362 QTC Calculation: 433 R Axis:   -6  Text Interpretation:  Sinus rhythm with occasional Premature ventricular complexes Minimal voltage criteria for LVH, may be normal variant ( Cornell product ) Anterior infarct , age undetermined When compared with ECG of 18-Aug-2020 10:36, No significant change was found Confirmed by Zoila Shutter [phone]) on 10/28/2023 8:16:50 AM    ASSESSMENT: Frequent PVCs in a bigeminal pattern - resolved Type 2 diabetes - 6.7% Hypertension Dyslipidemia  PLAN: 1.   Mr. Sheller was again noted to have some PVCs on his EKG.  He seems to be asymptomatic with this.  He denies any worsening shortness of breath.  His A1c is up slightly.  Blood pressure is well-controlled.  Cholesterol is slightly above target LDL less than 70.  No changes to his medications at this time.  I encouraged to continue to work with diet and lifestyle modification.  I think he could follow-up with [place] in 2 years or sooner as necessary.  Chrystie Nose, MD, [facility], FACP  [place]  [facility]  Medical Director of the Advanced Lipid Disorders &  Cardiovascular Risk Reduction Clinic Diplomate of the American Board of Clinical Lipidology Attending Cardiologist  Direct Dial: [phone]  Fax: [phone]  Website:  [email or web address] Nicely Atlantis Delong 10/28/2023, 8:17 AM

## 2023-10-28 NOTE — Patient Instructions (Signed)
Medication Instructions:  NO CHANGES  *If you need a refill on your cardiac medications before your next appointment, please call your pharmacy*   Follow-Up: At [facility], you and your health needs are our priority.  As part of our continuing mission to provide you with exceptional heart care, we have created designated Provider Care Teams.  These Care Teams include your primary Cardiologist (physician) and Advanced Practice Providers (APPs -  Physician Assistants and Nurse Practitioners) who all work together to provide you with the care you need, when you need it.  We recommend signing up for the patient portal called "MyChart".  Sign up information is provided on this After Visit Summary.  MyChart is used to connect with patients for Virtual Visits (Telemedicine).  Patients are able to view lab/test results, encounter notes, upcoming appointments, etc.  Non-urgent messages can be sent to your provider as well.   To learn more about what you can do with MyChart, go to [email or web address].    Your next appointment:    TWO YEARS with Dr. Rennis Golden

## 2023-11-01 ENCOUNTER — Ambulatory Visit: Payer: Medicaid Other | Admitting: Nurse Practitioner

## 2023-11-01 ENCOUNTER — Encounter: Payer: Self-pay | Admitting: Nurse Practitioner

## 2023-11-01 VITALS — BP 127/79 | HR 88 | Ht 72.0 in | Wt 215.2 lb

## 2023-11-01 DIAGNOSIS — E1169 Type 2 diabetes mellitus with other specified complication: Secondary | ICD-10-CM

## 2023-11-01 LAB — POCT GLYCOSYLATED HEMOGLOBIN (HGB A1C): HbA1c, POC (controlled diabetic range): 6.3 % (ref 0.0–7.0)

## 2023-11-01 NOTE — Patient Instructions (Signed)
1. Type 2 diabetes mellitus with other specified complication, without long-term current use of insulin (HCC)  - POCT glycosylated hemoglobin (Hb A1C)  Follow up:  Follow up in 3 months

## 2023-11-01 NOTE — Progress Notes (Signed)
Subjective   Patient ID: Kyle Merritt, male    DOB: 08-Jul-1961, 62 y.o.   MRN: [id number]  Chief Complaint  Patient presents with   Medical Management of Chronic Issues    Referring provider: Ivonne Andrew, NP  Brelon Venteicher is a 62 y.o. male with Past Medical History: No date: Allergy No date: Arthritis No date: Cataract     Comment:  R eye No date: Colon cancer (HCC) No date: Diabetes mellitus without complication (HCC) No date: Glaucoma No date: High cholesterol No date: History of colon cancer 2004: History of colon cancer No date: Hypertension No date: Substance abuse [facility])     Comment:  2017 stopped   HPI  Patient presents for follow up of diabetes. Current symptoms include: hyperglycemia and weight gain. Patient denies foot ulcerations, hypoglycemia , increased appetite, nausea, paresthesia of the feet, polydipsia and polyuria. Evaluation to date has included: fasting blood sugar, fasting lipid panel, hemoglobin A1C and microalbuminuria.  Home sugars: not checking. Current treatment: more intensive attention to diet which has not been effective, Continued metformin, jardiance, and ozempic which has been effective, Continued statin which has been effective and Continued ACE inhibitor/ARB which has been effective. A1C is 6.3 today.   Patient is not checking home blood sugars.   Home blood sugar records: patient does not check sugars How often is blood sugars being checked:  Current symptoms/problems include none and have been worsening.    Allergies  Allergen Reactions   Crab [Shellfish Allergy]     itching    Immunization History  Administered Date(s) Administered   Influenza Whole 12/08/2008, 02/03/2010   Influenza,inj,Quad PF,6+ Mos 10/17/2018, 08/26/2019, 12/12/2020, 10/19/2022   Pneumococcal Polysaccharide-23 03/24/2009, 01/29/2018   Td 12/08/2008   Tdap 01/29/2018    Tobacco History: Social History   Tobacco Use  Smoking Status Some Days    Current packs/day: 0.00   Types: Cigarettes   Last attempt to quit: 2018   Years since quitting: 6.8  Smokeless Tobacco Never   Ready to quit: Not Answered Counseling given: Not Answered   Outpatient Encounter Medications as of 11/01/2023  Medication Sig   Accu-Chek Softclix Lancets lancets Use as directed twice daily.   atorvastatin (LIPITOR) 40 MG tablet Take 1 tablet (40 mg total) by mouth every evening.   blood glucose meter kit and supplies KIT Dispense based on patient and insurance preference. Use up to four times daily as directed. (FOR ICD-10  E11.9).   Blood Pressure Monitor DEVI Use to check blood pressure daily   empagliflozin (JARDIANCE) 25 MG TABS tablet Take 1 tablet (25 mg total) by mouth daily before breakfast.   glucose blood (ACCU-CHEK GUIDE) test strip Use as instructed twice daily.   lisinopril-hydrochlorothiazide (ZESTORETIC) 20-25 MG tablet Take 1 tablet by mouth daily.   metFORMIN (GLUCOPHAGE) 1000 MG tablet Take 1 tablet (1,000 mg total) by mouth daily with breakfast.   ROCKLATAN 0.02-0.005 % SOLN Apply to eye.   Semaglutide,0.25 or 0.5MG /DOS, (OZEMPIC, 0.25 OR 0.5 MG/DOSE,) 2 MG/3ML SOPN Inject 0.5 mg into the skin once a week.   sildenafil (VIAGRA) 100 MG tablet TAKE 1/2 TABLET BY MOUTH DAILY AS NEEDED FOR ERECTILE DYSFUNCTION   docusate sodium (COLACE) 100 MG capsule Take 1 capsule (100 mg total) by mouth 2 (two) times daily. (Patient not taking: Reported on 10/28/2023)   dorzolamide-timolol (COSOPT) 2-0.5 % ophthalmic solution Place 1 drop into the right eye 2 (two) times daily. (Patient not taking: Reported on 10/28/2023)  No facility-administered encounter medications on file as of 11/01/2023.    Review of Systems  Review of Systems  Constitutional: Negative.   HENT: Negative.    Cardiovascular: Negative.   Gastrointestinal: Negative.   Allergic/Immunologic: Negative.   Neurological: Negative.   Psychiatric/Behavioral: Negative.       Objective:    BP 127/79   Pulse 88   Ht 6' (1.829 m)   Wt 215 lb 3.2 oz (97.6 kg)   SpO2 100%   BMI 29.19 kg/m   Wt Readings from Last 5 Encounters:  11/01/23 215 lb 3.2 oz (97.6 kg)  10/28/23 218 lb (98.9 kg)  09/17/23 218 lb (98.9 kg)  08/27/23 218 lb (98.9 kg)  07/25/23 215 lb 6.4 oz (97.7 kg)     Physical Exam Vitals and nursing note reviewed.  Constitutional:      General: He is not in acute distress.    Appearance: He is well-developed.  Cardiovascular:     Rate and Rhythm: Normal rate and regular rhythm.  Pulmonary:     Effort: Pulmonary effort is normal.     Breath sounds: Normal breath sounds.  Skin:    General: Skin is warm and dry.  Neurological:     Mental Status: He is alert and oriented to person, place, and time.       Assessment & Plan:   Type 2 diabetes mellitus with other specified complication, without long-term current use of insulin (HCC) -     POCT glycosylated hemoglobin (Hb A1C)     Return in about 3 months (around 02/01/2024).   Ivonne Andrew, NP 11/01/2023

## 2023-11-05 ENCOUNTER — Encounter: Payer: Self-pay | Admitting: Podiatry

## 2023-11-05 ENCOUNTER — Ambulatory Visit (INDEPENDENT_AMBULATORY_CARE_PROVIDER_SITE_OTHER): Payer: Medicaid Other | Admitting: Podiatry

## 2023-11-05 DIAGNOSIS — B351 Tinea unguium: Secondary | ICD-10-CM

## 2023-11-05 DIAGNOSIS — E1169 Type 2 diabetes mellitus with other specified complication: Secondary | ICD-10-CM | POA: Diagnosis not present

## 2023-11-05 DIAGNOSIS — M79609 Pain in unspecified limb: Secondary | ICD-10-CM | POA: Diagnosis not present

## 2023-11-05 NOTE — Progress Notes (Signed)
  Subjective:  Patient ID: Kyle Merritt, male    DOB: 1961/10/04,  MRN: [id number]  62 y.o. male presents preventative diabetic foot care and painful thick toenails that are difficult to trim. Pain interferes with ambulation. Aggravating factors include wearing enclosed shoe gear. Pain is relieved with periodic professional debridement.  Chief Complaint  Patient presents with   Diabetes    DFC A1C :63 , PATIENT STATES HE SEEN HIS PCP LAST FRIDAY , BS:63.    New problem(s): None   PCP is Ivonne Andrew, NP , and last visit was November 01, 2023.  Allergies  Allergen Reactions   Crab [Shellfish Allergy]     itching    Review of Systems: Negative except as noted in the HPI.   Objective:  Kyle Merritt is a pleasant 62 y.o. male WD, WN in NAD.[name] AAO x 3.  Vascular Examination: Vascular status intact b/l with palpable pedal pulses. CFT immediate b/l. Pedal hair present. No edema. No pain with calf compression b/l. Skin temperature gradient WNL b/l. No varicosities noted. No cyanosis or clubbing noted.  Neurological Examination: Sensation grossly intact b/l with 10 gram monofilament. Vibratory sensation intact b/l.  Dermatological Examination: Pedal skin with normal turgor, texture and tone b/l. No open wounds nor interdigital macerations noted. Toenails 1-5 b/l thick, discolored, elongated with subungual debris and pain on dorsal palpation. No hyperkeratotic lesions noted b/l.   Musculoskeletal Examination: Muscle strength 5/5 to b/l LE.  No pain, crepitus noted b/l. No gross pedal deformities. Patient ambulates independently without assistive aids.   Radiographs: None Last A1c:      Latest Ref Rng & Units 11/01/2023    9:58 AM 07/25/2023    9:20 AM 04/24/2023   10:20 AM 01/21/2023   10:08 AM 01/21/2023    9:57 AM  Hemoglobin A1C  Hemoglobin-A1c 0.0 - 7.0 % 6.3  6.7  8.3  9.1  9.1    9.1    9.1    9.1      Assessment:   1. Pain due to onychomycosis of nail   2. Type 2  diabetes mellitus with other specified complication, without long-term current use of insulin (HCC)    Plan:  Patient was evaluated and treated. All patient's and/or POA's questions/concerns addressed on today's visit. Toenails 1-5 debrided in length and girth without incident. Continue soft, supportive shoe gear daily. Report any pedal injuries to medical professional. Call office if there are any questions/concerns. -Continue foot and shoe inspections daily. Monitor blood glucose per PCP/Endocrinologist's recommendations. -Patient/POA to call should there be question/concern in the interim.  Return in about 3 months (around 02/05/2024).  Freddie Breech, DPM

## 2023-12-03 ENCOUNTER — Encounter: Payer: Self-pay | Admitting: Gastroenterology

## 2023-12-03 ENCOUNTER — Ambulatory Visit: Payer: Medicaid Other | Admitting: Gastroenterology

## 2023-12-03 VITALS — BP 101/49 | HR 76 | Temp 97.3°F | Resp 20 | Ht 72.0 in | Wt 218.0 lb

## 2023-12-03 DIAGNOSIS — K2289 Other specified disease of esophagus: Secondary | ICD-10-CM | POA: Diagnosis not present

## 2023-12-03 DIAGNOSIS — K2950 Unspecified chronic gastritis without bleeding: Secondary | ICD-10-CM

## 2023-12-03 DIAGNOSIS — K219 Gastro-esophageal reflux disease without esophagitis: Secondary | ICD-10-CM | POA: Diagnosis not present

## 2023-12-03 DIAGNOSIS — K31A15 Gastric intestinal metaplasia without dysplasia, involving multiple sites: Secondary | ICD-10-CM

## 2023-12-03 DIAGNOSIS — R142 Eructation: Secondary | ICD-10-CM

## 2023-12-03 MED ORDER — SODIUM CHLORIDE 0.9 % IV SOLN: 500.0000 mL | INTRAVENOUS | Status: DC

## 2023-12-03 MED ORDER — ESOMEPRAZOLE MAGNESIUM 40 MG PO CPDR: 40.0000 mg | DELAYED_RELEASE_CAPSULE | Freq: Two times a day (BID) | ORAL | 11 refills | Status: DC

## 2023-12-03 NOTE — Progress Notes (Signed)
Called to room to assist during endoscopic procedure.  Patient ID and intended procedure confirmed with present staff. Received instructions for my participation in the procedure from the performing physician.  

## 2023-12-03 NOTE — Progress Notes (Signed)
Patient states there have been no changes to medical or surgical history since time of pre-visit. 

## 2023-12-03 NOTE — Progress Notes (Signed)
Vss nad trans to pacu 

## 2023-12-03 NOTE — Patient Instructions (Signed)
Discharge instructions given. Handout on Gastritis. Prescription sent to pharmacy. Resume previous medications. YOU HAD AN ENDOSCOPIC PROCEDURE TODAY AT [facility]:   Refer to the procedure report that was given to you for any specific questions about what was found during the examination.  If the procedure report does not answer your questions, please call your gastroenterologist to clarify.  If you requested that your care partner not be given the details of your procedure findings, then the procedure report has been included in a sealed envelope for you to review at your convenience later.  YOU SHOULD EXPECT: Some feelings of bloating in the abdomen. Passage of more gas than usual.  Walking can help get rid of the air that was put into your GI tract during the procedure and reduce the bloating. If you had a lower endoscopy (such as a colonoscopy or flexible sigmoidoscopy) you may notice spotting of blood in your stool or on the toilet paper. If you underwent a bowel prep for your procedure, you may not have a normal bowel movement for a few days.  Please Note:  You might notice some irritation and congestion in your nose or some drainage.  This is from the oxygen used during your procedure.  There is no need for concern and it should clear up in a day or so.  SYMPTOMS TO REPORT IMMEDIATELY:   Following upper endoscopy (EGD)  Vomiting of blood or coffee ground material  New chest pain or pain under the shoulder blades  Painful or persistently difficult swallowing  New shortness of breath  Fever of 100F or higher  Black, tarry-looking stools  For urgent or emergent issues, a gastroenterologist can be reached at any hour by calling [phone]. Do not use MyChart messaging for urgent concerns.    DIET:  We do recommend a small meal at first, but then you may proceed to your regular diet.  Drink plenty of fluids but you should avoid alcoholic beverages for 24  hours.  ACTIVITY:  You should plan to take it easy for the rest of today and you should NOT DRIVE or use heavy machinery until tomorrow (because of the sedation medicines used during the test).    FOLLOW UP: Our staff will call the number listed on your records the next business day following your procedure.  We will call around 7:15- 8:00 am to check on you and address any questions or concerns that you may have regarding the information given to you following your procedure. If we do not reach you, we will leave a message.     If any biopsies were taken you will be contacted by phone or by letter within the next 1-3 weeks.  Please call us at [phone] if you have not heard about the biopsies in 3 weeks.    SIGNATURES/CONFIDENTIALITY: You and/or your care partner have signed paperwork which will be entered into your electronic medical record.  These signatures attest to the fact that that the information above on your After Visit Summary has been reviewed and is understood.  Full responsibility of the confidentiality of this discharge information lies with you and/or your care-partner.

## 2023-12-03 NOTE — Progress Notes (Signed)
GASTROENTEROLOGY PROCEDURE H&P NOTE   Primary Care Physician: Ivonne Andrew, NP  HPI: Kyle Merritt is a 62 y.o. male who presents for EGD for evaluation of GERD on PPI therapy and belching/bloating/eructation.  Past Medical History:  Diagnosis Date   Allergy    Arthritis    Cataract    R eye   Colon cancer (HCC)    Diabetes mellitus without complication (HCC)    Glaucoma    High cholesterol    History of colon cancer    History of colon cancer 2004   Hypertension    Substance abuse (HCC)    2017 stopped   Past Surgical History:  Procedure Laterality Date   carpel tunnel      08/29/23   COLON SURGERY     KNEE ARTHROSCOPY Bilateral    TOTAL KNEE ARTHROPLASTY Right 11/30/2019   Procedure: RIGHT TOTAL KNEE ARTHROPLASTY;  Surgeon: Gean Birchwood, MD;  Location: [facility];  Service: Orthopedics;  Laterality: Right;   TRIGGER FINGER RELEASE Right 05/22/2019   Current Outpatient Medications  Medication Sig Dispense Refill   Accu-Chek Softclix Lancets lancets Use as directed twice daily. 100 each 5   atorvastatin (LIPITOR) 40 MG tablet Take 1 tablet (40 mg total) by mouth every evening. 90 tablet 3   blood glucose meter kit and supplies KIT Dispense based on patient and insurance preference. Use up to four times daily as directed. (FOR ICD-10  E11.9). 1 each 0   Blood Pressure Monitor DEVI Use to check blood pressure daily 1 each 0   dorzolamide-timolol (COSOPT) 2-0.5 % ophthalmic solution Place 1 drop into the right eye 2 (two) times daily.     empagliflozin (JARDIANCE) 25 MG TABS tablet Take 1 tablet (25 mg total) by mouth daily before breakfast. 90 tablet 3   glucose blood (ACCU-CHEK GUIDE) test strip Use as instructed twice daily. 100 each 12   lisinopril-hydrochlorothiazide (ZESTORETIC) 20-25 MG tablet Take 1 tablet by mouth daily. 90 tablet 3   metFORMIN (GLUCOPHAGE) 1000 MG tablet Take 1 tablet (1,000 mg total) by mouth daily with breakfast.     sildenafil (VIAGRA) 100 MG  tablet TAKE 1/2 TABLET BY MOUTH DAILY AS NEEDED FOR ERECTILE DYSFUNCTION 10 tablet 1   Current Facility-Administered Medications  Medication Dose Route Frequency Provider Last Rate Last Admin   0.9 %  sodium chloride infusion  500 mL Intravenous Continuous Mansouraty, Netty Starring., MD        Current Outpatient Medications:    Accu-Chek Softclix Lancets lancets, Use as directed twice daily., Disp: 100 each, Rfl: 5   atorvastatin (LIPITOR) 40 MG tablet, Take 1 tablet (40 mg total) by mouth every evening., Disp: 90 tablet, Rfl: 3   blood glucose meter kit and supplies KIT, Dispense based on patient and insurance preference. Use up to four times daily as directed. (FOR ICD-10  E11.9)., Disp: 1 each, Rfl: 0   Blood Pressure Monitor DEVI, Use to check blood pressure daily, Disp: 1 each, Rfl: 0   dorzolamide-timolol (COSOPT) 2-0.5 % ophthalmic solution, Place 1 drop into the right eye 2 (two) times daily., Disp: , Rfl:    empagliflozin (JARDIANCE) 25 MG TABS tablet, Take 1 tablet (25 mg total) by mouth daily before breakfast., Disp: 90 tablet, Rfl: 3   glucose blood (ACCU-CHEK GUIDE) test strip, Use as instructed twice daily., Disp: 100 each, Rfl: 12   lisinopril-hydrochlorothiazide (ZESTORETIC) 20-25 MG tablet, Take 1 tablet by mouth daily., Disp: 90 tablet, Rfl: 3  metFORMIN (GLUCOPHAGE) 1000 MG tablet, Take 1 tablet (1,000 mg total) by mouth daily with breakfast., Disp: , Rfl:    sildenafil (VIAGRA) 100 MG tablet, TAKE 1/2 TABLET BY MOUTH DAILY AS NEEDED FOR ERECTILE DYSFUNCTION, Disp: 10 tablet, Rfl: 1  Current Facility-Administered Medications:    0.9 %  sodium chloride infusion, 500 mL, Intravenous, Continuous, Mansouraty, Netty Starring., MD Allergies  Allergen Reactions   Parke Simmers Allergy]     itching   Family History  Problem Relation Age of Onset   Diabetes Mother    Hypertension Mother    Diabetes Sister    Colon cancer Paternal Uncle    Esophageal cancer Neg Hx     Inflammatory bowel disease Neg Hx    Liver disease Neg Hx    Pancreatic cancer Neg Hx    Stomach cancer Neg Hx    Colon polyps Neg Hx    Rectal cancer Neg Hx    Social History   Socioeconomic History   Marital status: Divorced    Spouse name: Not on file   Number of children: 1   Years of education: Not on file   Highest education level: Not on file  Occupational History   Occupation: [employer]  Tobacco Use   Smoking status: Some Days    Current packs/day: 0.00    Types: Cigarettes    Last attempt to quit: 2018    Years since quitting: 6.9   Smokeless tobacco: Never  Vaping Use   Vaping status: Never Used  Substance and Sexual Activity   Alcohol use: No   Drug use: Not Currently    Types: "Crack" cocaine    Comment: stopped in 2017   Sexual activity: Yes  Other Topics Concern   Not on file  Social History Narrative   Not on file   Social Determinants of Health   Financial Resource Strain: Low Risk  (03/25/2023)   Overall Financial Resource Strain (CARDIA)    Difficulty of Paying Living Expenses: Not hard at all  Food Insecurity: Not on file  Transportation Needs: Not on file  Physical Activity: Not on file  Stress: Not on file  Social Connections: Unknown (05/08/2022)   Received from [facility]   Social Network    Social Network: Not on file  Intimate Partner Violence: Unknown (03/30/2022)   Received from [facility]   HITS    Physically Hurt: Not on file    Insult or Talk Down To: Not on file    Threaten Physical Harm: Not on file    Scream or Curse: Not on file    Physical Exam: Today's Vitals   12/03/23 0724 12/03/23 0725 12/03/23 0800  BP: (!) 140/63    Pulse: (!) 40  74  Resp:   17  Temp: (!) 97.3 F (36.3 C) (!) 97.3 F (36.3 C)   SpO2: 100%  100%  Weight: 218 lb (98.9 kg)    Height: 6' (1.829 m)     Body mass index is 29.57 kg/m. GEN: NAD EYE: Sclerae anicteric ENT: MMM CV: Non-tachycardic GI: Soft,  NT/ND NEURO:  Alert & Oriented x 3  Lab Results: No results for input(s): "WBC", "HGB", "HCT", "PLT" in the last 72 hours. BMET No results for input(s): "NA", "K", "CL", "CO2", "GLUCOSE", "BUN", "CREATININE", "CALCIUM" in the last 72 hours. LFT No results for input(s): "PROT", "ALBUMIN", "AST", "ALT", "ALKPHOS", "BILITOT", "BILIDIR", "IBILI" in the last 72 hours. PT/INR No results for input(s): "LABPROT", "  INR" in the last 72 hours.   Impression / Plan: This is a 62 y.o.male  who presents for EGD for evaluation of GERD on PPI therapy and belching/bloating/eructation.  The risks and benefits of endoscopic evaluation/treatment were discussed with the patient and/or family; these include but are not limited to the risk of perforation, infection, bleeding, missed lesions, lack of diagnosis, severe illness requiring hospitalization, as well as anesthesia and sedation related illnesses.  The patient's history has been reviewed, patient examined, no change in status, and deemed stable for procedure.  The patient and/or family is agreeable to proceed.    Corliss Parish, MD [facility] Office # [phone]

## 2023-12-03 NOTE — Op Note (Signed)
[facility] Patient Name: Kyle Merritt Procedure Date: 12/03/2023 7:13 AM MRN: [id number] Endoscopist: Corliss Parish , MD, [id number] Age: 62 Referring MD:  Date of Birth: 05/11/1961 Gender: Male Account #: [id number] Procedure:                Upper GI endoscopy Indications:              Epigastric abdominal distress, Abdominal bloating,                            Eructation Medicines:                Monitored Anesthesia Care Procedure:                Pre-Anesthesia Assessment:                           - Prior to the procedure, a History and Physical                            was performed, and patient medications and                            allergies were reviewed. The patient's tolerance of                            previous anesthesia was also reviewed. The risks                            and benefits of the procedure and the sedation                            options and risks were discussed with the patient.                            All questions were answered, and informed consent                            was obtained. Prior Anticoagulants: The patient has                            taken no anticoagulant or antiplatelet agents. ASA                            Grade Assessment: III - A patient with severe                            systemic disease. After reviewing the risks and                            benefits, the patient was deemed in satisfactory                            condition to undergo the procedure.  After obtaining informed consent, the endoscope was                            passed under direct vision. Throughout the                            procedure, the patient's blood pressure, pulse, and                            oxygen saturations were monitored continuously. The                            GIF [id number] #[id number] was introduced through the                            mouth, and advanced to the second part  of duodenum.                            The upper GI endoscopy was accomplished without                            difficulty. The patient tolerated the procedure. Scope In: Scope Out: Findings:                 No gross lesions were noted in the entire esophagus.                           The Z-line was irregular and was found 40 cm from                            the incisors.                           Diffuse moderate inflammation characterized by                            erosions, erythema and granularity was found in the                            entire examined stomach. Biopsies were taken with a                            cold forceps for histology and Helicobacter pylori                            testing.                           No gross lesions were noted in the duodenal bulb,                            in the first portion of the duodenum and in the  second portion of the duodenum. Biopsies were taken                            with a cold forceps for histology. Complications:            No immediate complications. Estimated Blood Loss:     Estimated blood loss was minimal. Impression:               - No gross lesions in the entire esophagus. Z-line                            irregular, 40 cm from the incisors.                           - Gastritis. Biopsied.                           - No gross lesions in the duodenal bulb, in the                            first portion of the duodenum and in the second                            portion of the duodenum. Biopsied. Recommendation:           - The patient will be observed post-procedure,                            until all discharge criteria are met.                           - Discharge patient to home.                           - Patient has a contact number available for                            emergencies. The signs and symptoms of potential                            delayed  complications were discussed with the                            patient. Return to normal activities tomorrow.                            Written discharge instructions were provided to the                            patient.                           - Resume previous diet.                           - Initiate Nexium 40 mg twice daily.                           -  Observe patient's clinical course.                           - Await pathology results.                           - The findings and recommendations were discussed                            with the patient.                           - The findings and recommendations were discussed                            with the designated responsible adult. Corliss Parish, MD 12/03/2023 8:19:00 AM

## 2023-12-04 ENCOUNTER — Telehealth: Payer: Self-pay

## 2023-12-04 NOTE — Telephone Encounter (Signed)
  Follow up Call-     12/03/2023    7:25 AM 09/17/2023    7:10 AM  Call back number  Post procedure Call Back phone  # [phone]  Permission to leave phone message Yes Yes     Patient questions:  Do you have a fever, pain , or abdominal swelling? No. Pain Score  0 *  Have you tolerated food without any problems? Yes.    Have you been able to return to your normal activities? Yes.    Do you have any questions about your discharge instructions: Diet   No. Medications  No. Follow up visit  No.  Do you have questions or concerns about your Care? No.  Actions: * If pain score is 4 or above: No action needed, pain <4.

## 2023-12-05 ENCOUNTER — Encounter: Payer: Self-pay | Admitting: Gastroenterology

## 2023-12-05 LAB — SURGICAL PATHOLOGY

## 2023-12-27 ENCOUNTER — Other Ambulatory Visit: Payer: Self-pay | Admitting: Nurse Practitioner

## 2023-12-27 ENCOUNTER — Telehealth: Payer: Self-pay

## 2023-12-27 MED ORDER — SILDENAFIL CITRATE 100 MG PO TABS: 100.0000 mg | ORAL_TABLET | ORAL | 1 refills | Status: DC | PRN

## 2023-12-27 NOTE — Telephone Encounter (Signed)
 Copied from CRM [id number]. Topic: Clinical - Medication Refill >> Dec 27, 2023  1:20 PM Powell HERO wrote: Most Recent Primary Care Visit:  Provider: OLEY BASCOM RAMAN  Department: [facility]  Visit Type: OFFICE VISIT  Date: 11/01/2023  Medication: sildenafil  (VIAGRA ) 100 MG tablet   Has the patient contacted their pharmacy? Yes (They do not have RX.  Is this the correct pharmacy for this prescription? Yes If no, delete pharmacy and type the correct one.  This is the patient's preferred pharmacy:  [facility], [place] - [address] [place] [address] Phone: [phone] Fax: [phone]     Has the prescription been filled recently? No  Is the patient out of the medication? Yes  Has the patient been seen for an appointment in the last year OR does the patient have an upcoming appointment? Yes  Can we respond through MyChart? No, Please call on the phone  Agent: Please be advised that Rx refills may take up to 3 business days. We ask that you follow-up with your pharmacy.

## 2023-12-27 NOTE — Telephone Encounter (Signed)
 Copied from CRM [id number]. Topic: Clinical - Medication Refill >> Dec 27, 2023  1:20 PM Powell HERO wrote: Most Recent Primary Care Visit:  Provider: OLEY BASCOM RAMAN  Department: [facility]  Visit Type: OFFICE VISIT  Date: 11/01/2023  Medication: sildenafil  (VIAGRA ) 100 MG tablet  Has the patient contacted their pharmacy? Yes The pharmacy said they do not have refills.  Is this the correct pharmacy for this prescription? Yes If no, delete pharmacy and type the correct one.  This is the patient's preferred pharmacy:  [facility], [place] - [address] [place] [address] Phone: [phone] Fax: [phone]   Has the prescription been filled recently? Yes  Is the patient out of the medication? Yes  Has the patient been seen for an appointment in the last year OR does the patient have an upcoming appointment? Yes  Can we respond through MyChart? Please call on the phone  Agent: Please be advised that Rx refills may take up to 3 business days. We ask that you follow-up with your pharmacy.

## 2023-12-30 MED ORDER — SILDENAFIL CITRATE 100 MG PO TABS: 50.0000 mg | ORAL_TABLET | ORAL | 1 refills | Status: DC | PRN

## 2024-02-07 ENCOUNTER — Encounter: Payer: Self-pay | Admitting: Nurse Practitioner

## 2024-02-07 ENCOUNTER — Ambulatory Visit (INDEPENDENT_AMBULATORY_CARE_PROVIDER_SITE_OTHER): Payer: 59 | Admitting: Nurse Practitioner

## 2024-02-07 ENCOUNTER — Telehealth: Payer: Self-pay

## 2024-02-07 VITALS — BP 111/85 | HR 93 | Temp 97.9°F | Wt 231.0 lb

## 2024-02-07 DIAGNOSIS — E1169 Type 2 diabetes mellitus with other specified complication: Secondary | ICD-10-CM

## 2024-02-07 DIAGNOSIS — Z23 Encounter for immunization: Secondary | ICD-10-CM

## 2024-02-07 LAB — POCT GLYCOSYLATED HEMOGLOBIN (HGB A1C): Hemoglobin A1C: 7.7 % — AB (ref 4.0–5.6)

## 2024-02-07 MED ORDER — TRULICITY 0.75 MG/0.5ML ~~LOC~~ SOAJ: 0.7500 mg | SUBCUTANEOUS | 2 refills | Status: DC

## 2024-02-07 NOTE — Telephone Encounter (Signed)
Pharmacy Patient Advocate Encounter   Received notification from CoverMyMeds that prior authorization for TRULICITY is required/requested.   Insurance verification completed.   The patient is insured through [facility] .   Per test claim: PA required; PA submitted to above mentioned insurance via CoverMyMeds Key/confirmation #/EOC [id number] Status is pending

## 2024-02-07 NOTE — Progress Notes (Signed)
Subjective   Patient ID: Kyle Merritt, male    DOB: 1961-08-24, 63 y.o.   MRN: [id number]  Chief Complaint  Patient presents with   Follow-up    Referring provider: Ivonne Andrew, NP  Kyle Merritt is a 63 y.o. male with Past Medical History: No date: Allergy No date: Arthritis No date: Cataract     Comment:  R eye No date: Colon cancer (HCC) No date: Diabetes mellitus without complication (HCC) No date: Glaucoma No date: High cholesterol No date: History of colon cancer 2004: History of colon cancer No date: Hypertension No date: Substance abuse [facility])     Comment:  2017 stopped   HPI  Patient presents for follow up of diabetes. Current symptoms include: hyperglycemia and weight gain. Patient denies foot ulcerations, hypoglycemia , increased appetite, nausea, paresthesia of the feet, polydipsia and polyuria. Evaluation to date has included: fasting blood sugar, fasting lipid panel, hemoglobin A1C and microalbuminuria.  Home sugars: not checking. Current treatment: more intensive attention to diet which has not been effective, Continued metformin, jardiance, and ozempic which has been effective, Continued statin which has been effective and Continued ACE inhibitor/ARB which has been effective. A1C is 7.7 today.  Patient was unable to tolerate Ozempic.  We will trial Trulicity.   Patient is not checking home blood sugars.   Home blood sugar records: patient does not check sugars How often is blood sugars being checked:  Current symptoms/problems include none and have been worsening.       Allergies  Allergen Reactions   Crab [Shellfish Allergy]     itching    Immunization History  Administered Date(s) Administered   Influenza Whole 12/08/2008, 02/03/2010   Influenza, Seasonal, Injecte, Preservative Fre 02/07/2024   Influenza,inj,Quad PF,6+ Mos 10/17/2018, 08/26/2019, 12/12/2020, 10/19/2022   Pneumococcal Polysaccharide-23 03/24/2009, 01/29/2018   Td 12/08/2008    Tdap 01/29/2018    Tobacco History: Social History   Tobacco Use  Smoking Status Some Days   Current packs/day: 0.00   Types: Cigarettes   Last attempt to quit: 2018   Years since quitting: 7.1  Smokeless Tobacco Never   Ready to quit: Yes Counseling given: Yes   Outpatient Encounter Medications as of 02/07/2024  Medication Sig   Accu-Chek Softclix Lancets lancets Use as directed twice daily.   atorvastatin (LIPITOR) 40 MG tablet Take 1 tablet (40 mg total) by mouth every evening.   blood glucose meter kit and supplies KIT Dispense based on patient and insurance preference. Use up to four times daily as directed. (FOR ICD-10  E11.9).   Blood Pressure Monitor DEVI Use to check blood pressure daily   dorzolamide-timolol (COSOPT) 2-0.5 % ophthalmic solution Place 1 drop into the right eye 2 (two) times daily.   Dulaglutide (TRULICITY) 0.75 MG/0.5ML SOAJ Inject 0.75 mg into the skin once a week.   empagliflozin (JARDIANCE) 25 MG TABS tablet Take 1 tablet (25 mg total) by mouth daily before breakfast.   esomeprazole (NEXIUM) 40 MG capsule Take 1 capsule (40 mg total) by mouth 2 (two) times daily before a meal.   glucose blood (ACCU-CHEK GUIDE) test strip Use as instructed twice daily.   lisinopril-hydrochlorothiazide (ZESTORETIC) 20-25 MG tablet Take 1 tablet by mouth daily.   metFORMIN (GLUCOPHAGE) 1000 MG tablet Take 1 tablet (1,000 mg total) by mouth daily with breakfast.   sildenafil (VIAGRA) 100 MG tablet Take 0.5 tablets (50 mg total) by mouth as needed for erectile dysfunction.   [DISCONTINUED] sildenafil (VIAGRA) 100 MG tablet  Take 1 tablet (100 mg total) by mouth as needed for erectile dysfunction.   No facility-administered encounter medications on file as of 02/07/2024.    Review of Systems  Review of Systems  Constitutional: Negative.   HENT: Negative.    Cardiovascular: Negative.   Gastrointestinal: Negative.   Allergic/Immunologic: Negative.   Neurological:  Negative.   Psychiatric/Behavioral: Negative.       Objective:   BP 111/85   Pulse 93   Temp 97.9 F (36.6 C)   Wt 231 lb (104.8 kg)   SpO2 100%   BMI 31.33 kg/m   Wt Readings from Last 5 Encounters:  02/07/24 231 lb (104.8 kg)  12/03/23 218 lb (98.9 kg)  11/01/23 215 lb 3.2 oz (97.6 kg)  10/28/23 218 lb (98.9 kg)  09/17/23 218 lb (98.9 kg)     Physical Exam Vitals and nursing note reviewed.  Constitutional:      General: He is not in acute distress.    Appearance: He is well-developed.  Cardiovascular:     Rate and Rhythm: Normal rate and regular rhythm.  Pulmonary:     Effort: Pulmonary effort is normal.     Breath sounds: Normal breath sounds.  Skin:    General: Skin is warm and dry.  Neurological:     Mental Status: He is alert and oriented to person, place, and time.       Assessment & Plan:   Type 2 diabetes mellitus with other specified complication, without long-term current use of insulin (HCC) -     POCT glycosylated hemoglobin (Hb A1C) -     Trulicity; Inject 0.75 mg into the skin once a week.  Dispense: 0.5 mL; Refill: 2  Immunization due -     Flu vaccine trivalent PF, 6mos and older(Flulaval,Afluria,Fluarix,Fluzone)     Return in about 3 months (around 05/06/2024).   Ivonne Andrew, NP 02/07/2024

## 2024-02-10 ENCOUNTER — Other Ambulatory Visit: Payer: Self-pay

## 2024-02-10 NOTE — Telephone Encounter (Signed)
 Pharmacy Patient Advocate Encounter  Received notification from [employer] that Prior Authorization for TRULICITY has been APPROVED from 02/07/2024 to 12/23/2024   PA #/Case ID/Reference #: [id number]

## 2024-02-26 ENCOUNTER — Ambulatory Visit: Payer: 59 | Admitting: Podiatry

## 2024-03-03 ENCOUNTER — Ambulatory Visit (INDEPENDENT_AMBULATORY_CARE_PROVIDER_SITE_OTHER): Admitting: Podiatry

## 2024-03-03 ENCOUNTER — Encounter: Payer: Self-pay | Admitting: Podiatry

## 2024-03-03 VITALS — Ht 72.0 in | Wt 231.0 lb

## 2024-03-03 DIAGNOSIS — M2012 Hallux valgus (acquired), left foot: Secondary | ICD-10-CM | POA: Diagnosis not present

## 2024-03-03 DIAGNOSIS — E1169 Type 2 diabetes mellitus with other specified complication: Secondary | ICD-10-CM | POA: Diagnosis not present

## 2024-03-03 DIAGNOSIS — F17210 Nicotine dependence, cigarettes, uncomplicated: Secondary | ICD-10-CM | POA: Insufficient documentation

## 2024-03-03 DIAGNOSIS — M2011 Hallux valgus (acquired), right foot: Secondary | ICD-10-CM | POA: Diagnosis not present

## 2024-03-03 DIAGNOSIS — K30 Functional dyspepsia: Secondary | ICD-10-CM | POA: Insufficient documentation

## 2024-03-03 DIAGNOSIS — E119 Type 2 diabetes mellitus without complications: Secondary | ICD-10-CM

## 2024-03-03 DIAGNOSIS — H409 Unspecified glaucoma: Secondary | ICD-10-CM | POA: Insufficient documentation

## 2024-03-03 DIAGNOSIS — B351 Tinea unguium: Secondary | ICD-10-CM | POA: Diagnosis not present

## 2024-03-03 DIAGNOSIS — N138 Other obstructive and reflux uropathy: Secondary | ICD-10-CM | POA: Insufficient documentation

## 2024-03-03 DIAGNOSIS — J410 Simple chronic bronchitis: Secondary | ICD-10-CM | POA: Insufficient documentation

## 2024-03-03 DIAGNOSIS — E1136 Type 2 diabetes mellitus with diabetic cataract: Secondary | ICD-10-CM | POA: Insufficient documentation

## 2024-03-03 DIAGNOSIS — I2510 Atherosclerotic heart disease of native coronary artery without angina pectoris: Secondary | ICD-10-CM | POA: Insufficient documentation

## 2024-03-03 DIAGNOSIS — Z7251 High risk heterosexual behavior: Secondary | ICD-10-CM | POA: Insufficient documentation

## 2024-03-03 DIAGNOSIS — M79676 Pain in unspecified toe(s): Secondary | ICD-10-CM | POA: Diagnosis not present

## 2024-03-03 NOTE — Progress Notes (Signed)
 ANNUAL DIABETIC FOOT EXAM  Subjective: Kyle Merritt presents today for annual diabetic foot exam.  Chief Complaint  Patient presents with   [facility]    He is here for a diabetic nail trim, PCP is tonya nichols and seen 3 months ago. " Last A1C was 7.4"    Patient confirms h/o diabetes.  Patient denies any h/o foot wounds.  Ivonne Andrew, NP is patient's PCP.  Past Medical History:  Diagnosis Date   Allergy    Arthritis    Cataract    R eye   Colon cancer (HCC)    Diabetes mellitus without complication (HCC)    Glaucoma    High cholesterol    History of colon cancer    History of colon cancer 2004   Hypertension    Substance abuse (HCC)    2017 stopped   Patient Active Problem List   Diagnosis Date Noted   Cigarette nicotine dependence without complication 03/03/2024   Coronary arteriosclerosis 03/03/2024   Diabetic cataract (HCC) 03/03/2024   Functional dyspepsia 03/03/2024   Glaucoma of both eyes 03/03/2024   High risk heterosexual behavior 03/03/2024   Prostate hyperplasia with urinary obstruction 03/03/2024   Simple chronic bronchitis (HCC) 03/03/2024   Ulnar nerve entrapment at elbow 04/22/2023   Bilateral carpal tunnel syndrome 04/17/2023   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 04/17/2023   Cervical radiculopathy 04/17/2023   Paresthesia 01/10/2023   Neck pain on left side 01/10/2023   Hemoglobin A1c less than 7.0% 05/19/2020   Generalized abdominal pain 05/19/2020   Elevated sed rate 01/07/2020   Elevated C-reactive protein (CRP) 01/07/2020   S/P TKR (total knee replacement), right 11/30/2019   Osteoarthritis of right knee 11/26/2019   Constipation 10/06/2018   Olecranon bursitis of left elbow 06/04/2018   Trigger finger, acquired 06/04/2018   Cervical disc disorder with radiculopathy of cervical region 05/08/2018   Other chronic pain 02/04/2018   History of cocaine use 02/04/2018   Hyperlipidemia 02/04/2018   Colon cancer screening 11/29/2014    Knee pain, acute 10/04/2014   Dental abscess 08/02/2014   Diabetes (HCC) 04/29/2014   HTN (hypertension) 04/29/2014   Dyslipidemia 04/29/2014   History of colon cancer 04/29/2014   Back pain 04/29/2014   Erectile dysfunction 04/29/2014   Essential hypertension 07/27/2013   Tobacco abuse 07/27/2013   Pure hypercholesterolemia 12/31/2008   TESTOSTERONE DEFICIENCY 12/10/2008   GERD 12/08/2008   ONYCHOMYCOSIS, BILATERAL 11/09/2008   DIABETES MELLITUS 11/09/2008   Anemia 11/09/2008   Malignant neoplasm of colon (HCC) 12/24/2002   Past Surgical History:  Procedure Laterality Date   carpel tunnel      08/29/23   COLON SURGERY     KNEE ARTHROSCOPY Bilateral    TOTAL KNEE ARTHROPLASTY Right 11/30/2019   Procedure: RIGHT TOTAL KNEE ARTHROPLASTY;  Surgeon: Gean Birchwood, MD;  Location: [facility];  Service: Orthopedics;  Laterality: Right;   TRIGGER FINGER RELEASE Right 05/22/2019   Current Outpatient Medications on File Prior to Visit  Medication Sig Dispense Refill   Accu-Chek Softclix Lancets lancets Use as directed twice daily. 100 each 5   atorvastatin (LIPITOR) 40 MG tablet Take 1 tablet (40 mg total) by mouth every evening. 90 tablet 3   blood glucose meter kit and supplies KIT Dispense based on patient and insurance preference. Use up to four times daily as directed. (FOR ICD-10  E11.9). 1 each 0   Blood Pressure Monitor DEVI Use to check blood pressure daily 1 each 0  dorzolamide-timolol (COSOPT) 2-0.5 % ophthalmic solution Place 1 drop into the right eye 2 (two) times daily.     Dulaglutide (TRULICITY) 0.75 MG/0.5ML SOAJ Inject 0.75 mg into the skin once a week. 0.5 mL 2   empagliflozin (JARDIANCE) 25 MG TABS tablet Take 1 tablet (25 mg total) by mouth daily before breakfast. 90 tablet 3   esomeprazole (NEXIUM) 40 MG capsule Take 1 capsule (40 mg total) by mouth 2 (two) times daily before a meal. 60 capsule 11   glucose blood (ACCU-CHEK GUIDE) test strip Use as instructed twice  daily. 100 each 12   lisinopril-hydrochlorothiazide (ZESTORETIC) 20-25 MG tablet Take 1 tablet by mouth daily. 90 tablet 3   metFORMIN (GLUCOPHAGE) 1000 MG tablet Take 1 tablet (1,000 mg total) by mouth daily with breakfast.     sildenafil (VIAGRA) 100 MG tablet Take 0.5 tablets (50 mg total) by mouth as needed for erectile dysfunction. 10 tablet 1   No current facility-administered medications on file prior to visit.    Allergies  Allergen Reactions   Crab [Shellfish Allergy]     itching   Social History   Occupational History   Occupation: [employer]  Tobacco Use   Smoking status: Some Days    Current packs/day: 0.00    Types: Cigarettes    Last attempt to quit: 2018    Years since quitting: 7.2   Smokeless tobacco: Never  Vaping Use   Vaping status: Never Used  Substance and Sexual Activity   Alcohol use: No   Drug use: Not Currently    Types: "Crack" cocaine    Comment: stopped in 2017   Sexual activity: Yes   Family History  Problem Relation Age of Onset   Diabetes Mother    Hypertension Mother    Diabetes Sister    Colon cancer Paternal Uncle    Esophageal cancer Neg Hx    Inflammatory bowel disease Neg Hx    Liver disease Neg Hx    Pancreatic cancer Neg Hx    Stomach cancer Neg Hx    Colon polyps Neg Hx    Rectal cancer Neg Hx    Immunization History  Administered Date(s) Administered   Influenza Whole 12/08/2008, 02/03/2010   Influenza, Seasonal, Injecte, Preservative Fre 02/07/2024   Influenza,inj,Quad PF,6+ Mos 10/17/2018, 08/26/2019, 12/12/2020, 11/09/2021, 10/19/2022   PNEUMOCOCCAL CONJUGATE-20 11/09/2021   Pneumococcal Polysaccharide-23 03/24/2009, 01/29/2018   Td 12/08/2008   Tdap 01/29/2018     Review of Systems: Negative except as noted in the HPI.   Objective: There were no vitals filed for this visit.  Kyle Merritt is a pleasant 63 y.o. male in NAD. AAO X 3.  Diabetic foot exam was performed with the following findings:   Vascular  Examination: Capillary refill time immediate b/l. Vascular status intact b/l with palpable pedal pulses. Pedal hair present b/l. No pain with calf compression b/l. Skin temperature gradient WNL b/l. No cyanosis or clubbing b/l. No ischemia or gangrene noted b/l.   Neurological Examination: Sensation grossly intact b/l with 10 gram monofilament. Vibratory sensation intact b/l.   Dermatological Examination: Pedal skin with normal turgor, texture and tone b/l.  No open wounds. No interdigital macerations.   Toenails 1-5 b/l thick, discolored, elongated with subungual debris and pain on dorsal palpation.   No corns, calluses nor porokeratotic lesions noted.  Musculoskeletal Examination: Muscle strength 5/5 to all lower extremity muscle groups bilaterally. HAV with bunion deformity noted b/l LE. Patient ambulates independent of any assistive aids.  Radiographs: None     Lab Results  Component Value Date   HGBA1C 7.7 (A) 02/07/2024   ADA Risk Categorization: Low Risk :  Patient has all of the following: Intact protective sensation No prior foot ulcer  No severe deformity Pedal pulses present  Assessment: 1. Pain due to onychomycosis of nail   2. Hallux valgus, acquired, bilateral   3. Type 2 diabetes mellitus with other specified complication, without long-term current use of insulin (HCC)   4. Encounter for diabetic foot exam (HCC)     Plan: Diabetic foot examination performed today.  All patient's and/or POA's questions/concerns addressed on today's visit. Mycotic toenails 1-5 debrided in length and girth without incident. Continue daily foot inspections and monitor blood glucose per PCP/Endocrinologist's recommendations. Continue soft, supportive shoe gear daily. Report any pedal injuries to medical professional. Call office if there are any questions/concerns. -Patient/POA to call should there be question/concern in the interim. Return in about 3 months (around  06/03/2024).  Freddie Breech, DPM      [facility] LOCATION: [address] [place] [address]                   Office [phone]   [facility] LOCATION: [address] [place], [place] [address] Office [phone]

## 2024-03-18 ENCOUNTER — Other Ambulatory Visit: Payer: Self-pay | Admitting: Nurse Practitioner

## 2024-03-18 MED ORDER — SILDENAFIL CITRATE 100 MG PO TABS: 50.0000 mg | ORAL_TABLET | ORAL | 1 refills | Status: AC | PRN

## 2024-04-13 ENCOUNTER — Other Ambulatory Visit: Payer: Self-pay | Admitting: Nurse Practitioner

## 2024-04-13 DIAGNOSIS — E1169 Type 2 diabetes mellitus with other specified complication: Secondary | ICD-10-CM

## 2024-05-06 ENCOUNTER — Ambulatory Visit (INDEPENDENT_AMBULATORY_CARE_PROVIDER_SITE_OTHER): Payer: Self-pay | Admitting: Nurse Practitioner

## 2024-05-06 ENCOUNTER — Encounter: Payer: Self-pay | Admitting: Nurse Practitioner

## 2024-05-06 VITALS — BP 129/90 | HR 79 | Temp 97.7°F | Wt 225.6 lb

## 2024-05-06 DIAGNOSIS — E1169 Type 2 diabetes mellitus with other specified complication: Secondary | ICD-10-CM | POA: Diagnosis not present

## 2024-05-06 DIAGNOSIS — G8929 Other chronic pain: Secondary | ICD-10-CM | POA: Diagnosis not present

## 2024-05-06 DIAGNOSIS — G5602 Carpal tunnel syndrome, left upper limb: Secondary | ICD-10-CM | POA: Diagnosis not present

## 2024-05-06 DIAGNOSIS — M25562 Pain in left knee: Secondary | ICD-10-CM | POA: Diagnosis not present

## 2024-05-06 NOTE — Progress Notes (Signed)
 Subjective   Patient ID: Kyle Merritt, male    DOB: 07-16-1961, 63 y.o.   MRN: [id number]  Chief Complaint  Patient presents with   Medical Management of Chronic Issues    Patient stated that he will like a couple of referrals     Referring provider: Jerrlyn Morel, NP  Kyle Merritt is a 63 y.o. male with Past Medical History: No date: Allergy No date: Arthritis No date: Cataract     Comment:  R eye No date: Colon cancer (HCC) No date: Diabetes mellitus without complication (HCC) No date: Glaucoma No date: High cholesterol No date: History of colon cancer 2004: History of colon cancer No date: Hypertension No date: Substance abuse [name]     Comment:  2017 stopped   HPI  Patient presents for follow up of diabetes. Current symptoms include: hyperglycemia and weight gain. Patient denies foot ulcerations, hypoglycemia , increased appetite, nausea, paresthesia of the feet, polydipsia and polyuria. Evaluation to date has included: fasting blood sugar, fasting lipid panel, hemoglobin A1C and microalbuminuria.  Home sugars: not checking. Current treatment: more intensive attention to diet which has not been effective, Continued metformin , jardiance , and ozempic  which has been effective, Continued statin which has been effective and Continued ACE inhibitor/ARB which has been effective. A1C is 7.7 at last visit.  Patient is followed by nephrology.    Patient is not checking home blood sugars.   Home blood sugar records: patient does not check sugars How often is blood sugars being checked:  Current symptoms/problems include none and have been worsening.   Note: Patient would like referral back to Ortho for chronic left knee pain.  He has had a knee replacement to his right knee and states that he needs to get back to have his left knee done.  He also needs a referral back to neurology for carpal tunnel to his left wrist.  He has had surgery on his right wrist for carpal tunnel and  states that he needs a referral back for surgery to his left wrist.     Allergies  Allergen Reactions   Crab [Shellfish Allergy]     itching    Immunization History  Administered Date(s) Administered   Influenza Whole 12/08/2008, 02/03/2010   Influenza, Seasonal, Injecte, Preservative Fre 02/07/2024   Influenza,inj,Quad PF,6+ Mos 10/17/2018, 08/26/2019, 12/12/2020, 11/09/2021, 10/19/2022   PNEUMOCOCCAL CONJUGATE-20 11/09/2021   Pneumococcal Polysaccharide-23 03/24/2009, 01/29/2018   Td 12/08/2008   Tdap 01/29/2018    Tobacco History: Social History   Tobacco Use  Smoking Status Some Days   Current packs/day: 0.00   Types: Cigarettes   Last attempt to quit: 2018   Years since quitting: 7.3  Smokeless Tobacco Never   Ready to quit: Yes Counseling given: Yes   Outpatient Encounter Medications as of 05/06/2024  Medication Sig   Accu-Chek Softclix Lancets lancets Use as directed twice daily.   atorvastatin  (LIPITOR) 40 MG tablet Take 1 tablet (40 mg total) by mouth every evening.   blood glucose meter kit and supplies KIT Dispense based on patient and insurance preference. Use up to four times daily as directed. (FOR ICD-10  E11.9).   Blood Pressure Monitor DEVI Use to check blood pressure daily   dorzolamide-timolol  (COSOPT) 2-0.5 % ophthalmic solution Place 1 drop into the right eye 2 (two) times daily.   Dulaglutide  (TRULICITY ) 0.75 MG/0.5ML SOAJ INJECT 0.75 MG INTO THE SKIN ONCE A WEEK.   empagliflozin  (JARDIANCE ) 25 MG TABS tablet Take 1 tablet (25  mg total) by mouth daily before breakfast.   esomeprazole  (NEXIUM ) 40 MG capsule Take 1 capsule (40 mg total) by mouth 2 (two) times daily before a meal.   glucose blood (ACCU-CHEK GUIDE) test strip Use as instructed twice daily.   lisinopril -hydrochlorothiazide  (ZESTORETIC ) 20-25 MG tablet Take 1 tablet by mouth daily.   metFORMIN  (GLUCOPHAGE ) 1000 MG tablet Take 1 tablet (1,000 mg total) by mouth daily with breakfast.    sildenafil  (VIAGRA ) 100 MG tablet Take 0.5 tablets (50 mg total) by mouth as needed for erectile dysfunction.   sildenafil  (VIAGRA ) 100 MG tablet TAKE 1/2 TABLET BY MOUTH AS NEEDED FOR ERECTILE DYSFUNCTION   No facility-administered encounter medications on file as of 05/06/2024.    Review of Systems  Review of Systems  Constitutional: Negative.   HENT: Negative.    Cardiovascular: Negative.   Gastrointestinal: Negative.   Allergic/Immunologic: Negative.   Neurological: Negative.   Psychiatric/Behavioral: Negative.       Objective:   BP (!) 129/90   Pulse 79   Temp 97.7 F (36.5 C) (Oral)   Wt 225 lb 9.6 oz (102.3 kg)   SpO2 100%   BMI 30.60 kg/m   Wt Readings from Last 5 Encounters:  05/06/24 225 lb 9.6 oz (102.3 kg)  03/03/24 231 lb (104.8 kg)  02/07/24 231 lb (104.8 kg)  12/03/23 218 lb (98.9 kg)  11/01/23 215 lb 3.2 oz (97.6 kg)     Physical Exam Vitals and nursing note reviewed.  Constitutional:      General: He is not in acute distress.    Appearance: He is well-developed.  Cardiovascular:     Rate and Rhythm: Normal rate and regular rhythm.  Pulmonary:     Effort: Pulmonary effort is normal.     Breath sounds: Normal breath sounds.  Skin:    General: Skin is warm and dry.  Neurological:     Mental Status: He is alert and oriented to person, place, and time.       Assessment & Plan:   Chronic pain of left knee -     Ambulatory referral to Orthopedic Surgery  Type 2 diabetes mellitus with other specified complication, without long-term current use of insulin  (HCC) -     Microalbumin / creatinine urine ratio  Carpal tunnel syndrome of left wrist -     Ambulatory referral to Neurology     Return in about 3 months (around 08/06/2024).   Jerrlyn Morel, NP 05/06/2024

## 2024-05-06 NOTE — Patient Instructions (Signed)
 1. Type 2 diabetes mellitus with other specified complication, without long-term current use of insulin  (HCC)  - Urine Albumin/Creatinine with ratio (send out) [[id number]]  2. Chronic pain of left knee (Primary)  - Ambulatory referral to Orthopedic Surgery  3. Carpal tunnel syndrome of left wrist  - Ambulatory referral to Neurology

## 2024-05-07 ENCOUNTER — Other Ambulatory Visit: Payer: Self-pay | Admitting: Nurse Practitioner

## 2024-05-07 DIAGNOSIS — E1169 Type 2 diabetes mellitus with other specified complication: Secondary | ICD-10-CM

## 2024-05-07 DIAGNOSIS — I10 Essential (primary) hypertension: Secondary | ICD-10-CM

## 2024-05-07 DIAGNOSIS — E785 Hyperlipidemia, unspecified: Secondary | ICD-10-CM

## 2024-05-07 LAB — MICROALBUMIN / CREATININE URINE RATIO
Creatinine, Urine: 103.2 mg/dL
Microalb/Creat Ratio: 7 mg/g{creat} (ref 0–29)
Microalbumin, Urine: 7.6 ug/mL

## 2024-05-31 ENCOUNTER — Ambulatory Visit (HOSPITAL_COMMUNITY)
Admission: EM | Admit: 2024-05-31 | Discharge: 2024-05-31 | Disposition: A | Discharge status: Home / Self Care | Attending: Physician Assistant | Admitting: Physician Assistant

## 2024-05-31 ENCOUNTER — Encounter (HOSPITAL_COMMUNITY): Payer: Self-pay | Admitting: Emergency Medicine

## 2024-05-31 DIAGNOSIS — S0101XA Laceration without foreign body of scalp, initial encounter: Secondary | ICD-10-CM

## 2024-05-31 MED ORDER — LIDOCAINE HCL (PF) 2 % IJ SOLN
INTRAMUSCULAR | Status: AC
  Filled 2024-05-31: qty 5

## 2024-05-31 MED ORDER — LIDOCAINE HCL (PF) 1 % IJ SOLN: 5.0000 mL | Freq: Once | INTRAMUSCULAR | Status: DC

## 2024-05-31 NOTE — ED Triage Notes (Signed)
 Pt reports was unloading a truck at the dumpster and hit his head on side of it causing laceration to head. Unknown last tetanus.

## 2024-05-31 NOTE — ED Provider Notes (Signed)
 [facility]    CSN: [id number] Arrival date & time: 05/31/24  1352      History   Chief Complaint Chief Complaint  Patient presents with   Laceration    HPI Kyle Merritt is a 62 y.o. male.   Patient complains of a laceration on his scalp.  Patient states he was putting boxes in a dumpster and cut the top of his head.  Patient states he did not realize that he struck his head.  He did not lose consciousness.  He is not on any blood thinners.  Patient denies any nausea or any dizziness.  Patient has a past medical history of diabetes  The history is provided by the patient. The history is limited by a language barrier.  Laceration   Past Medical History:  Diagnosis Date   Allergy    Arthritis    Cataract    R eye   Colon cancer (HCC)    Diabetes mellitus without complication (HCC)    Glaucoma    High cholesterol    History of colon cancer    History of colon cancer 2004   Hypertension    Substance abuse (HCC)    2017 stopped    Patient Active Problem List   Diagnosis Date Noted   Cigarette nicotine dependence without complication 03/03/2024   Coronary arteriosclerosis 03/03/2024   Diabetic cataract (HCC) 03/03/2024   Functional dyspepsia 03/03/2024   Glaucoma of both eyes 03/03/2024   High risk heterosexual behavior 03/03/2024   Prostate hyperplasia with urinary obstruction 03/03/2024   Simple chronic bronchitis (HCC) 03/03/2024   Ulnar nerve entrapment at elbow 04/22/2023   Bilateral carpal tunnel syndrome 04/17/2023   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 04/17/2023   Cervical radiculopathy 04/17/2023   Paresthesia 01/10/2023   Neck pain on left side 01/10/2023   Hemoglobin A1c less than 7.0% 05/19/2020   Generalized abdominal pain 05/19/2020   Elevated sed rate 01/07/2020   Elevated C-reactive protein (CRP) 01/07/2020   S/P TKR (total knee replacement), right 11/30/2019   Osteoarthritis of right knee 11/26/2019   Constipation 10/06/2018    Olecranon bursitis of left elbow 06/04/2018   Trigger finger, acquired 06/04/2018   Cervical disc disorder with radiculopathy of cervical region 05/08/2018   Other chronic pain 02/04/2018   History of cocaine use 02/04/2018   Hyperlipidemia 02/04/2018   Colon cancer screening 11/29/2014   Knee pain, acute 10/04/2014   Dental abscess 08/02/2014   Diabetes (HCC) 04/29/2014   HTN (hypertension) 04/29/2014   Dyslipidemia 04/29/2014   History of colon cancer 04/29/2014   Back pain 04/29/2014   Erectile dysfunction 04/29/2014   Essential hypertension 07/27/2013   Tobacco abuse 07/27/2013   Pure hypercholesterolemia 12/31/2008   TESTOSTERONE  DEFICIENCY 12/10/2008   GERD 12/08/2008   ONYCHOMYCOSIS, BILATERAL 11/09/2008   DIABETES MELLITUS 11/09/2008   Anemia 11/09/2008   Malignant neoplasm of colon (HCC) 12/24/2002    Past Surgical History:  Procedure Laterality Date   carpel tunnel      08/29/23   COLON SURGERY     KNEE ARTHROSCOPY Bilateral    TOTAL KNEE ARTHROPLASTY Right 11/30/2019   Procedure: RIGHT TOTAL KNEE ARTHROPLASTY;  Surgeon: Wendolyn Hamburger, MD;  Location: [facility];  Service: Orthopedics;  Laterality: Right;   TRIGGER FINGER RELEASE Right 05/22/2019       Home Medications    Prior to Admission medications   Medication Sig Start Date End Date Taking? Authorizing Provider  Accu-Chek Softclix Lancets lancets Use as  directed twice daily. 05/31/22   Jerrlyn Morel, NP  atorvastatin  (LIPITOR) 40 MG tablet TAKE 1 TABLET (40 MG TOTAL) BY MOUTH EVERY EVENING. 05/08/24 05/08/25  Jerrlyn Morel, NP  blood glucose meter kit and supplies KIT Dispense based on patient and insurance preference. Use up to four times daily as directed. (FOR ICD-10  E11.9). 05/30/22   Jerrlyn Morel, NP  Blood Pressure Monitor DEVI Use to check blood pressure daily 11/06/22   Jerrlyn Morel, NP  dorzolamide-timolol  (COSOPT) 2-0.5 % ophthalmic solution Place 1 drop into the right eye 2 (two) times  daily. 11/01/22   [provider]  Dulaglutide  (TRULICITY ) 0.75 MG/0.5ML SOAJ INJECT 0.75 MG INTO THE SKIN ONCE A WEEK. 04/13/24   Jerrlyn Morel, NP  empagliflozin  (JARDIANCE ) 25 MG TABS tablet TAKE 1 TABLET (25 MG TOTAL) BY MOUTH DAILY BEFORE BREAKFAST. 05/08/24   Jerrlyn Morel, NP  esomeprazole  (NEXIUM ) 40 MG capsule Take 1 capsule (40 mg total) by mouth 2 (two) times daily before a meal. 12/03/23   Mansouraty, Albino Alu., MD  glucose blood (ACCU-CHEK GUIDE) test strip Use as instructed twice daily. 05/31/22   Jerrlyn Morel, NP  lisinopril -hydrochlorothiazide  (ZESTORETIC ) 20-25 MG tablet TAKE 1 TABLET BY MOUTH DAILY. 05/08/24   Jerrlyn Morel, NP  metFORMIN  (GLUCOPHAGE ) 1000 MG tablet TAKE 1 TABLET BY MOUTH TWICE A DAY WITH A MEAL 05/08/24   Jerrlyn Morel, NP  sildenafil  (VIAGRA ) 100 MG tablet TAKE 1/2 TABLET BY MOUTH AS NEEDED FOR ERECTILE DYSFUNCTION 03/30/24   Jerrlyn Morel, NP  sildenafil  (VIAGRA ) 100 MG tablet Take 0.5 tablets (50 mg total) by mouth as needed for erectile dysfunction. 03/18/24   Lorel Roes, NP    Family History Family History  Problem Relation Age of Onset   Diabetes Mother    Hypertension Mother    Diabetes Sister    Colon cancer Paternal Uncle    Esophageal cancer Neg Hx    Inflammatory bowel disease Neg Hx    Liver disease Neg Hx    Pancreatic cancer Neg Hx    Stomach cancer Neg Hx    Colon polyps Neg Hx    Rectal cancer Neg Hx     Social History Social History   Tobacco Use   Smoking status: Some Days    Current packs/day: 0.00    Types: Cigarettes    Last attempt to quit: 2018    Years since quitting: 7.4   Smokeless tobacco: Never  Vaping Use   Vaping status: Never Used  Substance Use Topics   Alcohol use: No   Drug use: Not Currently    Types: "Crack" cocaine    Comment: stopped in 2017     Allergies   Crab [shellfish allergy]   Review of Systems Review of Systems  Skin:  Positive for wound.  All other systems  reviewed and are negative.    Physical Exam Triage Vital Signs ED Triage Vitals  Encounter Vitals Group     BP 05/31/24 1431 (!) 156/78     Systolic BP Percentile --      Diastolic BP Percentile --      Pulse Rate 05/31/24 1431 60     Resp 05/31/24 1431 16     Temp 05/31/24 1431 98.3 F (36.8 C)     Temp Source 05/31/24 1431 Oral     SpO2 05/31/24 1431 97 %     Weight --      Height --  Head Circumference --      Peak Flow --      Pain Score 05/31/24 1430 3     Pain Loc --      Pain Education --      Exclude from Growth Chart --    No data found.  Updated Vital Signs BP (!) 156/78 (BP Location: Left Arm)   Pulse 60   Temp 98.3 F (36.8 C) (Oral)   Resp 16   SpO2 97%   Visual Acuity Right Eye Distance:   Left Eye Distance:   Bilateral Distance:    Right Eye Near:   Left Eye Near:    Bilateral Near:     Physical Exam Vitals reviewed.  Cardiovascular:     Rate and Rhythm: Normal rate.  Pulmonary:     Effort: Pulmonary effort is normal.  Abdominal:     General: Abdomen is flat.  Musculoskeletal:        General: Normal range of motion.  Skin:    General: Skin is warm.     Comments: 2 cm laceration frontal scalp gaping  Neurological:     General: No focal deficit present.     Mental Status: He is alert.      UC Treatments / Results  Labs (all labs ordered are listed, but only abnormal results are displayed) Labs Reviewed - No data to display  EKG   Radiology No results found.  Procedures Laceration Repair  Date/Time: 05/31/2024 3:10 PM  Performed by: Sandi Crosby, PA-C Authorized by: Sandi Crosby, PA-C   Consent:    Consent obtained:  Verbal   Consent given by:  Patient   Risks, benefits, and alternatives were discussed: yes     Risks discussed:  Infection Universal protocol:    Procedure explained and questions answered to patient or proxy's satisfaction: no     Patient identity confirmed:  Verbally with patient Anesthesia:     Anesthesia method:  Local infiltration   Local anesthetic:  Lidocaine  2% w/o epi Laceration details:    Location:  Scalp   Scalp location:  Frontal   Length (cm):  2 Exploration:    Wound exploration: entire depth of wound visualized   Treatment:    Area cleansed with:  Povidone-iodine    Amount of cleaning:  Standard   Debridement:  None Skin repair:    Repair method:  Staples   Number of staples:  2 Approximation:    Approximation:  Close  (including critical care time)  Medications Ordered in UC Medications  lidocaine  (PF) (XYLOCAINE ) 1 % injection 5 mL (has no administration in time range)    Initial Impression / Assessment and Plan / UC Course  I have reviewed the triage vital signs and the nursing notes.  Pertinent labs & imaging results that were available during my care of the patient were reviewed by me and considered in my medical decision making (see chart for details).      Final Clinical Impressions(s) / UC Diagnoses   Final diagnoses:  Laceration of scalp, initial encounter   Discharge Instructions   None    ED Prescriptions   None    PDMP not reviewed this encounter.  An After Visit Summary was printed and given to the patient.       Sandi Crosby, PA-C 05/31/24 1512

## 2024-06-09 ENCOUNTER — Ambulatory Visit (HOSPITAL_COMMUNITY): Admission: EM | Admit: 2024-06-09 | Discharge: 2024-06-09 | Disposition: A | Discharge status: Home / Self Care

## 2024-06-09 ENCOUNTER — Encounter (HOSPITAL_COMMUNITY): Payer: Self-pay

## 2024-06-09 DIAGNOSIS — Z4802 Encounter for removal of sutures: Secondary | ICD-10-CM | POA: Diagnosis not present

## 2024-06-09 NOTE — ED Triage Notes (Signed)
 Patient here today for staple removal on his head. Patient is doing well.

## 2024-06-10 DIAGNOSIS — G5603 Carpal tunnel syndrome, bilateral upper limbs: Secondary | ICD-10-CM | POA: Diagnosis not present

## 2024-06-17 ENCOUNTER — Ambulatory Visit (INDEPENDENT_AMBULATORY_CARE_PROVIDER_SITE_OTHER): Admitting: Podiatry

## 2024-06-17 DIAGNOSIS — M79609 Pain in unspecified limb: Secondary | ICD-10-CM | POA: Diagnosis not present

## 2024-06-17 DIAGNOSIS — E1169 Type 2 diabetes mellitus with other specified complication: Secondary | ICD-10-CM

## 2024-06-17 DIAGNOSIS — B351 Tinea unguium: Secondary | ICD-10-CM | POA: Diagnosis not present

## 2024-06-17 NOTE — Progress Notes (Signed)
.  [email or web address]  Subjective:  Patient ID: Kyle Merritt, male    DOB: [birth year],  MRN: [id number]  Kyle Merritt presents to clinic today for preventative diabetic foot care and painful thick toenails that are difficult to trim. Pain interferes with ambulation. Aggravating factors include wearing enclosed shoe gear. Pain is relieved with periodic professional debridement. He states he is having his new home [place].  Chief Complaint  Patient presents with   [facility]    RM#15 DFC A1c 7.3 hasn't checked blood sugar this am.   New problem(s): None.   PCP is Oley Bascom RAMAN, NP. ARNETTA 05/06/2024.  Allergies  Allergen Reactions   Crab [Shellfish Allergy]     itching    Review of Systems: Negative except as noted in the HPI.  Objective: No changes noted in today's physical examination. There were no vitals filed for this visit. Kyle Merritt is a pleasant 63 y.o. male in NAD. AAO x 3.  Vascular Examination: Capillary refill time immediate b/l. Palpable pedal pulses. Pedal hair present b/l. No pain with calf compression b/l. Skin temperature gradient WNL b/l. No cyanosis or clubbing b/l. No ischemia or gangrene noted b/l. No edema noted b/l LE.  Neurological Examination: Sensation grossly intact b/l with 10 gram monofilament. Vibratory sensation intact b/l.   Dermatological Examination: Pedal skin with normal turgor, texture and tone b/l.  No open wounds. No interdigital macerations.   Toenails 1-5 b/l thick, discolored, elongated with subungual debris and pain on dorsal palpation.   No hyperkeratotic nor porokeratotic lesions present on today's visit.  Musculoskeletal Examination: Normal muscle strength 5/5 to all lower extremity muscle groups bilaterally. HAV with bunion deformity noted b/l LE.SABRA No pain, crepitus or joint limitation noted with ROM b/l LE.  Patient ambulates independently without assistive aids.  Radiographs: None  Last A1c:      Latest Ref Rng & Units 02/07/2024    10:22 AM 11/01/2023    9:58 AM 07/25/2023    9:20 AM  Hemoglobin A1C  Hemoglobin-A1c 4.0 - 5.6 % 7.7  6.3  6.7    Assessment/Plan: 1. Pain due to onychomycosis of nail   2. Type 2 diabetes mellitus with other specified complication, without long-term current use of insulin  [facility])     Consent given for treatment. Patient examined. All patient's and/or POA's questions/concerns addressed on today's visit. Toenails 1-5 debrided in length and girth without incident. Continue foot and shoe inspections daily. Monitor blood glucose per PCP/Endocrinologist's recommendations. Continue soft, supportive shoe gear daily. Report any pedal injuries to medical professional. Call office if there are any questions/concerns. -Patient/POA to call should there be question/concern in the interim.   Return in about 3 months (around 09/17/2024).  Delon LITTIE Merlin, DPM      [place] LOCATION: [address], [place] [address]                   Office [phone]   [facility] LOCATION: [address] [place], [place] [address] Office [phone]

## 2024-06-21 ENCOUNTER — Encounter: Payer: Self-pay | Admitting: Podiatry

## 2024-07-01 ENCOUNTER — Encounter: Admitting: Neurology

## 2024-07-08 ENCOUNTER — Other Ambulatory Visit: Payer: Self-pay | Admitting: Nurse Practitioner

## 2024-07-08 DIAGNOSIS — E785 Hyperlipidemia, unspecified: Secondary | ICD-10-CM

## 2024-07-08 DIAGNOSIS — E1169 Type 2 diabetes mellitus with other specified complication: Secondary | ICD-10-CM

## 2024-07-16 DIAGNOSIS — G5602 Carpal tunnel syndrome, left upper limb: Secondary | ICD-10-CM | POA: Diagnosis not present

## 2024-07-31 ENCOUNTER — Other Ambulatory Visit: Payer: Self-pay | Admitting: Nurse Practitioner

## 2024-07-31 DIAGNOSIS — I10 Essential (primary) hypertension: Secondary | ICD-10-CM

## 2024-07-31 DIAGNOSIS — E1169 Type 2 diabetes mellitus with other specified complication: Secondary | ICD-10-CM

## 2024-08-06 ENCOUNTER — Ambulatory Visit (INDEPENDENT_AMBULATORY_CARE_PROVIDER_SITE_OTHER): Payer: Self-pay | Admitting: Nurse Practitioner

## 2024-08-06 ENCOUNTER — Encounter: Payer: Self-pay | Admitting: Nurse Practitioner

## 2024-08-06 VITALS — BP 114/74 | HR 82 | Temp 97.5°F | Wt 229.0 lb

## 2024-08-06 DIAGNOSIS — Z13220 Encounter for screening for lipoid disorders: Secondary | ICD-10-CM

## 2024-08-06 DIAGNOSIS — E1169 Type 2 diabetes mellitus with other specified complication: Secondary | ICD-10-CM

## 2024-08-06 DIAGNOSIS — Z125 Encounter for screening for malignant neoplasm of prostate: Secondary | ICD-10-CM | POA: Diagnosis not present

## 2024-08-06 LAB — POCT GLYCOSYLATED HEMOGLOBIN (HGB A1C): Hemoglobin A1C: 6.5 % — AB (ref 4.0–5.6)

## 2024-08-06 NOTE — Progress Notes (Signed)
 Subjective   Patient ID: Kyle Merritt, male    DOB: 1961/05/30, 63 y.o.   MRN: [id number]  Chief Complaint  Patient presents with   Diabetes    Patient stated that his numbers are good    Referring provider: Oley Bascom RAMAN, NP  Kyle Merritt is a 63 y.o. male with Past Medical History: No date: Allergy No date: Arthritis No date: Cataract     Comment:  R eye No date: Colon cancer (HCC) No date: Diabetes mellitus without complication (HCC) No date: Glaucoma No date: High cholesterol No date: History of colon cancer 2004: History of colon cancer No date: Hypertension No date: Substance abuse [facility])     Comment:  2017 stopped   HPI  Patient presents for follow up of diabetes. Current symptoms include: hyperglycemia and weight gain. Patient denies foot ulcerations, hypoglycemia , increased appetite, nausea, paresthesia of the feet, polydipsia and polyuria. Evaluation to date has included: fasting blood sugar, fasting lipid panel, hemoglobin A1C and microalbuminuria.  Home sugars: not checking. Current treatment: more intensive attention to diet which has not been effective, Continued metformin , jardiance , and ozempic  which has been effective, Continued statin which has been effective and Continued ACE inhibitor/ARB which has been effective. A1C is 6.5 at last visit.  Patient is followed by nephrology.    Patient is not checking home blood sugars.   Home blood sugar records: patient does not check sugars How often is blood sugars being checked:  Current symptoms/problems include none and have been worsening.     Allergies  Allergen Reactions   Crab [Shellfish Allergy]     itching    Immunization History  Administered Date(s) Administered   Influenza Whole 12/08/2008, 02/03/2010   Influenza, Seasonal, Injecte, Preservative Fre 02/07/2024   Influenza,inj,Quad PF,6+ Mos 10/17/2018, 08/26/2019, 12/12/2020, 11/09/2021, 10/19/2022   PNEUMOCOCCAL CONJUGATE-20 11/09/2021    Pneumococcal Polysaccharide-23 03/24/2009, 01/29/2018   Td 12/08/2008   Tdap 01/29/2018    Tobacco History: Social History   Tobacco Use  Smoking Status Some Days   Current packs/day: 0.00   Types: Cigarettes   Last attempt to quit: 2018   Years since quitting: 7.6  Smokeless Tobacco Never   Ready to quit: Not Answered Counseling given: Yes   Outpatient Encounter Medications as of 08/06/2024  Medication Sig   Accu-Chek Softclix Lancets lancets Use as directed twice daily.   atorvastatin  (LIPITOR) 40 MG tablet TAKE 1 TABLET BY MOUTH EVERY EVENING   blood glucose meter kit and supplies KIT Dispense based on patient and insurance preference. Use up to four times daily as directed. (FOR ICD-10  E11.9).   Blood Pressure Monitor DEVI Use to check blood pressure daily   dorzolamide-timolol  (COSOPT) 2-0.5 % ophthalmic solution Place 1 drop into the right eye 2 (two) times daily.   esomeprazole  (NEXIUM ) 40 MG capsule Take 1 capsule (40 mg total) by mouth 2 (two) times daily before a meal.   glucose blood (ACCU-CHEK GUIDE) test strip Use as instructed twice daily.   JARDIANCE  25 MG TABS tablet TAKE 1 TABLET BY MOUTH DAILY BEFORE BREAKFAST   lisinopril -hydrochlorothiazide  (ZESTORETIC ) 20-25 MG tablet TAKE 1 TABLET BY MOUTH DAILY   metFORMIN  (GLUCOPHAGE ) 1000 MG tablet TAKE 1 TABLET BY MOUTH TWICE A DAY WITH A MEAL   sildenafil  (VIAGRA ) 100 MG tablet Take 0.5 tablets (50 mg total) by mouth as needed for erectile dysfunction.   sildenafil  (VIAGRA ) 100 MG tablet TAKE 1/2 TABLET BY MOUTH AS NEEDED FOR ERECTILE DYSFUNCTION  TRULICITY  0.75 MG/0.5ML SOAJ INJECT 0.75MG  INTO THE SKIN ONCE WEEK   No facility-administered encounter medications on file as of 08/06/2024.    Review of Systems  Review of Systems  Constitutional: Negative.   HENT: Negative.    Cardiovascular: Negative.   Gastrointestinal: Negative.   Allergic/Immunologic: Negative.   Neurological: Negative.    Psychiatric/Behavioral: Negative.       Objective:   BP 114/74   Pulse 82   Temp (!) 97.5 F (36.4 C) (Oral)   Wt 229 lb (103.9 kg)   SpO2 100%   BMI 31.06 kg/m   Wt Readings from Last 5 Encounters:  08/06/24 229 lb (103.9 kg)  05/06/24 225 lb 9.6 oz (102.3 kg)  03/03/24 231 lb (104.8 kg)  02/07/24 231 lb (104.8 kg)  12/03/23 218 lb (98.9 kg)     Physical Exam Vitals and nursing note reviewed.  Constitutional:      General: He is not in acute distress.    Appearance: He is well-developed.  Cardiovascular:     Rate and Rhythm: Normal rate and regular rhythm.  Pulmonary:     Effort: Pulmonary effort is normal.     Breath sounds: Normal breath sounds.  Skin:    General: Skin is warm and dry.  Neurological:     Mental Status: He is alert and oriented to person, place, and time.       Assessment & Plan:   Type 2 diabetes mellitus with other specified complication, without long-term current use of insulin  (HCC) -     POCT glycosylated hemoglobin (Hb A1C) -     CBC; Future -     Comprehensive metabolic panel with GFR; Future -     PSA; Future -     Lipid panel; Future  Prostate cancer screening -     PSA; Future  Lipid screening -     Lipid panel; Future     Return in about 3 months (around 11/06/2024).   Bascom GORMAN Borer, NP 08/06/2024

## 2024-08-10 ENCOUNTER — Other Ambulatory Visit: Payer: Self-pay

## 2024-08-17 ENCOUNTER — Other Ambulatory Visit: Payer: Self-pay

## 2024-08-17 DIAGNOSIS — Z125 Encounter for screening for malignant neoplasm of prostate: Secondary | ICD-10-CM

## 2024-08-17 DIAGNOSIS — Z13220 Encounter for screening for lipoid disorders: Secondary | ICD-10-CM

## 2024-08-17 DIAGNOSIS — E1169 Type 2 diabetes mellitus with other specified complication: Secondary | ICD-10-CM | POA: Diagnosis not present

## 2024-08-18 LAB — CBC
Hematocrit: 41.8 % (ref 37.5–51.0)
Hemoglobin: 13.1 g/dL (ref 13.0–17.7)
MCH: 26.6 pg (ref 26.6–33.0)
MCHC: 31.3 g/dL — ABNORMAL LOW (ref 31.5–35.7)
MCV: 85 fL (ref 79–97)
Platelets: 331 x10E3/uL (ref 150–450)
RBC: 4.92 x10E6/uL (ref 4.14–5.80)
RDW: 14.4 % (ref 11.6–15.4)
WBC: 5.4 x10E3/uL (ref 3.4–10.8)

## 2024-08-18 LAB — COMPREHENSIVE METABOLIC PANEL WITH GFR
ALT: 17 IU/L (ref 0–44)
AST: 15 IU/L (ref 0–40)
Albumin: 4.5 g/dL (ref 3.9–4.9)
Alkaline Phosphatase: 121 IU/L (ref 44–121)
BUN/Creatinine Ratio: 18 (ref 10–24)
BUN: 27 mg/dL (ref 8–27)
Bilirubin Total: 0.6 mg/dL (ref 0.0–1.2)
CO2: 22 mmol/L (ref 20–29)
Calcium: 9.8 mg/dL (ref 8.6–10.2)
Chloride: 100 mmol/L (ref 96–106)
Creatinine, Ser: 1.53 mg/dL — ABNORMAL HIGH (ref 0.76–1.27)
Globulin, Total: 2.8 g/dL (ref 1.5–4.5)
Glucose: 100 mg/dL — ABNORMAL HIGH (ref 70–99)
Potassium: 4 mmol/L (ref 3.5–5.2)
Sodium: 141 mmol/L (ref 134–144)
Total Protein: 7.3 g/dL (ref 6.0–8.5)
eGFR: 50 mL/min/1.73 — ABNORMAL LOW (ref >59)

## 2024-08-18 LAB — LIPID PANEL
Chol/HDL Ratio: 2.6 ratio (ref 0.0–5.0)
Cholesterol, Total: 152 mg/dL (ref 100–199)
HDL: 58 mg/dL (ref >39)
LDL Chol Calc (NIH): 80 mg/dL (ref 0–99)
Triglycerides: 71 mg/dL (ref 0–149)
VLDL Cholesterol Cal: 14 mg/dL (ref 5–40)

## 2024-08-18 LAB — PSA: Prostate Specific Ag, Serum: 0.9 ng/mL (ref 0.0–4.0)

## 2024-08-19 ENCOUNTER — Ambulatory Visit: Payer: Self-pay | Admitting: Nurse Practitioner

## 2024-08-31 ENCOUNTER — Other Ambulatory Visit: Payer: Self-pay | Admitting: Nurse Practitioner

## 2024-08-31 DIAGNOSIS — E1169 Type 2 diabetes mellitus with other specified complication: Secondary | ICD-10-CM

## 2024-09-04 ENCOUNTER — Other Ambulatory Visit: Payer: Self-pay | Admitting: Nurse Practitioner

## 2024-09-04 DIAGNOSIS — E785 Hyperlipidemia, unspecified: Secondary | ICD-10-CM

## 2024-09-18 ENCOUNTER — Encounter: Payer: Self-pay | Admitting: Gastroenterology

## 2024-09-30 ENCOUNTER — Ambulatory Visit (INDEPENDENT_AMBULATORY_CARE_PROVIDER_SITE_OTHER): Admitting: Podiatry

## 2024-09-30 ENCOUNTER — Encounter: Payer: Self-pay | Admitting: Podiatry

## 2024-09-30 DIAGNOSIS — B351 Tinea unguium: Secondary | ICD-10-CM | POA: Diagnosis not present

## 2024-09-30 DIAGNOSIS — E1169 Type 2 diabetes mellitus with other specified complication: Secondary | ICD-10-CM

## 2024-09-30 DIAGNOSIS — M79609 Pain in unspecified limb: Secondary | ICD-10-CM

## 2024-10-02 ENCOUNTER — Ambulatory Visit (INDEPENDENT_AMBULATORY_CARE_PROVIDER_SITE_OTHER)

## 2024-10-02 DIAGNOSIS — M722 Plantar fascial fibromatosis: Secondary | ICD-10-CM

## 2024-10-02 DIAGNOSIS — M216X1 Other acquired deformities of right foot: Secondary | ICD-10-CM | POA: Diagnosis not present

## 2024-10-02 DIAGNOSIS — M216X2 Other acquired deformities of left foot: Secondary | ICD-10-CM | POA: Diagnosis not present

## 2024-10-02 DIAGNOSIS — M2142 Flat foot [pes planus] (acquired), left foot: Secondary | ICD-10-CM | POA: Diagnosis not present

## 2024-10-02 DIAGNOSIS — M2141 Flat foot [pes planus] (acquired), right foot: Secondary | ICD-10-CM

## 2024-10-02 DIAGNOSIS — M79672 Pain in left foot: Secondary | ICD-10-CM | POA: Diagnosis not present

## 2024-10-02 DIAGNOSIS — M25572 Pain in left ankle and joints of left foot: Secondary | ICD-10-CM | POA: Diagnosis not present

## 2024-10-02 MED ORDER — TRIAMCINOLONE ACETONIDE 10 MG/ML IJ SUSP
5.0000 mg | Freq: Once | INTRAMUSCULAR | Status: AC
  Administered 2024-10-02: 5 mg via INTRAMUSCULAR

## 2024-10-02 NOTE — Progress Notes (Signed)
 Subjective:  Patient ID: Kyle Merritt, male    DOB: Aug 26, 1961,  MRN: [id number]  Chief Complaint  Patient presents with   Plantar Fasciitis    Rm23 Post left heel pain for 1 month no treatment /diabetic A1C 6.5/ sore and tender to touch and put pressure on it.    Discussed the use of AI scribe software for clinical note transcription with the patient, who gave verbal consent to proceed.  History of Present Illness He is a 63 year old male who presents with bilateral heel pain.  He experiences pain in the back of both heels, more pronounced on the left side, for about one to one and a half months. 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 He uses pain medication with unspecified relief. Soreness is present on both sides of the heel, with occasional pain in other areas of the foot.  He describes the pain as achy, sore.      Objective:    Physical Exam MUSCULOSKELETAL: Achilles tendon with good strength, no pain on manipulation. Soreness on both sides of the heel and on toe inward pointing. Soreness on lateral aspect of heel.  Constitutional Well developed. Well nourished. Oriented to person, place, and time.  Vascular Dorsalis pedis pulses faintly bilaterally. Posterior tibial pulses faintly bilaterally. Capillary refill normal to all digits.  No cyanosis or clubbing noted. Pedal hair growth normal.  Neurologic Normal speech. Epicritic sensation to light touch grossly intact bilaterally. Negative tinel sign at tarsal tunnel bilaterally.   Dermatologic Skin texture and turgor are within normal limits.  No open wounds No skin lesions.  Musculoskeletal: 5 out of 5 muscle strength in all major muscle groups.  There is pain with resisted inversion.  Mild pes planus foot shape that is mostly reducible.  Gastrosoleus equinus is present.  Pain to palpation of the navicular tuberosity, sinus tarsi,  distal posterior tibial tendon.  Hindfoot deformity mostly reducible.  Subtalar joint range of motion increased in eversion, mild pain with movement.  Correction of hindfoot reveals moderate forefoot varus.   Radiographs: Taken and reviewed 3 x-rays of the left foot.  These demonstrate a pes planus foot shape with decreased calcaneal inclination, increased talar declination.  There is minor joint space narrowing of the posterior subtalar joint, talonavicular joint consistent with early arthritis. No acute osseous findings such as fracture or dislocation.      Assessment:   1. Pain of left heel   2. Pes planus of both feet   3. Acquired equinus deformity of both feet   4. Sinus tarsi syndrome of left ankle      Plan:  Patient was evaluated and treated and all questions answered.  Assessment and Plan Assessment & Plan Left foot pain due to acquired flat foot (pes planus) Pain likely from tendon inefficacy causing strain on foot structures, consistent with early flat foot changes. - Discussed recommendation for OTC orthotics to support the arch. - Advised on purchasing stiff-soled shoes for better arch support. - Discussed that if the patient fails orthotic and conservative therapy, surgical treatments are available and can be discussed.  If required, fusion of the hindfoot would likely be indicated to help resolve his pain.  Ankle and calc axial XR as well as dvanced imaging would be required for workup.  Patient would need to decrease his smoking.  Sinus tarsi syndrome, left foot -Patient has focal tenderness to  palpation of the sinus tarsi. - Discussed treatment options, recommend STJ joint injection as described below.  Patient elected to proceed.  Procedure: Injection small joint Location: Left subtalar joint Skin Prep: alcohol Injectate: 0.5 cc 0.5% marcaine  plain, 0.5 cc of 1% Lidocaine , 0.5 cc kenalog 10. Disposition: Patient tolerated procedure well. Injection site dressed  with a band-aid.  RTC PRN  Prentice Ovens, DPM

## 2024-10-05 NOTE — Progress Notes (Signed)
  Subjective:  Patient ID: Kyle Merritt, male    DOB: 03-24-1961,  MRN: [id number]  63 y.o. male presents to clinic with  preventative diabetic foot care for painful mycotic toenails x 10 which interfere with daily activities. Pain is relieved with periodic professional debridement.  Chief Complaint  Patient presents with   Diabetes    [facility] NIDDM A1C 6.5 Toenail trim. LOV with 07/27/24.    New problem(s): None   PCP is Oley Bascom RAMAN, NP.  Allergies  Allergen Reactions   Crab [Shellfish Allergy]     itching    Review of Systems: Negative except as noted in the HPI.   Objective:  Kyle Merritt is a pleasant 63 y.o. male in NAD. AAO x 3.  Vascular Examination: Vascular status intact b/l with palpable pedal pulses. Pedal hair sparse. CFT immediate b/l. No edema. No pain with calf compression b/l. Skin temperature gradient WNL b/l. No ischemia or gangrene noted b/l LE. No cyanosis or clubbing noted b/l LE.  Neurological Examination: Sensation grossly intact b/l with 10 gram monofilament. Vibratory sensation intact b/l.   Dermatological Examination: Pedal skin with normal turgor, texture and tone b/l. Toenails 1-5 b/l thick, discolored, elongated with subungual debris and pain on dorsal palpation. No hyperkeratotic lesions noted b/l.   Musculoskeletal Examination: Muscle strength 5/5 to b/l LE. HAV with bunion deformity noted b/l LE. Patient ambulates independent of any assistive aids.  Radiographs: None  Last A1c:      Latest Ref Rng & Units 08/06/2024    9:12 AM 02/07/2024   10:22 AM 11/01/2023    9:58 AM  Hemoglobin A1C  Hemoglobin-A1c 4.0 - 5.6 % 6.5  7.7  6.3      Assessment:   1. Pain due to onychomycosis of nail   2. Type 2 diabetes mellitus with other specified complication, without long-term current use of insulin  [facility])    Plan:  Patient was evaluated and treated. All patient's and/or POA's questions/concerns addressed on today's visit. Mycotic toenails 1-5  debrided in length and girth without incident.  Continue daily foot inspections and monitor blood glucose per PCP/Endocrinologist's recommendations.Continue soft, supportive shoe gear daily. Report any pedal injuries to medical professional. Call office if there are any quesitons/concerns. -Patient/POA to call should there be question/concern in the interim.  Return in about 3 months (around 12/31/2024).  Kyle Merritt Merlin, DPM      [place] LOCATION: [address], [place] [address]                   Office [phone]   [facility] LOCATION: [address] [place], [place] [address] Office [phone]

## 2024-10-29 ENCOUNTER — Other Ambulatory Visit: Payer: Self-pay | Admitting: Nurse Practitioner

## 2024-10-29 DIAGNOSIS — E1169 Type 2 diabetes mellitus with other specified complication: Secondary | ICD-10-CM

## 2024-10-29 DIAGNOSIS — I10 Essential (primary) hypertension: Secondary | ICD-10-CM

## 2024-10-29 DIAGNOSIS — E785 Hyperlipidemia, unspecified: Secondary | ICD-10-CM

## 2024-11-02 ENCOUNTER — Other Ambulatory Visit: Payer: Self-pay | Admitting: Nurse Practitioner

## 2024-11-02 DIAGNOSIS — R142 Eructation: Secondary | ICD-10-CM

## 2024-11-06 ENCOUNTER — Encounter: Payer: Self-pay | Admitting: Nurse Practitioner

## 2024-11-06 ENCOUNTER — Ambulatory Visit (INDEPENDENT_AMBULATORY_CARE_PROVIDER_SITE_OTHER): Payer: Self-pay | Admitting: Nurse Practitioner

## 2024-11-06 VITALS — BP 136/89 | HR 83 | Ht 72.0 in | Wt 229.0 lb

## 2024-11-06 DIAGNOSIS — G8929 Other chronic pain: Secondary | ICD-10-CM

## 2024-11-06 DIAGNOSIS — M2352 Chronic instability of knee, left knee: Secondary | ICD-10-CM

## 2024-11-06 DIAGNOSIS — M25562 Pain in left knee: Secondary | ICD-10-CM | POA: Diagnosis not present

## 2024-11-06 DIAGNOSIS — E1169 Type 2 diabetes mellitus with other specified complication: Secondary | ICD-10-CM | POA: Diagnosis not present

## 2024-11-06 LAB — POCT GLYCOSYLATED HEMOGLOBIN (HGB A1C): Hemoglobin A1C: 6.6 % — AB (ref 4.0–5.6)

## 2024-11-06 NOTE — Progress Notes (Signed)
 Subjective   Patient ID: Kyle Merritt, male    DOB: 04-07-1961, 63 y.o.   MRN: [id number]  Chief Complaint  Patient presents with   3 month follow up   Diabetes   Hyperlipidemia    Referring provider: Oley Bascom RAMAN, NP  Kyle Merritt is a 63 y.o. male with Past Medical History: No date: Allergy No date: Arthritis No date: Cataract     Comment:  R eye No date: Colon cancer (HCC) No date: Diabetes mellitus without complication (HCC) No date: Glaucoma No date: High cholesterol No date: History of colon cancer 2004: History of colon cancer No date: Hypertension No date: Substance abuse [facility])     Comment:  2017 stopped   HPI  Patient presents for follow up of diabetes. Current symptoms include: hyperglycemia and weight gain. Patient denies foot ulcerations, hypoglycemia , increased appetite, nausea, paresthesia of the feet, polydipsia and polyuria. Evaluation to date has included: fasting blood sugar, fasting lipid panel, hemoglobin A1C and microalbuminuria.  Home sugars: not checking. Current treatment: more intensive attention to diet which has not been effective, Continued metformin , jardiance , and ozempic  which has been effective, Continued statin which has been effective and Continued ACE inhibitor/ARB which has been effective. A1C is 6.6 at last visit.  Patient is followed by nephrology.    Patient is not checking home blood sugars.   Home blood sugar records: patient does not check sugars How often is blood sugars being checked:  Current symptoms/problems include none and have been worsening.        Allergies  Allergen Reactions   Crab [Shellfish Allergy]     itching    Immunization History  Administered Date(s) Administered   Influenza Whole 12/08/2008, 02/03/2010   Influenza, Seasonal, Injecte, Preservative Fre 02/07/2024   Influenza,inj,Quad PF,6+ Mos 10/17/2018, 08/26/2019, 12/12/2020, 11/09/2021, 10/19/2022   PNEUMOCOCCAL CONJUGATE-20 11/09/2021    Pneumococcal Polysaccharide-23 03/24/2009, 01/29/2018   Td 12/08/2008   Tdap 01/29/2018    Tobacco History: Social History   Tobacco Use  Smoking Status Some Days   Current packs/day: 0.00   Types: Cigarettes   Last attempt to quit: 2018   Years since quitting: 7.8  Smokeless Tobacco Never   Ready to quit: Not Answered Counseling given: Not Answered   Outpatient Encounter Medications as of 11/06/2024  Medication Sig   Accu-Chek Softclix Lancets lancets Use as directed twice daily.   atorvastatin  (LIPITOR) 40 MG tablet TAKE 1 TABLET BY MOUTH EVERY EVENING   blood glucose meter kit and supplies KIT Dispense based on patient and insurance preference. Use up to four times daily as directed. (FOR ICD-10  E11.9).   Blood Pressure Monitor DEVI Use to check blood pressure daily   dorzolamide-timolol  (COSOPT) 2-0.5 % ophthalmic solution Place 1 drop into the right eye 2 (two) times daily.   esomeprazole  (NEXIUM ) 40 MG capsule TAKE 1 CAPSULE BY MOUTH TWICE A DAY BEFORE A MEAL   glucose blood (ACCU-CHEK GUIDE) test strip Use as instructed twice daily.   JARDIANCE  25 MG TABS tablet TAKE 1 TABLET BY MOUTH DAILY BEFORE BREAKFAST   lisinopril -hydrochlorothiazide  (ZESTORETIC ) 20-25 MG tablet TAKE 1 TABLET BY MOUTH DAILY   metFORMIN  (GLUCOPHAGE ) 1000 MG tablet TAKE 1 TABLET BY MOUTH TWICE A DAY WITH A MEAL   sildenafil  (VIAGRA ) 100 MG tablet Take 0.5 tablets (50 mg total) by mouth as needed for erectile dysfunction.   sildenafil  (VIAGRA ) 100 MG tablet TAKE 1/2 TABLET BY MOUTH AS NEEDED FOR ERECTILE DYSFUNCTION   TRULICITY   0.75 MG/0.5ML SOAJ INJECT 0.75MG  INTO THE SKIN ONCE WEEK   No facility-administered encounter medications on file as of 11/06/2024.    Review of Systems  Review of Systems  Constitutional: Negative.   HENT: Negative.    Cardiovascular: Negative.   Gastrointestinal: Negative.   Allergic/Immunologic: Negative.   Neurological: Negative.   Psychiatric/Behavioral:  Negative.       Objective:   BP 136/89 (BP Location: Left Arm, Patient Position: Sitting, Cuff Size: Large)   Pulse 83   Ht 6' (1.829 m)   Wt 229 lb (103.9 kg)   SpO2 99%   BMI 31.06 kg/m   Wt Readings from Last 5 Encounters:  11/06/24 229 lb (103.9 kg)  08/06/24 229 lb (103.9 kg)  05/06/24 225 lb 9.6 oz (102.3 kg)  03/03/24 231 lb (104.8 kg)  02/07/24 231 lb (104.8 kg)     Physical Exam Vitals and nursing note reviewed.  Constitutional:      General: He is not in acute distress.    Appearance: He is well-developed.  Cardiovascular:     Rate and Rhythm: Normal rate and regular rhythm.  Pulmonary:     Effort: Pulmonary effort is normal.     Breath sounds: Normal breath sounds.  Skin:    General: Skin is warm and dry.  Neurological:     Mental Status: He is alert and oriented to person, place, and time.       Assessment & Plan:   Chronic pain of left knee -     Ambulatory referral to Orthopedic Surgery  Type 2 diabetes mellitus with other specified complication, without long-term current use of insulin  (HCC) -     POCT glycosylated hemoglobin (Hb A1C)  Recurrent left knee instability -     Ambulatory referral to Orthopedic Surgery     Return in about 3 months (around 02/06/2025).     Bascom GORMAN Borer, NP 11/06/2024

## 2024-11-24 ENCOUNTER — Other Ambulatory Visit: Payer: Self-pay | Admitting: Nurse Practitioner

## 2024-11-24 DIAGNOSIS — E1169 Type 2 diabetes mellitus with other specified complication: Secondary | ICD-10-CM

## 2024-12-03 ENCOUNTER — Telehealth: Payer: Self-pay

## 2024-12-03 ENCOUNTER — Ambulatory Visit: Admitting: Orthopaedic Surgery

## 2024-12-03 ENCOUNTER — Other Ambulatory Visit

## 2024-12-03 DIAGNOSIS — M1712 Unilateral primary osteoarthritis, left knee: Secondary | ICD-10-CM

## 2024-12-03 NOTE — Telephone Encounter (Signed)
 Patient given surgical clearance forms for PCP and cardiologist. Aware that we must receive clearance forms back before being able to proceed with scheduling surgery.

## 2024-12-03 NOTE — Progress Notes (Signed)
 Office Visit Note   Patient: Kyle Merritt           Date of Birth: [birth year]           MRN: [id number] Visit Date: 12/03/2024              Requested by: Oley Bascom RAMAN, NP [phone] [address] [place],  [place] [address] PCP: Oley Bascom RAMAN, NP   Assessment & Plan: Visit Diagnoses:  1. Primary osteoarthritis of left knee     Plan: History of Present Illness Kyle Merritt is a 63 year old male with prior right total knee arthroplasty who presents with progressive left knee pain and dysfunction.  He reports progressive aching pain in the left knee, describing it as bone on bone and worn out. The pain is worsened by prolonged activity and increased use, is intermittent, and is associated with frequent giving way of the knee that limits his ability to walk long distances and has reduced his overall mobility.  He previously underwent right total knee arthroplasty with an uncomplicated recovery and good function after physical therapy. He reports reliable transportation and support at home.  He has well-controlled type 2 diabetes with a recent hemoglobin A1c of 6.6 and an abnormal cardiac rhythm managed by his cardiologist. He is not taking anticoagulant therapy.  Exam of the left knee shows pain and crepitus with range of motion.  Range of motion from 0 to 115 degrees.  Collaterals and cruciates are stable.  Assessment and Plan Primary osteoarthritis of left knee Advanced symptomatic osteoarthritis with bone-on-bone changes, significant functional limitation, refractory to conservative management. Total knee arthroplasty indicated. - Planned left total knee arthroplasty after [facility]. - Planned overnight hospital admission postoperatively. - Arranged direct transition to outpatient physical therapy post-discharge. - Obtained preoperative medical clearance from primary care provider and cardiologist. - Provided preoperative paperwork and questionnaire  for completion.  Impression is severe left knee degenerative joint disease secondary to Osteoarthritis.  Patient has attempted conservative treatment for at least 6 consecutive weeks within the past 12 weeks, including but not limited to physical therapy, home exercise program, NSAIDs, activity modification, and/or corticosteroid injections. Despite these efforts, symptoms have not improved or have worsened. Conservative measures have been deemed unsuccessful at this time. After a detailed discussion covering diagnosis and treatment options--including the risks, benefits, alternatives, and potential complications of surgical and nonsurgical management--the patient elected to proceed with surgery  Anticoagulants: No antithrombotic Postop anticoagulation: Aspirin  81 mg Diabetic: No  Nickel allergy: No Prior DVT/PE: No Tobacco use: No Clearances needed for surgery: PCP, cardiologist Anticipated discharge dispo: 23 hr obs, outpatient PT   Follow-Up Instructions: No follow-ups on file.   Orders:  Orders Placed This Encounter  Procedures   XR KNEE 3 VIEW LEFT   No orders of the defined types were placed in this encounter.     Procedures: No procedures performed   Clinical Data: No additional findings.   Subjective: Chief Complaint  Patient presents with   Left Knee - Pain    HPI  Review of Systems  Constitutional: Negative.   HENT: Negative.    Eyes: Negative.   Respiratory: Negative.    Cardiovascular: Negative.   Gastrointestinal: Negative.   Endocrine: Negative.   Genitourinary: Negative.   Skin: Negative.   Allergic/Immunologic: Negative.   Neurological: Negative.   Hematological: Negative.   Psychiatric/Behavioral: Negative.    All other systems reviewed and are negative.    Objective:  Vital Signs: There were no vitals taken for this visit.  Physical Exam Vitals and nursing note reviewed.  Constitutional:      Appearance: He is well-developed.  HENT:      Head: Normocephalic and atraumatic.  Eyes:     Pupils: Pupils are equal, round, and reactive to light.  Pulmonary:     Effort: Pulmonary effort is normal.  Abdominal:     Palpations: Abdomen is soft.  Musculoskeletal:        General: Normal range of motion.     Cervical back: Neck supple.  Skin:    General: Skin is warm.  Neurological:     Mental Status: He is alert and oriented to person, place, and time.  Psychiatric:        Behavior: Behavior normal.        Thought Content: Thought content normal.        Judgment: Judgment normal.     Ortho Exam  Specialty Comments:  No specialty comments available.  Imaging: XR KNEE 3 VIEW LEFT Result Date: 12/03/2024 X-rays of the left knee show advanced tricompartmental osteoarthritis.  Bone-on-bone joint space narrowing.  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 04/17/2023   Cervical radiculopathy 04/17/2023   Paresthesia 01/10/2023   Neck pain on left side 01/10/2023   Hemoglobin A1c less than 7.0% 05/19/2020   Generalized abdominal pain 05/19/2020   Elevated sed rate 01/07/2020   Elevated C-reactive protein (CRP) 01/07/2020   S/P TKR (total knee replacement), right 11/30/2019   Osteoarthritis of right knee 11/26/2019   Constipation 10/06/2018   Olecranon bursitis of left elbow 06/04/2018   Trigger finger, acquired 06/04/2018    Cervical disc disorder with radiculopathy of cervical region 05/08/2018   Other chronic pain 02/04/2018   History of cocaine use 02/04/2018   Hyperlipidemia 02/04/2018   Colon cancer screening 11/29/2014   Knee pain, acute 10/04/2014   Dental abscess 08/02/2014   Diabetes (HCC) 04/29/2014   HTN (hypertension) 04/29/2014   Dyslipidemia 04/29/2014   History of colon cancer 04/29/2014   Back pain 04/29/2014   Erectile dysfunction 04/29/2014   Essential hypertension 07/27/2013   Tobacco abuse 07/27/2013   Pure hypercholesterolemia 12/31/2008   TESTOSTERONE  DEFICIENCY 12/10/2008   GERD 12/08/2008   ONYCHOMYCOSIS, BILATERAL 11/09/2008   DIABETES MELLITUS 11/09/2008   Anemia 11/09/2008   Malignant neoplasm of colon (HCC) 12/24/2002   Past Medical History:  Diagnosis Date   Allergy    Arthritis    Cataract    R eye   Colon cancer (HCC)    Diabetes mellitus without complication (HCC)    Glaucoma    High cholesterol    History of colon cancer    History of colon cancer 2004   Hypertension    Substance abuse (HCC)    2017 stopped    Family History  Problem Relation Age of Onset   Diabetes Mother    Hypertension Mother  Diabetes Sister    Colon cancer Paternal Uncle    Esophageal cancer Neg Hx    Inflammatory bowel disease Neg Hx    Liver disease Neg Hx    Pancreatic cancer Neg Hx    Stomach cancer Neg Hx    Colon polyps Neg Hx    Rectal cancer Neg Hx     Past Surgical History:  Procedure Laterality Date   carpel tunnel      08/29/23   COLON SURGERY     KNEE ARTHROSCOPY Bilateral    TOTAL KNEE ARTHROPLASTY Right 11/30/2019   Procedure: RIGHT TOTAL KNEE ARTHROPLASTY;  Surgeon: Liam Lerner, MD;  Location: [facility];  Service: Orthopedics;  Laterality: Right;   TRIGGER FINGER RELEASE Right 05/22/2019   Social History   Occupational History   Occupation: contractor house  Tobacco Use   Smoking status: Some Days    Current packs/day: 0.00    Average packs/day: 0.3  packs/day    Types: Cigarettes    Last attempt to quit: 2018    Years since quitting: 7.9   Smokeless tobacco: Never  Vaping Use   Vaping status: Never Used  Substance and Sexual Activity   Alcohol use: No   Drug use: Not Currently    Types: Crack cocaine    Comment: stopped in 2017   Sexual activity: Yes

## 2024-12-22 ENCOUNTER — Other Ambulatory Visit: Payer: Self-pay | Admitting: Nurse Practitioner

## 2024-12-22 DIAGNOSIS — E1169 Type 2 diabetes mellitus with other specified complication: Secondary | ICD-10-CM

## 2024-12-22 DIAGNOSIS — E785 Hyperlipidemia, unspecified: Secondary | ICD-10-CM

## 2024-12-22 DIAGNOSIS — R142 Eructation: Secondary | ICD-10-CM

## 2024-12-22 NOTE — Telephone Encounter (Signed)
 atorvastatin  (LIPITOR) 40 MG tablet [Pharmacy Med Name: Atorvastatin  Calcium  40MG  TABS]      esomeprazole  (NEXIUM ) 40 MG capsule [Pharmacy Med Name: Esomeprazole  Magnesium  40MG  CPDR]      JARDIANCE  25 MG TABS tablet [Pharmacy Med Name: Jardiance  25MG  TABS]      metFORMIN  (GLUCOPHAGE ) 1000 MG tablet [Pharmacy Med Name: metFORMIN  HCl 1000MG  TABS*]

## 2025-01-19 ENCOUNTER — Ambulatory Visit: Admitting: Podiatry

## 2025-02-05 ENCOUNTER — Ambulatory Visit: Payer: Self-pay | Admitting: Nurse Practitioner
